# Patient Record
Sex: Male | Born: 1940 | Race: Black or African American | Hispanic: No | Marital: Married | Smoking: Never smoker
Health system: Southern US, Community
[De-identification: ages and names within clinical notes are randomized; demographics above are authoritative.]

## PROBLEM LIST (undated history)

## (undated) DIAGNOSIS — M1990 Unspecified osteoarthritis, unspecified site: Secondary | ICD-10-CM

## (undated) DIAGNOSIS — N289 Disorder of kidney and ureter, unspecified: Secondary | ICD-10-CM

## (undated) DIAGNOSIS — F419 Anxiety disorder, unspecified: Secondary | ICD-10-CM

## (undated) DIAGNOSIS — E119 Type 2 diabetes mellitus without complications: Secondary | ICD-10-CM

## (undated) DIAGNOSIS — I10 Essential (primary) hypertension: Secondary | ICD-10-CM

## (undated) DIAGNOSIS — J189 Pneumonia, unspecified organism: Secondary | ICD-10-CM

## (undated) DIAGNOSIS — F32A Depression, unspecified: Secondary | ICD-10-CM

## (undated) DIAGNOSIS — N186 End stage renal disease: Secondary | ICD-10-CM

---

## 2007-01-10 ENCOUNTER — Ambulatory Visit: Payer: Self-pay

## 2007-03-31 ENCOUNTER — Emergency Department: Payer: Self-pay | Admitting: Emergency Medicine

## 2007-04-01 ENCOUNTER — Emergency Department: Payer: Self-pay | Admitting: Emergency Medicine

## 2010-03-26 ENCOUNTER — Ambulatory Visit: Payer: Self-pay | Admitting: Gastroenterology

## 2012-01-26 ENCOUNTER — Ambulatory Visit: Payer: Self-pay | Admitting: Nephrology

## 2012-03-16 ENCOUNTER — Ambulatory Visit: Payer: Self-pay | Admitting: Gastroenterology

## 2012-03-23 ENCOUNTER — Ambulatory Visit: Payer: Self-pay | Admitting: Cardiology

## 2012-05-06 ENCOUNTER — Emergency Department: Payer: Self-pay | Admitting: Emergency Medicine

## 2012-05-06 LAB — BASIC METABOLIC PANEL
Anion Gap: 7 (ref 7–16)
Calcium, Total: 9.5 mg/dL (ref 8.5–10.1)
Chloride: 106 mmol/L (ref 98–107)
Creatinine: 2.09 mg/dL — ABNORMAL HIGH (ref 0.60–1.30)
EGFR (African American): 36 — ABNORMAL LOW
EGFR (Non-African Amer.): 31 — ABNORMAL LOW
Glucose: 112 mg/dL — ABNORMAL HIGH (ref 65–99)
Osmolality: 286 (ref 275–301)
Sodium: 139 mmol/L (ref 136–145)

## 2012-05-06 LAB — CBC WITH DIFFERENTIAL/PLATELET
Basophil #: 0.1 10*3/uL (ref 0.0–0.1)
Basophil %: 1.3 %
Eosinophil #: 0.2 10*3/uL (ref 0.0–0.7)
Lymphocyte #: 0.8 10*3/uL — ABNORMAL LOW (ref 1.0–3.6)
MCHC: 32.5 g/dL (ref 32.0–36.0)
MCV: 83 fL (ref 80–100)
Monocyte %: 8.3 %
Neutrophil %: 74.1 %
RDW: 15.3 % — ABNORMAL HIGH (ref 11.5–14.5)
WBC: 5.8 10*3/uL (ref 3.8–10.6)

## 2013-05-17 ENCOUNTER — Ambulatory Visit: Payer: Self-pay | Admitting: Gastroenterology

## 2015-03-18 ENCOUNTER — Emergency Department: Payer: Medicare PPO

## 2015-03-18 ENCOUNTER — Emergency Department
Admission: EM | Admit: 2015-03-18 | Discharge: 2015-03-18 | Disposition: A | Discharge status: Home / Self Care | Payer: Medicare PPO | Attending: Emergency Medicine | Admitting: Emergency Medicine

## 2015-03-18 ENCOUNTER — Encounter: Payer: Self-pay | Admitting: Emergency Medicine

## 2015-03-18 DIAGNOSIS — R140 Abdominal distension (gaseous): Secondary | ICD-10-CM

## 2015-03-18 DIAGNOSIS — E119 Type 2 diabetes mellitus without complications: Secondary | ICD-10-CM | POA: Diagnosis not present

## 2015-03-18 DIAGNOSIS — I10 Essential (primary) hypertension: Secondary | ICD-10-CM | POA: Diagnosis not present

## 2015-03-18 DIAGNOSIS — R109 Unspecified abdominal pain: Secondary | ICD-10-CM | POA: Diagnosis present

## 2015-03-18 DIAGNOSIS — K5900 Constipation, unspecified: Secondary | ICD-10-CM | POA: Diagnosis not present

## 2015-03-18 DIAGNOSIS — R1084 Generalized abdominal pain: Secondary | ICD-10-CM

## 2015-03-18 DIAGNOSIS — R110 Nausea: Secondary | ICD-10-CM | POA: Insufficient documentation

## 2015-03-18 HISTORY — DX: Essential (primary) hypertension: I10

## 2015-03-18 HISTORY — DX: Type 2 diabetes mellitus without complications: E11.9

## 2015-03-18 HISTORY — DX: Disorder of kidney and ureter, unspecified: N28.9

## 2015-03-18 LAB — COMPREHENSIVE METABOLIC PANEL
ALK PHOS: 54 U/L (ref 38–126)
ALT: 14 U/L — AB (ref 17–63)
AST: 19 U/L (ref 15–41)
Albumin: 4.2 g/dL (ref 3.5–5.0)
Anion gap: 8 (ref 5–15)
BUN: 36 mg/dL — AB (ref 6–20)
CHLORIDE: 112 mmol/L — AB (ref 101–111)
CO2: 22 mmol/L (ref 22–32)
CREATININE: 2.19 mg/dL — AB (ref 0.61–1.24)
Calcium: 9.6 mg/dL (ref 8.9–10.3)
GFR calc Af Amer: 32 mL/min — ABNORMAL LOW (ref >60)
GFR, EST NON AFRICAN AMERICAN: 28 mL/min — AB (ref >60)
Glucose, Bld: 130 mg/dL — ABNORMAL HIGH (ref 65–99)
Potassium: 3.8 mmol/L (ref 3.5–5.1)
Sodium: 142 mmol/L (ref 135–145)
Total Bilirubin: 0.7 mg/dL (ref 0.3–1.2)
Total Protein: 7.5 g/dL (ref 6.5–8.1)

## 2015-03-18 LAB — CBC
HCT: 35.2 % — ABNORMAL LOW (ref 40.0–52.0)
Hemoglobin: 11.3 g/dL — ABNORMAL LOW (ref 13.0–18.0)
MCH: 26.4 pg (ref 26.0–34.0)
MCHC: 32.2 g/dL (ref 32.0–36.0)
MCV: 82 fL (ref 80.0–100.0)
PLATELETS: 148 10*3/uL — AB (ref 150–440)
RBC: 4.29 MIL/uL — ABNORMAL LOW (ref 4.40–5.90)
RDW: 15.3 % — AB (ref 11.5–14.5)
WBC: 4.6 10*3/uL (ref 3.8–10.6)

## 2015-03-18 LAB — URINALYSIS COMPLETE WITH MICROSCOPIC (ARMC ONLY)
BILIRUBIN URINE: NEGATIVE
Bacteria, UA: NONE SEEN
GLUCOSE, UA: NEGATIVE mg/dL
Hgb urine dipstick: NEGATIVE
Ketones, ur: NEGATIVE mg/dL
Leukocytes, UA: NEGATIVE
Nitrite: NEGATIVE
Protein, ur: NEGATIVE mg/dL
Specific Gravity, Urine: 1.012 (ref 1.005–1.030)
pH: 5 (ref 5.0–8.0)

## 2015-03-18 LAB — LIPASE, BLOOD: LIPASE: 24 U/L (ref 11–51)

## 2015-03-18 LAB — LACTIC ACID, PLASMA: Lactic Acid, Venous: 0.7 mmol/L (ref 0.5–2.0)

## 2015-03-18 MED ORDER — METOCLOPRAMIDE HCL 10 MG PO TABS: 10.0000 mg | ORAL_TABLET | Freq: Four times a day (QID) | ORAL | Status: DC | PRN

## 2015-03-18 MED ORDER — IOHEXOL 240 MG/ML SOLN
25.0000 mL | INTRAMUSCULAR | Status: AC
  Administered 2015-03-18: 25 mL via ORAL

## 2015-03-18 MED ORDER — GI COCKTAIL ~~LOC~~
30.0000 mL | Freq: Once | ORAL | Status: AC
  Administered 2015-03-18: 30 mL via ORAL
  Filled 2015-03-18: qty 30

## 2015-03-18 MED ORDER — SODIUM CHLORIDE 0.9 % IV BOLUS (SEPSIS)
1000.0000 mL | Freq: Once | INTRAVENOUS | Status: AC
  Administered 2015-03-18: 1000 mL via INTRAVENOUS

## 2015-03-18 MED ORDER — DOCUSATE SODIUM 100 MG PO CAPS: 100.0000 mg | ORAL_CAPSULE | Freq: Every day | ORAL | Status: AC | PRN

## 2015-03-18 NOTE — ED Notes (Signed)
Warm blankets provided. Call bell at side, pt denies further needs at this time.

## 2015-03-18 NOTE — ED Notes (Signed)
Pt resting in bed, wife at the bedside, call bell in reach. Pt requests his daughter be called to get medication list.

## 2015-03-18 NOTE — ED Notes (Signed)
Pt states pain improving since triage. Pt states "i just feel bloated, like i need to pass something, maybe gas but can't."

## 2015-03-18 NOTE — ED Notes (Signed)
Pt up to restroom.

## 2015-03-18 NOTE — ED Notes (Signed)
Pt has finished ct contrast. Ct notified, pt continues to deny pain, states "i just feel bloated."

## 2015-03-18 NOTE — ED Notes (Signed)
Patient ambulatory to triage with steady gait, without difficulty or distress noted; pt reports last few days having lower abd pain with no accomp symptoms

## 2015-03-18 NOTE — Discharge Instructions (Signed)
Abdominal Pain, Adult °Many things can cause abdominal pain. Usually, abdominal pain is not caused by a disease and will improve without treatment. It can often be observed and treated at home. Your health care provider will do a physical exam and possibly order blood tests and X-rays to help determine the seriousness of your pain. However, in many cases, more time must pass before a clear cause of the pain can be found. Before that point, your health care provider may not know if you need more testing or further treatment. °HOME CARE INSTRUCTIONS °Monitor your abdominal pain for any changes. The following actions may help to alleviate any discomfort you are experiencing: °· Only take over-the-counter or prescription medicines as directed by your health care provider. °· Do not take laxatives unless directed to do so by your health care provider. °· Try a clear liquid diet (broth, tea, or water) as directed by your health care provider. Slowly move to a bland diet as tolerated. °SEEK MEDICAL CARE IF: °· You have unexplained abdominal pain. °· You have abdominal pain associated with nausea or diarrhea. °· You have pain when you urinate or have a bowel movement. °· You experience abdominal pain that wakes you in the night. °· You have abdominal pain that is worsened or improved by eating food. °· You have abdominal pain that is worsened with eating fatty foods. °· You have a fever. °SEEK IMMEDIATE MEDICAL CARE IF: °· Your pain does not go away within 2 hours. °· You keep throwing up (vomiting). °· Your pain is felt only in portions of the abdomen, such as the right side or the left lower portion of the abdomen. °· You pass bloody or black tarry stools. °MAKE SURE YOU: °· Understand these instructions. °· Will watch your condition. °· Will get help right away if you are not doing well or get worse. °  °This information is not intended to replace advice given to you by your health care provider. Make sure you discuss  any questions you have with your health care provider. °  °Document Released: 10/08/2004 Document Revised: 09/19/2014 Document Reviewed: 09/07/2012 °Elsevier Interactive Patient Education ©2016 [employer] ° °Constipation, Adult °Constipation is when a person has fewer than three bowel movements a week, has difficulty having a bowel movement, or has stools that are dry, hard, or larger than normal. As people grow older, constipation is more common. A low-fiber diet, not taking in enough fluids, and taking certain medicines may make constipation worse.  °CAUSES  °· Certain medicines, such as antidepressants, pain medicine, iron supplements, antacids, and water pills.   °· Certain diseases, such as diabetes, irritable bowel syndrome (IBS), thyroid disease, or depression.   °· Not drinking enough water.   °· Not eating enough fiber-rich foods.   °· Stress or travel.   °· Lack of physical activity or exercise.   °· Ignoring the urge to have a bowel movement.   °· Using laxatives too much.   °SIGNS AND SYMPTOMS  °· Having fewer than three bowel movements a week.   °· Straining to have a bowel movement.   °· Having stools that are hard, dry, or larger than normal.   °· Feeling full or bloated.   °· Pain in the lower abdomen.   °· Not feeling relief after having a bowel movement.   °DIAGNOSIS  °Your health care provider will take a medical history and perform a physical exam. Further testing may be done for severe constipation. Some tests may include: °· A barium enema X-ray to examine your rectum, colon, and, sometimes,   your small intestine.   °· A sigmoidoscopy to examine your lower colon.   °· A colonoscopy to examine your entire colon. °TREATMENT  °Treatment will depend on the severity of your constipation and what is causing it. Some dietary treatments include drinking more fluids and eating more fiber-rich foods. Lifestyle treatments may include regular exercise. If these diet and lifestyle recommendations do not  help, your health care provider may recommend taking over-the-counter laxative medicines to help you have bowel movements. Prescription medicines may be prescribed if over-the-counter medicines do not work.  °HOME CARE INSTRUCTIONS  °· Eat foods that have a lot of fiber, such as fruits, vegetables, whole grains, and beans. °· Limit foods high in fat and processed sugars, such as french fries, hamburgers, cookies, candies, and soda.   °· A fiber supplement may be added to your diet if you cannot get enough fiber from foods.   °· Drink enough fluids to keep your urine clear or pale yellow.   °· Exercise regularly or as directed by your health care provider.   °· Go to the restroom when you have the urge to go. Do not hold it.   °· Only take over-the-counter or prescription medicines as directed by your health care provider. Do not take other medicines for constipation without talking to your health care provider first.   °SEEK IMMEDIATE MEDICAL CARE IF:  °· You have bright red blood in your stool.   °· Your constipation lasts for more than 4 days or gets worse.   °· You have abdominal or rectal pain.   °· You have thin, pencil-like stools.   °· You have unexplained weight loss. °MAKE SURE YOU:  °· Understand these instructions. °· Will watch your condition. °· Will get help right away if you are not doing well or get worse. °  °This information is not intended to replace advice given to you by your health care provider. Make sure you discuss any questions you have with your health care provider. °  °Document Released: 09/27/2003 Document Revised: 01/19/2014 Document Reviewed: 10/10/2012 °Elsevier Interactive Patient Education ©2016 Elsevier Inc. ° °

## 2015-03-18 NOTE — ED Provider Notes (Signed)
[facility] Emergency Department Provider Note  ____________________________________________  Time seen: Approximately [id number] AM  I have reviewed the triage vital signs and the nursing notes.   HISTORY  Chief Complaint Abdominal Pain    HPI Jeffery Joseph is a 75 y.o. male who comes in today with some abdominal discomfort. The patient were to the stomach has been bothering him for the last day or 2. He has had some hard bowels as well for the same amount of time. The patient is taking some medication he reports to help decrease his potassium and he is unsure if that is causing his symptoms. He reports he does have some difficulty passing gas and reports this pain is a 10 out of 10 in intensity. When asked to describe that pain he said a simple discomfort and is not severe pain. He just feels uncomfortable and nauseous. The patient denies any vomiting or fevers. He reports he is always had a bad stomach. The patient reports that he was so uncomfortable he decided to come into the hospital for evaluation.The patient reports that the pain is in his mid abdomen.    Past Medical History  Diagnosis Date  . Hypertension   . Diabetes mellitus without complication ([place])   . Renal disorder     There are no active problems to display for this patient.   History reviewed. No pertinent past surgical history.  Current Outpatient Rx  Name  Route  Sig  Dispense  Refill  . docusate sodium (COLACE) 100 MG capsule   Oral   Take 1 capsule (100 mg total) by mouth daily as needed.   15 capsule   0   . metoCLOPramide (REGLAN) 10 MG tablet   Oral   Take 1 tablet (10 mg total) by mouth every 6 (six) hours as needed for nausea.   15 tablet   0     Allergies Review of patient's allergies indicates no known allergies.  No family history on file.  Social History Social History  Substance Use Topics  . Smoking status: Never Smoker   . Smokeless tobacco: None  . Alcohol  Use: No    Review of Systems Constitutional: No fever/chills Eyes: No visual changes. ENT: No sore throat. Cardiovascular: Denies chest pain. Respiratory: Denies shortness of breath. Gastrointestinal:  abdominal pain with nausea and constipation Genitourinary: Negative for dysuria. Musculoskeletal: Negative for back pain. Skin: Negative for rash. Neurological: Negative for headaches, focal weakness or numbness.  10-point ROS otherwise negative.  ____________________________________________   PHYSICAL EXAM:  VITAL SIGNS: ED Triage Vitals  Enc Vitals Group     BP 03/18/15 0338 163/61 mmHg     Pulse Rate 03/18/15 0338 67     Resp 03/18/15 0338 20     Temp 03/18/15 0338 98.1 F (36.7 C)     Temp Source 03/18/15 0338 Oral     SpO2 03/18/15 0338 100 %     Weight 03/18/15 0338 230 lb (104.327 kg)     Height 03/18/15 0338 6\' 5"  (1.956 m)     Head Cir --      Peak Flow --      Pain Score 03/18/15 0337 10     Pain Loc --      Pain Edu? --      Excl. in [place]? --     Constitutional: Alert and oriented. Well appearing and in mild distress. Eyes: Conjunctivae are normal. PERRL. EOMI. Head: Atraumatic. Nose: No congestion/rhinnorhea. Mouth/Throat: Mucous membranes  are moist.  Oropharynx non-erythematous. Cardiovascular: Normal rate, regular rhythm. Grossly normal heart sounds.  Good peripheral circulation. Respiratory: Normal respiratory effort.  No retractions. Lungs CTAB. Gastrointestinal: Soft and nontender. No distention. Positive bowel sounds. Musculoskeletal: No lower extremity tenderness nor edema.   Neurologic:  Normal speech and language.  Skin:  Skin is warm, dry and intact.  Psychiatric: Mood and affect are normal.   ____________________________________________   LABS (all labs ordered are listed, but only abnormal results are displayed)  Labs Reviewed  COMPREHENSIVE METABOLIC PANEL - Abnormal; Notable for the following:    Chloride 112 (*)    Glucose, Bld 130  (*)    BUN 36 (*)    Creatinine, Ser 2.19 (*)    ALT 14 (*)    GFR calc non Af Amer 28 (*)    GFR calc Af Amer 32 (*)    All other components within normal limits  CBC - Abnormal; Notable for the following:    RBC 4.29 (*)    Hemoglobin 11.3 (*)    HCT 35.2 (*)    RDW 15.3 (*)    Platelets 148 (*)    All other components within normal limits  URINALYSIS COMPLETEWITH MICROSCOPIC ([facility] ONLY) - Abnormal; Notable for the following:    Color, Urine STRAW (*)    APPearance CLEAR (*)    Squamous Epithelial / LPF 0-5 (*)    All other components within normal limits  LIPASE, BLOOD  LACTIC ACID, PLASMA   ____________________________________________  EKG  None ____________________________________________  RADIOLOGY  CT Abdomen and Pelvis: 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 Solid renal lesions cannot be excluded. Consider follow up with MRI in the elective setting. There is a small follow up with MRI in the elective setting. There is a small umbilical hernia containing bowel but without proximal obstruction. ____________________________________________   PROCEDURES  Procedure(s) performed: None  Critical Care performed: No  ____________________________________________   INITIAL IMPRESSION / ASSESSMENT AND PLAN / ED COURSE  Pertinent labs & imaging results that were available during my care of the patient were reviewed by me and considered in my medical decision making (see chart for details).  This is a 75 year old male who comes into the hospital today with some abdominal discomfort. He reports he feels as though he needs to pass gas but he is not having some severe pain. I did perform a CT scan that only shows an umbilical hernia without any acute process. I will give the patient a GI cocktail and reassess his discomfort.  The patient reports that he feels bloated but he is not having any pain at this time. I feel the patient's  symptoms is due to likely his medication but also some constipation. I will give the patient a prescription for Colace as well as for Reglan to help improve his GI transit time. The patient will be discharged home to follow-up with his primary care physician. ____________________________________________   FINAL CLINICAL IMPRESSION(S) / ED DIAGNOSES  Final diagnoses:  Generalized abdominal pain  Abdominal bloating  Constipation, unspecified constipation type      Loney Hering, MD 03/18/15 [phone]

## 2015-03-18 NOTE — ED Notes (Signed)
Pt sipping po contrast.

## 2015-03-18 NOTE — ED Notes (Signed)
Report to tiffany, rn. 

## 2015-03-18 NOTE — ED Notes (Addendum)
Pt states bilateral lower quadrant pain for "days" that is worse since yesterday. Pt states has had nausea. Pt states last bowel movement was yesterday and "hard". Pt denies known fever, abd is non tender to palpation. Pt states is passing flatus. Pt denies dysuria, hematuria or foul odor to urine. Cms intact to lower extremities. No pulsitile mass noted to abd.

## 2017-04-06 ENCOUNTER — Telehealth: Payer: Self-pay | Admitting: Infectious Diseases

## 2017-04-06 NOTE — Telephone Encounter (Signed)
error 

## 2017-04-25 IMAGING — CT CT ABD-PELV W/O CM
1 of 2 series · 15 of 32 positions shown, 19 images · non-contrast
Comparison: 01/26/2012

CLINICAL DATA: Bilateral lower quadrant pain for days, worse since
yesterday. Nausea.

EXAM:
CT ABDOMEN AND PELVIS WITHOUT CONTRAST
TECHNIQUE: Multidetector CT imaging of the abdomen and pelvis was performed
following the standard protocol without IV contrast.

[Series 2: routine abd pel without · axial · non-contrast · 0.82mm/px · z∈[-568,-103]mm · 15 of 103 slices shown, 19 images]
[im 5/103  soft-tissue]
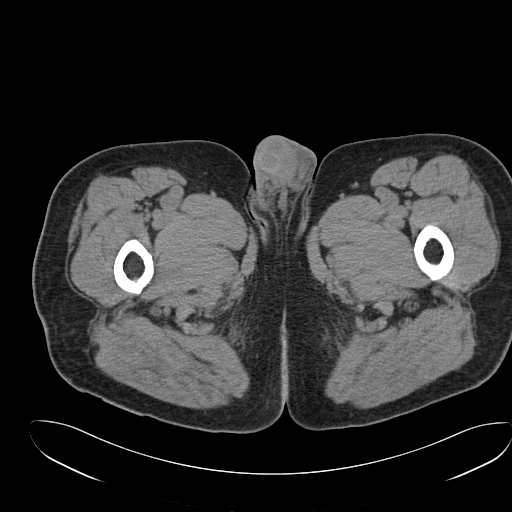
[im 5/103  bone]
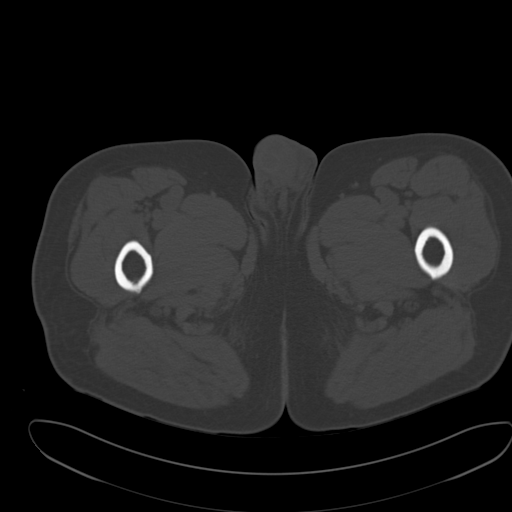
[im 13/103  soft-tissue]
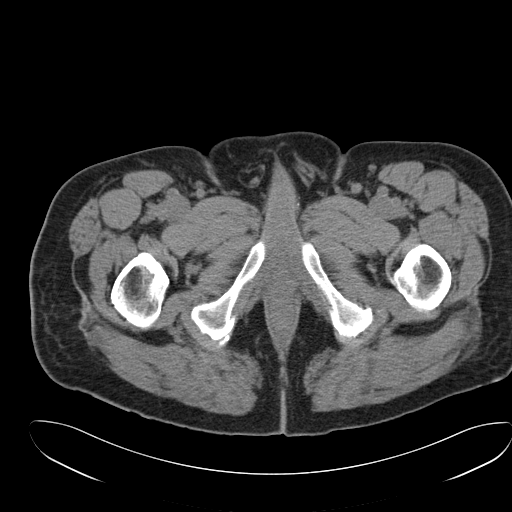
[im 21/103  soft-tissue]
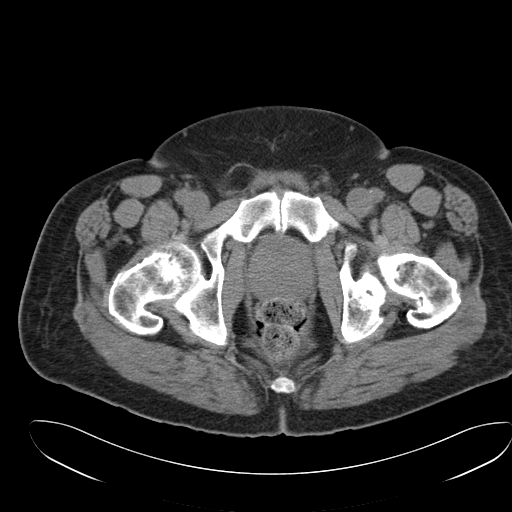
[im 29/103  soft-tissue]
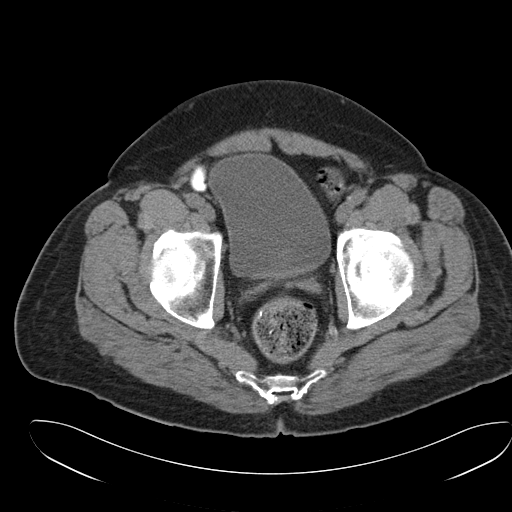
[im 37/103  soft-tissue]
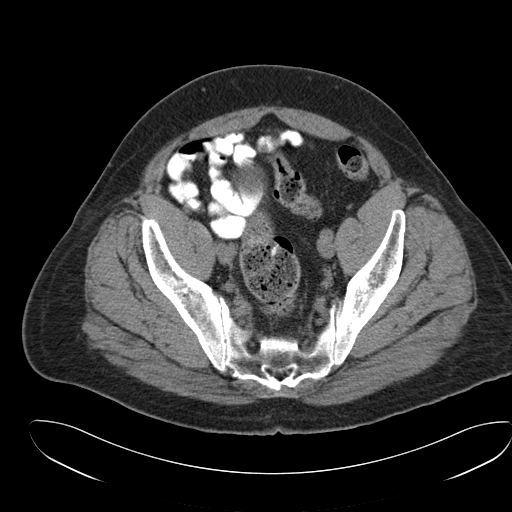
[im 45/103  soft-tissue]
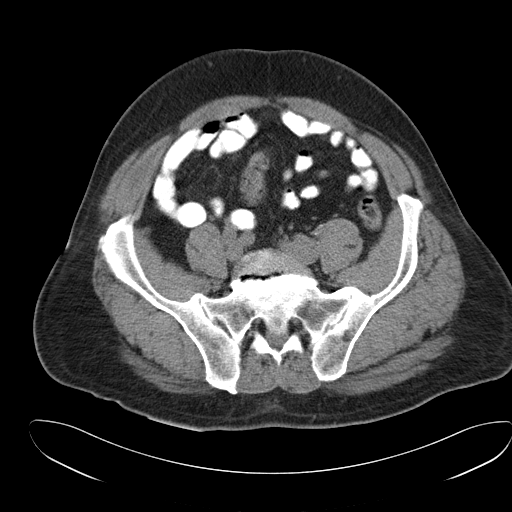
[im 54/103  soft-tissue]
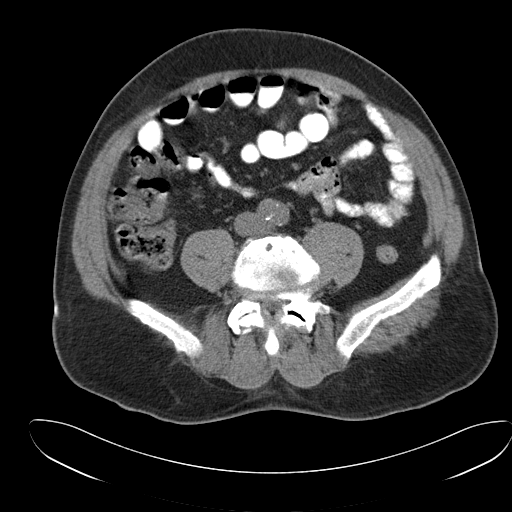
[im 58/103  soft-tissue]
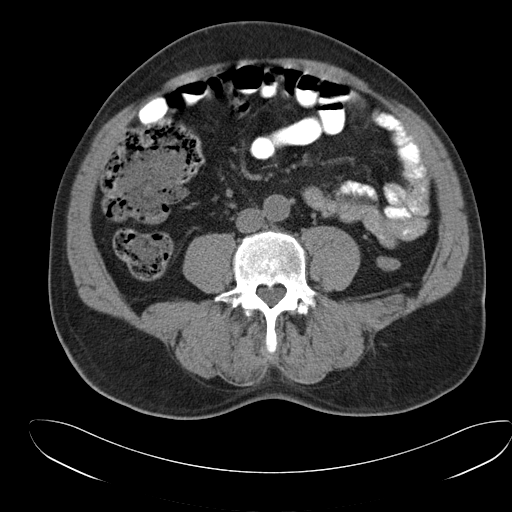
[im 66/103  soft-tissue]
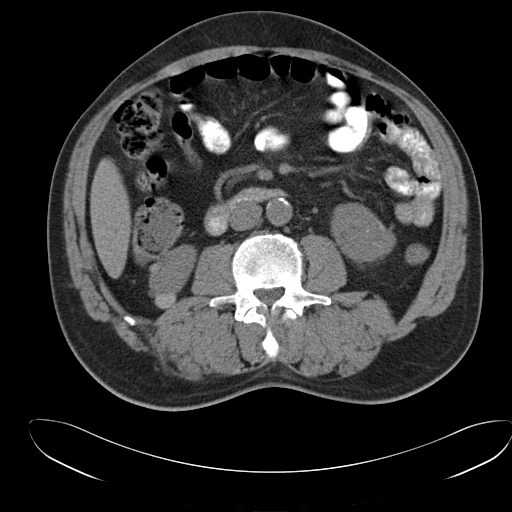
[im 66/103  bone]
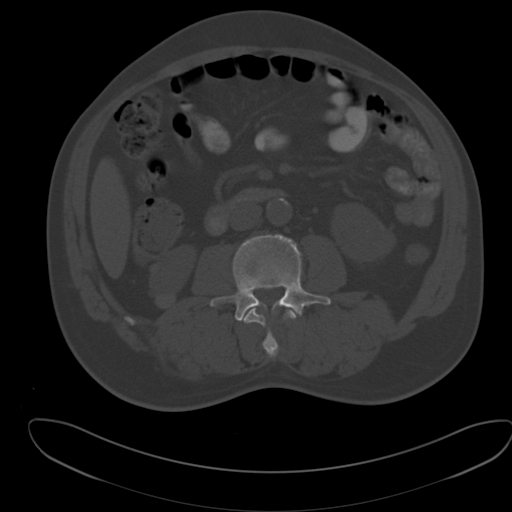
[im 74/103  soft-tissue]
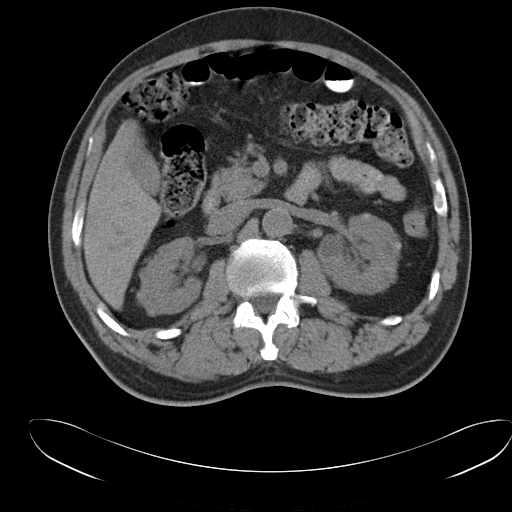
[im 82/103  soft-tissue]
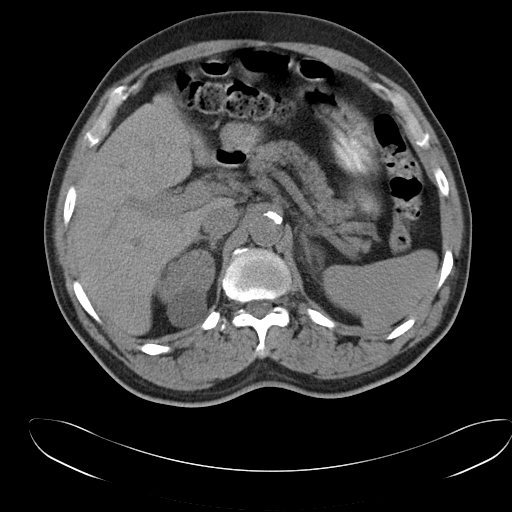
[im 86/103  lung]
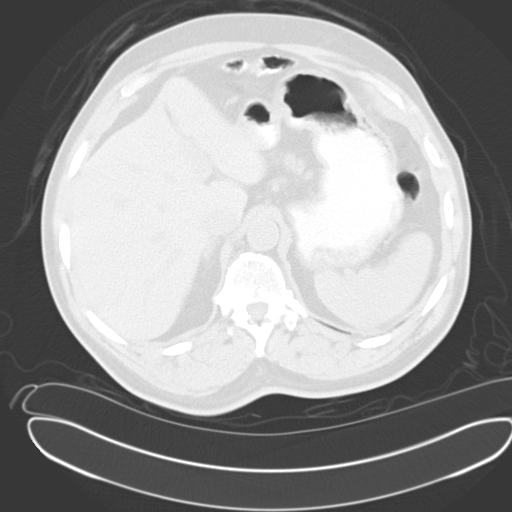
[im 90/103  soft-tissue]
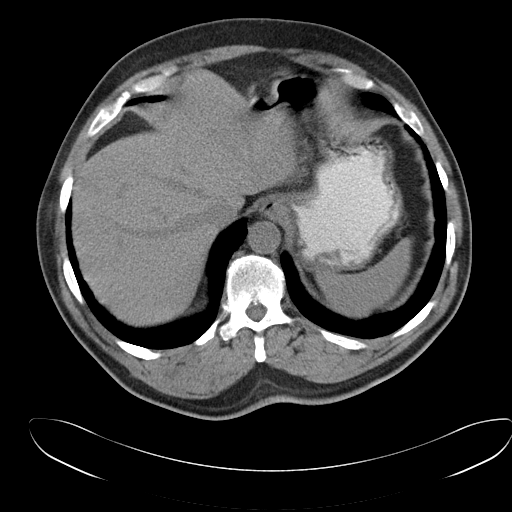
[im 90/103  lung]
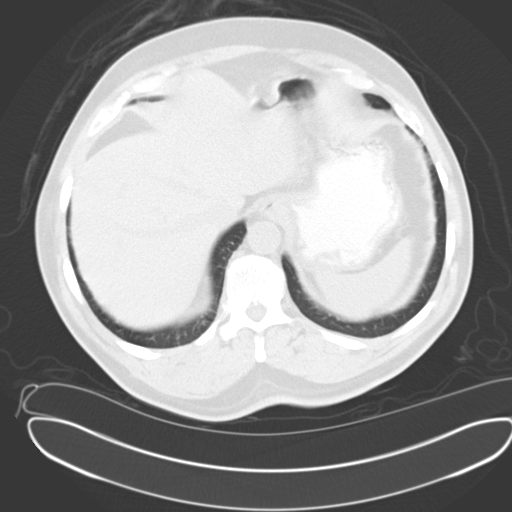
[im 94/103  lung]
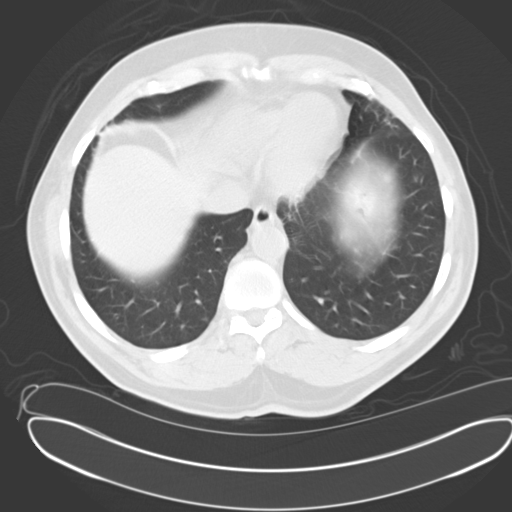
[im 98/103  soft-tissue]
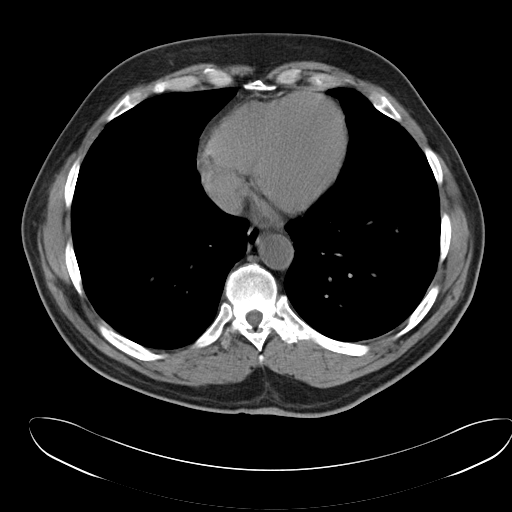
[im 98/103  lung]
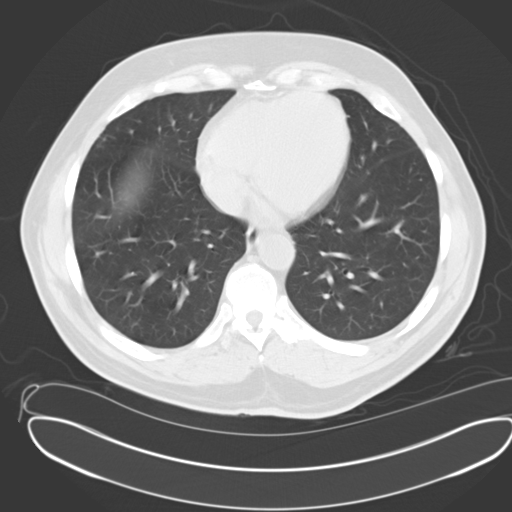

[15 of 32 positions shown; findings below may reference images not displayed]

FINDINGS: The lung bases are clear.  Coronary artery calcifications.

Evaluation of solid organs and vascular structures is limited
without IV contrast material.

The unenhanced appearance of the liver, spleen, gallbladder,
pancreas, adrenal glands, abdominal aorta, inferior vena cava, and
retroperitoneal lymph nodes is unremarkable. There are multiple
bilateral renal mass lesions, with varying densities, likely
representing cysts and hemorrhagic cysts. A larger cyst previously
demonstrated in the left kidney has deflated since the previous
study. Solid mass lesions in the kidneys cannot be excluded.
Consider MRI for further evaluation in the elective setting. No
hydronephrosis.

Stomach and small bowel are unremarkable. Scattered stool in the
colon. No colonic distention or wall thickening. No free air or free
fluid in the abdomen.

Pelvis: Small umbilical hernia containing bowel but without proximal
obstruction. Bladder wall is not thickened. Diffuse enlargement of
prostate gland, measuring 6 cm diameter. Small right inguinal hernia
containing fat. Appendix is normal. Degenerative changes in the
lumbar spine and hips. No destructive bone lesions.
IMPRESSION: Multiple bilateral renal mass lesions likely representing cysts and
hemorrhagic cysts. Solid renal lesions cannot be excluded. Consider
follow-up with MRI in the elective setting. There is a small
umbilical hernia containing bowel but without proximal obstruction.

## 2018-10-27 DIAGNOSIS — I10 Essential (primary) hypertension: Secondary | ICD-10-CM | POA: Insufficient documentation

## 2019-01-03 ENCOUNTER — Ambulatory Visit: Payer: Medicare PPO | Attending: Internal Medicine

## 2019-01-03 DIAGNOSIS — Z20822 Contact with and (suspected) exposure to covid-19: Secondary | ICD-10-CM

## 2019-01-05 LAB — NOVEL CORONAVIRUS, NAA: SARS-CoV-2, NAA: NOT DETECTED

## 2019-02-20 ENCOUNTER — Ambulatory Visit: Payer: Medicare PPO

## 2019-08-11 ENCOUNTER — Ambulatory Visit (INDEPENDENT_AMBULATORY_CARE_PROVIDER_SITE_OTHER): Payer: Medicare PPO | Admitting: Urology

## 2019-08-11 ENCOUNTER — Encounter: Payer: Self-pay | Admitting: Urology

## 2019-08-11 ENCOUNTER — Other Ambulatory Visit: Payer: Self-pay

## 2019-08-11 VITALS — BP 153/56 | HR 66 | Ht 77.0 in | Wt 243.2 lb

## 2019-08-11 DIAGNOSIS — N400 Enlarged prostate without lower urinary tract symptoms: Secondary | ICD-10-CM

## 2019-08-11 DIAGNOSIS — N401 Enlarged prostate with lower urinary tract symptoms: Secondary | ICD-10-CM

## 2019-08-11 LAB — URINALYSIS, COMPLETE
Bilirubin, UA: NEGATIVE
Glucose, UA: NEGATIVE
Ketones, UA: NEGATIVE
Leukocytes,UA: NEGATIVE
Nitrite, UA: NEGATIVE
RBC, UA: NEGATIVE
Specific Gravity, UA: 1.015 (ref 1.005–1.030)
Urobilinogen, Ur: 0.2 mg/dL (ref 0.2–1.0)
pH, UA: 5.5 (ref 5.0–7.5)

## 2019-08-11 LAB — MICROSCOPIC EXAMINATION: Bacteria, UA: NONE SEEN

## 2019-08-11 LAB — BLADDER SCAN AMB NON-IMAGING: Scan Result: 52

## 2019-08-12 ENCOUNTER — Encounter: Payer: Self-pay | Admitting: Urology

## 2019-08-12 DIAGNOSIS — N401 Enlarged prostate with lower urinary tract symptoms: Secondary | ICD-10-CM | POA: Insufficient documentation

## 2019-08-12 MED ORDER — TAMSULOSIN HCL 0.4 MG PO CAPS: 0.4000 mg | ORAL_CAPSULE | Freq: Every day | ORAL | 3 refills | Status: DC

## 2019-08-12 MED ORDER — FINASTERIDE 5 MG PO TABS: 5.0000 mg | ORAL_TABLET | Freq: Every day | ORAL | 3 refills | Status: DC

## 2019-08-12 NOTE — Progress Notes (Signed)
   08/11/2019 10:52 PM   MAJD TISSUE 04-Aug-1940 [id number]  Primary care provider: Leonel Ramsay, MD [place],  [place] [address]  Chief Complaint  Patient presents with  . Benign Prostatic Hypertrophy    HPI: Jeffery Joseph is a 79 y.o. male who presents to establish local urologic care.   Previously followed by Dr. Jacqlyn Larsen in [place] and [facility] Dr. Wynetta Emery in October 2020  History of transient elevated PSA which Dr. Jacqlyn Larsen was following annually and Dr. Wynetta Emery recommended discontinuing screening since PSA has been stable for 5 years  Stable lower urinary tract symptoms which are not bothersome  On finasteride and tamsulosin  Denies dysuria or gross hematuria  Denies flank, abdominal or pelvic pain    PMH: Past Medical History:  Diagnosis Date  . Diabetes mellitus without complication ([place])   . Hypertension   . Renal disorder     Surgical History: History reviewed. No pertinent surgical history.  Home Medications:  Allergies as of 08/11/2019   No Known Allergies     Medication List       Accurate as of August 11, 2019 11:59 PM. If you have any questions, ask your nurse or doctor.        amLODipine 10 MG tablet Commonly known as: NORVASC Take 10 mg by mouth daily.   cholecalciferol 1000 units tablet Commonly known as: VITAMIN D Take 2,000 Units by mouth daily.   clindamycin-tretinoin gel Commonly known as: ZIANA Apply topically at bedtime.   finasteride 5 MG tablet Commonly known as: PROSCAR Take 5 mg by mouth daily.   hydrALAZINE 50 MG tablet Commonly known as: APRESOLINE Take 50 mg by mouth 3 (three) times daily.   losartan 50 MG tablet Commonly known as: COZAAR Take 50 mg by mouth 2 (two) times daily.   metoCLOPramide 10 MG tablet Commonly known as: REGLAN Take 1 tablet (10 mg total) by mouth every 6 (six) hours as needed for nausea.   oxazepam 10 MG capsule Commonly known as: SERAX Take 10 mg by mouth 2  (two) times daily.   tamsulosin 0.4 MG Caps capsule Commonly known as: FLOMAX Take 0.4 mg by mouth daily.       Allergies: No Known Allergies  Family History: History reviewed. No pertinent family history.  Social History:  reports that he has never smoked. He has never used smokeless tobacco. He reports that he does not drink alcohol. No history on file for drug use.   Physical Exam: BP (!) 153/56   Pulse 66   Ht 6\' 5"  (1.956 m)   Wt (!) 243 lb 3.2 oz (110.3 kg)   BMI 28.84 kg/m   Constitutional:  Alert and oriented, No acute distress. HEENT: [place] AT, moist mucus membranes.  Trachea midline, no masses. Cardiovascular: No clubbing, cyanosis, or edema. Respiratory: Normal respiratory effort, no increased work of breathing. GI: Abdomen is soft, nontender, nondistended, no abdominal masses GU: Prostate 50 g, smooth without nodules Lymph: No cervical or inguinal lymphadenopathy. Skin: No rashes, bruises or suspicious lesions. Neurologic: Grossly intact, no focal deficits, moving all 4 extremities. Psychiatric: Normal mood and affect.   Assessment & Plan:    1.  BPH with LUTS  Stable on tamsulosin/finasteride  Refill sent to pharmacy  PVR by bladder scan 52 mL  Continue annual follow-up   Abbie Sons, MD  [facility] [address] [place], [place] [address] [phone]

## 2020-08-12 ENCOUNTER — Ambulatory Visit: Payer: Self-pay | Admitting: Urology

## 2020-08-14 ENCOUNTER — Other Ambulatory Visit: Payer: Self-pay

## 2020-08-14 ENCOUNTER — Ambulatory Visit: Payer: Medicare PPO | Admitting: Urology

## 2020-08-14 ENCOUNTER — Encounter: Payer: Self-pay | Admitting: Urology

## 2020-08-14 VITALS — BP 176/71 | HR 51 | Ht 77.0 in | Wt 240.0 lb

## 2020-08-14 DIAGNOSIS — N401 Enlarged prostate with lower urinary tract symptoms: Secondary | ICD-10-CM | POA: Diagnosis not present

## 2020-08-14 LAB — BLADDER SCAN AMB NON-IMAGING: SCA Result: 157

## 2020-08-14 NOTE — Progress Notes (Signed)
08/14/2020 10:17 AM   Williemae Area 1940/01/30 [id number]  Referring provider: Leonel Ramsay, MD [place],  [place] [id number]  Chief Complaint  Patient presents with   Benign Prostatic Hypertrophy    Urologic history: 1.  BPH with LUTS Combination therapy tamsulosin/finasteride   HPI: 80 y.o. male presents for annual follow-up.  No problems since last years visit Remains on tamsulosin/finasteride Denies dysuria, gross hematuria Denies flank, abdominal or pelvic pain   PMH: Past Medical History:  Diagnosis Date   Diabetes mellitus without complication ([place])    Hypertension    Renal disorder     Surgical History: No past surgical history on file.  Home Medications:  Allergies as of 08/14/2020   No Known Allergies      Medication List        Accurate as of August 14, 2020 10:17 AM. If you have any questions, ask your nurse or doctor.          STOP taking these medications    clindamycin-tretinoin gel Commonly known as: ZIANA Stopped by: Abbie Sons, MD   losartan 50 MG tablet Commonly known as: COZAAR Stopped by: Abbie Sons, MD       TAKE these medications    amLODipine 10 MG tablet Commonly known as: NORVASC Take 10 mg by mouth daily.   cholecalciferol 1000 units tablet Commonly known as: VITAMIN D Take 2,000 Units by mouth daily.   finasteride 5 MG tablet Commonly known as: PROSCAR Take 1 tablet (5 mg total) by mouth daily.   hydrALAZINE 50 MG tablet Commonly known as: APRESOLINE Take 50 mg by mouth 3 (three) times daily.   metoCLOPramide 10 MG tablet Commonly known as: REGLAN Take 1 tablet (10 mg total) by mouth every 6 (six) hours as needed for nausea.   oxazepam 10 MG capsule Commonly known as: SERAX Take 10 mg by mouth 2 (two) times daily.   tamsulosin 0.4 MG Caps capsule Commonly known as: FLOMAX Take 1 capsule (0.4 mg total) by mouth daily.        Allergies: No Known  Allergies  Family History: No family history on file.  Social History:  reports that he has never smoked. He has never used smokeless tobacco. He reports that he does not drink alcohol. No history on file for drug use.   Physical Exam: BP (!) 176/71   Pulse (!) 51   Ht '6\' 5"'$  (1.956 m)   Wt 240 lb (108.9 kg)   BMI 28.46 kg/m   Constitutional:  Alert and oriented, No acute distress. HEENT: [place] AT, moist mucus membranes.  Trachea midline, no masses. Cardiovascular: No clubbing, cyanosis, or edema. Respiratory: Normal respiratory effort, no increased work of breathing. GI: Abdomen is soft, nontender, nondistended, no abdominal masses GU: Desires to go to every other year DRE   Assessment & Plan:    1. Benign prostatic hyperplasia with LUTS Stable voiding symptoms on tamsulosin/finasteride His medications are refilled by Dr. Ola Spurr Bladder scan PVR today 157 mL which is slightly increased from last year Continue annual follow-up  2.  Prostate cancer screening His daughter asked when his last PSA was checked and it was June 2019 We discussed the current prostate cancer screening guidelines which are an annual PSA/DRE between the ages of 72-69.  We discussed the low incidence of clinically significant prostate cancer after age 98  Abbie Sons, MD  [facility] [address], [place], [place]  [address] [phone]   

## 2020-10-21 ENCOUNTER — Other Ambulatory Visit: Payer: Self-pay | Admitting: Nephrology

## 2020-10-21 DIAGNOSIS — D631 Anemia in chronic kidney disease: Secondary | ICD-10-CM

## 2020-10-21 DIAGNOSIS — N189 Chronic kidney disease, unspecified: Secondary | ICD-10-CM

## 2020-10-21 DIAGNOSIS — N184 Chronic kidney disease, stage 4 (severe): Secondary | ICD-10-CM

## 2020-10-21 DIAGNOSIS — R809 Proteinuria, unspecified: Secondary | ICD-10-CM

## 2020-11-01 ENCOUNTER — Other Ambulatory Visit: Payer: Self-pay

## 2020-11-01 ENCOUNTER — Ambulatory Visit
Admission: RE | Admit: 2020-11-01 | Discharge: 2020-11-01 | Disposition: A | Discharge status: Home / Self Care | Payer: Medicare PPO | Source: Ambulatory Visit | Attending: Nephrology | Admitting: Nephrology

## 2020-11-01 DIAGNOSIS — N184 Chronic kidney disease, stage 4 (severe): Secondary | ICD-10-CM | POA: Diagnosis present

## 2020-11-01 DIAGNOSIS — N189 Chronic kidney disease, unspecified: Secondary | ICD-10-CM | POA: Diagnosis present

## 2020-11-01 DIAGNOSIS — R809 Proteinuria, unspecified: Secondary | ICD-10-CM | POA: Diagnosis present

## 2020-11-01 DIAGNOSIS — D631 Anemia in chronic kidney disease: Secondary | ICD-10-CM | POA: Diagnosis present

## 2021-08-14 ENCOUNTER — Encounter: Payer: Self-pay | Admitting: Urology

## 2021-08-14 ENCOUNTER — Ambulatory Visit: Payer: Medicare PPO | Admitting: Urology

## 2021-08-14 ENCOUNTER — Ambulatory Visit: Payer: Self-pay | Admitting: Urology

## 2021-08-14 VITALS — BP 151/63 | HR 53 | Ht 77.0 in | Wt 246.0 lb

## 2021-08-14 DIAGNOSIS — N401 Enlarged prostate with lower urinary tract symptoms: Secondary | ICD-10-CM

## 2021-08-14 LAB — BLADDER SCAN AMB NON-IMAGING: Scan Result: 45

## 2021-08-14 NOTE — Progress Notes (Signed)
   08/14/2021 1:10 PM   Jeffery Joseph February 11, 1940 [id number]  Referring provider: Leonel Ramsay, MD [place],  [place] [address]  Chief Complaint  Patient presents with   Benign Prostatic Hypertrophy    Urologic history: 1.  BPH with LUTS Combination therapy tamsulosin/finasteride   HPI: 81 y.o. male presents for annual follow-up.  No problems since last years visit Remains on tamsulosin/finasteride Denies dysuria, gross hematuria Denies flank, abdominal or pelvic pain   PMH: Past Medical History:  Diagnosis Date   Diabetes mellitus without complication ([facility])    Hypertension    Renal disorder     Surgical History: No past surgical history on file.  Home Medications:  Allergies as of 08/14/2021   No Known Allergies      Medication List        Accurate as of August 14, 2021  1:10 PM. If you have any questions, ask your nurse or doctor.          ALPRAZolam 0.5 MG tablet Commonly known as: XANAX Take by mouth.   amLODipine 10 MG tablet Commonly known as: NORVASC Take 10 mg by mouth daily.   cholecalciferol 1000 units tablet Commonly known as: VITAMIN D Take 2,000 Units by mouth daily.   enalapril 20 MG tablet Commonly known as: VASOTEC Take 20 mg by mouth 2 (two) times daily.   finasteride 5 MG tablet Commonly known as: PROSCAR Take 1 tablet (5 mg total) by mouth daily.   furosemide 40 MG tablet Commonly known as: LASIX Take by mouth.   glipiZIDE 5 MG 24 hr tablet Commonly known as: GLUCOTROL XL Take by mouth.   hydrALAZINE 50 MG tablet Commonly known as: APRESOLINE Take 50 mg by mouth 3 (three) times daily.   metoCLOPramide 10 MG tablet Commonly known as: REGLAN Take 1 tablet (10 mg total) by mouth every 6 (six) hours as needed for nausea.   metoprolol tartrate 25 MG tablet Commonly known as: LOPRESSOR Take 1 tablet by mouth 2 (two) times daily.   oxazepam 10 MG capsule Commonly known as: SERAX Take 10 mg  by mouth 2 (two) times daily.   tamsulosin 0.4 MG Caps capsule Commonly known as: FLOMAX Take 1 capsule (0.4 mg total) by mouth daily.        Allergies: No Known Allergies  Family History: No family history on file.  Social History:  reports that he has never smoked. He has never used smokeless tobacco. He reports that he does not drink alcohol. No history on file for drug use.   Physical Exam: BP (!) 151/63   Pulse (!) 53   Ht 6\' 5"  (1.956 m)   Wt 246 lb (111.6 kg)   BMI 29.17 kg/m   Constitutional:  Alert and oriented, No acute distress. HEENT: Man AT, moist mucus membranes.  Trachea midline, no masses. Cardiovascular: No clubbing, cyanosis, or edema. Respiratory: Normal respiratory effort, no increased work of breathing. GI: Abdomen is soft, nontender, nondistended, no abdominal masses GU: Desires to go to every other year D[facility]   Assessment & Plan:    1. Benign prostatic hyperplasia with LUTS Stable voiding symptoms on tamsulosin/finasteride His medications are refilled by Dr. Ola Spurr [facility] today 45 mL (157 mL last year) Follow-up 1 year with [facility]   Abbie Sons, MD  [facility] [address] [place], [place] [address] [phone]

## 2021-08-14 NOTE — Progress Notes (Signed)
b

## 2021-08-15 ENCOUNTER — Ambulatory Visit: Payer: Medicare PPO | Admitting: Urology

## 2021-10-16 ENCOUNTER — Other Ambulatory Visit (INDEPENDENT_AMBULATORY_CARE_PROVIDER_SITE_OTHER): Payer: Self-pay

## 2021-10-16 ENCOUNTER — Encounter (INDEPENDENT_AMBULATORY_CARE_PROVIDER_SITE_OTHER): Payer: Self-pay

## 2021-10-16 ENCOUNTER — Encounter (INDEPENDENT_AMBULATORY_CARE_PROVIDER_SITE_OTHER): Payer: Self-pay | Admitting: Nurse Practitioner

## 2021-10-21 ENCOUNTER — Other Ambulatory Visit (INDEPENDENT_AMBULATORY_CARE_PROVIDER_SITE_OTHER): Payer: Self-pay | Admitting: Nurse Practitioner

## 2021-10-21 DIAGNOSIS — N184 Chronic kidney disease, stage 4 (severe): Secondary | ICD-10-CM

## 2021-10-22 ENCOUNTER — Ambulatory Visit (INDEPENDENT_AMBULATORY_CARE_PROVIDER_SITE_OTHER): Payer: Medicare PPO

## 2021-10-22 ENCOUNTER — Ambulatory Visit (INDEPENDENT_AMBULATORY_CARE_PROVIDER_SITE_OTHER): Payer: Medicare PPO | Admitting: Nurse Practitioner

## 2021-10-22 ENCOUNTER — Encounter (INDEPENDENT_AMBULATORY_CARE_PROVIDER_SITE_OTHER): Payer: Self-pay | Admitting: Nurse Practitioner

## 2021-10-22 VITALS — BP 120/84 | HR 54 | Resp 18 | Ht 77.0 in | Wt 255.8 lb

## 2021-10-22 DIAGNOSIS — N184 Chronic kidney disease, stage 4 (severe): Secondary | ICD-10-CM

## 2021-10-23 ENCOUNTER — Encounter (INDEPENDENT_AMBULATORY_CARE_PROVIDER_SITE_OTHER): Payer: Self-pay | Admitting: Nurse Practitioner

## 2021-10-23 NOTE — Progress Notes (Signed)
Subjective:    Patient ID: Jeffery Joseph, male    DOB: 05/08/1940, 81 y.o.   MRN: [id number] Chief Complaint  Patient presents with   Establish Care    Referred by Dr Holley Raring    The patient is seen for evaluation for dialysis access. 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 The patient's most recent creatinine clearance is less than 20. The patient volume status has not yet become an issue. Patient's blood pressures been relatively well controlled. There are mild uremic symptoms which appear to be relatively well tolerated at this time.  The patient notes the kidney problem has been present for a long time and has been progressively getting worse.  The patient is followed by nephrology.    The patient is right-handed.  The patient has been considering the various methods of dialysis and wishes to proceed with hemodialysis and therefore creation of AV access.  The patient denies amaurosis fugax or recent TIA symptoms. There are no recent neurological changes noted. There is no history of DVT, PE or superficial thrombophlebitis. No recent episodes of angina or shortness of breath documented.    Today noninvasive studies show adequate access for creation of a left brachiocephalic AV fistula    Review of Systems  Musculoskeletal:  Positive for gait problem.  All other systems reviewed and are negative.      Objective:   Physical Exam Vitals reviewed.  HENT:     Head: Normocephalic.  Cardiovascular:     Rate and Rhythm: Normal rate.     Pulses:          Radial pulses are 2+ on the right side and 2+ on the left side.  Pulmonary:     Effort: Pulmonary effort is normal.  Skin:    General: Skin is warm and dry.  Neurological:     Mental Status: He is alert and oriented to person, place, and time.     Gait: Gait abnormal.  Psychiatric:        Mood and Affect: Mood normal.        Behavior: Behavior normal.        Thought Content: Thought  content normal.        Judgment: Judgment normal.     BP 120/84 (BP Location: Left Arm)   Pulse (!) 54   Resp 18   Ht 6\' 5"  (1.956 m)   Wt 255 lb 12.8 oz (116 kg)   BMI 30.33 kg/m   Past Medical History:  Diagnosis Date   Diabetes mellitus without complication (HCC)    Hypertension    Renal disorder     Social History   Socioeconomic History   Marital status: Married    Spouse name: Not on file   Number of children: Not on file   Years of education: Not on file   Highest education level: Not on file  Occupational History   Not on file  Tobacco Use   Smoking status: Never   Smokeless tobacco: Never  Substance and Sexual Activity   Alcohol use: No   Drug use: Not on file   Sexual activity: Not on file  Other Topics Concern   Not on file  Social History Narrative   Not on file   Social Determinants of Health   Financial Resource Strain: Not on file  Food Insecurity: Not on file  Transportation Needs: Not on file  Physical Activity: Not on file  Stress: Not on file  Social Connections: Not on file  Intimate Partner Violence: Not on file    History reviewed. No pertinent surgical history.  History reviewed. No pertinent family history.  No Known Allergies     Latest Ref Rng & Units 03/18/2015    3:41 AM 05/06/2012    8:43 AM  CBC  WBC 3.8 - 10.6 K/uL 4.6  5.8   Hemoglobin 13.0 - 18.0 g/dL 11.3  11.5   Hematocrit 40.0 - 52.0 % 35.2  35.5   Platelets 150 - 440 K/uL 148  146       CMP     Component Value Date/Time   NA 142 03/18/2015 0341   NA 139 05/06/2012 0843   K 3.8 03/18/2015 0341   K 4.1 05/06/2012 0843   CL 112 (H) 03/18/2015 0341   CL 106 05/06/2012 0843   CO2 22 03/18/2015 0341   CO2 26 05/06/2012 0843   GLUCOSE 130 (H) 03/18/2015 0341   GLUCOSE 112 (H) 05/06/2012 0843   BUN 36 (H) 03/18/2015 0341   BUN 33 (H) 05/06/2012 0843   CREATININE 2.19 (H) 03/18/2015 0341   CREATININE 2.09 (H) 05/06/2012 0843   CALCIUM 9.6 03/18/2015 0341    CALCIUM 9.5 05/06/2012 0843   PROT 7.5 03/18/2015 0341   ALBUMIN 4.2 03/18/2015 0341   AST 19 03/18/2015 0341   ALT 14 (L) 03/18/2015 0341   ALKPHOS 54 03/18/2015 0341   BILITOT 0.7 03/18/2015 0341   GFRNONAA 28 (L) 03/18/2015 0341   GFRNONAA 31 (L) 05/06/2012 0843   GFRAA 32 (L) 03/18/2015 0341   GFRAA 36 (L) 05/06/2012 0843     No results found.     Assessment & Plan:   1. Chronic kidney disease, stage 4 (severe) (HCC) Recommend:  At this time the patient does not have appropriate extremity access for dialysis  Patient should have a left brachial cephalic AV fistula created.  The risks, benefits and alternative therapies were reviewed in detail with the patient.  All questions were answered.  The patient agrees to proceed with surgery.   The patient will follow up with me in the office after the surgery.    Current Outpatient Medications on File Prior to Visit  Medication Sig Dispense Refill   ALPRAZolam (XANAX) 0.5 MG tablet Take by mouth.     amLODipine (NORVASC) 10 MG tablet Take 10 mg by mouth daily.     aspirin EC 81 MG tablet Take by mouth.     Cholecalciferol 50 MCG (2000 UT) TABS Take by mouth.     Docusate Sodium (DSS) 100 MG CAPS Take by mouth.     donepezil (ARICEPT) 10 MG tablet Take by mouth.     enalapril (VASOTEC) 20 MG tablet Take 20 mg by mouth 2 (two) times daily.     finasteride (PROSCAR) 5 MG tablet Take 1 tablet (5 mg total) by mouth daily. 90 tablet 3   glipiZIDE (GLUCOTROL XL) 5 MG 24 hr tablet Take by mouth.     hydrALAZINE (APRESOLINE) 50 MG tablet Take 50 mg by mouth 3 (three) times daily.     metoprolol tartrate (LOPRESSOR) 25 MG tablet Take 1 tablet by mouth 2 (two) times daily.     Patiromer Sorbitex Calcium 16.8 g PACK Take by mouth.     tamsulosin (FLOMAX) 0.4 MG CAPS capsule Take 1 capsule (0.4 mg total) by mouth daily. 90 capsule 3   metoCLOPramide (REGLAN) 10 MG tablet Take 1 tablet (10 mg total) by mouth every  6 (six) hours as  needed for nausea. 15 tablet 0   No current facility-administered medications on file prior to visit.    There are no Patient Instructions on file for this visit. No follow-ups on file.   Kris Hartmann, NP

## 2021-10-23 NOTE — H&P (View-Only) (Signed)
Subjective:    Patient ID: Jeffery Joseph, male    DOB: [birth year], 81 y.o.   MRN: [id number] Chief Complaint  Patient presents with   Establish Care    Referred by Dr Holley Raring    The patient is seen for evaluation for dialysis access. 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 The patient's most recent creatinine clearance is less than 20. The patient volume status has not yet become an issue. Patient's blood pressures been relatively well controlled. There are mild uremic symptoms which appear to be relatively well tolerated at this time.  The patient notes the kidney problem has been present for a long time and has been progressively getting worse.  The patient is followed by nephrology.    The patient is right-handed.  The patient has been considering the various methods of dialysis and wishes to proceed with hemodialysis and therefore creation of AV access.  The patient denies amaurosis fugax or recent TIA symptoms. There are no recent neurological changes noted. There is no history of DVT, PE or superficial thrombophlebitis. No recent episodes of angina or shortness of breath documented.    Today noninvasive studies show adequate access for creation of a left brachiocephalic AV fistula    Review of Systems  Musculoskeletal:  Positive for gait problem.  All other systems reviewed and are negative.      Objective:   Physical Exam Vitals reviewed.  HENT:     Head: Normocephalic.  Cardiovascular:     Rate and Rhythm: Normal rate.     Pulses:          Radial pulses are 2+ on the right side and 2+ on the left side.  Pulmonary:     Effort: Pulmonary effort is normal.  Skin:    General: Skin is warm and dry.  Neurological:     Mental Status: He is alert and oriented to person, place, and time.     Gait: Gait abnormal.  Psychiatric:        Mood and Affect: Mood normal.        Behavior: Behavior normal.        Thought Content: Thought  content normal.        Judgment: Judgment normal.     BP 120/84 (BP Location: Left Arm)   Pulse (!) 54   Resp 18   Ht 6\' 5"  (1.956 m)   Wt 255 lb 12.8 oz (116 kg)   BMI 30.33 kg/m   Past Medical History:  Diagnosis Date   Diabetes mellitus without complication (HCC)    Hypertension    Renal disorder     Social History   Socioeconomic History   Marital status: Married    Spouse name: Not on file   Number of children: Not on file   Years of education: Not on file   Highest education level: Not on file  Occupational History   Not on file  Tobacco Use   Smoking status: Never   Smokeless tobacco: Never  Substance and Sexual Activity   Alcohol use: No   Drug use: Not on file   Sexual activity: Not on file  Other Topics Concern   Not on file  Social History Narrative   Not on file   Social Determinants of Health   Financial Resource Strain: Not on file  Food Insecurity: Not on file  Transportation Needs: Not on file  Physical Activity: Not on file  Stress: Not on file  Social Connections: Not on file  Intimate Partner Violence: Not on file    History reviewed. No pertinent surgical history.  History reviewed. No pertinent family history.  No Known Allergies     Latest Ref Rng & Units 03/18/2015    3:41 AM 05/06/2012    8:43 AM  CBC  WBC 3.8 - 10.6 K/uL 4.6  5.8   Hemoglobin 13.0 - 18.0 g/dL 11.3  11.5   Hematocrit 40.0 - 52.0 % 35.2  35.5   Platelets 150 - 440 K/uL 148  146       CMP     Component Value Date/Time   NA 142 03/18/2015 0341   NA 139 05/06/2012 0843   K 3.8 03/18/2015 0341   K 4.1 05/06/2012 0843   CL 112 (H) 03/18/2015 0341   CL 106 05/06/2012 0843   CO2 22 03/18/2015 0341   CO2 26 05/06/2012 0843   GLUCOSE 130 (H) 03/18/2015 0341   GLUCOSE 112 (H) 05/06/2012 0843   BUN 36 (H) 03/18/2015 0341   BUN 33 (H) 05/06/2012 0843   CREATININE 2.19 (H) 03/18/2015 0341   CREATININE 2.09 (H) 05/06/2012 0843   CALCIUM 9.6 03/18/2015 0341    CALCIUM 9.5 05/06/2012 0843   PROT 7.5 03/18/2015 0341   ALBUMIN 4.2 03/18/2015 0341   AST 19 03/18/2015 0341   ALT 14 (L) 03/18/2015 0341   ALKPHOS 54 03/18/2015 0341   BILITOT 0.7 03/18/2015 0341   GFRNONAA 28 (L) 03/18/2015 0341   GFRNONAA 31 (L) 05/06/2012 0843   GFRAA 32 (L) 03/18/2015 0341   GFRAA 36 (L) 05/06/2012 0843     No results found.     Assessment & Plan:   1. Chronic kidney disease, stage 4 (severe) (HCC) Recommend:  At this time the patient does not have appropriate extremity access for dialysis  Patient should have a left brachial cephalic AV fistula created.  The risks, benefits and alternative therapies were reviewed in detail with the patient.  All questions were answered.  The patient agrees to proceed with surgery.   The patient will follow up with me in the office after the surgery.    Current Outpatient Medications on File Prior to Visit  Medication Sig Dispense Refill   ALPRAZolam (XANAX) 0.5 MG tablet Take by mouth.     amLODipine (NORVASC) 10 MG tablet Take 10 mg by mouth daily.     aspirin EC 81 MG tablet Take by mouth.     Cholecalciferol 50 MCG (2000 UT) TABS Take by mouth.     Docusate Sodium (DSS) 100 MG CAPS Take by mouth.     donepezil (ARICEPT) 10 MG tablet Take by mouth.     enalapril (VASOTEC) 20 MG tablet Take 20 mg by mouth 2 (two) times daily.     finasteride (PROSCAR) 5 MG tablet Take 1 tablet (5 mg total) by mouth daily. 90 tablet 3   glipiZIDE (GLUCOTROL XL) 5 MG 24 hr tablet Take by mouth.     hydrALAZINE (APRESOLINE) 50 MG tablet Take 50 mg by mouth 3 (three) times daily.     metoprolol tartrate (LOPRESSOR) 25 MG tablet Take 1 tablet by mouth 2 (two) times daily.     Patiromer Sorbitex Calcium 16.8 g PACK Take by mouth.     tamsulosin (FLOMAX) 0.4 MG CAPS capsule Take 1 capsule (0.4 mg total) by mouth daily. 90 capsule 3   metoCLOPramide (REGLAN) 10 MG tablet Take 1 tablet (10 mg total) by mouth every  6 (six) hours as  needed for nausea. 15 tablet 0   No current facility-administered medications on file prior to visit.    There are no Patient Instructions on file for this visit. No follow-ups on file.   Kris Hartmann, NP

## 2021-11-05 ENCOUNTER — Other Ambulatory Visit (INDEPENDENT_AMBULATORY_CARE_PROVIDER_SITE_OTHER): Payer: Self-pay

## 2021-11-05 ENCOUNTER — Encounter (INDEPENDENT_AMBULATORY_CARE_PROVIDER_SITE_OTHER): Payer: Self-pay | Admitting: Nurse Practitioner

## 2021-11-05 ENCOUNTER — Encounter (INDEPENDENT_AMBULATORY_CARE_PROVIDER_SITE_OTHER): Payer: Self-pay

## 2021-11-06 ENCOUNTER — Telehealth (INDEPENDENT_AMBULATORY_CARE_PROVIDER_SITE_OTHER): Payer: Self-pay

## 2021-11-06 NOTE — Telephone Encounter (Signed)
Spoke with the patient's daughter and he is scheduled with Dr. Delana Meyer on 11/21/21 for a left brachial cephalic AV fistula at the [facility]. Pre-op phone call is on 11/13/21 between 8-1 pm. Pre-surgical instructions were discussed and will be mailed.

## 2021-11-10 ENCOUNTER — Encounter (INDEPENDENT_AMBULATORY_CARE_PROVIDER_SITE_OTHER): Payer: Self-pay

## 2021-11-11 NOTE — Telephone Encounter (Signed)
I was asked to move the patient to 11/14/21 from 11/21/21 for Dr. Delana Meyer. Spoke with the patient's daughter and spouse and the patient has been moved to 11/14/21 for his surgery with Dr. Delana Meyer.

## 2021-11-13 ENCOUNTER — Other Ambulatory Visit (INDEPENDENT_AMBULATORY_CARE_PROVIDER_SITE_OTHER): Payer: Self-pay | Admitting: Nurse Practitioner

## 2021-11-13 ENCOUNTER — Encounter
Admission: RE | Admit: 2021-11-13 | Discharge: 2021-11-13 | Disposition: A | Discharge status: Home / Self Care | Payer: Medicare PPO | Source: Ambulatory Visit | Attending: Vascular Surgery | Admitting: Vascular Surgery

## 2021-11-13 ENCOUNTER — Other Ambulatory Visit: Payer: Self-pay

## 2021-11-13 DIAGNOSIS — Z01812 Encounter for preprocedural laboratory examination: Secondary | ICD-10-CM

## 2021-11-13 DIAGNOSIS — N184 Chronic kidney disease, stage 4 (severe): Secondary | ICD-10-CM

## 2021-11-13 HISTORY — DX: Pneumonia, unspecified organism: J18.9

## 2021-11-13 HISTORY — DX: Anxiety disorder, unspecified: F41.9

## 2021-11-13 HISTORY — DX: Depression, unspecified: F32.A

## 2021-11-13 HISTORY — DX: Unspecified osteoarthritis, unspecified site: M19.90

## 2021-11-13 NOTE — Patient Instructions (Addendum)
Your procedure is scheduled on: 11/14/21 - Friday Report to the Registration Desk on the 1st floor of the [facility]. To find out your arrival time, please call [phone] between 1PM - 3PM on: 11/13/21 - Thursday If your arrival time is 6:00 am, do not arrive prior to that time as the [facility] entrance doors do not open until 6:00 am.  REMEMBER: Instructions that are not followed completely may result in serious medical risk, up to and including death; or upon the discretion of your surgeon and anesthesiologist your surgery may need to be rescheduled.  Do not eat food or drink any fluids after midnight the night before surgery.  No gum chewing, lozengers or hard candies.  TAKE THESE MEDICATIONS THE MORNING OF SURGERY WITH A SIP OF WATER:  - amLODipine (NORVASC) 10 MG tablet  - hydrALAZINE (APRESOLINE)  - metoprolol tartrate (LOPRESSOR)  - tamsulosin (FLOMAX)     - donepezil (ARICEPT)    One week prior to surgery: Stop Anti-inflammatories (NSAIDS) such as Advil, Aleve, Ibuprofen, Motrin, Naproxen, Naprosyn and Aspirin based products such as Excedrin, Goodys Powder, BC Powder.  Stop ANY OVER THE COUNTER supplements until after surgery.  You may however, continue to take Tylenol if needed for pain up until the day of surgery.  No Alcohol for 24 hours before or after surgery.  No Smoking including e-cigarettes for 24 hours prior to surgery.  No chewable tobacco products for at least 6 hours prior to surgery.  No nicotine patches on the day of surgery.  Do not use any "recreational" drugs for at least a week prior to your surgery.  Please be advised that the combination of cocaine and anesthesia may have negative outcomes, up to and including death. If you test positive for cocaine, your surgery will be cancelled.  On the morning of surgery brush your teeth with toothpaste and water, you may rinse your mouth with mouthwash if you wish. Do not  swallow any toothpaste or mouthwash.  Do not wear jewelry, make-up, hairpins, clips or nail polish.  Do not wear lotions, powders, or perfumes.   Do not shave body from the neck down 48 hours prior to surgery just in case you cut yourself which could leave a site for infection.  Also, freshly shaved skin may become irritated if using the CHG soap.  Contact lenses, hearing aids and dentures may not be worn into surgery.  Do not bring valuables to the hospital. [facility] is not responsible for any missing/lost belongings or valuables.   Notify your doctor if there is any change in your medical condition (cold, fever, infection).  Wear comfortable clothing (specific to your surgery type) to the hospital.  After surgery, you can help prevent lung complications by doing breathing exercises.  Take deep breaths and cough every 1-2 hours. Your doctor may order a device called an Incentive Spirometer to help you take deep breaths. When coughing or sneezing, hold a pillow firmly against your incision with both hands. This is called "splinting." Doing this helps protect your incision. It also decreases belly discomfort.  If you are being admitted to the hospital overnight, leave your suitcase in the car. After surgery it may be brought to your room.  If you are being discharged the day of surgery, you will not be allowed to drive home. You will need a responsible adult (18 years or older) to drive you home and stay with you that night.   If you are taking public  transportation, you will need to have a responsible adult (18 years or older) with you. Please confirm with your physician that it is acceptable to use public transportation.   Please call the [facility]. at [phone] if you have any questions about these instructions.  Surgery Visitation Policy:  Patients undergoing a surgery or procedure may have two family members or support persons with them as long as the  person is not COVID-19 positive or experiencing its symptoms.   Inpatient Visitation:    Visiting hours are 7 a.m. to 8 p.m. Up to four visitors are allowed at one time in a patient room, including children. The visitors may rotate out with other people during the day. One designated support person (adult) may remain overnight.

## 2021-11-13 NOTE — Anesthesia Preprocedure Evaluation (Addendum)
Anesthesia Evaluation  Patient identified by MRN, date of birth, ID band Patient awake    Reviewed: Allergy & Precautions, NPO status , Patient's Chart, lab work & pertinent test results  History of Anesthesia Complications Negative for: history of anesthetic complications  Airway Mallampati: II   Neck ROM: Full    Dental  (+) Missing   Pulmonary neg pulmonary ROS   Pulmonary exam normal breath sounds clear to auscultation       Cardiovascular hypertension, Normal cardiovascular exam Rhythm:Regular Rate:Normal  ECG 11/14/21:  Sinus bradycardia Right bundle branch block Septal infarct , age undetermined  Echo 06/05/20:  NORMAL LEFT VENTRICULAR SYSTOLIC FUNCTION WITH AN ESTIMATED EF = >55 %  NORMAL RIGHT VENTRICULAR SYSTOLIC FUNCTION  MODERATE TRICUSPID AND MITRAL VALVE INSUFFICIENCY  TRACE AORTIC VALVE INSUFFICIENCY  SCLEROTIC AORTIC VALVE  MODERATE BIATRIAL ENLARGEMENT  MILD RV ENLARGEMENT  MILD LVH    Neuro/Psych  PSYCHIATRIC DISORDERS Anxiety Depression     Neuromuscular disease (neuropathy)    GI/Hepatic negative GI ROS,,,  Endo/Other  diabetes, Type 2    Renal/GU Renal disease (stage IV CKD)   BPH    Musculoskeletal  (+) Arthritis ,    Abdominal   Peds  Hematology negative hematology ROS (+)   Anesthesia Other Findings   Reproductive/Obstetrics                             Anesthesia Physical Anesthesia Plan  ASA: 3  Anesthesia Plan: General   Post-op Pain Management:    Induction: Intravenous  PONV Risk Score and Plan: 2 and Ondansetron, Dexamethasone and Treatment may vary due to age or medical condition  Airway Management Planned: LMA  Additional Equipment:   Intra-op Plan:   Post-operative Plan: Extubation in OR  Informed Consent: I have reviewed the patients History and Physical, chart, labs and discussed the procedure including the risks, benefits and  alternatives for the proposed anesthesia with the patient or authorized representative who has indicated his/her understanding and acceptance.     Dental advisory given  Plan Discussed with: CRNA  Anesthesia Plan Comments: (Patient consented for risks of anesthesia including but not limited to:  - adverse reactions to medications - damage to eyes, teeth, lips or other oral mucosa - nerve damage due to positioning  - sore throat or hoarseness - damage to heart, brain, nerves, lungs, other parts of body or loss of life  Informed patient about role of CRNA in peri- and intra-operative care.  Patient voiced understanding.)        Anesthesia Quick Evaluation

## 2021-11-14 ENCOUNTER — Ambulatory Visit: Payer: Medicare PPO | Admitting: Urgent Care

## 2021-11-14 ENCOUNTER — Other Ambulatory Visit: Payer: Self-pay

## 2021-11-14 ENCOUNTER — Other Ambulatory Visit (INDEPENDENT_AMBULATORY_CARE_PROVIDER_SITE_OTHER): Payer: Medicare PPO | Admitting: Vascular Surgery

## 2021-11-14 ENCOUNTER — Ambulatory Visit
Admission: RE | Admit: 2021-11-14 | Discharge: 2021-11-14 | Disposition: A | Discharge status: Home / Self Care | Payer: Medicare PPO | Source: Ambulatory Visit | Attending: Vascular Surgery | Admitting: Vascular Surgery

## 2021-11-14 ENCOUNTER — Encounter
Admission: RE | Disposition: A | Discharge status: Home / Self Care | Payer: Self-pay | Source: Ambulatory Visit | Attending: Vascular Surgery

## 2021-11-14 ENCOUNTER — Telehealth (INDEPENDENT_AMBULATORY_CARE_PROVIDER_SITE_OTHER): Payer: Self-pay

## 2021-11-14 DIAGNOSIS — F418 Other specified anxiety disorders: Secondary | ICD-10-CM | POA: Diagnosis not present

## 2021-11-14 DIAGNOSIS — N186 End stage renal disease: Secondary | ICD-10-CM | POA: Insufficient documentation

## 2021-11-14 DIAGNOSIS — E1122 Type 2 diabetes mellitus with diabetic chronic kidney disease: Secondary | ICD-10-CM | POA: Diagnosis not present

## 2021-11-14 DIAGNOSIS — I120 Hypertensive chronic kidney disease with stage 5 chronic kidney disease or end stage renal disease: Secondary | ICD-10-CM | POA: Insufficient documentation

## 2021-11-14 DIAGNOSIS — E1140 Type 2 diabetes mellitus with diabetic neuropathy, unspecified: Secondary | ICD-10-CM | POA: Insufficient documentation

## 2021-11-14 DIAGNOSIS — N184 Chronic kidney disease, stage 4 (severe): Secondary | ICD-10-CM

## 2021-11-14 DIAGNOSIS — Z01812 Encounter for preprocedural laboratory examination: Secondary | ICD-10-CM

## 2021-11-14 HISTORY — PX: APPLICATION OF WOUND VAC: SHX5189

## 2021-11-14 HISTORY — PX: AV FISTULA PLACEMENT: SHX1204

## 2021-11-14 LAB — POCT I-STAT, CHEM 8
BUN: 46 mg/dL — ABNORMAL HIGH (ref 8–23)
Calcium, Ion: 1.3 mmol/L (ref 1.15–1.40)
Chloride: 117 mmol/L — ABNORMAL HIGH (ref 98–111)
Creatinine, Ser: 3.6 mg/dL — ABNORMAL HIGH (ref 0.61–1.24)
Glucose, Bld: 109 mg/dL — ABNORMAL HIGH (ref 70–99)
HCT: 34 % — ABNORMAL LOW (ref 39.0–52.0)
Hemoglobin: 11.6 g/dL — ABNORMAL LOW (ref 13.0–17.0)
Potassium: 5 mmol/L (ref 3.5–5.1)
Sodium: 144 mmol/L (ref 135–145)
TCO2: 17 mmol/L — ABNORMAL LOW (ref 22–32)

## 2021-11-14 LAB — CBC WITH DIFFERENTIAL/PLATELET
Abs Immature Granulocytes: 0.02 10*3/uL (ref 0.00–0.07)
Basophils Absolute: 0.1 10*3/uL (ref 0.0–0.1)
Basophils Relative: 1 %
Eosinophils Absolute: 0.1 10*3/uL (ref 0.0–0.5)
Eosinophils Relative: 3 %
HCT: 35 % — ABNORMAL LOW (ref 39.0–52.0)
Hemoglobin: 10.7 g/dL — ABNORMAL LOW (ref 13.0–17.0)
Immature Granulocytes: 0 %
Lymphocytes Relative: 22 %
Lymphs Abs: 1 10*3/uL (ref 0.7–4.0)
MCH: 27.5 pg (ref 26.0–34.0)
MCHC: 30.6 g/dL (ref 30.0–36.0)
MCV: 90 fL (ref 80.0–100.0)
Monocytes Absolute: 0.5 10*3/uL (ref 0.1–1.0)
Monocytes Relative: 10 %
Neutro Abs: 3.1 10*3/uL (ref 1.7–7.7)
Neutrophils Relative %: 64 %
Platelets: 151 10*3/uL (ref 150–400)
RBC: 3.89 MIL/uL — ABNORMAL LOW (ref 4.22–5.81)
RDW: 14.6 % (ref 11.5–15.5)
WBC: 4.8 10*3/uL (ref 4.0–10.5)
nRBC: 0 % (ref 0.0–0.2)

## 2021-11-14 LAB — GLUCOSE, CAPILLARY: Glucose-Capillary: 150 mg/dL — ABNORMAL HIGH (ref 70–99)

## 2021-11-14 LAB — TYPE AND SCREEN
ABO/RH(D): B POS
Antibody Screen: NEGATIVE

## 2021-11-14 LAB — ABO/RH: ABO/RH(D): B POS

## 2021-11-14 SURGERY — ARTERIOVENOUS (AV) FISTULA CREATION
Anesthesia: General | Site: Leg Lower | Laterality: Right

## 2021-11-14 MED ORDER — FAMOTIDINE 20 MG PO TABS: 20.0000 mg | ORAL_TABLET | Freq: Once | ORAL | Status: AC

## 2021-11-14 MED ORDER — CEFAZOLIN SODIUM-DEXTROSE 2-4 GM/100ML-% IV SOLN
INTRAVENOUS | Status: AC
  Filled 2021-11-14: qty 100

## 2021-11-14 MED ORDER — PAPAVERINE HCL 30 MG/ML IJ SOLN
INTRAMUSCULAR | Status: AC
  Filled 2021-11-14: qty 2

## 2021-11-14 MED ORDER — HEPARIN SODIUM (PORCINE) 5000 UNIT/ML IJ SOLN
INTRAMUSCULAR | Status: AC
  Filled 2021-11-14: qty 1

## 2021-11-14 MED ORDER — OXYCODONE HCL 5 MG/5ML PO SOLN: 5.0000 mg | Freq: Once | ORAL | Status: DC | PRN

## 2021-11-14 MED ORDER — EPHEDRINE 5 MG/ML INJ
INTRAVENOUS | Status: AC
  Filled 2021-11-14: qty 10

## 2021-11-14 MED ORDER — BUPIVACAINE HCL (PF) 0.5 % IJ SOLN
INTRAMUSCULAR | Status: AC
  Filled 2021-11-14: qty 30

## 2021-11-14 MED ORDER — GLYCOPYRROLATE 0.2 MG/ML IJ SOLN
INTRAMUSCULAR | Status: DC | PRN
  Administered 2021-11-14: .2 mg via INTRAVENOUS

## 2021-11-14 MED ORDER — FAMOTIDINE 20 MG PO TABS
ORAL_TABLET | ORAL | Status: AC
  Administered 2021-11-14: 20 mg via ORAL
  Filled 2021-11-14: qty 1

## 2021-11-14 MED ORDER — PROPOFOL 10 MG/ML IV BOLUS
INTRAVENOUS | Status: DC | PRN
  Administered 2021-11-14: 20 mg via INTRAVENOUS
  Administered 2021-11-14: 120 mg via INTRAVENOUS

## 2021-11-14 MED ORDER — EPHEDRINE 5 MG/ML INJ
INTRAVENOUS | Status: AC
  Filled 2021-11-14: qty 5

## 2021-11-14 MED ORDER — BUPIVACAINE LIPOSOME 1.3 % IJ SUSP
INTRAMUSCULAR | Status: AC
  Filled 2021-11-14: qty 20

## 2021-11-14 MED ORDER — PROPOFOL 10 MG/ML IV BOLUS
INTRAVENOUS | Status: AC
  Filled 2021-11-14: qty 20

## 2021-11-14 MED ORDER — OXYCODONE HCL 5 MG PO TABS: 5.0000 mg | ORAL_TABLET | Freq: Once | ORAL | Status: DC | PRN

## 2021-11-14 MED ORDER — ORAL CARE MOUTH RINSE: 15.0000 mL | Freq: Once | OROMUCOSAL | Status: AC

## 2021-11-14 MED ORDER — PHENYLEPHRINE 80 MCG/ML (10ML) SYRINGE FOR IV PUSH (FOR BLOOD PRESSURE SUPPORT)
PREFILLED_SYRINGE | INTRAVENOUS | Status: AC
  Filled 2021-11-14: qty 10

## 2021-11-14 MED ORDER — PHENYLEPHRINE HCL (PRESSORS) 10 MG/ML IV SOLN
INTRAVENOUS | Status: DC | PRN
  Administered 2021-11-14: 80 ug via INTRAVENOUS
  Administered 2021-11-14: 40 ug via INTRAVENOUS

## 2021-11-14 MED ORDER — FENTANYL CITRATE (PF) 100 MCG/2ML IJ SOLN
INTRAMUSCULAR | Status: DC | PRN
  Administered 2021-11-14 (×4): 25 ug via INTRAVENOUS

## 2021-11-14 MED ORDER — CHLORHEXIDINE GLUCONATE CLOTH 2 % EX PADS: 6.0000 | MEDICATED_PAD | Freq: Once | CUTANEOUS | Status: DC

## 2021-11-14 MED ORDER — FENTANYL CITRATE (PF) 100 MCG/2ML IJ SOLN: 25.0000 ug | INTRAMUSCULAR | Status: DC | PRN

## 2021-11-14 MED ORDER — CHLORHEXIDINE GLUCONATE 0.12 % MT SOLN
OROMUCOSAL | Status: AC
  Administered 2021-11-14: 15 mL via OROMUCOSAL
  Filled 2021-11-14: qty 15

## 2021-11-14 MED ORDER — ACETAMINOPHEN 10 MG/ML IV SOLN: 1000.0000 mg | Freq: Once | INTRAVENOUS | Status: DC | PRN

## 2021-11-14 MED ORDER — OXYCODONE-ACETAMINOPHEN 5-325 MG PO TABS: 1.0000 | ORAL_TABLET | Freq: Four times a day (QID) | ORAL | 0 refills | Status: DC | PRN

## 2021-11-14 MED ORDER — CEFAZOLIN SODIUM-DEXTROSE 2-4 GM/100ML-% IV SOLN
2.0000 g | INTRAVENOUS | Status: AC
  Administered 2021-11-14: 2 g via INTRAVENOUS

## 2021-11-14 MED ORDER — CHLORHEXIDINE GLUCONATE 0.12 % MT SOLN: 15.0000 mL | Freq: Once | OROMUCOSAL | Status: AC

## 2021-11-14 MED ORDER — VASOPRESSIN 20 UNIT/ML IV SOLN
INTRAVENOUS | Status: DC | PRN
  Administered 2021-11-14 (×2): 2 [IU] via INTRAVENOUS
  Administered 2021-11-14 (×2): 1 [IU] via INTRAVENOUS
  Administered 2021-11-14: 2 [IU] via INTRAVENOUS
  Administered 2021-11-14 (×3): 1 [IU] via INTRAVENOUS

## 2021-11-14 MED ORDER — PHENYLEPHRINE HCL-NACL 20-0.9 MG/250ML-% IV SOLN
INTRAVENOUS | Status: DC | PRN
  Administered 2021-11-14: 30 ug/min via INTRAVENOUS

## 2021-11-14 MED ORDER — VASOPRESSIN 20 UNIT/ML IV SOLN
INTRAVENOUS | Status: AC
  Filled 2021-11-14: qty 1

## 2021-11-14 MED ORDER — ONDANSETRON HCL 4 MG/2ML IJ SOLN: 4.0000 mg | Freq: Once | INTRAMUSCULAR | Status: DC | PRN

## 2021-11-14 MED ORDER — FENTANYL CITRATE (PF) 100 MCG/2ML IJ SOLN
INTRAMUSCULAR | Status: AC
  Filled 2021-11-14: qty 2

## 2021-11-14 MED ORDER — LACTATED RINGERS IV SOLN: INTRAVENOUS | Status: DC

## 2021-11-14 MED ORDER — HYDROCODONE-ACETAMINOPHEN 5-325 MG PO TABS: 1.0000 | ORAL_TABLET | Freq: Four times a day (QID) | ORAL | 0 refills | Status: DC | PRN

## 2021-11-14 MED ORDER — EPHEDRINE SULFATE (PRESSORS) 50 MG/ML IJ SOLN
INTRAMUSCULAR | Status: DC | PRN
  Administered 2021-11-14 (×2): 5 mg via INTRAVENOUS
  Administered 2021-11-14 (×5): 10 mg via INTRAVENOUS

## 2021-11-14 MED ORDER — LIDOCAINE HCL (CARDIAC) PF 100 MG/5ML IV SOSY
PREFILLED_SYRINGE | INTRAVENOUS | Status: DC | PRN
  Administered 2021-11-14: 80 mg via INTRAVENOUS

## 2021-11-14 MED ORDER — SODIUM CHLORIDE 0.9 % IV SOLN: INTRAVENOUS | Status: DC

## 2021-11-14 MED ORDER — SODIUM CHLORIDE 0.9 % IR SOLN
Status: DC | PRN
  Administered 2021-11-14: 150 mL

## 2021-11-14 MED ORDER — PHENYLEPHRINE HCL-NACL 20-0.9 MG/250ML-% IV SOLN
INTRAVENOUS | Status: AC
  Filled 2021-11-14: qty 250

## 2021-11-14 SURGICAL SUPPLY — 55 items
ADH SKN CLS APL DERMABOND .7 (GAUZE/BANDAGES/DRESSINGS) ×2
APL PRP STRL LF DISP 70% ISPRP (MISCELLANEOUS) ×2
APPLIER CLIP 11 MED OPEN (CLIP)
APPLIER CLIP 9.375 SM OPEN (CLIP)
APR CLP MED 11 20 MLT OPN (CLIP)
APR CLP SM 9.3 20 MLT OPN (CLIP)
BAG DECANTER FOR FLEXI CONT (MISCELLANEOUS) ×2
BLADE SURG SZ11 CARB STEEL (BLADE) ×2
BOOT SUTURE AID YELLOW STND (SUTURE) ×2
BRUSH SCRUB EZ  4% CHG (MISCELLANEOUS) ×2
BRUSH SCRUB EZ 4% CHG (MISCELLANEOUS) ×2
CHLORAPREP W/TINT 26 (MISCELLANEOUS) ×2
DERMABOND ADVANCED .7 DNX12 (GAUZE/BANDAGES/DRESSINGS) ×2
DRSG SURGICEL FIBRILLAR 1X2 (HEMOSTASIS)
ELECT CAUTERY BLADE 6.4 (BLADE) ×2
ELECT REM PT RETURN 9FT ADLT (ELECTROSURGICAL) ×2
GEL ULTRASOUND 20GR AQUASONIC (MISCELLANEOUS)
GLOVE BIO SURGEON STRL SZ7 (GLOVE) ×10
GLOVE SURG SYN 7.0 (GLOVE) IMPLANT
GLOVE SURG SYN 8.0 (GLOVE) ×2 IMPLANT
GOWN STRL REUS W/ TWL LRG LVL3 (GOWN DISPOSABLE) ×6
GOWN STRL REUS W/ TWL XL LVL3 (GOWN DISPOSABLE) ×2
GOWN STRL REUS W/TWL LRG LVL3 (GOWN DISPOSABLE) ×6
GOWN STRL REUS W/TWL XL LVL3 (GOWN DISPOSABLE) ×2
IV NS 500ML (IV SOLUTION) ×2
IV NS 500ML BAXH (IV SOLUTION) ×2
KIT TURNOVER KIT A (KITS) ×2
LABEL OR SOLS (LABEL) ×2
LOOP RED MAXI  1X406MM (MISCELLANEOUS) ×2
LOOP VESSEL MAXI 1X406 RED (MISCELLANEOUS) ×2
LOOP VESSEL MINI 0.8X406 BLUE (MISCELLANEOUS) ×2
LOOPS BLUE MINI 0.8X406MM (MISCELLANEOUS) ×2
MANIFOLD NEPTUNE II (INSTRUMENTS) ×2
NDL FILTER BLUNT 18X1 1/2 (NEEDLE) ×2 IMPLANT
NDL HYPO 30X.5 LL (NEEDLE) IMPLANT
NEEDLE FILTER BLUNT 18X1 1/2 (NEEDLE) ×2
NEEDLE HYPO 30X.5 LL (NEEDLE)
PACK EXTREMITY ARMC (MISCELLANEOUS) ×2
PAD PREP 24X41 OB/GYN DISP (PERSONAL CARE ITEMS)
STOCKINETTE STRL 4IN 9604848 (GAUZE/BANDAGES/DRESSINGS) ×2 IMPLANT
SUT MNCRL+ 5-0 UNDYED PC-3 (SUTURE) ×2
SUT MONOCRYL 5-0 (SUTURE) ×2
SUT PROLENE 6 0 BV (SUTURE) ×4
SUT SILK 2 0 (SUTURE) ×2
SUT SILK 2-0 18XBRD TIE 12 (SUTURE) ×2
SUT SILK 3 0 (SUTURE) ×2
SUT SILK 3-0 18XBRD TIE 12 (SUTURE) ×2
SUT SILK 4 0 (SUTURE) ×2
SUT SILK 4-0 18XBRD TIE 12 (SUTURE) ×2
SUT VIC AB 3-0 SH 27 (SUTURE) ×2
SUT VIC AB 3-0 SH 27X BRD (SUTURE) ×2
SYR 20ML LL LF (SYRINGE) ×2
SYR 3ML LL SCALE MARK (SYRINGE) ×2
TRAP FLUID SMOKE EVACUATOR (MISCELLANEOUS) ×2
WATER STERILE IRR 500ML POUR (IV SOLUTION)

## 2021-11-14 NOTE — Telephone Encounter (Signed)
Spoke with Dava to let her know that rx was sent

## 2021-11-14 NOTE — Transfer of Care (Signed)
Immediate Anesthesia Transfer of Care Note  Patient: Jeffery Joseph  Procedure(s) Performed: ARTERIOVENOUS (AV) FISTULA CREATION ( BRACHIAL CEPHALIC) (Left)  Patient Location: PACU  Anesthesia Type:General  Level of Consciousness: awake, alert , and oriented  Airway & Oxygen Therapy: Patient Spontanous Breathing  Post-op Assessment: Report given to RN and Post -op Vital signs reviewed and stable  Post vital signs: Reviewed and stable  Last Vitals:  Vitals Value Taken Time  BP 154/59 11/14/21 0948  Temp 36.9 C 11/14/21 0948  Pulse 53 11/14/21 0955  Resp 8 11/14/21 0955  SpO2 98 % 11/14/21 0955  Vitals shown include unvalidated device data.  Last Pain:  Vitals:   11/14/21 0714  TempSrc: Temporal  PainSc: 0-No pain         Complications: No notable events documented.

## 2021-11-14 NOTE — Anesthesia Procedure Notes (Signed)
Procedure Name: LMA Insertion Date/Time: 11/14/2021 7:46 AM  Performed by: Hedda Slade, CRNAPre-anesthesia Checklist: Patient identified, Patient being monitored, Timeout performed, Emergency Drugs available and Suction available Patient Re-evaluated:Patient Re-evaluated prior to induction Oxygen Delivery Method: Circle system utilized Preoxygenation: Pre-oxygenation with 100% oxygen Induction Type: IV induction Ventilation: Mask ventilation without difficulty LMA: LMA inserted LMA Size: 5.0 Tube type: Oral Number of attempts: 1 Placement Confirmation: positive ETCO2 and breath sounds checked- equal and bilateral Tube secured with: Tape Dental Injury: Teeth and Oropharynx as per pre-operative assessment

## 2021-11-14 NOTE — Progress Notes (Signed)
Pharmacy is out of [facility]

## 2021-11-14 NOTE — Interval H&P Note (Signed)
History and Physical Interval Note:  11/14/2021 7:30 AM  Jeffery Joseph  has presented today for surgery, with the diagnosis of ESRD.  The various methods of treatment have been discussed with the patient and family. After consideration of risks, benefits and other options for treatment, the patient has consented to  Procedure(s): ARTERIOVENOUS (AV) FISTULA CREATION ( BRACHIAL CEPHALIC) (Left) as a surgical intervention.  The patient's history has been reviewed, patient examined, no change in status, stable for surgery.  I have reviewed the patient's chart and labs.  Questions were answered to the patient's satisfaction.     Hortencia Pilar

## 2021-11-14 NOTE — Op Note (Signed)
  OPERATIVE NOTE   PROCEDURE: left brachial cephalic arteriovenous fistula placement  PRE-OPERATIVE DIAGNOSIS: End Stage Renal Disease  POST-OPERATIVE DIAGNOSIS: End Stage Renal Disease  SURGEON: Hortencia Pilar  ASSISTANT(S): Annalee Genta  ANESTHESIA: general  ESTIMATED BLOOD LOSS: <50 cc  FINDING(S): 4.5 mm cephalic vein  SPECIMEN(S):  none  INDICATIONS:   Jeffery Joseph is a 81 y.o. male who presents with end stage renal disease.  The patient is scheduled for left brachiocephalic arteriovenous fistula placement.  The patient is aware the risks include but are not limited to: bleeding, infection, steal syndrome, nerve damage, ischemic monomelic neuropathy, failure to mature, and need for additional procedures.  The patient is aware of the risks of the procedure and elects to proceed forward.  DESCRIPTION: After full informed written consent was obtained from the patient, the patient was brought back to the operating room and placed supine upon the operating table.  Prior to induction, the patient received IV antibiotics.   After obtaining adequate anesthesia, the patient was then prepped and draped in the standard fashion for a left arm access procedure.   A first assistant was required to provide a safe and appropriate environment for executing the surgery.  The assistant was integral in providing retraction, exposure, running suture providing suction and in the closing process.   A curvilinear incision was then created midway between the radial impulse and the cephalic vein. The cephalic vein was then identified and dissected circumferentially. It was marked with a surgical marker.    Attention was then turned to the brachial artery which was exposed through the same incision and looped proximally and distally. Side branches were controlled with 4-0 silk ties.  The distal segment of the vein was ligated with a  2-0 silk, and the vein was transected.  The proximal segment was  interrogated with serial dilators.  The vein accepted up to a 4.5 mm dilator without any difficulty. Heparinized saline was infused into the vein and clamped it with a small bulldog.  At this point, I reset my exposure of the brachial artery and controlled the artery with vessel loops proximally and distally.  An arteriotomy was then made with a #11 blade, and extended with a Potts scissor.  Heparinized saline was injected proximal and distal into the radial artery.  The vein was then approximated to the artery while the artery was in its native bed and subsequently the vein was beveled using Potts scissors. The vein was then sewn to the artery in an end-to-side configuration with a running stitch of 6-0 Prolene.  Prior to completing this anastomosis Flushing maneuvers were performed and the artery was allowed to forward and back bleed.  There was no evidence of clot from any vessels.  I completed the anastomosis in the usual fashion and then released all vessel loops and clamps.    There was good  thrill in the venous outflow, and there was 1+ palpable radial pulse.  At this point, I irrigated out the surgical wound.  There was no further active bleeding.  The subcutaneous tissue was reapproximated with a running stitch of 3-0 Vicryl.  The skin was then reapproximated with a running subcuticular stitch of 4-0 Vicryl.  The skin was then cleaned, dried, and reinforced with Dermabond.    The patient tolerated this procedure well.   COMPLICATIONS: None  CONDITION: Jeffery Joseph [facility]  Office: [phone]   11/14/2021, 9:40 AM

## 2021-11-14 NOTE — Telephone Encounter (Signed)
Dava called stating that Norco is ob back order and her dad needed something else for pain.  Percocet was sent to [facility] [address] and the [employer] was cancelled per Arna Medici.

## 2021-11-17 ENCOUNTER — Encounter: Payer: Self-pay | Admitting: Vascular Surgery

## 2021-11-18 NOTE — Anesthesia Postprocedure Evaluation (Signed)
Anesthesia Post Note  Patient: Jemar L Walraven  Procedure(s) Performed: ARTERIOVENOUS (AV) FISTULA CREATION ( BRACHIAL CEPHALIC) (Left) APPLICATION OF WOUND VAC (Right: Leg Lower)  Patient location during evaluation: PACU Anesthesia Type: General Level of consciousness: awake and alert Pain management: pain level controlled Vital Signs Assessment: post-procedure vital signs reviewed and stable Respiratory status: spontaneous breathing, nonlabored ventilation, respiratory function stable and patient connected to nasal cannula oxygen Cardiovascular status: blood pressure returned to baseline and stable Postop Assessment: no apparent nausea or vomiting Anesthetic complications: no   No notable events documented.   Last Vitals:  Vitals:   11/14/21 1015 11/14/21 1043  BP:  (!) 139/51  Pulse: (!) 55 (!) 56  Resp: 14 18  Temp: (!) 36.3 C   SpO2: 98% 100%    Last Pain:  Vitals:   11/17/21 1029  TempSrc:   PainSc: 2                  Martha Clan

## 2021-12-07 NOTE — Progress Notes (Signed)
    Patient ID: Jeffery Joseph, male   DOB: Mar 14, 1940, 81 y.o.   MRN: [id number]  Follow up s/p fistula creation  HPI Jeffery Joseph is a 81 y.o. male.    The patient is s/p left brachial cephalic arteriovenous fistula placement on 11/14/2021.  No c/o   Past Medical History:  Diagnosis Date   Anxiety    Arthritis    Depression    Diabetes mellitus without complication ([place])    Hypertension    Pneumonia    Renal disorder     Past Surgical History:  Procedure Laterality Date   APPLICATION OF WOUND VAC Right 11/14/2021   Procedure: APPLICATION OF WOUND VAC;  Surgeon: Katha Cabal, MD;  Location: [facility] ORS;  Service: Vascular;  Laterality: Right;   AV FISTULA PLACEMENT Left 11/14/2021   Procedure: ARTERIOVENOUS (AV) FISTULA CREATION ( BRACHIAL CEPHALIC);  Surgeon: Katha Cabal, MD;  Location: [facility] ORS;  Service: Vascular;  Laterality: Left;      No Known Allergies  Current Outpatient Medications  Medication Sig Dispense Refill   acetaminophen (TYLENOL) 325 MG tablet Take 650 mg by mouth every 6 (six) hours as needed.     ALPRAZolam (XANAX) 0.5 MG tablet Take by mouth.     amLODipine (NORVASC) 10 MG tablet Take 10 mg by mouth daily.     Cholecalciferol 50 MCG (2000 UT) TABS Take by mouth.     Docusate Sodium (DSS) 100 MG CAPS Take by mouth as needed.     donepezil (ARICEPT) 10 MG tablet Take by mouth.     enalapril (VASOTEC) 20 MG tablet Take 20 mg by mouth 2 (two) times daily.     finasteride (PROSCAR) 5 MG tablet Take 1 tablet (5 mg total) by mouth daily. 90 tablet 3   glipiZIDE (GLUCOTROL XL) 5 MG 24 hr tablet Take by mouth.     hydrALAZINE (APRESOLINE) 50 MG tablet Take 50 mg by mouth 3 (three) times daily.     HYDROcodone-acetaminophen (NORCO) 5-325 MG tablet Take 1 tablet by mouth every 6 (six) hours as needed for moderate pain or severe pain. 30 tablet 0   metoprolol tartrate (LOPRESSOR) 25 MG tablet Take 1 tablet by mouth 2 (two) times daily.      oxyCODONE-acetaminophen (PERCOCET/ROXICET) 5-325 MG tablet Take 1 tablet by mouth every 6 (six) hours as needed for severe pain or moderate pain. 30 tablet 0   Patiromer Sorbitex Calcium 16.8 g PACK Take by mouth.     tamsulosin (FLOMAX) 0.4 MG CAPS capsule Take 1 capsule (0.4 mg total) by mouth daily. 90 capsule 3   No current facility-administered medications for this visit.        Physical Exam There were no vitals taken for this visit. Gen:  WD/WN, NAD Skin: incision C/D/I; good thrill and good bruit     Assessment/Plan: 1. End stage renal disease (Jeffery Joseph) Recommend:  The patient is doing well and currently has adequate dialysis access. The patient's nephrologist is not reporting any access issues. Flow pattern is stable when compared to the prior ultrasound.  The patient should have a duplex ultrasound of the dialysis access in 6 months. The patient will follow-up with me in the office after each ultrasound   - VAS [place] (AVF, AVG); Future      Hortencia Pilar 12/07/2021, 3:47 PM   This note was created with Dragon medical transcription system.  Any errors from dictation are unintentional.

## 2021-12-08 ENCOUNTER — Ambulatory Visit (INDEPENDENT_AMBULATORY_CARE_PROVIDER_SITE_OTHER): Payer: Medicare PPO | Admitting: Vascular Surgery

## 2021-12-08 ENCOUNTER — Encounter (INDEPENDENT_AMBULATORY_CARE_PROVIDER_SITE_OTHER): Payer: Self-pay | Admitting: Vascular Surgery

## 2021-12-08 VITALS — BP 148/63 | HR 54 | Resp 18 | Ht 77.0 in | Wt 263.8 lb

## 2021-12-08 DIAGNOSIS — N186 End stage renal disease: Secondary | ICD-10-CM

## 2022-08-12 ENCOUNTER — Encounter: Payer: Self-pay | Admitting: Urology

## 2022-08-12 ENCOUNTER — Ambulatory Visit: Payer: Medicare PPO | Admitting: Urology

## 2022-08-12 VITALS — Ht 77.0 in | Wt 249.0 lb

## 2022-08-12 DIAGNOSIS — N401 Enlarged prostate with lower urinary tract symptoms: Secondary | ICD-10-CM

## 2022-08-12 LAB — BLADDER SCAN AMB NON-IMAGING: PVR: 4 WU

## 2022-08-12 NOTE — Progress Notes (Signed)
I,Dina M Abdulla,acting as a scribe for Riki Altes, MD.,have documented all relevant documentation on the behalf of Riki Altes, MD,as directed by  Riki Altes, MD while in the presence of Riki Altes, MD.    08/12/2022 12:48 PM   Jeffery Joseph [birth year] [id number]  Referring provider: Mick Sell, MD [address] [place],  [place] [address]  Chief Complaint  Patient presents with   Follow-up    1 year follow up w/ pvr     Urologic history: 1.  BPH with LUTS Combination therapy tamsulosin/finasteride   HPI: 82 y.o. male presents for annual follow-up.  No problems since last years visit Remains on tamsulosin/finasteride Denies dysuria, gross hematuria Denies flank, abdominal or pelvic pain   PMH: Past Medical History:  Diagnosis Date   Anxiety    Arthritis    Depression    Diabetes mellitus without complication (HCC)    Hypertension    Pneumonia    Renal disorder     Surgical History: Past Surgical History:  Procedure Laterality Date   APPLICATION OF WOUND VAC Right 11/14/2021   Procedure: APPLICATION OF WOUND VAC;  Surgeon: Renford Dills, MD;  Location: [facility] OR[facility];  Service: Vascular;  Laterality: Right;   AV FISTULA PLACEMENT Left 11/14/2021   Procedure: ARTERIOVENOUS (AV) FISTULA CREATION ( BRACHIAL CEPHALIC);  Surgeon: Renford Dills, MD;  Location: [facility] OR[facility];  Service: Vascular;  Laterality: Left;    Home Medications:  Allergies as of 08/12/2022   No Known Allergies      Medication List        Accurate as of August 12, 2022 12:48 PM. If you have any questions, ask your nurse or doctor.          STOP taking these medications    HYDROcodone-acetaminophen 5-325 MG tablet Commonly known as: Norco Stopped by: Riki Altes   oxyCODONE-acetaminophen 5-325 MG tablet Commonly known as: PERCOCET/ROXICET Stopped by: Riki Altes       TAKE these medications    acetaminophen 325 MG tablet Commonly  known as: TYLENOL Take 650 mg by mouth every 6 (six) hours as needed.   ALPRAZolam 0.5 MG tablet Commonly known as: XANAX Take by mouth.   amLODipine 10 MG tablet Commonly known as: NORVASC Take 10 mg by mouth daily.   Cholecalciferol 50 MCG (2000 UT) Tabs Take by mouth.   donepezil 10 MG tablet Commonly known as: ARICEPT Take by mouth.   DSS 100 MG Caps Take by mouth as needed.   enalapril 20 MG tablet Commonly known as: VASOTEC Take 20 mg by mouth 2 (two) times daily.   finasteride 5 MG tablet Commonly known as: PROSCAR Take 1 tablet (5 mg total) by mouth daily.   glipiZIDE 5 MG 24 hr tablet Commonly known as: GLUCOTROL XL Take by mouth.   hydrALAZINE 50 MG tablet Commonly known as: APRESOLINE Take 50 mg by mouth 3 (three) times daily.   metoprolol tartrate 25 MG tablet Commonly known as: LOPRESSOR Take 1 tablet by mouth 2 (two) times daily.   Patiromer Sorbitex Calcium 16.8 g Pack Take by mouth.   tamsulosin 0.4 MG Caps capsule Commonly known as: FLOMAX Take 1 capsule (0.4 mg total) by mouth daily.         Social History:  reports that he has never smoked. He has never used smokeless tobacco. He reports that he does not currently use drugs. He reports that he does not drink alcohol.  Physical Exam: Ht 6\' 5"  (1.956 m)   Wt 249 lb (112.9 kg)   BMI 29.53 kg/m   Constitutional:  Alert and oriented, No acute distress. HEENT: [place] AT, moist mucus membranes.  Trachea midline, no masses. Cardiovascular: No clubbing, cyanosis, or edema. Respiratory: Normal respiratory effort, no increased work of breathing. GI: Abdomen is soft, nontender, nondistended, no abdominal masses GU: Desires to go to every other year DRE   Assessment & Plan:    1. Benign prostatic hyperplasia with LUTS Stable voiding symptoms on tamsulosin/finasteride His medications are refilled by Dr. Sampson Goon PVR today 4 mL (45 mL last year) Follow-up 1 year with [facility]    [facility] [address], [address] [place], [place] [address] [phone]

## 2022-08-14 ENCOUNTER — Ambulatory Visit: Payer: Medicare PPO | Admitting: Urology

## 2022-11-25 NOTE — Progress Notes (Signed)
 Jeffery Joseph is a 82 y.o. male here for follow up Chief Complaint Chief Complaint  Patient presents with  . Annual Exam    Patient declined retinal photo.  He will go see Dr Carolee early next year.   HPI Since last seen he continues to follow with nephrology.  He does have advanced chronic kidney disease and has a functional left upper extremity AV fistula in place.  Renal is following closely and he is on amlodipine  enalapril  hydralazine  metoprolol . No emergent need for HD yet but planning on it. Sleeping better. Takes xanax  tid for his anxiety For DM remains on glipizide . No lows.  BP stable. Not on the lasix  any longer. Edema stable Followed in past at [facility] -  some urinary retention and is on flomax  and  finasteride .  Following locally now. For his anxiety pharmacy was unable to obtain oxazepam which he has been on for years so we switched your alprazolam .   Problem List Patient Active Problem List  Diagnosis  . Type 2 diabetes mellitus (CMS/HHS-HCC)  . Hypertension  . Benign prostatic hyperplasia  . Chronic kidney disease  . Osteoarthritis  . Anxiety  . Onychomycosis  . DNR (do not resuscitate)  . Anemia in chronic kidney disease  . Benign essential hypertension  . Congenital multiple renal cysts  . Elevated prostate specific antigen (PSA)  . Incomplete bladder emptying  . Inguinal hernia, right  . Left renal artery stenosis (CMS-HCC)  . Vitamin D  deficiency  . Benign localized prostatic hyperplasia with lower urinary tract symptoms (LUTS)  . Stage 3 chronic kidney disease (CMS/HHS-HCC)    Past Medical History Past Medical History:  Diagnosis Date  . Anxiety   . BPH (benign prostatic hypertrophy)   . Chronic kidney disease   . DNR (do not resuscitate) 10/29/2015  . Hypertension   . Onychomycosis   . Osteoarthritis   . Type 2 diabetes mellitus (CMS/HHS-HCC)     Past Surgical History  Past Surgical History:  Procedure Laterality Date  . COLONOSCOPY   03/26/10    Social History Social History   Socioeconomic History  . Marital status: Married    Spouse name: Loraine Basden  . Number of children: 7  . Years of education: 78  . Highest education level: High school graduate  Occupational History  . Occupation: Retired  Tobacco Use  . Smoking status: Former    Current packs/day: 3.00    Average packs/day: 3.0 packs/day for 25.0 years (75.0 ttl pk-yrs)    Types: Cigarettes  . Smokeless tobacco: Never  Vaping Use  . Vaping status: Never Used  Substance and Sexual Activity  . Alcohol use: Yes    Comment: Occasional whiskey  . Drug use: Never  . Sexual activity: Yes    Partners: Female   Social Drivers of Corporate investment banker Strain: Low Risk  (11/25/2022)   Overall Financial Resource Strain (CARDIA)   . Difficulty of Paying Living Expenses: Not hard at all  Food Insecurity: No Food Insecurity (11/25/2022)   Hunger Vital Sign   . Worried About Programme researcher, broadcasting/film/video in the Last Year: Never true   . Ran Out of Food in the Last Year: Never true  Transportation Needs: No Transportation Needs (11/25/2022)   PRAPARE - Transportation   . Lack of Transportation (Medical): No   . Lack of Transportation (Non-Medical): No  Housing Stability: Unknown (11/25/2022)   Housing Stability Vital Sign   . Unable to Pay for Housing  in the Last Year: No   . Homeless in the Last Year: No    Family History Family History  Problem Relation Name Age of Onset  . Dementia Mother    . Cancer Father      Medication   Medication List       * Accurate as of November 25, 2022  8:07 AM. If you have any questions, ask your nurse or doctor.          CONTINUE taking these medications    ALPRAZolam  0.5 MG tablet Commonly known as: XANAX  TAKE 1 TABLET (0.5 MG TOTAL) BY MOUTH 3 (THREE) TIMES DAILY AS NEEDED FOR SLEEP   amLODIPine  10 MG tablet Commonly known as: NORVASC  TAKE 1 TABLET EVERY DAY   cholecalciferol  2,000 unit  tablet Commonly known as: VITAMIN D3   docusate 100 MG capsule Commonly known as: COLACE   donepeziL  10 MG tablet Commonly known as: ARICEPT  take 1 tablet by mouth everyday at bedtime   enalapril  20 MG tablet Commonly known as: VASOTEC  TAKE 1 TABLET TWICE DAILY   finasteride  5 mg tablet Commonly known as: PROSCAR  TAKE 1 TABLET ONE TIME DAILY   glipiZIDE  5 MG XL tablet Commonly known as: GLUCOTROL  XL TAKE 1 TABLET EVERY MORNING   hydrALAZINE  50 MG tablet Commonly known as: APRESOLINE  TAKE 1 TABLET THREE TIMES DAILY   metoprolol  tartrate 25 MG tablet Commonly known as: LOPRESSOR  TAKE 1 TABLET TWICE DAILY   sodium bicarbonate  650 MG tablet   tamsulosin  0.4 mg capsule Commonly known as: FLOMAX  TAKE 1 CAPSULE ONCE DAILY 30 MINUTES AFTER SAME MEAL EACH DAY.   VELTASSA 8.4 gram packet Generic drug: patiromer calcium sorbitex        Allergy No Known Allergies  Review of System A comprehensive ROS was negative except as aper HPI  Physical exam BP 134/76   Pulse 75   Ht 195.6 cm (6' 5)   Wt (!) 122.5 kg (270 lb)   SpO2 99%   BMI 32.02 kg/m   GENERAL:  The patient is alert, oriented and in no acute distress.  HEENT:  PERRLA.  Oropharynx moist without lesions.  Head is normocephalic/atraumatic.  Pupils equal, round and reactive to light and accommodation.  Extraocular movements are intact. Nasal mucosa is normal.  External ear canals normal.  Tympanic membranes are clear.   NECK:  Supple without thyromegaly or lymphadenopathy.   LUNGS:  Clear to auscultation and percussion.   CARDIAC:  Regular rhythm but bradycardic, 2/6 blowing SM VASCULAR: Carotid bruit on L Distal pulses are 2+.  LUE AVF in place ABDOMEN:  Soft, with normal bowel sounds.  No organomegaly or tenderness was found.   EXTREMITIES:  Full range of motion with no erythema, heat or effusions.  2+ edema to mid shin NEUROLOGIC:  The patient is alert and oriented.  Cranial nerves II-XII intact.    DERMATOLOGIC:  No skin lesions, scaling or bruising noted.    LYMPH:  No cervical or supraclavicular nodes.      Labs No visits with results within 6 Month(s) from this visit.  Latest known visit with results is:  Office Visit on 10/31/2020  Component Date Value Ref Range Status  . Protein Total - [facility] 10/31/2020 7.2  6.0 - 8.5 g/dL Final  . Albumin Electrophoresis - [facility] 10/31/2020 3.9  2.9 - 4.4 g/dL Final  . Alpha 1 - [facility] 10/31/2020 0.2  0.0 - 0.4 g/dL Final  . Alpha 2 - [facility] 10/31/2020 0.7  0.4 - 1.0 g/dL Final  . Beta Globulin - [facility] 10/31/2020 1.0  0.7 - 1.3 g/dL Final  . Gamma Globulin - [facility] 10/31/2020 1.4  0.4 - 1.8 g/dL Final  . M Component, Ur - [facility] 10/31/2020 Not Observed  Not Observed g/dL Final  . Globulin, Total - [facility] 10/31/2020 3.3  2.2 - 3.9 g/dL Final  . A/G Ratio - [facility] 10/31/2020 1.2  0.7 - 1.7 Final  . Please note: - [facility] 10/31/2020 Comment   Final  . Protein, Ur - [facility] 10/31/2020 25.6  Not Estab. mg/dL Final  . Albumin ELP, Urine - [facility] 10/31/2020 51.7  % Final  . Alpha-1-Globulin, Urine - [facility] 10/31/2020 4.4  % Final  . Alpha-2-Globulin, Urine - [facility] 10/31/2020 7.9  % Final  . Beta-Globulin - [facility] 10/31/2020 15.8  % Final  . Gamma Globulin, U - [facility] 10/31/2020 20.3  % Final  . M-Spike, % - [facility] 10/31/2020 Not Observed  Not Observed % Final  . Please note: - [facility] 10/31/2020 Comment   Final  . ANA Direct - [facility] 10/31/2020 Negative  Negative Final  . Glucose 10/31/2020 86  70 - 110 mg/dL Final  . Sodium 89/79/7977 140  136 - 145 mmol/L Final  . Potassium 10/31/2020 5.4 (H)  3.6 - 5.1 mmol/L Final  . Chloride 10/31/2020 115 (H)  97 - 109 mmol/L Final  . Carbon Dioxide (CO2) 10/31/2020 21.5 (L)  22.0 - 32.0 mmol/L Final  . Urea Nitrogen (BUN) 10/31/2020 66 (H)  7 - 25 mg/dL Final  . Creatinine 89/79/7977 3.2 (H)  0.7 - 1.3 mg/dL Final  . Glomerular Filtration Rate (eGFR) 10/31/2020 23 (L)  >60  mL/min/1.73sq m Final  . Calcium 10/31/2020 9.4  8.7 - 10.3 mg/dL Final  . AST  89/79/7977 14  8 - 39 U/L Final  . ALT  10/31/2020 13  6 - 57 U/L Final  . Alk Phos (alkaline Phosphatase) 10/31/2020 63  34 - 104 U/L Final  . Albumin 10/31/2020 4.2  3.5 - 4.8 g/dL Final  . Bilirubin, Total 10/31/2020 0.4  0.3 - 1.2 mg/dL Final  . Protein, Total 10/31/2020 7.3  6.1 - 7.9 g/dL Final  . A/G Ratio 89/79/7977 1.4  1.0 - 5.0 gm/dL Final  . Hemoglobin J8R 10/31/2020 5.6  4.2 - 5.6 % Final  . Average Blood Glucose (Calc) 10/31/2020 114  mg/dL Final  . Creatinine, Random Urine 10/31/2020 93.9  40.0 - 300.0 mg/dL Final  . Urine Albumin, Random 10/31/2020 102    mg/L Final  . Urine Albumin/Creatinine Ratio 10/31/2020 108.6 (H)  <30.0 ug/mg Final  . Anti-MPO Antibodies - [facility] 10/31/2020 <0.2  0.0 - 0.9 units Final  . Anti-PR3 Antibodies - [facility] 10/31/2020 <0.2  0.0 - 0.9 units Final  . Gytoplasmic (C-ANCA) - [facility] 10/31/2020 <1:20  Neg:<1:20 titer Final  . Perinuclear (P-ANCA) - [facility] 10/31/2020 <1:20  Neg:<1:20 titer Final  . Atypical pANCA - [facility] 10/31/2020 <1:20  Neg:<1:20 titer Final    Assessment  Jeffery Joseph is a 82 y.o. male here for  follow up. His daughter is Malcolm who worked for many years at [employer].  She accompanies him today.   Recommendations LE edema - echo - nml EF, mild valve regurg.  Is on norvasc . Not on diuretic. FU Dr Marcelino.  Likely due to CKD Lab Results  Component Value Date   CREATININE 3.2 (H) 10/31/2020   BUN 66 (H) 10/31/2020   NA 140 10/31/2020   K  5.4 (H) 10/31/2020   CL 115 (H) 10/31/2020   CO2 21.5 (L) 10/31/2020   Last three GFR values Lab Results  Component Value Date   GFR 23 (L) 10/31/2020   GFR 33 (L) 05/04/2019   GFR 34 (L) 11/02/2017   Memory issues- fam hx of AD in his mother. MMSE in 2021 28/30. Cont aricept  10 mg   OA - bil knees. Discussed xray and possible steroid injections but he and family decline.  He would not want surgery or  synvisc either. Discussed avoiding nsaids, cont tylenol .  Diabetes - mild PN - sugars well controlled at home with current medications.  No episodes of  hypoglycemia.   Lab Results  Component Value Date   HGBA1C 5.6 10/31/2020    Hypertension - BP stable  on current regimen.  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 Following with Dr. Marcelino. Dr. Ike had noted there is some calcification at one of the renal arteries and this could be stented if his renal function worsens.  Anemia of chronic kidney disease. This is stable.  Most recent labs from renal showed a hemoglobin of 11.4.  No indication for Procrit Lab Results  Component Value Date   WBC 4.7 05/04/2019   HGB 12.4 (L) 05/04/2019   HCT 40.3 05/04/2019   MCV 86.1 05/04/2019   PLT 192 05/04/2019   GAD- long term oxazepam but not available so changed to xanax  0.5 BID to TID and stable  BPH. Remains on finasteride  and flomax . Follows yearly with urology  Pulmonary hypertension. He had a right heart cath by Dr. Bosie and this is stable.   Alm SQUIBB.Epifanio, MD     *Some images could not be shown.

## 2022-12-10 IMAGING — US US RENAL
1 series · 15 of 25 positions shown · non-contrast
Comparison: None.

CLINICAL DATA: Chronic kidney disease, stage 4 (severe) (HCC)

EXAM:
RENAL / URINARY TRACT ULTRASOUND COMPLETE

[Series 1: us renal · 15 of 59 slices shown]
[im 1/59]
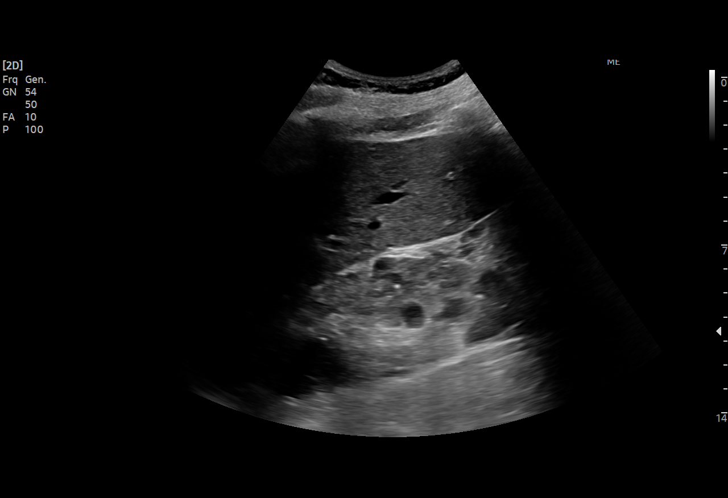
[im 5/59]
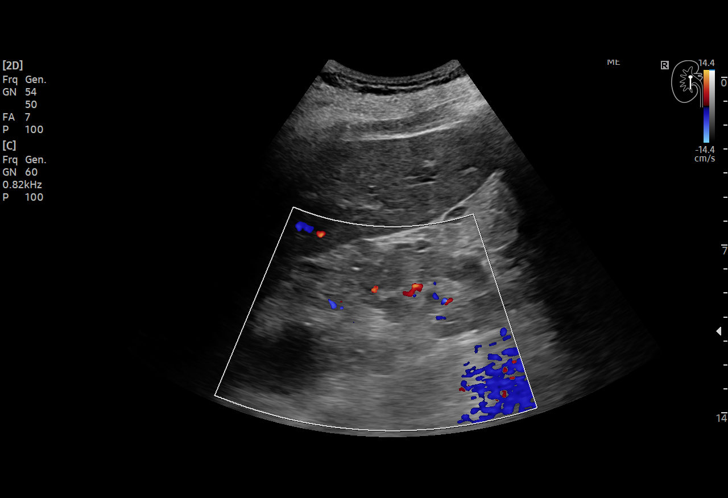
[im 10/59]
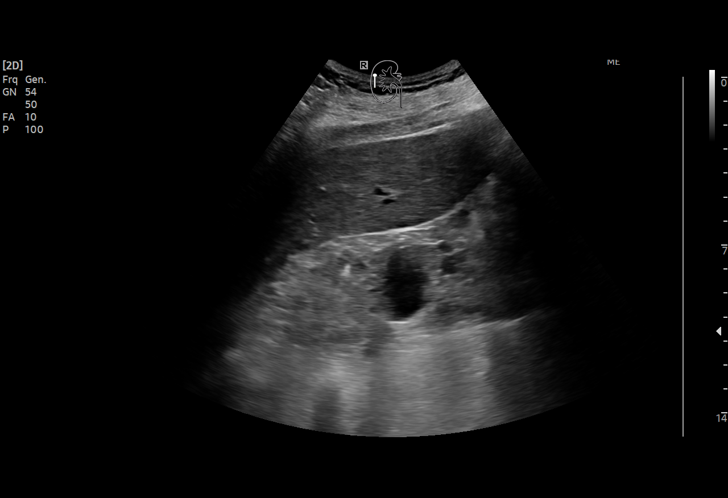
[im 13/59]
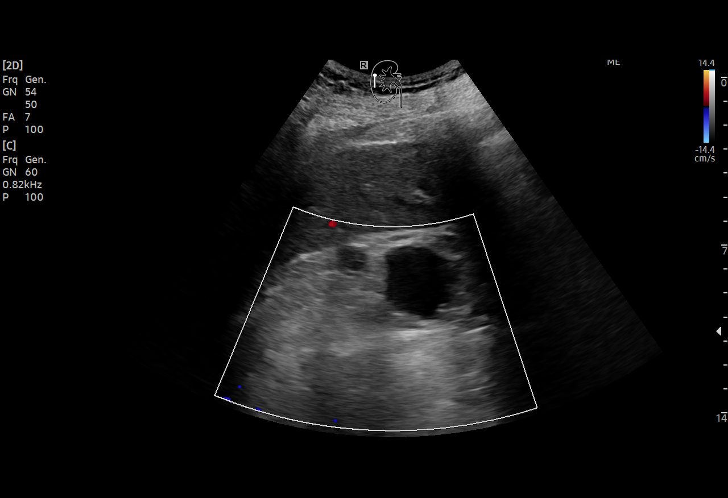
[im 17/59]
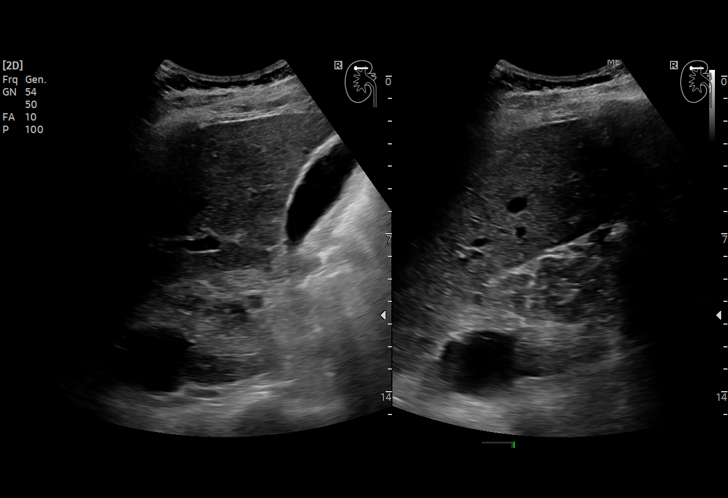
[im 22/59]
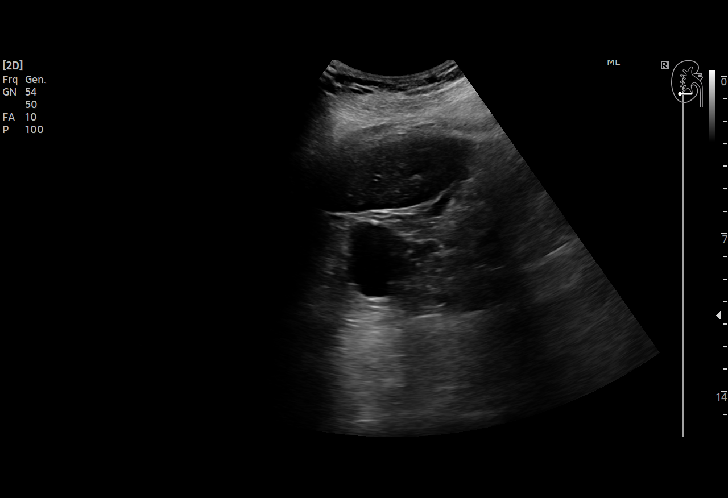
[im 25/59]
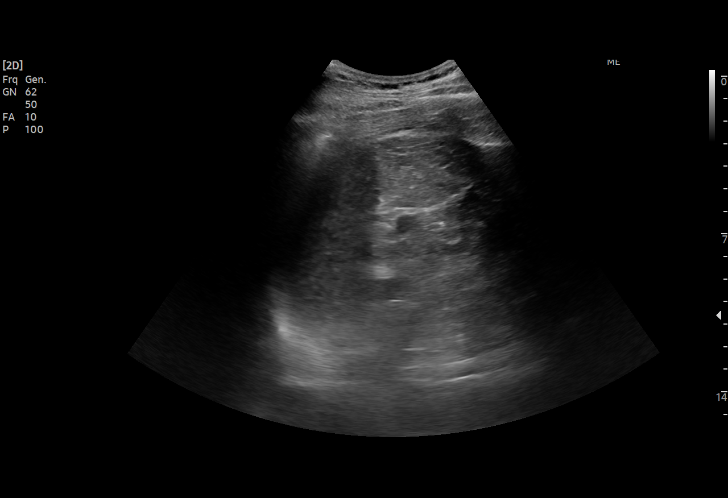
[im 30/59]
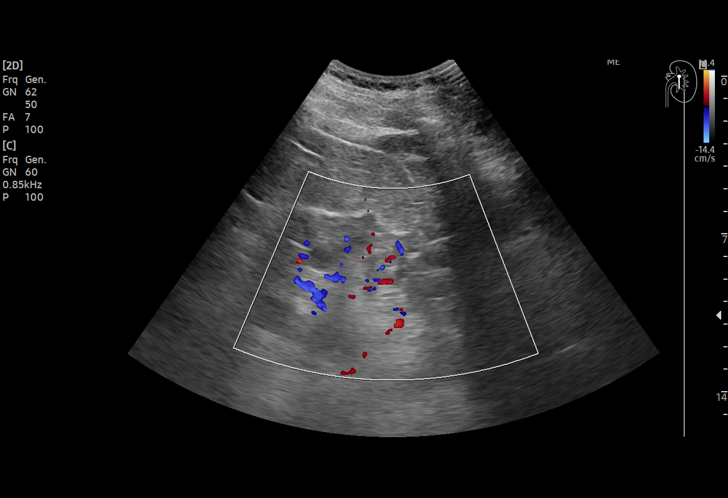
[im 34/59]
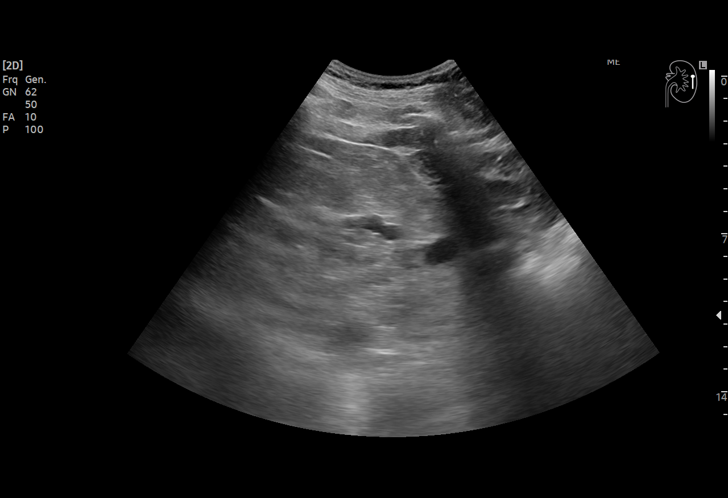
[im 37/59]
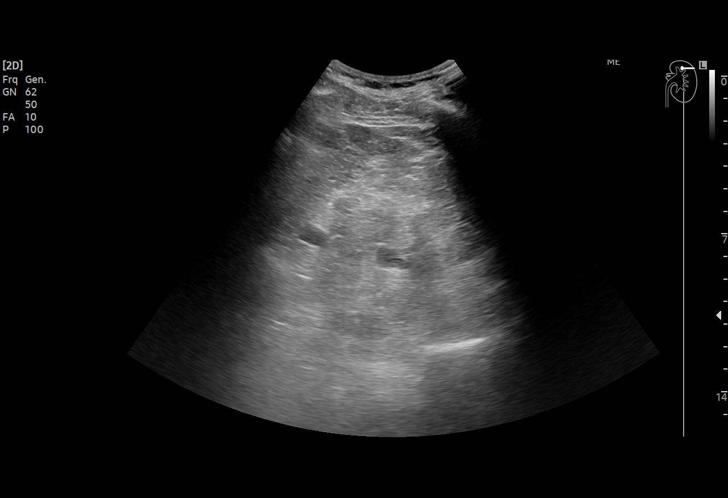
[im 42/59]
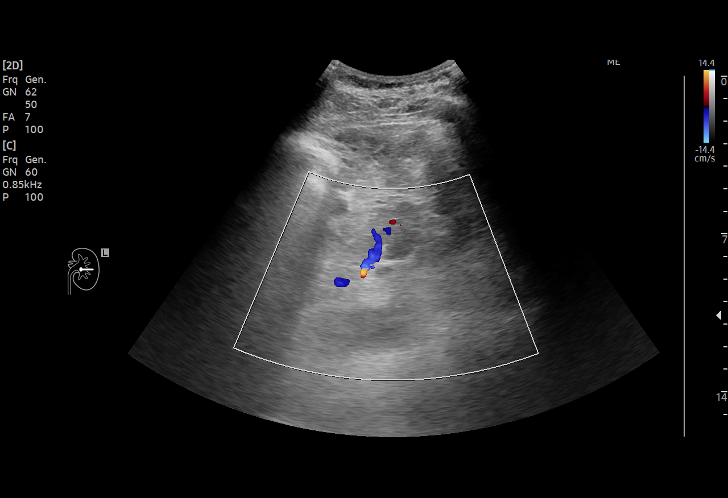
[im 46/59]
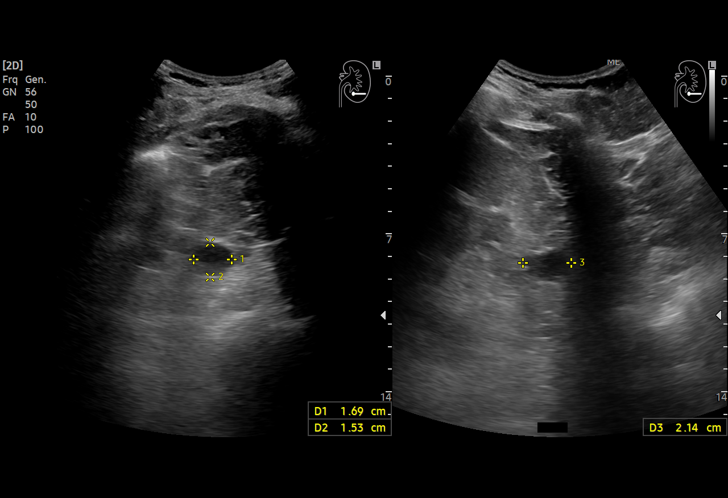
[im 49/59]
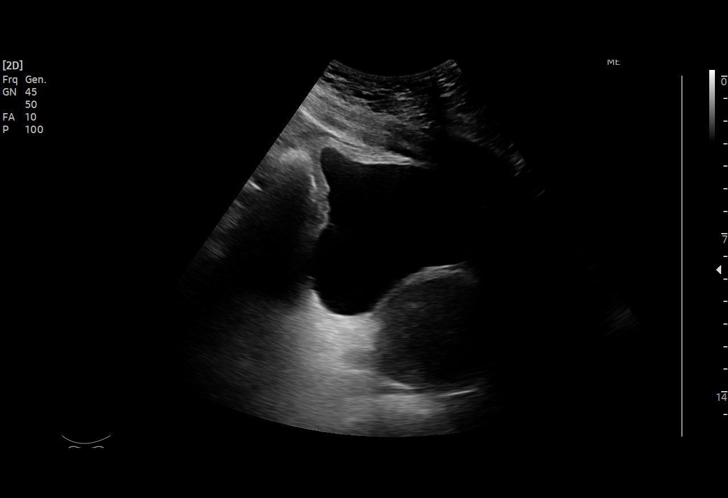
[im 54/59]
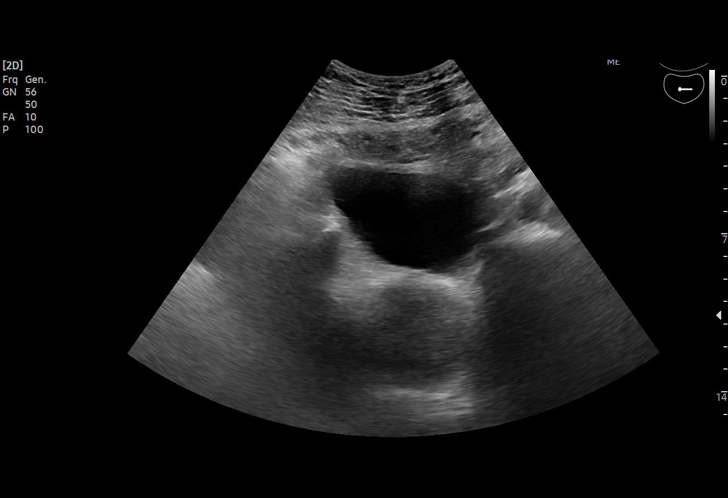
[im 59/59]
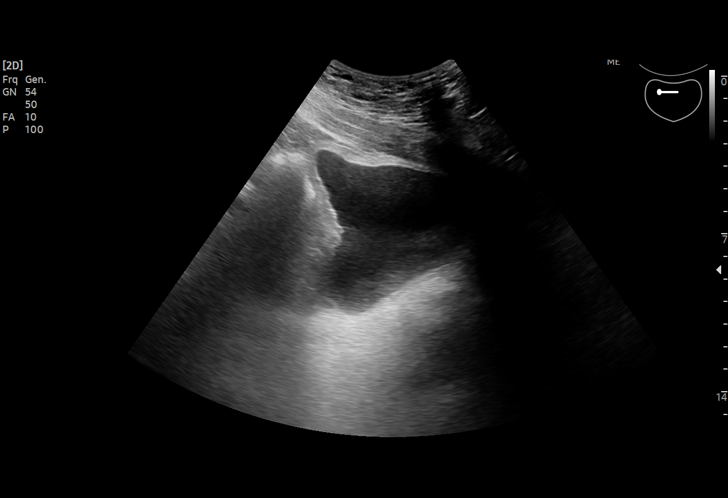

[15 of 25 positions shown; findings below may reference images not displayed]

FINDINGS: Right Kidney:

Renal measurements: 10.3 x 5.4 x 6 cm = volume: 176 mL. Diffusely
increased renal echogenicity. No hydronephrosis. There are
innumerable anechoic masses with posterior acoustic enhancement most
consistent with cysts. Dominant cyst is in the interpolar kidney and
measures 28 x 25 x 32 mm. Evaluation for masses is limited due to
renal echogenicity.

Left Kidney:

Renal measurements: 11.9 x 6.1 x 5.7 cm = volume: 213 mL. Diffusely
increased renal echogenicity. No hydronephrosis. There are
innumerable anechoic masses with posterior acoustic enhancement most
consistent with cysts. Dominant cyst is in the inferior kidney and
measures 17 x 15 x 21 mm. Evaluation for masses is limited due to
renal echogenicity.

Bladder:

Appears normal for degree of bladder distention.

Other:

Prostatomegaly
IMPRESSION: 1. Diffusely increased renal echogenicity as can be seen in medical
renal disease.
2. Prostatomegaly.

## 2023-01-20 NOTE — Progress Notes (Signed)
 Follow Up Visit   Patient Name: Jeffery Joseph, male   Patient DOB: [birth year] Date of Service: 01/20/2023  Patient MRN: [id number] Provider Creating Note: Bonnell Sherry, MD  [phone] Primary Care Physician:   [address] [place] [address] Additional Physicians/ Providers:    History of Present Illness Jeffery Joseph is a 83 y.o. male who is following up today for chronic kidney disease stage IV, proteinuria, hypertension, anemia of chronic kidney disease, and hyperkalemia.  The patient chronic kidney disease appears to be stable most recent EGFR 20 with urine protein creatinine ratio 0.2.  In regards hypertension blood pressure currently 143/70.  Patient also has anemia of chronic kidney disease her most recent hemoglobin 10.1.  Patient also has secondary hyperparathyroidism with most recent PTH of 21, phosphorus 4.4, calcium 10.5.  Patient also has history of hyperkalemia on Veltassa with most recent serum potassium controlled at 4.5.  Medications   Current Outpatient Medications:  .  ALPRAZolam  (XANAX ) 0.5 MG tablet, TAKE 1 TABLET (0.5 MG TOTAL) BY MOUTH 3 (THREE) TIMES DAILY AS NEEDED FOR SLEEP FOR UP TO 30 DAYS, Disp: , Rfl:  .  amLODIPine  (NORVASC ) 10 MG tablet, Comments:   Filled Date: Oct 06 2016  8:24AM  Patient Notes: TAKE 1 TABLET ONE TIME DAILY FOR HIGH BLOOD PRESSURE  Duration: 90, Disp: , Rfl:  .  Cholecalciferol  (Vitamin D -3) 25 MCG (1000 UT) capsule, Take 2 capsules by mouth 1 (one) time each day, Disp: , Rfl:  .  donepezil  (ARICEPT ) 10 MG tablet, Take 10 mg by mouth in the morning., Disp: , Rfl:  .  enalapril  (VASOTEC ) 20 MG tablet, Comments:   Filled Date: Jul 10 2014  1:42PM  Patient Notes: TAKE 1 TABLET TWICE DAILY  Duration: 90, Disp: , Rfl:  .  finasteride  (PROSCAR ) 5 MG tablet, Take 5 mg by mouth, Disp: , Rfl:  .  furosemide  (Lasix ) 40 MG tablet, Take 1 tablet (40 mg total) by mouth every other day, Disp: 45 tablet, Rfl: 3 .  glipiZIDE  (GLUCOTROL  XL) 5 MG 24 hr  tablet, Take 5 mg by mouth, Disp: , Rfl:  .  hydrALAZINE  (APRESOLINE ) 50 MG tablet, Comments:   Filled Date: Jul 10 2014  1:42PM  Patient Notes: TAKE 1 TABLET BY MOUTH THREE TIMES A DAY  Duration: 90, Disp: , Rfl:  .  metoprolol  tartrate (LOPRESSOR ) 25 MG tablet, Take 25 mg by mouth, Disp: , Rfl:  .  Patiromer Sorbitex Calcium (Veltassa) 8.4 g pack, Take 8.4 g by mouth in the morning., Disp: 30 each, Rfl: 11 .  sodium bicarbonate  650 MG tablet, Take 1 tablet (650 mg total) by mouth in the morning and 1 tablet (650 mg total) in the evening., Disp: 60 tablet, Rfl: 11 .  tamsulosin  (FLOMAX ) 0.4 MG 24 hr capsule, Take 0.4 mg by mouth, Disp: , Rfl:    Allergies Patient has no known allergies.  Problem List Patient Active Problem List  Diagnosis  . Stage 3 chronic kidney disease (HCC)  . Benign essential hypertension  . Congenital multiple renal cysts  . Vitamin D  deficiency  . Anemia in chronic kidney disease     Review of Systems  Constitutional:  Negative for chills and fever.  Respiratory:  Negative for cough and shortness of breath.   Cardiovascular:  Positive for leg swelling. Negative for chest pain and palpitations.  Gastrointestinal:  Negative for nausea and vomiting.  Genitourinary:  Negative for dysuria, hematuria and urgency.  History Past Medical History:  Diagnosis Date  . Anemia in chronic kidney disease 10/27/2018  . Anxiety   . Benign essential hypertension 10/27/2018  . Chronic kidney disease, stage 3 unspecified 10/27/2018  . Congenital multiple renal cysts 10/27/2018  . Diabetes mellitus (HCC)   . Osteoarthrosis, unspecified whether generalized or localized, involving unspecified site   . Vitamin D  deficiency 10/27/2018    History reviewed. No pertinent surgical history. Family History  Problem Relation Age of Onset  . Hypertension Mother   . Dementia Mother    Social History   Tobacco Use  . Smoking status: Former    Current packs/day: 0.00     Types: Cigarettes    Quit date: 01/12/1958    Years since quitting: 65.0  . Smokeless tobacco: Not on file  Substance Use Topics  . Alcohol use: Not on file        Physical Exam  Vitals BP 143/70 (BP Location: Right upper arm, Patient Position: Sitting)   Pulse 60   Temp 98.2 F   Wt 267 lb (121 kg)   SpO2 97%   BMI 31.66 kg/m   PHYSICAL EXAM: General appearance: well developed, well nourished, NAD Eyes: anicteric sclerae, moist conjunctivae; no lid-lag  HENT: Atraumatic; hearing intact Neck: Trachea midline; supple Lungs: CTAB, with normal respiratory effort  CV: S1S2, 2/6 SEM Abdomen: Soft, non-tender; bowel sounds positive Extremities: 1+ lower extremity edema Skin: Warm and dry, normal skin turgor, no rashes noted. Psych: Appropriate affect, alert and oriented to person, place and time  Access: Left upper extremity AV fistula with good bruit and thrill.   Laboratory Studies  Chemistry  Lab Units 11/11/22 1003 09/30/22 1030 08/26/22 1011 06/24/22 1001 05/11/22 1034 04/01/22 1426  SODIUM mmol/L 142 142 141 141 143 142  POTASSIUM mmol/L 4.5 4.8 5.6* 4.8 4.4 4.9  CHLORIDE mmol/L 108 112* 114* 111* 111* 119*  CO2 mmol/L 26 23 19* 20 24 12*  CALCIUM mg/dL 89.4* 9.8 9.7 9.5 9.2 9.6  PHOSPHORUS mg/dL 4.4* 3.3 4.3 4.1 3.7 4.1  PTH pg/mL 21 46 37 60 74 30  GLUCOSE mg/dL 92 83 87 82 60* 81  ALBUMIN g/dL 4.2 4.1 4.2 4.1 4.1 3.8  BUN mg/dL 51* 53* 66* 71* 52* 63*  CREATININE mg/dL 6.92* 6.80* 6.59* 6.17* 3.12* 3.28*        No lab exists for component: IRON SATURATION, TRANSSATPER  CBC  Lab Units 11/11/22 1003 09/30/22 1030 08/26/22 1011 06/24/22 1001 05/11/22 1034 04/01/22 1426  WBC AUTO Thousand/uL 6.1 5.0 4.1 5.4 5.2 4.7  HEMOGLOBIN g/dL 89.8* 9.4* 88.6* 88.0* 11.6* 11.0*  HEMATOCRIT % 34.7* 31.3* 36.4* 38.4* 37.6* 35.8*  MCV fL 90.6 92.3 88.1 87.7 88.7 89.5  PLATELETS AUTO Thousand/uL 220 154 140 167 179 164    Urine  Lab Units 11/11/22 1003  09/30/22 1030 08/26/22 1011 08/04/21 1036 05/20/21 0904  PROT/CREAT RATIO UR mg/g creat 0.286*  286* 0.352*  352* 0.388*  388*   < >  --   ALB MG/G CREAT UR mcg/mg creat  --   --   --   --  28   < > = values in this interval not displayed.    Lab Results  Component Value Date   PTH 21 11/11/2022   CALCIUM 10.5 (H) 11/11/2022   PHOS 4.4 (H) 11/11/2022     Imaging and Other Studies     Orders Placed This Encounter  . Renal Function Panel  . CBC and Differential  .  PTH, Intact  . Protein, Total, Random Urine w/Creatinine (Protein/Creat Ratio)        Impression/Recommendations  Jeffery Joseph is a 83 y.o. male with past medical history of diabetes mellitus, extensive NSAID use, hypertension, BPH, anxiety, osteoarthritis, fistula placement 11/14/2021 who returns for followup of chronic kidney disease.  1. Chronic kidney disease stage IV/proteinuria.  The patient continues to demonstrate stable but advanced chronic kidney disease.  Most recent EGFR was 20 with urine protein creatinine ratio of 0.2.  He has a developed AV fistula.  No immediate need for dialysis.  Maintain the patient on enalapril  and follow-up renal parameters today.  2.  Hypertension.  Blood pressure under reasonable control at 143/70.  Maintain the patient on amlodipine , enalapril , hydralazine , and metoprolol .  3.  Anemia of chronic kidney disease.  Most recent hemoglobin was at target at 10.1.  Continue to monitor CBC.  4.  Secondary hyperparathyroidism.  Most recent PTH was 21, phosphorus 4.4, calcium 10.5.  No immediate need for calcitriol as serum calcium slightly high.  5.  Hyperkalemia.  Serum potassium at target at 4.5.  Continue to monitor serum potassium and maintain the patient on current doses of Veltassa.   Return in about 8 weeks (around 03/17/2023).   Munsoor Lateef, MD

## 2023-03-17 NOTE — Progress Notes (Signed)
 Follow Up Visit   Patient Name: Jeffery Joseph, male   Patient DOB: 1940/11/13 Date of Service: 03/17/2023  Patient MRN: [id number] Provider Creating Note: Bonnell Sherry, MD  [phone] Primary Care Physician:   [address] [place] [address] Additional Physicians/ Providers:    History of Present Illness Jeffery Joseph is a 83 y.o. male who is following up today for chronic kidney disease stage IV, proteinuria, hypertension, anemia of chronic kidney disease, and hyperkalemia.  The patient's chronic kidney disease appears to be stable most recent EGFR 19 with urine protein creatinine ratio 0.8.  In regards hypertension blood pressure currently 140/80.  Patient also has anemia of chronic kidney disease most recent hemoglobin of 10.5.  Patient also has secondary hyperparathyroidism with most recent PTH of 81, phosphorus 4.0, calcium 9.1.  Patient also has history of hyperkalemia with most recent serum potassium of 4.3.  Medications   Current Outpatient Medications:  .  ALPRAZolam  (XANAX ) 0.5 MG tablet, TAKE 1 TABLET (0.5 MG TOTAL) BY MOUTH 3 (THREE) TIMES DAILY AS NEEDED FOR SLEEP FOR UP TO 30 DAYS, Disp: , Rfl:  .  amLODIPine  (NORVASC ) 10 MG tablet, Comments:   Filled Date: Oct 06 2016  8:24AM  Patient Notes: TAKE 1 TABLET ONE TIME DAILY FOR HIGH BLOOD PRESSURE  Duration: 90, Disp: , Rfl:  .  Cholecalciferol  (Vitamin D -3) 25 MCG (1000 UT) capsule, Take 2 capsules by mouth 1 (one) time each day, Disp: , Rfl:  .  donepezil  (ARICEPT ) 10 MG tablet, Take 10 mg by mouth in the morning., Disp: , Rfl:  .  enalapril  (VASOTEC ) 20 MG tablet, Comments:   Filled Date: Jul 10 2014  1:42PM  Patient Notes: TAKE 1 TABLET TWICE DAILY  Duration: 90, Disp: , Rfl:  .  finasteride  (PROSCAR ) 5 MG tablet, Take 5 mg by mouth, Disp: , Rfl:  .  furosemide  (Lasix ) 40 MG tablet, Take 1 tablet (40 mg total) by mouth every other day, Disp: 45 tablet, Rfl: 3 .  glipiZIDE  (GLUCOTROL  XL) 5 MG 24 hr tablet, Take 5 mg by mouth,  Disp: , Rfl:  .  hydrALAZINE  (APRESOLINE ) 50 MG tablet, Comments:   Filled Date: Jul 10 2014  1:42PM  Patient Notes: TAKE 1 TABLET BY MOUTH THREE TIMES A DAY  Duration: 90, Disp: , Rfl:  .  metoprolol  tartrate (LOPRESSOR ) 25 MG tablet, Take 25 mg by mouth, Disp: , Rfl:  .  sodium bicarbonate  650 MG tablet, Take 1 tablet (650 mg total) by mouth in the morning and 1 tablet (650 mg total) in the evening., Disp: 60 tablet, Rfl: 11 .  Sodium Zirconium Cyclosilicate 10 g pack, Take 10 g by mouth 1 (one) time each day, Disp: 30 each, Rfl: 11 .  tamsulosin  (FLOMAX ) 0.4 MG 24 hr capsule, Take 0.4 mg by mouth, Disp: , Rfl:    Allergies Patient has no known allergies.  Problem List Patient Active Problem List  Diagnosis  . Stage 3 chronic kidney disease (HCC)  . Benign essential hypertension  . Congenital multiple renal cysts  . Vitamin D  deficiency  . Anemia in chronic kidney disease     Review of Systems  Constitutional:  Negative for chills and fever.  Respiratory:  Negative for cough and shortness of breath.   Cardiovascular:  Positive for leg swelling. Negative for chest pain and palpitations.  Gastrointestinal:  Negative for nausea and vomiting.  Genitourinary:  Negative for dysuria, hematuria and urgency.     History Past  Medical History:  Diagnosis Date  . Anemia in chronic kidney disease 10/27/2018  . Anxiety   . Benign essential hypertension 10/27/2018  . Chronic kidney disease, stage 3 unspecified 10/27/2018  . Congenital multiple renal cysts 10/27/2018  . Diabetes mellitus (HCC)   . Osteoarthrosis, unspecified whether generalized or localized, involving unspecified site   . Vitamin D  deficiency 10/27/2018    History reviewed. No pertinent surgical history. Family History  Problem Relation Age of Onset  . Hypertension Mother   . Dementia Mother    Social History   Tobacco Use  . Smoking status: Former    Current packs/day: 0.00    Types: Cigarettes    Quit  date: 01/12/1958    Years since quitting: 65.2  . Smokeless tobacco: Not on file  Substance Use Topics  . Alcohol use: Not on file        Physical Exam  Vitals BP 140/80 (BP Location: Right upper arm, Patient Position: Sitting)   Pulse 60   Temp 97.1 F   Wt 276 lb (125 kg)   SpO2 96%   BMI 32.73 kg/m   PHYSICAL EXAM: General appearance: well developed, well nourished, NAD Eyes: anicteric sclerae, moist conjunctivae; no lid-lag  HENT: Atraumatic; hearing intact Neck: Trachea midline; supple Lungs: CTAB, with normal respiratory effort  CV: S1S2, 2/6 SEM Abdomen: Soft, non-tender; bowel sounds positive Extremities: 2+ lower extremity edema Skin: Warm and dry, normal skin turgor, no rashes noted. Psych: Appropriate affect, alert and oriented to person, place and time  Access: Left upper extremity AV fistula with good bruit and thrill.   Laboratory Studies  Chemistry  Lab Units 03/10/23 0907 01/20/23 1013 11/11/22 1003 09/30/22 1030 08/26/22 1011 06/24/22 1001  SODIUM mmol/L 142 141 142 142 141 141  POTASSIUM mmol/L 4.3 4.5 4.5 4.8 5.6* 4.8  CHLORIDE mmol/L 107 109 108 112* 114* 111*  CO2 mmol/L 26 21 26 23  19* 20  CALCIUM mg/dL 9.1 9.6 89.4* 9.8 9.7 9.5  PHOSPHORUS mg/dL 4.0 4.0 4.4* 3.3 4.3 4.1  PTH pg/mL 81* 43 21 46 37 60  GLUCOSE mg/dL 92 84 92 83 87 82  ALBUMIN g/dL 3.9 4.0 4.2 4.1 4.2 4.1  BUN mg/dL 47* 49* 51* 53* 66* 71*  CREATININE mg/dL 6.86* 6.72* 6.92* 6.80* 3.40* 3.82*        No lab exists for component: IRON SATURATION, TRANSSATPER  CBC  Lab Units 03/10/23 0907 01/20/23 1013 11/11/22 1003 09/30/22 1030 08/26/22 1011 06/24/22 1001  WBC AUTO Thousand/uL 5.2 5.1 6.1 5.0 4.1 5.4  HEMOGLOBIN g/dL 89.4* 89.1* 89.8* 9.4* 11.3* 11.9*  HEMOGLOBIN URINE  NEGATIVE  --   --   --   --   --   HEMATOCRIT % 34.9* 36.6* 34.7* 31.3* 36.4* 38.4*  MCV fL 84.5 84.3 90.6 92.3 88.1 87.7  PLATELETS AUTO Thousand/uL 188 211 220 154 140 167    Urine   Lab Units 03/10/23 0907 01/20/23 1013 11/11/22 1003 08/04/21 1036 05/20/21 0904  COLOR U  YELLOW  --   --   --   --   KETONES U MG/DL  NEGATIVE  --   --   --   --   PROT/CREAT RATIO UR mg/g creat 0.813*  813* 0.382*  382* 0.286*  286*   < >  --   ALB MG/G CREAT UR mcg/mg creat  --   --   --   --  28   < > = values in this interval not displayed.  Lab Results  Component Value Date   PTH 81 (H) 03/10/2023   CALCIUM 9.1 03/10/2023   PHOS 4.0 03/10/2023     Imaging and Other Studies     Orders Placed This Encounter  . Renal Function Panel  . CBC and Differential  . PTH, Intact  . Protein, Total, Random Urine w/Creatinine (Protein/Creat Ratio)        Impression/Recommendations  Jeffery Joseph is a 83 y.o. male with past medical history of diabetes mellitus, extensive NSAID use, hypertension, BPH, anxiety, osteoarthritis, fistula placement 11/14/2021 who returns for followup of chronic kidney disease.  1. Chronic kidney disease stage IV/proteinuria.  The patient's chronic kidney disease appears to be stable most recent EGFR 19 with urine protein creatinine ratio 0.8.  No immediate need for dialysis.  His AV fistula has developed nicely.  Follow-up renal parameters prior to the next visit.  Maintain the patient on enalapril  for renal protection and proteinuria suppression.  2.  Hypertension.  Blood pressure currently 140/80.  Maintain the patient on amlodipine , enalapril , hydralazine , metoprolol  hypertension control.  3.  Anemia of chronic kidney disease.  Most recent hemoglobin was 10.5.  Continue to monitor CBC.  4.  Secondary hyperparathyroidism.  Most recent PTH was 81, phosphorus 4.0, calcium 9.1.  Maintain the patient on cholecalciferol .  5.  Hyperkalemia.  Patient had to be switched to [employer] as his insurance would not pay for [employer].  Most recent serum potassium was 4.1.  Slightly more prominent edema today.  We will need to monitor this.   Return in about 8  weeks (around 05/12/2023).   Munsoor Lateef, MD

## 2023-03-24 NOTE — Progress Notes (Signed)
 Chief Complaint: Patient returns for diabetic foot care. Some continued paresthesias.   Exam Skin is dry and atrophic with bilateral edema. Diminished hair growth.  All 10 toenails are thick, dystrophic and discolored, brittle with subungual debris. Hyperkeratosis is noted at the head of the proximal phalanx on the right fifth toe and beneath the right first metatarsal head.  No sign of ulceration.       Impression: 1.  Diabetes with onychomycosis.  2.  Hyperkeratosis multiple, right foot.     Plan: Debridement of all 10 toenails in length and thickness sharply using toenail nippers.  Paring of the lesions. Return to clinic as needed.   TODD ELSIE ROSELLA, DPM

## 2023-05-19 NOTE — Progress Notes (Signed)
 Follow Up Visit   Patient Name: Jeffery Joseph, male   Patient DOB: 22-Nov-1940 Date of Service: 05/19/2023  Patient MRN: [id number] Provider Creating Note: Bonnell Sherry, MD  [phone] Primary Care Physician:   [address] [place] [address] Additional Physicians/ Providers:    History of Present Illness WILFRED DAYRIT is a 83 y.o. male who is following up today for chronic kidney disease stage IV, proteinuria, hypertension, anemia of chronic kidney disease, and hyperkalemia.  The patient chronic kidney disease appears to be stable most recent EGFR 17 mL urine protein creatinine ratio 0.8.  In regards hypertension blood pressure currently 160/68.  He also has anemia of chronic kidney disease most recent hemoglobin 11.1.  Patient also has secondary hyperparathyroidism with most recent PTH of 119, phosphorus 3.8, calcium 9.2.  Finally also has history of hyperkalemia with most recent serum potassium of 4.7.  Medications   Current Outpatient Medications:  .  ALPRAZolam  (XANAX ) 0.5 MG tablet, TAKE 1 TABLET (0.5 MG TOTAL) BY MOUTH 3 (THREE) TIMES DAILY AS NEEDED FOR SLEEP FOR UP TO 30 DAYS, Disp: , Rfl:  .  amLODIPine  (NORVASC ) 10 MG tablet, Comments:   Filled Date: Oct 06 2016  8:24AM  Patient Notes: TAKE 1 TABLET ONE TIME DAILY FOR HIGH BLOOD PRESSURE  Duration: 90, Disp: , Rfl:  .  Cholecalciferol  (Vitamin D -3) 25 MCG (1000 UT) capsule, Take 2 capsules by mouth 1 (one) time each day, Disp: , Rfl:  .  donepezil  (ARICEPT ) 10 MG tablet, Take 10 mg by mouth in the morning., Disp: , Rfl:  .  enalapril  (VASOTEC ) 20 MG tablet, Comments:   Filled Date: Jul 10 2014  1:42PM  Patient Notes: TAKE 1 TABLET TWICE DAILY  Duration: 90, Disp: , Rfl:  .  finasteride  (PROSCAR ) 5 MG tablet, Take 5 mg by mouth, Disp: , Rfl:  .  furosemide  (Lasix ) 40 MG tablet, Take 1 tablet (40 mg total) by mouth every other day, Disp: 45 tablet, Rfl: 3 .  glipiZIDE  (GLUCOTROL  XL) 5 MG 24 hr tablet, Take 5 mg by mouth, Disp: ,  Rfl:  .  hydrALAZINE  (APRESOLINE ) 50 MG tablet, Comments:   Filled Date: Jul 10 2014  1:42PM  Patient Notes: TAKE 1 TABLET BY MOUTH THREE TIMES A DAY  Duration: 90, Disp: , Rfl:  .  metoprolol  tartrate (LOPRESSOR ) 25 MG tablet, Take 25 mg by mouth, Disp: , Rfl:  .  sodium bicarbonate  650 MG tablet, Take 1 tablet (650 mg total) by mouth in the morning and 1 tablet (650 mg total) in the evening., Disp: 60 tablet, Rfl: 11 .  Sodium Zirconium Cyclosilicate 10 g pack, Take 10 g by mouth 1 (one) time each day, Disp: 30 each, Rfl: 11 .  tamsulosin  (FLOMAX ) 0.4 MG 24 hr capsule, Take 0.4 mg by mouth, Disp: , Rfl:    Allergies Patient has no known allergies.  Problem List Patient Active Problem List  Diagnosis  . Stage 3 chronic kidney disease (HCC)  . Benign essential hypertension  . Congenital multiple renal cysts  . Vitamin D  deficiency  . Anemia in chronic kidney disease     Review of Systems  Constitutional:  Negative for chills and fever.  Respiratory:  Negative for cough and shortness of breath.   Cardiovascular:  Positive for leg swelling. Negative for chest pain and palpitations.  Gastrointestinal:  Negative for nausea and vomiting.  Genitourinary:  Negative for dysuria, hematuria and urgency.     History Past Medical  History:  Diagnosis Date  . Anemia in chronic kidney disease 10/27/2018  . Anxiety   . Benign essential hypertension 10/27/2018  . Chronic kidney disease, stage 3 unspecified 10/27/2018  . Congenital multiple renal cysts 10/27/2018  . Diabetes mellitus (HCC)   . Osteoarthrosis, unspecified whether generalized or localized, involving unspecified site   . Vitamin D  deficiency 10/27/2018    History reviewed. No pertinent surgical history. Family History  Problem Relation Age of Onset  . Hypertension Mother   . Dementia Mother    Social History   Tobacco Use  . Smoking status: Former    Current packs/day: 0.00    Types: Cigarettes    Quit date:  01/12/1958    Years since quitting: 65.3  . Smokeless tobacco: Not on file  Substance Use Topics  . Alcohol use: Not on file        Physical Exam  Vitals BP 160/68 (BP Location: Right upper arm, Patient Position: Sitting)   Pulse 56   Temp 98.2 F   Wt 270 lb (122 kg)   SpO2 97%   BMI 32.02 kg/m   PHYSICAL EXAM: General appearance: well developed, well nourished, NAD Eyes: anicteric sclerae, moist conjunctivae; no lid-lag  HENT: Atraumatic; hearing intact Neck: Trachea midline; supple Lungs: CTAB, with normal respiratory effort  CV: S1S2, 2/6 SEM Abdomen: Soft, non-tender; bowel sounds positive Extremities: 2+ lower extremity edema Skin: Warm and dry, normal skin turgor, no rashes noted. Psych: Appropriate affect, alert and oriented to person, place and time  Access: Left upper extremity AV fistula with good bruit and thrill.   Laboratory Studies  Chemistry  Lab Units 05/12/23 0907 03/10/23 0907 01/20/23 1013 11/11/22 1003 09/30/22 1030 08/26/22 1011  SODIUM mmol/L 140 142 141 142 142 141  POTASSIUM mmol/L 4.7 4.3 4.5 4.5 4.8 5.6*  CHLORIDE mmol/L 106 107 109 108 112* 114*  CO2 mmol/L 24 26 21 26 23  19*  CALCIUM mg/dL 9.2 9.1 9.6 89.4* 9.8 9.7  PHOSPHORUS mg/dL 3.8 4.0 4.0 4.4* 3.3 4.3  PTH pg/mL 119* 81* 43 21 46 37  GLUCOSE mg/dL 898 92 84 92 83 87  ALBUMIN g/dL 4.0 3.9 4.0 4.2 4.1 4.2  BUN mg/dL 48* 47* 49* 51* 53* 66*  CREATININE mg/dL 6.55* 6.86* 6.72* 6.92* 3.19* 3.40*        No lab exists for component: IRON SATURATION, TRANSSATPER  CBC  Lab Units 05/12/23 0907 03/10/23 0907 01/20/23 1013 11/11/22 1003 09/30/22 1030 08/26/22 1011  WBC AUTO Thousand/uL 4.9 5.2 5.1 6.1 5.0 4.1  HEMOGLOBIN g/dL 88.8* 89.4* 89.1* 89.8* 9.4* 11.3*  HEMOGLOBIN URINE   --  NEGATIVE  --   --   --   --   HEMATOCRIT % 36.4* 34.9* 36.6* 34.7* 31.3* 36.4*  MCV fL 85.2 84.5 84.3 90.6 92.3 88.1  PLATELETS AUTO Thousand/uL 184 188 211 220 154 140    Urine  Lab  Units 05/12/23 0907 03/10/23 0907 01/20/23 1013 08/04/21 1036 05/20/21 0904  COLOR U   --  YELLOW  --   --   --   KETONES U MG/DL   --  NEGATIVE  --   --   --   PROT/CREAT RATIO UR mg/g creat 0.803*  803* 0.813*  813* 0.382*  382*   < >  --   ALB MG/G CREAT UR mcg/mg creat  --   --   --   --  28   < > = values in this interval not displayed.  Lab Results  Component Value Date   PTH 119 (H) 05/12/2023   CALCIUM 9.2 05/12/2023   PHOS 3.8 05/12/2023     Imaging and Other Studies     Orders Placed This Encounter  . Renal Function Panel  . CBC and Differential  . PTH, Intact  . Protein, Total, Random Urine w/Creatinine (Protein/Creat Ratio)        Impression/Recommendations  JOSHU FURUKAWA is a 83 y.o. male with past medical history of diabetes mellitus, extensive NSAID use, hypertension, BPH, anxiety, osteoarthritis, fistula placement 11/14/2021 who returns for followup of chronic kidney disease.  1. Chronic kidney disease stage IV/proteinuria.  The patient has advanced but stable chronic kidney disease with most recent EGFR 17 with urine protein creatinine ratio of 0.8.  We plan to maintain the patient on enalapril .  He does have left upper extremity fistula in place and I advised him to follow-up with vascular surgery to have this reevaluated to make sure that it is ready for use.  2.  Hypertension.  Blood pressure currently 160/68.  Maintain the patient on amlodipine , enalapril , hydralazine , metoprolol  for hypertension control.  3.  Anemia of chronic kidney disease.  Most recent hemoglobin was 11.1.  No indication for Epogen at the moment.  Repeat CBC prior to the next visit.  4.  Secondary hyperparathyroidism.  Most recent PTH was 119, phosphorus 3.8, calcium 9.2.  Hold off on calcitriol for now.  Maintain the patient on cholecalciferol  and follow-up bone mineral metabolism parameters prior to next visit.  5.  Hyperkalemia.  Serum potassium currently 4.7.  Maintain the  patient on Lokelma 10 g daily to maintain serum potassium within the normal range.  Follow-up serum potassium prior to next visit.   Return in about 8 weeks (around 07/14/2023).   Munsoor Lateef, MD

## 2023-05-20 ENCOUNTER — Telehealth (INDEPENDENT_AMBULATORY_CARE_PROVIDER_SITE_OTHER): Payer: Self-pay

## 2023-05-20 NOTE — Telephone Encounter (Signed)
 [facility] called stating that Dr. Rhesa Celeste didn't like how Jeffery Joseph's fistula felt yesterday and wanted him to follow up with vascular.   Please advise

## 2023-05-20 NOTE — Telephone Encounter (Signed)
 Should come in with an HDA can see me or GS

## 2023-05-21 NOTE — Telephone Encounter (Signed)
 Patients daughter has called again this morning concerned about an appointment. Can someone call and get [employer] scheduled for a HDA  and to see Wauneta Haddock or [employer]

## 2023-05-25 ENCOUNTER — Other Ambulatory Visit (INDEPENDENT_AMBULATORY_CARE_PROVIDER_SITE_OTHER): Payer: Self-pay | Admitting: Vascular Surgery

## 2023-05-25 DIAGNOSIS — Z9889 Other specified postprocedural states: Secondary | ICD-10-CM

## 2023-05-25 DIAGNOSIS — N186 End stage renal disease: Secondary | ICD-10-CM

## 2023-05-26 ENCOUNTER — Ambulatory Visit (INDEPENDENT_AMBULATORY_CARE_PROVIDER_SITE_OTHER)

## 2023-05-26 DIAGNOSIS — N186 End stage renal disease: Secondary | ICD-10-CM

## 2023-05-26 DIAGNOSIS — Z9889 Other specified postprocedural states: Secondary | ICD-10-CM | POA: Diagnosis not present

## 2023-05-28 ENCOUNTER — Encounter (INDEPENDENT_AMBULATORY_CARE_PROVIDER_SITE_OTHER): Payer: Self-pay | Admitting: Nurse Practitioner

## 2023-05-28 ENCOUNTER — Ambulatory Visit (INDEPENDENT_AMBULATORY_CARE_PROVIDER_SITE_OTHER): Admitting: Nurse Practitioner

## 2023-05-28 VITALS — BP 161/71 | HR 53 | Resp 18 | Ht 77.0 in | Wt 262.0 lb

## 2023-05-28 DIAGNOSIS — Z992 Dependence on renal dialysis: Secondary | ICD-10-CM

## 2023-05-28 DIAGNOSIS — I10 Essential (primary) hypertension: Secondary | ICD-10-CM | POA: Diagnosis not present

## 2023-05-28 DIAGNOSIS — E1122 Type 2 diabetes mellitus with diabetic chronic kidney disease: Secondary | ICD-10-CM | POA: Diagnosis not present

## 2023-05-28 DIAGNOSIS — N186 End stage renal disease: Secondary | ICD-10-CM

## 2023-05-30 DIAGNOSIS — E119 Type 2 diabetes mellitus without complications: Secondary | ICD-10-CM | POA: Insufficient documentation

## 2023-05-30 NOTE — H&P (View-Only) (Signed)
 Subjective:    Patient ID: Jeffery Joseph, male    DOB: [birth year], 83 y.o.   MRN: [id number] Chief Complaint  Patient presents with   Follow-up    Should come in with an HDA    The patient returns to the office for followup of their dialysis access.  Currently the patient has a fistula that has not yet been utilized.  He returns as he is nearing closer to needing dialysis and there has been a noted low thrill and bruit of his fistula.   The patient denies redness or swelling at the access site.   No recent shortening of the patient's walking distance or new symptoms consistent with claudication.  No history of rest pain symptoms. No new ulcers or wounds of the lower extremities have occurred.  The patient denies amaurosis fugax or recent TIA symptoms. There are no recent neurological changes noted. There is no history of DVT, PE or superficial thrombophlebitis. No recent episodes of angina or shortness of breath documented.   Duplex ultrasound of the AV access shows a patent access.  The previously noted stenosis is not significantly changed compared to last study.  Today there is a flow volume of 1357 but there are 2 areas of significant stenosis thought to be a likely retaining valve.    Review of Systems  All other systems reviewed and are negative.      Objective:    Physical Exam Vitals reviewed.  HENT:     Head: Normocephalic.  Cardiovascular:     Rate and Rhythm: Normal rate.     Pulses: Normal pulses.  Pulmonary:     Effort: Pulmonary effort is normal.  Skin:    General: Skin is warm and dry.  Neurological:     Mental Status: He is alert and oriented to person, place, and time.  Psychiatric:        Mood and Affect: Mood normal.        Behavior: Behavior normal.        Thought Content: Thought content normal.        Judgment: Judgment normal.     BP (!) 161/71   Pulse (!) 53   Resp 18   Ht 6\' 5"  (1.956 m)   Wt 262 lb (118.8 kg)   BMI 31.07 kg/m    Past Medical History:  Diagnosis Date   Anxiety    Arthritis    Depression    Diabetes mellitus without complication (HCC)    Hypertension    Pneumonia    Renal disorder     Social History   Socioeconomic History   Marital status: Married    Spouse name: ,Loraine   Number of children: Not on file   Years of education: Not on file   Highest education level: Not on file  Occupational History   Not on file  Tobacco Use   Smoking status: Never   Smokeless tobacco: Never  Substance and Sexual Activity   Alcohol use: No   Drug use: Not Currently   Sexual activity: Not Currently  Other Topics Concern   Not on file  Social History Narrative   Not on file   Social Drivers of Health   Financial Resource Strain: Low Risk  (11/25/2022)   Received from [facility]   Overall Financial Resource Strain (CARDIA)    Difficulty of Paying Living Expenses: Not hard at all  Food Insecurity: No Food Insecurity (11/25/2022)   [facility] from [facility]   Hunger Vital Sign    Worried About Running Out of Food in the Last Year: Never true    Ran Out of Food in the Last Year: Never true  Transportation Needs: No Transportation Needs (11/25/2022)   Received from [facility] - Transportation    In the past 12 months, has lack of transportation kept you from medical appointments or from getting medications?: No    Lack of Transportation (Non-Medical): No  Physical Activity: Not on file  Stress: Not on file  Social Connections: Not on file  Intimate Partner Violence: Not on file    Past Surgical History:  Procedure Laterality Date   APPLICATION OF WOUND VAC Right 11/14/2021   Procedure: APPLICATION OF WOUND VAC;  Surgeon: Jackquelyn Mass, MD;  Location: [facility] ORS;  Service: Vascular;  Laterality: Right;   AV FISTULA PLACEMENT Left 11/14/2021   Procedure: ARTERIOVENOUS (AV) FISTULA CREATION ( BRACHIAL CEPHALIC);  Surgeon:  Jackquelyn Mass, MD;  Location: [facility] ORS;  Service: Vascular;  Laterality: Left;    History reviewed. No pertinent family history.  No Known Allergies     Latest Ref Rng & Units 11/14/2021    7:18 AM 11/14/2021    7:02 AM 03/18/2015    3:41 AM  CBC  WBC 4.0 - 10.5 K/uL  4.8  4.6   Hemoglobin 13.0 - 17.0 g/dL 04.5  40.9  81.1   Hematocrit 39.0 - 52.0 % 34.0  35.0  35.2   Platelets 150 - 400 K/uL  151  148        CMP     Component Value Date/Time   NA 144 11/14/2021 0718   NA 139 05/06/2012 0843   K 5.0 11/14/2021 0718   K 4.1 05/06/2012 0843   CL 117 (H) 11/14/2021 0718   CL 106 05/06/2012 0843   CO2 22 03/18/2015 0341   CO2 26 05/06/2012 0843   GLUCOSE 109 (H) 11/14/2021 0718   GLUCOSE 112 (H) 05/06/2012 0843   BUN 46 (H) 11/14/2021 0718   BUN 33 (H) 05/06/2012 0843   CREATININE 3.60 (H) 11/14/2021 0718   CREATININE 2.09 (H) 05/06/2012 0843   CALCIUM 9.6 03/18/2015 0341   CALCIUM 9.5 05/06/2012 0843   PROT 7.5 03/18/2015 0341   ALBUMIN 4.2 03/18/2015 0341   AST 19 03/18/2015 0341   ALT 14 (L) 03/18/2015 0341   ALKPHOS 54 03/18/2015 0341   BILITOT 0.7 03/18/2015 0341   GFRNONAA 28 (L) 03/18/2015 0341   GFRNONAA 31 (L) 05/06/2012 0843     No results found.     Assessment & Plan:   1. ESRD (end stage renal disease) (HCC) (Primary) Recommend:  The patient is experiencing increasing problems with their dialysis access.  Patient should have a fistulagram with the intention for intervention.  The intention for intervention is to restore appropriate flow and prevent thrombosis and possible loss of the access.  As well as improve the quality of dialysis therapy.  The risks, benefits and alternative therapies were reviewed in detail with the patient.  All questions were answered.  The patient agrees to proceed with angio/intervention.    The patient will follow up with me in the office after the procedure.   2. Benign essential hypertension Continue  antihypertensive medications as already ordered, these medications have been reviewed and there are no changes at this time.  3. Type 2 diabetes mellitus with chronic kidney disease on chronic dialysis, without long-term  current use of insulin (HCC) Continue hypoglycemic medications as already ordered, these medications have been reviewed and there are no changes at this time.  Hgb A1C to be monitored as already arranged by primary service    Current Outpatient Medications on File Prior to Visit  Medication Sig Dispense Refill   acetaminophen  (TYLENOL ) 325 MG tablet Take 650 mg by mouth every 6 (six) hours as needed.     ALPRAZolam (XANAX) 0.5 MG tablet Take by mouth.     amLODipine (NORVASC) 10 MG tablet Take 10 mg by mouth daily.     Cholecalciferol 50 MCG (2000 UT) TABS Take by mouth.     Docusate Sodium  (DSS) 100 MG CAPS Take by mouth as needed.     donepezil (ARICEPT) 10 MG tablet Take by mouth.     enalapril (VASOTEC) 20 MG tablet Take 20 mg by mouth 2 (two) times daily.     finasteride  (PROSCAR ) 5 MG tablet Take 1 tablet (5 mg total) by mouth daily. 90 tablet 3   glipiZIDE (GLUCOTROL XL) 5 MG 24 hr tablet Take by mouth.     hydrALAZINE (APRESOLINE) 50 MG tablet Take 50 mg by mouth 3 (three) times daily.     metoprolol tartrate (LOPRESSOR) 25 MG tablet Take 1 tablet by mouth 2 (two) times daily.     Patiromer Sorbitex Calcium 16.8 g PACK Take by mouth.     tamsulosin  (FLOMAX ) 0.4 MG CAPS capsule Take 1 capsule (0.4 mg total) by mouth daily. 90 capsule 3   No current facility-administered medications on file prior to visit.    There are no Patient Instructions on file for this visit. No follow-ups on file.   Booker Bhatnagar E Lexi Conaty, NP

## 2023-05-30 NOTE — Progress Notes (Signed)
 Subjective:    Patient ID: Jeffery Joseph, male    DOB: [birth year], 83 y.o.   MRN: [id number] Chief Complaint  Patient presents with   Follow-up    Should come in with an HDA    The patient returns to the office for followup of their dialysis access.  Currently the patient has a fistula that has not yet been utilized.  He returns as he is nearing closer to needing dialysis and there has been a noted low thrill and bruit of his fistula.   The patient denies redness or swelling at the access site.   No recent shortening of the patient's walking distance or new symptoms consistent with claudication.  No history of rest pain symptoms. No new ulcers or wounds of the lower extremities have occurred.  The patient denies amaurosis fugax or recent TIA symptoms. There are no recent neurological changes noted. There is no history of DVT, PE or superficial thrombophlebitis. No recent episodes of angina or shortness of breath documented.   Duplex ultrasound of the AV access shows a patent access.  The previously noted stenosis is not significantly changed compared to last study.  Today there is a flow volume of 1357 but there are 2 areas of significant stenosis thought to be a likely retaining valve.    Review of Systems  All other systems reviewed and are negative.      Objective:    Physical Exam Vitals reviewed.  HENT:     Head: Normocephalic.  Cardiovascular:     Rate and Rhythm: Normal rate.     Pulses: Normal pulses.  Pulmonary:     Effort: Pulmonary effort is normal.  Skin:    General: Skin is warm and dry.  Neurological:     Mental Status: He is alert and oriented to person, place, and time.  Psychiatric:        Mood and Affect: Mood normal.        Behavior: Behavior normal.        Thought Content: Thought content normal.        Judgment: Judgment normal.     BP (!) 161/71   Pulse (!) 53   Resp 18   Ht 6\' 5"  (1.956 m)   Wt 262 lb (118.8 kg)   BMI 31.07 kg/m    Past Medical History:  Diagnosis Date   Anxiety    Arthritis    Depression    Diabetes mellitus without complication (HCC)    Hypertension    Pneumonia    Renal disorder     Social History   Socioeconomic History   Marital status: Married    Spouse name: ,Loraine   Number of children: Not on file   Years of education: Not on file   Highest education level: Not on file  Occupational History   Not on file  Tobacco Use   Smoking status: Never   Smokeless tobacco: Never  Substance and Sexual Activity   Alcohol use: No   Drug use: Not Currently   Sexual activity: Not Currently  Other Topics Concern   Not on file  Social History Narrative   Not on file   Social Drivers of Health   Financial Resource Strain: Low Risk  (11/25/2022)   Received from [facility]   Overall Financial Resource Strain (CARDIA)    Difficulty of Paying Living Expenses: Not hard at all  Food Insecurity: No Food Insecurity (11/25/2022)   Received from [facility]   Hunger Vital Sign    Worried About Running Out of Food in the Last Year: Never true    Ran Out of Food in the Last Year: Never true  Transportation Needs: No Transportation Needs (11/25/2022)   Received from [facility] - [facility]    In the past 12 months, has lack of transportation kept you from medical appointments or from getting medications?: No    Lack of Transportation (Non-Medical): No  Physical Activity: Not on file  Stress: Not on file  Social Connections: Not on file  Intimate Partner Violence: Not on file    Past Surgical History:  Procedure Laterality Date   APPLICATION OF WOUND VAC Right 11/14/2021   Procedure: APPLICATION OF WOUND VAC;  Surgeon: Jackquelyn Mass, MD;  Location: [facility] ORS;  Service: Vascular;  Laterality: Right;   AV FISTULA PLACEMENT Left 11/14/2021   Procedure: ARTERIOVENOUS (AV) FISTULA CREATION ( BRACHIAL CEPHALIC);  Surgeon:  Jackquelyn Mass, MD;  Location: [facility] ORS;  Service: Vascular;  Laterality: Left;    History reviewed. No pertinent family history.  No Known Allergies     Latest Ref Rng & Units 11/14/2021    7:18 AM 11/14/2021    7:02 AM 03/18/2015    3:41 AM  CBC  WBC 4.0 - 10.5 K/uL  4.8  4.6   Hemoglobin 13.0 - 17.0 g/dL 04.5  40.9  81.1   Hematocrit 39.0 - 52.0 % 34.0  35.0  35.2   Platelets 150 - 400 K/uL  151  148        CMP     Component Value Date/Time   NA 144 11/14/2021 0718   NA 139 05/06/2012 0843   K 5.0 11/14/2021 0718   K 4.1 05/06/2012 0843   CL 117 (H) 11/14/2021 0718   CL 106 05/06/2012 0843   CO2 22 03/18/2015 0341   CO2 26 05/06/2012 0843   GLUCOSE 109 (H) 11/14/2021 0718   GLUCOSE 112 (H) 05/06/2012 0843   BUN 46 (H) 11/14/2021 0718   BUN 33 (H) 05/06/2012 0843   CREATININE 3.60 (H) 11/14/2021 0718   CREATININE 2.09 (H) 05/06/2012 0843   CALCIUM 9.6 03/18/2015 0341   CALCIUM 9.5 05/06/2012 0843   PROT 7.5 03/18/2015 0341   ALBUMIN 4.2 03/18/2015 0341   AST 19 03/18/2015 0341   ALT 14 (L) 03/18/2015 0341   ALKPHOS 54 03/18/2015 0341   BILITOT 0.7 03/18/2015 0341   GFRNONAA 28 (L) 03/18/2015 0341   GFRNONAA 31 (L) 05/06/2012 0843     No results found.     Assessment & Plan:   1. ESRD (end stage renal disease) (HCC) (Primary) Recommend:  The patient is experiencing increasing problems with their dialysis access.  Patient should have a fistulagram with the intention for intervention.  The intention for intervention is to restore appropriate flow and prevent thrombosis and possible loss of the access.  As well as improve the quality of dialysis therapy.  The risks, benefits and alternative therapies were reviewed in detail with the patient.  All questions were answered.  The patient agrees to proceed with angio/intervention.    The patient will follow up with me in the office after the procedure.   2. Benign essential hypertension Continue  antihypertensive medications as already ordered, these medications have been reviewed and there are no changes at this time.  3. Type 2 diabetes mellitus with chronic kidney disease on chronic dialysis, without long-term  current use of insulin (HCC) Continue hypoglycemic medications as already ordered, these medications have been reviewed and there are no changes at this time.  Hgb A1C to be monitored as already arranged by primary service    Current Outpatient Medications on File Prior to Visit  Medication Sig Dispense Refill   acetaminophen  (TYLENOL ) 325 MG tablet Take 650 mg by mouth every 6 (six) hours as needed.     ALPRAZolam (XANAX) 0.5 MG tablet Take by mouth.     amLODipine (NORVASC) 10 MG tablet Take 10 mg by mouth daily.     Cholecalciferol 50 MCG (2000 UT) TABS Take by mouth.     Docusate Sodium  (DSS) 100 MG CAPS Take by mouth as needed.     donepezil (ARICEPT) 10 MG tablet Take by mouth.     enalapril (VASOTEC) 20 MG tablet Take 20 mg by mouth 2 (two) times daily.     finasteride  (PROSCAR ) 5 MG tablet Take 1 tablet (5 mg total) by mouth daily. 90 tablet 3   glipiZIDE (GLUCOTROL XL) 5 MG 24 hr tablet Take by mouth.     hydrALAZINE (APRESOLINE) 50 MG tablet Take 50 mg by mouth 3 (three) times daily.     metoprolol tartrate (LOPRESSOR) 25 MG tablet Take 1 tablet by mouth 2 (two) times daily.     Patiromer Sorbitex Calcium 16.8 g PACK Take by mouth.     tamsulosin  (FLOMAX ) 0.4 MG CAPS capsule Take 1 capsule (0.4 mg total) by mouth daily. 90 capsule 3   No current facility-administered medications on file prior to visit.    There are no Patient Instructions on file for this visit. No follow-ups on file.   Booker Bhatnagar E Lexi Conaty, NP

## 2023-05-31 ENCOUNTER — Telehealth (INDEPENDENT_AMBULATORY_CARE_PROVIDER_SITE_OTHER): Payer: Self-pay

## 2023-05-31 NOTE — Telephone Encounter (Signed)
I attempted to contact the patient's daughter to schedule a left arm fistulagram with Dr. Gilda Crease. A message was left for a return call.

## 2023-06-01 ENCOUNTER — Encounter (INDEPENDENT_AMBULATORY_CARE_PROVIDER_SITE_OTHER): Payer: Self-pay

## 2023-06-01 NOTE — Telephone Encounter (Signed)
 Spoke with the patient's daughter and he is scheduled with Dr. Prescilla Brod for a left arm fistulagram with a 2:00 pm arrival time to the [facility]. Pre-procedure instructions were discussed and will be sent to Mychart.

## 2023-06-08 ENCOUNTER — Ambulatory Visit
Admission: RE | Admit: 2023-06-08 | Discharge: 2023-06-08 | Disposition: A | Discharge status: Home / Self Care | Attending: Vascular Surgery | Admitting: Vascular Surgery

## 2023-06-08 ENCOUNTER — Encounter
Admission: RE | Disposition: A | Discharge status: Home / Self Care | Payer: Self-pay | Source: Home / Self Care | Attending: Vascular Surgery

## 2023-06-08 ENCOUNTER — Encounter: Payer: Self-pay | Admitting: Vascular Surgery

## 2023-06-08 ENCOUNTER — Other Ambulatory Visit: Payer: Self-pay

## 2023-06-08 DIAGNOSIS — I120 Hypertensive chronic kidney disease with stage 5 chronic kidney disease or end stage renal disease: Secondary | ICD-10-CM | POA: Diagnosis not present

## 2023-06-08 DIAGNOSIS — Y832 Surgical operation with anastomosis, bypass or graft as the cause of abnormal reaction of the patient, or of later complication, without mention of misadventure at the time of the procedure: Secondary | ICD-10-CM | POA: Diagnosis not present

## 2023-06-08 DIAGNOSIS — Z79899 Other long term (current) drug therapy: Secondary | ICD-10-CM | POA: Diagnosis not present

## 2023-06-08 DIAGNOSIS — N186 End stage renal disease: Secondary | ICD-10-CM | POA: Diagnosis present

## 2023-06-08 DIAGNOSIS — E1122 Type 2 diabetes mellitus with diabetic chronic kidney disease: Secondary | ICD-10-CM | POA: Insufficient documentation

## 2023-06-08 DIAGNOSIS — Z7984 Long term (current) use of oral hypoglycemic drugs: Secondary | ICD-10-CM | POA: Insufficient documentation

## 2023-06-08 DIAGNOSIS — Z992 Dependence on renal dialysis: Secondary | ICD-10-CM | POA: Diagnosis not present

## 2023-06-08 DIAGNOSIS — T82858A Stenosis of vascular prosthetic devices, implants and grafts, initial encounter: Secondary | ICD-10-CM | POA: Insufficient documentation

## 2023-06-08 HISTORY — PX: A/V FISTULAGRAM: CATH118298

## 2023-06-08 LAB — POTASSIUM (ARMC VASCULAR LAB ONLY): Potassium (ARMC vascular lab): 4.3 mmol/L (ref 3.5–5.1)

## 2023-06-08 LAB — GLUCOSE, CAPILLARY: Glucose-Capillary: 96 mg/dL (ref 70–99)

## 2023-06-08 MED ORDER — DIPHENHYDRAMINE HCL 50 MG/ML IJ SOLN: 50.0000 mg | Freq: Once | INTRAMUSCULAR | Status: DC | PRN

## 2023-06-08 MED ORDER — CEFAZOLIN SODIUM-DEXTROSE 1-4 GM/50ML-% IV SOLN
1.0000 g | INTRAVENOUS | Status: AC
  Administered 2023-06-08: 1 g via INTRAVENOUS

## 2023-06-08 MED ORDER — HEPARIN SODIUM (PORCINE) 1000 UNIT/ML IJ SOLN
INTRAMUSCULAR | Status: DC | PRN
  Administered 2023-06-08: 5000 [IU] via INTRAVENOUS

## 2023-06-08 MED ORDER — FAMOTIDINE 20 MG PO TABS: 40.0000 mg | ORAL_TABLET | Freq: Once | ORAL | Status: DC | PRN

## 2023-06-08 MED ORDER — ONDANSETRON HCL 4 MG/2ML IJ SOLN: 4.0000 mg | Freq: Four times a day (QID) | INTRAMUSCULAR | Status: DC | PRN

## 2023-06-08 MED ORDER — FENTANYL CITRATE (PF) 100 MCG/2ML IJ SOLN
INTRAMUSCULAR | Status: AC
  Filled 2023-06-08: qty 2

## 2023-06-08 MED ORDER — IODIXANOL 320 MG/ML IV SOLN
INTRAVENOUS | Status: DC | PRN
  Administered 2023-06-08: 30 mL

## 2023-06-08 MED ORDER — LIDOCAINE HCL (PF) 1 % IJ SOLN
INTRAMUSCULAR | Status: DC | PRN
  Administered 2023-06-08: 10 mL

## 2023-06-08 MED ORDER — HEPARIN SODIUM (PORCINE) 1000 UNIT/ML IJ SOLN
INTRAMUSCULAR | Status: AC
  Filled 2023-06-08: qty 10

## 2023-06-08 MED ORDER — METHYLPREDNISOLONE SODIUM SUCC 125 MG IJ SOLR: 125.0000 mg | Freq: Once | INTRAMUSCULAR | Status: DC | PRN

## 2023-06-08 MED ORDER — CEFAZOLIN SODIUM-DEXTROSE 1-4 GM/50ML-% IV SOLN
INTRAVENOUS | Status: AC
  Filled 2023-06-08: qty 50

## 2023-06-08 MED ORDER — MIDAZOLAM HCL 2 MG/ML PO SYRP: 8.0000 mg | ORAL_SOLUTION | Freq: Once | ORAL | Status: DC | PRN

## 2023-06-08 MED ORDER — CLOPIDOGREL BISULFATE 75 MG PO TABS: 75.0000 mg | ORAL_TABLET | Freq: Every day | ORAL | 11 refills | Status: DC

## 2023-06-08 MED ORDER — SODIUM CHLORIDE 0.9 % IV SOLN: INTRAVENOUS | Status: DC

## 2023-06-08 MED ORDER — HEPARIN (PORCINE) IN NACL 1000-0.9 UT/500ML-% IV SOLN
INTRAVENOUS | Status: DC | PRN
  Administered 2023-06-08: 500 mL

## 2023-06-08 MED ORDER — MIDAZOLAM HCL 2 MG/2ML IJ SOLN
INTRAMUSCULAR | Status: DC | PRN
  Administered 2023-06-08: .5 mg via INTRAVENOUS
  Administered 2023-06-08: 1 mg via INTRAVENOUS

## 2023-06-08 MED ORDER — HYDROMORPHONE HCL 1 MG/ML IJ SOLN: 1.0000 mg | Freq: Once | INTRAMUSCULAR | Status: DC | PRN

## 2023-06-08 MED ORDER — FENTANYL CITRATE (PF) 100 MCG/2ML IJ SOLN
INTRAMUSCULAR | Status: DC | PRN
  Administered 2023-06-08: 12.5 ug via INTRAVENOUS
  Administered 2023-06-08: 50 ug via INTRAVENOUS

## 2023-06-08 MED ORDER — CEFAZOLIN SODIUM-DEXTROSE 2-4 GM/100ML-% IV SOLN: 2.0000 g | INTRAVENOUS | Status: DC

## 2023-06-08 MED ORDER — MIDAZOLAM HCL 2 MG/2ML IJ SOLN
INTRAMUSCULAR | Status: AC
  Filled 2023-06-08: qty 4

## 2023-06-08 SURGICAL SUPPLY — 1 items: NDL ENTRY 21GA 7CM ECHOTIP (NEEDLE) IMPLANT

## 2023-06-08 NOTE — Interval H&P Note (Signed)
 History and Physical Interval Note:  06/08/2023 2:13 PM  Jeffery Joseph  has presented today for surgery, with the diagnosis of L Arm Fistulagram   End Stage Renal.  The various methods of treatment have been discussed with the patient and family. After consideration of risks, benefits and other options for treatment, the patient has consented to  Procedure(s): A/V Fistulagram (Left) as a surgical intervention.  The patient's history has been reviewed, patient examined, no change in status, stable for surgery.  I have reviewed the patient's chart and labs.  Questions were answered to the patient's satisfaction.     Devon Fogo

## 2023-06-08 NOTE — Op Note (Signed)
 OPERATIVE NOTE   PROCEDURE: Contrast injection left arm brachiocephalic AV access Percutaneous transluminal angioplasty and stent placement left brachiocephalic fistula  PRE-OPERATIVE DIAGNOSIS: Complication of dialysis access                                                       End Stage Renal Disease  POST-OPERATIVE DIAGNOSIS: same as above   SURGEON: Jeffery Joseph, M.D.  ANESTHESIA: Conscious sedation was administered under my direct supervision by the interventional radiology RN. IV Versed plus fentanyl  were utilized. Continuous ECG, pulse oximetry and blood pressure was monitored throughout the entire procedure.  Conscious sedation was for a total of 38 minutes.  ESTIMATED BLOOD LOSS: minimal  FINDING(S): Stricture of the AV graft  SPECIMEN(S):  None  CONTRAST: 30 cc  FLUOROSCOPY TIME: 6.1 minutes  INDICATIONS: Jeffery Joseph is a 83 y.o. male who  presents with malfunctioning left arm AV access.  The patient is scheduled for angiography with possible intervention of the AV access.  The patient is aware the risks include but are not limited to: bleeding, infection, thrombosis of the cannulated access, and possible anaphylactic reaction to the contrast.  The patient acknowledges if the access can not be salvaged a tunneled catheter will be needed and will be placed during this procedure.  The patient is aware of the risks of the procedure and elects to proceed with the angiogram and intervention.  DESCRIPTION: After full informed written consent was obtained, the patient was brought back to the Special Procedure suite and placed supine position.  Appropriate cardiopulmonary monitors were placed.  The left arm was prepped and draped in the standard fashion.  Appropriate timeout is called. The left brachiocephalic fistula was cannulated with a micropuncture needle.  Cannulation was performed with ultrasound guidance. Ultrasound was placed in a sterile sleeve, the AV access was  interrogated and noted to be echolucent and compressible indicating patency. Image was recorded for the permanent record. The puncture is performed under continuous ultrasound visualization.   The microwire was advanced and the needle was exchanged for  a microsheath.  The J-wire was then advanced and a 6 Fr sheath inserted.  Hand injections were completed to image the access from the arterial anastomosis through the entire access.  The central venous structures were also imaged by hand injections.  Based on the images,  5000 units of heparin  was given and a wire was negotiated through the strictures within the venous portion of the graft.  A 7 mm x 220 mm Lutonix drug-eluting balloon balloon was used to angioplasty the 4 contiguous lesions.  Inflation was to 12 atm for 1 minute.  Follow-up imaging demonstrated greater than 50% residual stenosis at all 4 locations.  The sheath was upsized to a 7 [place] sheath over a J-wire and a 8 mm x 250 Lu meter Viabahn stent was deployed from the cephalic subclavian confluence distally in the cephalic vein.  To cover the final lesion a 8 mm x 25 mm Viabahn stent was added.  Next an 8 mm x 100 mm Dorado balloon was advanced and used to angioplasty the entire length of the stents.  4 separate angioplasties were required with inflations from 16 atm to 20 atm for approximately 1 minute.  Follow-up imaging demonstrates complete resolution of the stricture with rapid flow of contrast  through the fistula, the central venous anatomy is preserved.  There is less than 10% residual stenosis  A 4-0 Monocryl purse-string suture was sewn around the sheath.  The sheath was removed and light pressure was applied.  A sterile bandage was applied to the puncture site.    COMPLICATIONS: None  CONDITION: Jeffery Joseph, M.D [facility] Office: [phone]  06/08/2023 3:35 PM

## 2023-06-08 NOTE — Discharge Instructions (Signed)
 Fistulagram/Shuntogram, Care After  Refer to this sheet in the next few weeks. These instructions provide you with information on caring for yourself after your procedure. Your health care provider may also give you more specific instructions. Your treatment has been planned according to current medical practices, but problems sometimes occur. Call your health care provider if you have any problems or questions after your procedure.  What can I expect after the procedure? After your procedure, it is typical to have the following: A small amount of discomfort in the area where the catheters were placed. A small amount of bruising around the fistula. Sleepiness and fatigue.  Follow these instructions at home: Rest at home for the day following your procedure. Do not drive or operate heavy machinery while taking pain medicine. Take medicines only as directed by your health care provider. Do not take baths, swim, or use a hot tub until your health care provider approves. You may shower 24 hours after the procedure or as directed by your health care provider. There are many different ways to close and cover an incision, including stitches, skin glue, and adhesive strips. Follow your health care provider's instructions on: Incision care. Bandage (dressing) changes and removal. Incision closure removal. Monitor your dialysis fistula carefully.  Contact a health care provider if: You have drainage, redness, swelling, or pain at your catheter site. You have a fever. You have chills.  Get help right away if: You feel weak. You have trouble balancing. You have trouble moving your arms or legs. You have problems with your speech or vision. You can no longer feel a vibration or buzz when you put your fingers over your dialysis fistula. The limb that was used for the procedure: Swells. Is painful. Is cold. Is discolored, such as blue or pale white.  This information is not intended to replace  advice given to you by your health care provider. Make sure you discuss any questions you have with your health care provider. Document Released: 05/15/2013 Document Revised: 06/06/2015 Document Reviewed: 02/17/2013 Elsevier Interactive Patient Education  2018 [employer].

## 2023-06-09 ENCOUNTER — Encounter: Payer: Self-pay | Admitting: Vascular Surgery

## 2023-07-09 ENCOUNTER — Other Ambulatory Visit (INDEPENDENT_AMBULATORY_CARE_PROVIDER_SITE_OTHER): Payer: Self-pay | Admitting: Vascular Surgery

## 2023-07-09 DIAGNOSIS — N186 End stage renal disease: Secondary | ICD-10-CM

## 2023-07-12 ENCOUNTER — Ambulatory Visit (INDEPENDENT_AMBULATORY_CARE_PROVIDER_SITE_OTHER): Admitting: Nurse Practitioner

## 2023-07-12 ENCOUNTER — Other Ambulatory Visit (INDEPENDENT_AMBULATORY_CARE_PROVIDER_SITE_OTHER)

## 2023-07-12 ENCOUNTER — Encounter (INDEPENDENT_AMBULATORY_CARE_PROVIDER_SITE_OTHER): Payer: Self-pay | Admitting: Nurse Practitioner

## 2023-07-12 VITALS — BP 154/71 | HR 56 | Ht 77.0 in | Wt 294.1 lb

## 2023-07-12 DIAGNOSIS — Z992 Dependence on renal dialysis: Secondary | ICD-10-CM

## 2023-07-12 DIAGNOSIS — N186 End stage renal disease: Secondary | ICD-10-CM

## 2023-07-12 DIAGNOSIS — I10 Essential (primary) hypertension: Secondary | ICD-10-CM | POA: Diagnosis not present

## 2023-07-12 DIAGNOSIS — E1122 Type 2 diabetes mellitus with diabetic chronic kidney disease: Secondary | ICD-10-CM

## 2023-07-12 NOTE — Progress Notes (Signed)
 Subjective:    Patient ID: Jeffery Joseph, male    DOB: [birth year], 83 y.o.   MRN: [id number] Chief Complaint  Patient presents with   Follow up with Schnier, Cordella MATSU, MD (Vascular Surgery) in    The patient returns to the office for followup status post intervention of their dialysis access left brachiocephalic AV fistula.   Following the intervention the access function has significantly improved, with better flow rates.  He is still not yet utilizing his fistula.  Previous study showed retained valves from his cephalic vein.  Today there is no significant stenosis noted.  No recent shortening of the patient's walking distance or new symptoms consistent with claudication.  No history of rest pain symptoms. No new ulcers or wounds of the lower extremities have occurred.  The patient denies amaurosis fugax or recent TIA symptoms. There are no recent neurological changes noted. There is no history of DVT, PE or superficial thrombophlebitis. No recent episodes of angina or shortness of breath documented.   Duplex ultrasound of the AV access shows a patent access.  The previously noted stenosis is improved compared to last study.  Flow volume today is 2520 cc/min (previous flow volume was 1357 cc/min)       Review of Systems  Musculoskeletal:  Positive for arthralgias.  All other systems reviewed and are negative.      Objective:   Physical Exam Vitals reviewed.  HENT:     Head: Normocephalic.   Cardiovascular:     Rate and Rhythm: Normal rate.     Pulses: Normal pulses.     Arteriovenous access: Left arteriovenous access is present.     Comments: Good thrill and bruit Pulmonary:     Effort: Pulmonary effort is normal.   Skin:    General: Skin is warm and dry.   Neurological:     Mental Status: He is alert and oriented to person, place, and time.   Psychiatric:        Mood and Affect: Mood normal.        Behavior: Behavior normal.        Thought Content: Thought  content normal.        Judgment: Judgment normal.     BP (!) 154/71   Pulse (!) 56   Ht 6' 5 (1.956 m)   Wt 294 lb 2 oz (133.4 kg)   BMI 34.88 kg/m   Past Medical History:  Diagnosis Date   Anxiety    Arthritis    Depression    Diabetes mellitus without complication (HCC)    Hypertension    Pneumonia    Renal disorder     Social History   Socioeconomic History   Marital status: Married    Spouse name: ,Loraine   Number of children: Not on file   Years of education: Not on file   Highest education level: Not on file  Occupational History   Not on file  Tobacco Use   Smoking status: Never   Smokeless tobacco: Never  Substance and Sexual Activity   Alcohol use: No   Drug use: Not Currently   Sexual activity: Not Currently  Other Topics Concern   Not on file  Social History Narrative   Not on file   Social Drivers of Health   Financial Resource Strain: Low Risk  (11/25/2022)   Received from [facility]   Overall Financial Resource Strain (CARDIA)    Difficulty of Paying Living Expenses: Not hard  at all  Food Insecurity: No Food Insecurity (11/25/2022)   Received from [facility]   Hunger Vital Sign    Within the past 12 months, you worried that your food would run out before you got the money to buy more.: Never true    Within the past 12 months, the food you bought just didn't last and you didn't have money to get more.: Never true  Transportation Needs: No Transportation Needs (11/25/2022)   Received from [facility] - Transportation    In the past 12 months, has lack of transportation kept you from medical appointments or from getting medications?: No    Lack of Transportation (Non-Medical): No  Physical Activity: Not on file  Stress: Not on file  Social Connections: Not on file  Intimate Partner Violence: Not on file    Past Surgical History:  Procedure Laterality Date   A/V  FISTULAGRAM Left 06/08/2023   Procedure: A/V Fistulagram;  Surgeon: Jama Cordella MATSU, MD;  Location: [facility] INVASIVE CV LAB;  Service: Cardiovascular;  Laterality: Left;   APPLICATION OF WOUND VAC Right 11/14/2021   Procedure: APPLICATION OF WOUND VAC;  Surgeon: Jama Cordella MATSU, MD;  Location: [facility] ORS;  Service: Vascular;  Laterality: Right;   AV FISTULA PLACEMENT Left 11/14/2021   Procedure: ARTERIOVENOUS (AV) FISTULA CREATION ( BRACHIAL CEPHALIC);  Surgeon: Jama Cordella MATSU, MD;  Location: [facility] ORS;  Service: Vascular;  Laterality: Left;    History reviewed. No pertinent family history.  No Known Allergies     Latest Ref Rng & Units 11/14/2021    7:18 AM 11/14/2021    7:02 AM 03/18/2015    3:41 AM  CBC  WBC 4.0 - 10.5 K/uL  4.8  4.6   Hemoglobin 13.0 - 17.0 g/dL 88.3  89.2  88.6   Hematocrit 39.0 - 52.0 % 34.0  35.0  35.2   Platelets 150 - 400 K/uL  151  148       CMP     Component Value Date/Time   NA 144 11/14/2021 0718   NA 139 05/06/2012 0843   K 5.0 11/14/2021 0718   K 4.1 05/06/2012 0843   CL 117 (H) 11/14/2021 0718   CL 106 05/06/2012 0843   CO2 22 03/18/2015 0341   CO2 26 05/06/2012 0843   GLUCOSE 109 (H) 11/14/2021 0718   GLUCOSE 112 (H) 05/06/2012 0843   BUN 46 (H) 11/14/2021 0718   BUN 33 (H) 05/06/2012 0843   CREATININE 3.60 (H) 11/14/2021 0718   CREATININE 2.09 (H) 05/06/2012 0843   CALCIUM 9.6 03/18/2015 0341   CALCIUM 9.5 05/06/2012 0843   PROT 7.5 03/18/2015 0341   ALBUMIN 4.2 03/18/2015 0341   AST 19 03/18/2015 0341   ALT 14 (L) 03/18/2015 0341   ALKPHOS 54 03/18/2015 0341   BILITOT 0.7 03/18/2015 0341   GFRNONAA 28 (L) 03/18/2015 0341   GFRNONAA 31 (L) 05/06/2012 0843     No results found.     Assessment & Plan:   1. ESRD (end stage renal disease) (HCC) (Primary) Recommend:  The patient is doing well and currently has adequate dialysis access. The patient's flow is improved from previous studies.  He has not yet on dialysis.  At this  point in time he does have adequate access for dialysis however patient can contact us  to follow-up sooner prior to beginning dialysis for evaluation of fistula.  The patient should have a duplex ultrasound of the dialysis access  in 6 months. The patient will follow-up with me in the office after each ultrasound    2. Type 2 diabetes mellitus with chronic kidney disease on chronic dialysis, without long-term current use of insulin (HCC) Continue hypoglycemic medications as already ordered, these medications have been reviewed and there are no changes at this time.  Hgb A1C to be monitored as already arranged by primary service  3. Benign essential hypertension Continue antihypertensive medications as already ordered, these medications have been reviewed and there are no changes at this time.   Current Outpatient Medications on File Prior to Visit  Medication Sig Dispense Refill   acetaminophen  (TYLENOL ) 325 MG tablet Take 650 mg by mouth every 6 (six) hours as needed.     ALPRAZolam (XANAX) 0.5 MG tablet Take by mouth.     amLODipine (NORVASC) 10 MG tablet Take 10 mg by mouth daily.     Cholecalciferol 50 MCG (2000 UT) TABS Take by mouth.     clopidogrel  (PLAVIX ) 75 MG tablet Take 1 tablet (75 mg total) by mouth daily. 30 tablet 11   Docusate Sodium  (DSS) 100 MG CAPS Take by mouth as needed.     donepezil (ARICEPT) 10 MG tablet Take by mouth.     enalapril (VASOTEC) 20 MG tablet Take 20 mg by mouth 2 (two) times daily.     finasteride  (PROSCAR ) 5 MG tablet Take 1 tablet (5 mg total) by mouth daily. 90 tablet 3   glipiZIDE (GLUCOTROL XL) 5 MG 24 hr tablet Take by mouth.     hydrALAZINE (APRESOLINE) 50 MG tablet Take 50 mg by mouth 3 (three) times daily.     metoprolol tartrate (LOPRESSOR) 25 MG tablet Take 1 tablet by mouth 2 (two) times daily.     Patiromer Sorbitex Calcium 16.8 g PACK Take by mouth.     tamsulosin  (FLOMAX ) 0.4 MG CAPS capsule Take 1 capsule (0.4 mg total) by mouth  daily. 90 capsule 3   No current facility-administered medications on file prior to visit.    There are no Patient Instructions on file for this visit. No follow-ups on file.   Mersadez Linden E Jamea Robicheaux, NP

## 2023-07-17 ENCOUNTER — Emergency Department

## 2023-07-17 ENCOUNTER — Inpatient Hospital Stay
Admission: EM | Admit: 2023-07-17 | Discharge: 2023-07-24 | DRG: 291 | Disposition: A | Discharge status: Home Health | Attending: Internal Medicine | Admitting: Internal Medicine

## 2023-07-17 DIAGNOSIS — M109 Gout, unspecified: Secondary | ICD-10-CM | POA: Diagnosis present

## 2023-07-17 DIAGNOSIS — E669 Obesity, unspecified: Secondary | ICD-10-CM | POA: Diagnosis present

## 2023-07-17 DIAGNOSIS — I161 Hypertensive emergency: Secondary | ICD-10-CM | POA: Diagnosis present

## 2023-07-17 DIAGNOSIS — Z6830 Body mass index (BMI) 30.0-30.9, adult: Secondary | ICD-10-CM

## 2023-07-17 DIAGNOSIS — Z7902 Long term (current) use of antithrombotics/antiplatelets: Secondary | ICD-10-CM

## 2023-07-17 DIAGNOSIS — I132 Hypertensive heart and chronic kidney disease with heart failure and with stage 5 chronic kidney disease, or end stage renal disease: Secondary | ICD-10-CM | POA: Diagnosis present

## 2023-07-17 DIAGNOSIS — J810 Acute pulmonary edema: Secondary | ICD-10-CM | POA: Diagnosis present

## 2023-07-17 DIAGNOSIS — N400 Enlarged prostate without lower urinary tract symptoms: Secondary | ICD-10-CM | POA: Diagnosis present

## 2023-07-17 DIAGNOSIS — Z7984 Long term (current) use of oral hypoglycemic drugs: Secondary | ICD-10-CM

## 2023-07-17 DIAGNOSIS — I493 Ventricular premature depolarization: Secondary | ICD-10-CM | POA: Diagnosis present

## 2023-07-17 DIAGNOSIS — I4891 Unspecified atrial fibrillation: Secondary | ICD-10-CM | POA: Diagnosis present

## 2023-07-17 DIAGNOSIS — E1151 Type 2 diabetes mellitus with diabetic peripheral angiopathy without gangrene: Secondary | ICD-10-CM | POA: Diagnosis present

## 2023-07-17 DIAGNOSIS — F0393 Unspecified dementia, unspecified severity, with mood disturbance: Secondary | ICD-10-CM | POA: Diagnosis present

## 2023-07-17 DIAGNOSIS — D631 Anemia in chronic kidney disease: Secondary | ICD-10-CM | POA: Diagnosis present

## 2023-07-17 DIAGNOSIS — E876 Hypokalemia: Secondary | ICD-10-CM | POA: Diagnosis present

## 2023-07-17 DIAGNOSIS — Z515 Encounter for palliative care: Secondary | ICD-10-CM | POA: Diagnosis not present

## 2023-07-17 DIAGNOSIS — Z79899 Other long term (current) drug therapy: Secondary | ICD-10-CM | POA: Diagnosis not present

## 2023-07-17 DIAGNOSIS — Z7982 Long term (current) use of aspirin: Secondary | ICD-10-CM | POA: Diagnosis not present

## 2023-07-17 DIAGNOSIS — Z1152 Encounter for screening for COVID-19: Secondary | ICD-10-CM

## 2023-07-17 DIAGNOSIS — F0394 Unspecified dementia, unspecified severity, with anxiety: Secondary | ICD-10-CM | POA: Diagnosis present

## 2023-07-17 DIAGNOSIS — F32A Depression, unspecified: Secondary | ICD-10-CM | POA: Diagnosis present

## 2023-07-17 DIAGNOSIS — Z66 Do not resuscitate: Secondary | ICD-10-CM | POA: Diagnosis present

## 2023-07-17 DIAGNOSIS — I5033 Acute on chronic diastolic (congestive) heart failure: Secondary | ICD-10-CM | POA: Diagnosis present

## 2023-07-17 DIAGNOSIS — I4892 Unspecified atrial flutter: Secondary | ICD-10-CM | POA: Diagnosis present

## 2023-07-17 DIAGNOSIS — R0602 Shortness of breath: Secondary | ICD-10-CM | POA: Diagnosis not present

## 2023-07-17 DIAGNOSIS — R7989 Other specified abnormal findings of blood chemistry: Secondary | ICD-10-CM

## 2023-07-17 DIAGNOSIS — Z7189 Other specified counseling: Secondary | ICD-10-CM | POA: Diagnosis not present

## 2023-07-17 DIAGNOSIS — E1122 Type 2 diabetes mellitus with diabetic chronic kidney disease: Secondary | ICD-10-CM | POA: Diagnosis present

## 2023-07-17 DIAGNOSIS — I2489 Other forms of acute ischemic heart disease: Secondary | ICD-10-CM | POA: Diagnosis present

## 2023-07-17 DIAGNOSIS — N186 End stage renal disease: Secondary | ICD-10-CM | POA: Diagnosis present

## 2023-07-17 DIAGNOSIS — I4510 Unspecified right bundle-branch block: Secondary | ICD-10-CM | POA: Diagnosis present

## 2023-07-17 DIAGNOSIS — Z7901 Long term (current) use of anticoagulants: Secondary | ICD-10-CM | POA: Diagnosis not present

## 2023-07-17 DIAGNOSIS — K5900 Constipation, unspecified: Secondary | ICD-10-CM | POA: Diagnosis present

## 2023-07-17 LAB — RESP PANEL BY RT-PCR (RSV, FLU A&B, COVID)  RVPGX2
Influenza A by PCR: NEGATIVE
Influenza B by PCR: NEGATIVE
Resp Syncytial Virus by PCR: NEGATIVE
SARS Coronavirus 2 by RT PCR: NEGATIVE

## 2023-07-17 LAB — COMPREHENSIVE METABOLIC PANEL WITH GFR
ALT: 35 U/L (ref 0–44)
AST: 23 U/L (ref 15–41)
Albumin: 3.2 g/dL — ABNORMAL LOW (ref 3.5–5.0)
Alkaline Phosphatase: 54 U/L (ref 38–126)
Anion gap: 10 (ref 5–15)
BUN: 40 mg/dL — ABNORMAL HIGH (ref 8–23)
CO2: 23 mmol/L (ref 22–32)
Calcium: 8.8 mg/dL — ABNORMAL LOW (ref 8.9–10.3)
Chloride: 110 mmol/L (ref 98–111)
Creatinine, Ser: 3.2 mg/dL — ABNORMAL HIGH (ref 0.61–1.24)
GFR, Estimated: 18 mL/min — ABNORMAL LOW (ref >60)
Glucose, Bld: 122 mg/dL — ABNORMAL HIGH (ref 70–99)
Potassium: 3.3 mmol/L — ABNORMAL LOW (ref 3.5–5.1)
Sodium: 143 mmol/L (ref 135–145)
Total Bilirubin: 1 mg/dL (ref 0.0–1.2)
Total Protein: 6.6 g/dL (ref 6.5–8.1)

## 2023-07-17 LAB — URINALYSIS, COMPLETE (UACMP) WITH MICROSCOPIC
Bacteria, UA: NONE SEEN
Bilirubin Urine: NEGATIVE
Glucose, UA: NEGATIVE mg/dL
Ketones, ur: NEGATIVE mg/dL
Leukocytes,Ua: NEGATIVE
Nitrite: NEGATIVE
Protein, ur: 100 mg/dL — AB
Specific Gravity, Urine: 1.012 (ref 1.005–1.030)
Squamous Epithelial / HPF: 0 /HPF (ref 0–5)
pH: 6 (ref 5.0–8.0)

## 2023-07-17 LAB — CBC
HCT: 33.6 % — ABNORMAL LOW (ref 39.0–52.0)
Hemoglobin: 10.1 g/dL — ABNORMAL LOW (ref 13.0–17.0)
MCH: 25.6 pg — ABNORMAL LOW (ref 26.0–34.0)
MCHC: 30.1 g/dL (ref 30.0–36.0)
MCV: 85.1 fL (ref 80.0–100.0)
Platelets: 187 K/uL (ref 150–400)
RBC: 3.95 MIL/uL — ABNORMAL LOW (ref 4.22–5.81)
RDW: 16.7 % — ABNORMAL HIGH (ref 11.5–15.5)
WBC: 7.5 K/uL (ref 4.0–10.5)
nRBC: 0 % (ref 0.0–0.2)

## 2023-07-17 LAB — PROTIME-INR
INR: 1.3 — ABNORMAL HIGH (ref 0.8–1.2)
Prothrombin Time: 17 s — ABNORMAL HIGH (ref 11.4–15.2)

## 2023-07-17 LAB — TROPONIN I (HIGH SENSITIVITY): Troponin I (High Sensitivity): 154 ng/L (ref <18)

## 2023-07-17 LAB — LACTIC ACID, PLASMA: Lactic Acid, Venous: 1.2 mmol/L (ref 0.5–1.9)

## 2023-07-17 LAB — BRAIN NATRIURETIC PEPTIDE: B Natriuretic Peptide: 2410.3 pg/mL — ABNORMAL HIGH (ref 0.0–100.0)

## 2023-07-17 MED ORDER — SODIUM CHLORIDE 0.9 % IV SOLN
1.0000 g | Freq: Once | INTRAVENOUS | Status: AC
  Administered 2023-07-17: 1 g via INTRAVENOUS
  Filled 2023-07-17: qty 10

## 2023-07-17 MED ORDER — ENALAPRIL MALEATE 10 MG PO TABS
20.0000 mg | ORAL_TABLET | Freq: Two times a day (BID) | ORAL | Status: DC
  Administered 2023-07-18 – 2023-07-24 (×13): 20 mg via ORAL
  Filled 2023-07-17 (×10): qty 2
  Filled 2023-07-17: qty 1
  Filled 2023-07-17 (×4): qty 2

## 2023-07-17 MED ORDER — FINASTERIDE 5 MG PO TABS
5.0000 mg | ORAL_TABLET | Freq: Every day | ORAL | Status: DC
  Administered 2023-07-18 – 2023-07-24 (×7): 5 mg via ORAL
  Filled 2023-07-17 (×7): qty 1

## 2023-07-17 MED ORDER — GLIPIZIDE ER 5 MG PO TB24
5.0000 mg | ORAL_TABLET | Freq: Every day | ORAL | Status: DC
  Administered 2023-07-18 – 2023-07-24 (×7): 5 mg via ORAL
  Filled 2023-07-17 (×7): qty 1

## 2023-07-17 MED ORDER — FUROSEMIDE 10 MG/ML IJ SOLN
40.0000 mg | Freq: Once | INTRAMUSCULAR | Status: AC
  Administered 2023-07-17: 40 mg via INTRAVENOUS
  Filled 2023-07-17: qty 4

## 2023-07-17 MED ORDER — HEPARIN BOLUS VIA INFUSION
4000.0000 [IU] | Freq: Once | INTRAVENOUS | Status: AC
  Administered 2023-07-17: 4000 [IU] via INTRAVENOUS
  Filled 2023-07-17: qty 4000

## 2023-07-17 MED ORDER — METOPROLOL TARTRATE 25 MG PO TABS
25.0000 mg | ORAL_TABLET | ORAL | Status: DC
  Administered 2023-07-18 (×2): 25 mg via ORAL
  Filled 2023-07-17 (×2): qty 1

## 2023-07-17 MED ORDER — DOCUSATE SODIUM 100 MG PO CAPS
100.0000 mg | ORAL_CAPSULE | Freq: Every day | ORAL | Status: DC
  Administered 2023-07-18 (×2): 100 mg via ORAL
  Filled 2023-07-17 (×2): qty 1

## 2023-07-17 MED ORDER — HEPARIN (PORCINE) 25000 UT/250ML-% IV SOLN
2350.0000 [IU]/h | INTRAVENOUS | Status: DC
  Administered 2023-07-17: 1450 [IU]/h via INTRAVENOUS
  Administered 2023-07-18 – 2023-07-20 (×3): 2050 [IU]/h via INTRAVENOUS
  Filled 2023-07-17 (×6): qty 250

## 2023-07-17 MED ORDER — ACETAMINOPHEN 650 MG RE SUPP: 650.0000 mg | Freq: Four times a day (QID) | RECTAL | Status: DC | PRN

## 2023-07-17 MED ORDER — SODIUM CHLORIDE 0.9 % IV SOLN
500.0000 mg | Freq: Once | INTRAVENOUS | Status: AC
  Administered 2023-07-17: 500 mg via INTRAVENOUS
  Filled 2023-07-17: qty 5

## 2023-07-17 MED ORDER — ALPRAZOLAM 0.5 MG PO TABS
0.5000 mg | ORAL_TABLET | Freq: Three times a day (TID) | ORAL | Status: DC | PRN
  Administered 2023-07-18 (×2): 0.5 mg via ORAL
  Filled 2023-07-17 (×2): qty 1

## 2023-07-17 MED ORDER — ASPIRIN 81 MG PO TBEC
81.0000 mg | DELAYED_RELEASE_TABLET | Freq: Every day | ORAL | Status: DC
  Administered 2023-07-18 – 2023-07-20 (×3): 81 mg via ORAL
  Filled 2023-07-17 (×3): qty 1

## 2023-07-17 MED ORDER — ONDANSETRON HCL 4 MG PO TABS: 4.0000 mg | ORAL_TABLET | Freq: Four times a day (QID) | ORAL | Status: DC | PRN

## 2023-07-17 MED ORDER — SODIUM BICARBONATE 650 MG PO TABS
650.0000 mg | ORAL_TABLET | Freq: Two times a day (BID) | ORAL | Status: DC
  Administered 2023-07-18 – 2023-07-24 (×13): 650 mg via ORAL
  Filled 2023-07-17 (×13): qty 1

## 2023-07-17 MED ORDER — HYDRALAZINE HCL 50 MG PO TABS
100.0000 mg | ORAL_TABLET | Freq: Three times a day (TID) | ORAL | Status: DC
  Administered 2023-07-18 – 2023-07-20 (×7): 100 mg via ORAL
  Filled 2023-07-17 (×8): qty 2

## 2023-07-17 MED ORDER — ACETAMINOPHEN 325 MG PO TABS
650.0000 mg | ORAL_TABLET | Freq: Four times a day (QID) | ORAL | Status: DC | PRN
  Administered 2023-07-19: 650 mg via ORAL
  Filled 2023-07-17: qty 2

## 2023-07-17 MED ORDER — SENNOSIDES-DOCUSATE SODIUM 8.6-50 MG PO TABS: 1.0000 | ORAL_TABLET | Freq: Every evening | ORAL | Status: DC | PRN

## 2023-07-17 MED ORDER — ONDANSETRON HCL 4 MG/2ML IJ SOLN: 4.0000 mg | Freq: Four times a day (QID) | INTRAMUSCULAR | Status: DC | PRN

## 2023-07-17 MED ORDER — VITAMIN D 25 MCG (1000 UNIT) PO TABS
5000.0000 [IU] | ORAL_TABLET | Freq: Every day | ORAL | Status: DC
  Administered 2023-07-18 – 2023-07-24 (×7): 5000 [IU] via ORAL
  Filled 2023-07-17 (×7): qty 5

## 2023-07-17 MED ORDER — DONEPEZIL HCL 5 MG PO TABS
10.0000 mg | ORAL_TABLET | Freq: Every day | ORAL | Status: DC
  Administered 2023-07-18 – 2023-07-23 (×7): 10 mg via ORAL
  Filled 2023-07-17 (×7): qty 2

## 2023-07-17 MED ORDER — TAMSULOSIN HCL 0.4 MG PO CAPS
0.4000 mg | ORAL_CAPSULE | Freq: Every day | ORAL | Status: DC
  Administered 2023-07-18 – 2023-07-24 (×7): 0.4 mg via ORAL
  Filled 2023-07-17 (×7): qty 1

## 2023-07-17 MED ORDER — AMLODIPINE BESYLATE 10 MG PO TABS
10.0000 mg | ORAL_TABLET | Freq: Every day | ORAL | Status: DC
  Administered 2023-07-18 – 2023-07-20 (×2): 10 mg via ORAL
  Filled 2023-07-17: qty 1
  Filled 2023-07-17: qty 2

## 2023-07-17 MED ORDER — CLOPIDOGREL BISULFATE 75 MG PO TABS
75.0000 mg | ORAL_TABLET | Freq: Every day | ORAL | Status: DC
  Administered 2023-07-18 – 2023-07-24 (×7): 75 mg via ORAL
  Filled 2023-07-17 (×7): qty 1

## 2023-07-17 NOTE — Progress Notes (Signed)
 PHARMACY - ANTICOAGULATION CONSULT NOTE  Pharmacy Consult for heparin  Indication: chest pain/ACS  No Known Allergies  Patient Measurements: Height: 6' 5 (195.6 cm) Weight: 118.4 kg (261 lb) IBW/kg (Calculated) : 89.1 HEPARIN  DW (KG): 113.5  Vital Signs: Temp: 100.3 F (37.9 C) (07/05 2027) Temp Source: Oral (07/05 2027) BP: 176/71 (07/05 2100) Pulse Rate: 77 (07/05 2100)  Labs: Recent Labs    07/17/23 2042  HGB 10.1*  HCT 33.6*  PLT 187  CREATININE 3.20*  TROPONINIHS 154*    Estimated Creatinine Clearance: 24.9 mL/min (A) (by C-G formula based on SCr of 3.2 mg/dL (H)).   Medical History: Past Medical History:  Diagnosis Date   Anxiety    Arthritis    Depression    Diabetes mellitus without complication (HCC)    Hypertension    Pneumonia    Renal disorder    Assessment: 83 y/o male presenting with shortness of breath - found to have elevated troponin levels. PMH significant for anxiety, depression, diabetes, hypertension. Pharmacy has been consulted to initiate heparin  infusion. Per chart review, patient was not on anticoagulation prior to admission.  Baseline labs: hgb 10.1, plt 187, INR pending  Goal of Therapy:  Heparin  level 0.3-0.7 units/ml Monitor platelets by anticoagulation protocol: Yes   Plan:  Give 4000 units bolus x 1 Start heparin  infusion at 1450 units/hr Check anti-Xa level in 8 hours and daily while on heparin  Continue to monitor H&H and platelets  Thank you for involving pharmacy in this patient's care.   Damien Napoleon, PharmD Clinical Pharmacist 07/17/2023 9:55 PM

## 2023-07-17 NOTE — ED Notes (Addendum)
 While caring for the patient, Dava Kue, pts daughter called out stating the patient needs to be placed on oxygen because his oxygen saturations were dropping to 89%. Patient assessed and O2 93-94% on room air. Family instructed that the patient does not require oxygen at this time. She stated well he is sniffling and coughing and I stated that he does not require oxygen. Dava became very passive aggressive with her comments, smiling, waving and constantly critiquing this nurse with all care being provided how she doesn't want to leave him alone with this nurse when her mother who was in the patients room needed to use the restroom while providing care and informing this nurse that she has been a nurse for 40 years, works in colonoscopy, and that she has never seen a patient who is coughing sniffling that didn't needs oxygen. I again reiterated to Dava that I was not placing the patient on oxygen at this time because it is not warranted that the values were most likely from repeat BP cycling. She then brings out paper, asking my name and every time this nurse made a move in the patients room, she passively brought out her paper and wrote something down hindering patient care, distracting this nurse from my role, and creating a hostile environment to perform care. She also went as far as to tell me that the patient has dementia and none of the charts cross over referring to epic. I stated I would review his chart from previous encounters. She stated [facility] does not cross over and that I am not listening to her and started laughing out loud. She began making comments how swollen the patient is and that's why his CO2 is so high and I informed her that his BMP has not resulted and she wouldn't have that value at that time. His BMP later resulted and I informed her his CO2 was normal. This behavior continued and this nurse informed Bernarda, charge RN of Davas behavior and creating a difficult environment  to effectively and safely care for the patient free from distractions and asked if she can be asked to leave. Bernarda, RN agreed and before speaking to Dava she stated that the patient can go to c-pod r/t pending admission.

## 2023-07-17 NOTE — ED Provider Notes (Signed)
 [facility] Provider Note    Event Date/Time   First MD Initiated Contact with Patient 07/17/23 2016     (approximate)  History   Chief Complaint: Shortness of Breath  HPI  Jeffery Joseph is a 83 y.o. male with a past medical history of anxiety, depression, diabetes, hypertension, presents to the emergency department for shortness of breath.  According to the patient he has chronic kidney disease, has a left AV fistula but has not yet started dialysis.  Patient states over the last 2 to 3 days he has progressively been more short of breath.  Patient found to have a borderline low-grade fever 100.3 in the emergency department.  Mild tachypnea.  Patient denies any cough more than baseline however does state increased lower extremity edema.  Physical Exam   Triage Vital Signs: ED Triage Vitals  Encounter Vitals Group     BP 07/17/23 2027 (!) 172/79     Girls Systolic BP Percentile --      Girls Diastolic BP Percentile --      Boys Systolic BP Percentile --      Boys Diastolic BP Percentile --      Pulse Rate 07/17/23 2027 79     Resp 07/17/23 2027 (!) 28     Temp 07/17/23 2027 100.3 F (37.9 C)     Temp Source 07/17/23 2027 Oral     SpO2 07/17/23 2027 93 %     Weight 07/17/23 2028 261 lb (118.4 kg)     Height 07/17/23 2028 6' 5 (1.956 m)     Head Circumference --      Peak Flow --      Pain Score 07/17/23 2028 0     Pain Loc --      Pain Education --      Exclude from Growth Chart --     Most recent vital signs: Vitals:   07/17/23 2027  BP: (!) 172/79  Pulse: 79  Resp: (!) 28  Temp: 100.3 F (37.9 C)  SpO2: 93%    General: Awake, no distress.  CV:  Good peripheral perfusion.  Regular rate and rhythm  Resp:  Mild increased work of breathing with mild to moderate tachypnea around 30 breaths/min.  No obvious wheeze rales or rhonchi. Abd:  No distention.  Soft, nontender.  No rebound or guarding. Other:  2-3+ lower extremity edema  bilaterally.   ED Results / Procedures / Treatments   EKG  EKG viewed and interpreted by myself shows a normal sinus rhythm at 93 bpm with a widened QRS, left axis deviation, largely normal intervals with nonspecific ST changes.  No ST elevation.  RADIOLOGY  I have reviewed interpret the chest x-ray images appears most consistent with pulmonary edema my evaluation. Radiology confirms pulmonary edema.   MEDICATIONS ORDERED IN ED: Medications - No data to display   IMPRESSION / MDM / ASSESSMENT AND PLAN / ED COURSE  I reviewed the triage vital signs and the nursing notes.  Patient's presentation is most consistent with acute presentation with potential threat to life or bodily function.  Patient presents to the emergency department for worsening shortness of breath over the last couple days.  Patient has tachypnea 3+ lower extremity edema bilaterally currently satting around 93% on room air.  Patient also found have a borderline low-grade temperature 100.3 possibly indicating an infectious etiology.  Differential would include pneumonia, viral infection, pulmonary edema/congestion.  Will check labs including a CBC chemistry troponin and  a BNP.  Will obtain a chest x-ray.  Given the low-grade borderline temperature we will obtain a respiratory swab as well as a urinalysis.  Patient's workup today shows a reassuring CBC, overall reassuring chemistry chronic kidney disease not significantly changed from baseline.  Potassium of 3.3.  Patient's troponin is elevated to 154 and BNP elevated to 2400.  Will start the patient on a heparin  infusion while awaiting further results.  Chest x-ray appears consistent with pulmonary edema.  I have dosed 40 mg of IV Lasix .  Patient continues to sat in the low 90s on room air.  I spoke to nephrology they recommend admission to the hospital service for continued Lasix , if he does not improve in 24 hours patient may need to be started on dialysis per nephrology.   Given the patient's low-grade temperature and tachypnea we are covering with antibiotics as a precaution.  CRITICAL CARE Performed by: Franky Moores   Total critical care time: 30 minutes  Critical care time was exclusive of separately billable procedures and treating other patients.  Critical care was necessary to treat or prevent imminent or life-threatening deterioration.  Critical care was time spent personally by me on the following activities: development of treatment plan with patient and/or surrogate as well as nursing, discussions with consultants, evaluation of patient's response to treatment, examination of patient, obtaining history from patient or surrogate, ordering and performing treatments and interventions, ordering and review of laboratory studies, ordering and review of radiographic studies, pulse oximetry and re-evaluation of patient's condition.   FINAL CLINICAL IMPRESSION(S) / ED DIAGNOSES   SOB Pulmonary edema Elevated troponin   Note:  This document was prepared using Dragon voice recognition software and may include unintentional dictation errors.   Moores Franky, MD 07/17/23 2149

## 2023-07-17 NOTE — ED Triage Notes (Signed)
 Patient BIB EMS for c/c of SOB and HTN. EMS reports htn in the 200's systolic. Patient states he hasn't been feeling well lately and told family to call 911.

## 2023-07-17 NOTE — H&P (Incomplete)
 History and Physical  Jeffery Joseph FMW:[id number] DOB: Jul 25, 1940 DOA: 07/17/2023  PCP: Epifanio Alm SQUIBB, MD   Chief Complaint: Shortness of breath  HPI: Jeffery Joseph is a 83 y.o. 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  ED Course: Initial vitals show temp 100.3, RR 22, HR 77, BP 176/71, SpO2 94% on room air. Initial labs significant for sodium 143, K+ 3.3, glucose 122, BUN/creatinine 40/3.20, BNP 2410, troponin 154, lactic acid 1.2, WBC 7.5, Hgb 10.1, platelet 187, negative COVID, flu and RSV test UA with no signs of infection. EKG shows sinus rhythm with RBBB, LVH and multiple PVCs. CXR shows cardiomegaly and pulmonary edema. Pt received IV Lasix  40 mg x 1, IV Rocephin , IV azithromycin  and started on IV heparin  drip.  Nephrology was consulted for evaluation. TRH was consulted for admission.   Review of Systems: Please see HPI for pertinent positives and negatives. A complete 10 system review of systems are otherwise negative.  Past Medical History:  Diagnosis Date   Anxiety    Arthritis    Depression    Diabetes mellitus without complication (HCC)    Hypertension    Pneumonia    Renal disorder    Past Surgical History:  Procedure Laterality Date   A/V FISTULAGRAM Left 06/08/2023   Procedure: A/V Fistulagram;  Surgeon: Jama Cordella MATSU, MD;  Location: [facility];  Service: Cardiovascular;  Laterality: Left;   APPLICATION OF WOUND VAC Right 11/14/2021   Procedure: APPLICATION OF WOUND VAC;  Surgeon: Jama Cordella MATSU, MD;  Location: [facility];  Service: Vascular;  Laterality: Right;   AV FISTULA PLACEMENT Left 11/14/2021   Procedure: ARTERIOVENOUS (AV) FISTULA CREATION ( BRACHIAL CEPHALIC);  Surgeon: Jama Cordella MATSU, MD;  Location: [facility];  Service: Vascular;  Laterality: Left;    Social History:  reports that he has never smoked. He has never used smokeless tobacco. He reports that he does not currently use drugs. He reports that he does not drink alcohol.  No Known Allergies  No family history on file.   Prior to Admission medications   Medication Sig Start Date End Date Taking? Authorizing Provider  acetaminophen  (TYLENOL ) 325 MG tablet Take 650 mg by mouth every 6 (six) hours as needed.   Yes [provider]  ALPRAZolam  (XANAX ) 0.5 MG tablet Take 0.5 mg by mouth 3 (three) times daily as needed for sleep. 02/05/21  Yes [provider]  amLODipine  (NORVASC ) 10 MG tablet Take 10 mg by mouth daily.   Yes [provider]  aspirin  EC 81 MG tablet Take 81 mg by mouth daily. Swallow whole.   Yes [provider]  Cholecalciferol  125 MCG (5000 UT) TABS Take 5,000 Units by mouth daily.   Yes [provider]  clopidogrel  (PLAVIX ) 75 MG tablet Take 1 tablet (75 mg total) by mouth daily. 06/09/23  Yes Schnier, Cordella MATSU, MD  Docusate Sodium  (DSS) 100 MG CAPS Take 100 mg by mouth at bedtime.   Yes [provider]  donepezil  (ARICEPT ) 10 MG tablet Take 10 mg by mouth at bedtime. 05/14/21  Yes [provider]  enalapril  (VASOTEC ) 20 MG tablet Take 20 mg by mouth 2 (two) times daily. 08/12/21  Yes [provider]  finasteride  (PROSCAR ) 5 MG tablet Take 1 tablet (5 mg total) by mouth  daily. 08/12/19  Yes Stoioff, Glendia BROCKS, MD  glipiZIDE  (GLUCOTROL  XL) 5 MG 24 hr tablet Take by mouth. 12/01/11  Yes [provider]  hydrALAZINE  (APRESOLINE ) 50 MG tablet Take 100 mg by mouth 3 (three) times daily. 06/03/23  Yes [provider]  LOKELMA 10 g PACK packet Take 1 packet by mouth daily. 07/12/23  Yes [provider]  metoprolol  tartrate (LOPRESSOR ) 25 MG tablet Take 25 mg by mouth 2 (two) times daily in the am and at bedtime.. 08/15/18  Yes [provider]  sodium bicarbonate  650 MG tablet Take  650 mg by mouth 2 (two) times daily. 06/16/23  Yes [provider]  tamsulosin  (FLOMAX ) 0.4 MG CAPS capsule Take 1 capsule (0.4 mg total) by mouth daily. 08/12/19  Yes Stoioff, Glendia BROCKS, MD  Patiromer Sorbitex Calcium 16.8 g PACK Take by mouth. Patient not taking: Reported on 07/17/2023    [provider]    Physical Exam: BP (!) 189/65   Pulse 76   Temp 100.3 F (37.9 C) (Oral)   Resp 20   Ht 6' 5 (1.956 m)   Wt 118.4 kg   SpO2 91%   BMI 30.95 kg/m  General: Pleasant, well-appearing *** laying in bed. No acute distress. HEENT: [place]/AT. Anicteric sclera CV: RRR. No murmurs, rubs, or gallops. No LE edema Pulmonary: Lungs CTAB. Normal effort. No wheezing or rales. Abdominal: Soft, nontender, nondistended. Normal bowel sounds. Extremities: Palpable radial and DP pulses. Normal ROM. Skin: Warm and dry. No obvious rash or lesions. Neuro: A&Ox3. Moves all extremities. Normal sensation to light touch. No focal deficit. Psych: Normal mood and affect          Labs on Admission:  Basic Metabolic Panel: Recent Labs  Lab 07/17/23 2042  NA 143  K 3.3*  CL 110  CO2 23  GLUCOSE 122*  BUN 40*  CREATININE 3.20*  CALCIUM 8.8*   Liver Function Tests: Recent Labs  Lab 07/17/23 2042  AST 23  ALT 35  ALKPHOS 54  BILITOT 1.0  PROT 6.6  ALBUMIN 3.2*   No results for input(s): LIPASE, AMYLASE in the last 168 hours. No results for input(s): AMMONIA in the last 168 hours. CBC: Recent Labs  Lab 07/17/23 2042  WBC 7.5  HGB 10.1*  HCT 33.6*  MCV 85.1  PLT 187   Cardiac Enzymes: No results for input(s): CKTOTAL, CKMB, CKMBINDEX, TROPONINI in the last 168 hours. BNP (last 3 results) Recent Labs    07/17/23 2042  BNP 2,410.3*    ProBNP (last 3 results) No results for input(s): PROBNP in the last 8760 hours.  CBG: No results for input(s): GLUCAP in the last 168 hours.  Radiological Exams on Admission: DG Chest Portable 1 View Result Date:  07/17/2023 CLINICAL DATA:  sob EXAM: PORTABLE CHEST 1 VIEW COMPARISON:  None Available. FINDINGS: Cardiac enlargement with some portion likely due to AP portable technique. Otherwise the heart and mediastinal contours are within normal limits. No focal consolidation. Pulmonary edema. No pleural effusion. No pneumothorax. No acute osseous abnormality. IMPRESSION: 1. Pulmonary edema. 2. Cardiac enlargement with some portion likely due to AP portable technique. 3. Consider repeat chest x-ray PA and lateral view for further evaluation. Electronically Signed   By: Morgane  Naveau M.D.   On: 07/17/2023 21:07   Assessment/Plan Jeffery Joseph is a 83 y.o. 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, T2DM, HTN, PAD, arthritis, anxiety and depression, anemia of chronic disease, chronic hyperkalemia  and BPH who presented to the ED for evaluation of shortness of breath and admitted for acute pulmonary edema  # Acute pulmonary edema  # Hypertensive urgency  -Continue amlodipine , Lopressor , hydralazine  and enalapril   # ESRD  - # T2DM  - Continue glipizide  # Anemia of chronic disease  # PAD - Continue aspirin  and Plavix   # BPH - Continue finasteride  and tamsulosin   # Anxiety and depression - Continue as needed Xanax   # Dementia - Continue Aricept   DVT prophylaxis: Heparin     Code Status: Not on file  Consults called: Nephrology  Family Communication: ***  Severity of Illness: {Observation/Inpatient:21159}  Level of care: Telemetry Medical   This record has been created using Conservation officer, historic buildings. Errors have been sought and corrected, but may not always be located. Such creation errors do not reflect on the standard of care.   Lou Claretta HERO, MD 07/17/2023, 11:43 PM [facility] Pager: [phone] Isaiah 41:10   If 7PM-7AM, please contact night-coverage [email or web address] Password [email or web address]

## 2023-07-18 ENCOUNTER — Encounter: Payer: Self-pay | Admitting: Student

## 2023-07-18 ENCOUNTER — Inpatient Hospital Stay

## 2023-07-18 ENCOUNTER — Other Ambulatory Visit: Payer: Self-pay

## 2023-07-18 ENCOUNTER — Inpatient Hospital Stay
Admit: 2023-07-18 | Discharge: 2023-07-18 | Disposition: A | Discharge status: Home / Self Care | Attending: Student | Admitting: Student

## 2023-07-18 DIAGNOSIS — I161 Hypertensive emergency: Secondary | ICD-10-CM | POA: Diagnosis not present

## 2023-07-18 DIAGNOSIS — R0602 Shortness of breath: Principal | ICD-10-CM

## 2023-07-18 DIAGNOSIS — R7989 Other specified abnormal findings of blood chemistry: Secondary | ICD-10-CM

## 2023-07-18 DIAGNOSIS — J810 Acute pulmonary edema: Secondary | ICD-10-CM | POA: Diagnosis not present

## 2023-07-18 DIAGNOSIS — E876 Hypokalemia: Secondary | ICD-10-CM

## 2023-07-18 LAB — TROPONIN I (HIGH SENSITIVITY)
Troponin I (High Sensitivity): 259 ng/L (ref <18)
Troponin I (High Sensitivity): 441 ng/L (ref <18)
Troponin I (High Sensitivity): 545 ng/L (ref <18)
Troponin I (High Sensitivity): 547 ng/L (ref <18)
Troponin I (High Sensitivity): 555 ng/L (ref <18)
Troponin I (High Sensitivity): 592 ng/L (ref <18)
Troponin I (High Sensitivity): 605 ng/L (ref <18)

## 2023-07-18 LAB — RENAL FUNCTION PANEL
Albumin: 2.9 g/dL — ABNORMAL LOW (ref 3.5–5.0)
Anion gap: 10 (ref 5–15)
BUN: 39 mg/dL — ABNORMAL HIGH (ref 8–23)
CO2: 23 mmol/L (ref 22–32)
Calcium: 8.4 mg/dL — ABNORMAL LOW (ref 8.9–10.3)
Chloride: 109 mmol/L (ref 98–111)
Creatinine, Ser: 3 mg/dL — ABNORMAL HIGH (ref 0.61–1.24)
GFR, Estimated: 20 mL/min — ABNORMAL LOW (ref >60)
Glucose, Bld: 110 mg/dL — ABNORMAL HIGH (ref 70–99)
Phosphorus: 2.9 mg/dL (ref 2.5–4.6)
Potassium: 3.2 mmol/L — ABNORMAL LOW (ref 3.5–5.1)
Sodium: 142 mmol/L (ref 135–145)

## 2023-07-18 LAB — LACTIC ACID, PLASMA
Lactic Acid, Venous: 0.8 mmol/L (ref 0.5–1.9)
Lactic Acid, Venous: 1 mmol/L (ref 0.5–1.9)
Lactic Acid, Venous: 1.2 mmol/L (ref 0.5–1.9)

## 2023-07-18 LAB — CBC
HCT: 30.8 % — ABNORMAL LOW (ref 39.0–52.0)
Hemoglobin: 9.5 g/dL — ABNORMAL LOW (ref 13.0–17.0)
MCH: 25.9 pg — ABNORMAL LOW (ref 26.0–34.0)
MCHC: 30.8 g/dL (ref 30.0–36.0)
MCV: 83.9 fL (ref 80.0–100.0)
Platelets: 176 K/uL (ref 150–400)
RBC: 3.67 MIL/uL — ABNORMAL LOW (ref 4.22–5.81)
RDW: 16.7 % — ABNORMAL HIGH (ref 11.5–15.5)
WBC: 6.7 K/uL (ref 4.0–10.5)
nRBC: 0 % (ref 0.0–0.2)

## 2023-07-18 LAB — CBG MONITORING, ED
Glucose-Capillary: 103 mg/dL — ABNORMAL HIGH (ref 70–99)
Glucose-Capillary: 86 mg/dL (ref 70–99)

## 2023-07-18 LAB — ECHOCARDIOGRAM COMPLETE
AR max vel: 1.58 cm2
AV Area VTI: 1.61 cm2
AV Area mean vel: 1.57 cm2
AV Mean grad: 20.8 mmHg
AV Peak grad: 41.7 mmHg
Ao pk vel: 3.23 m/s
Area-P 1/2: 3.42 cm2
Height: 77 in
MV M vel: 6.49 m/s
MV Peak grad: 168.5 mmHg
P 1/2 time: 505 ms
S' Lateral: 3.9 cm
Weight: 4176 [oz_av]

## 2023-07-18 LAB — HEPARIN LEVEL (UNFRACTIONATED)
Heparin Unfractionated: 0.17 [IU]/mL — ABNORMAL LOW (ref 0.30–0.70)
Heparin Unfractionated: 0.41 [IU]/mL (ref 0.30–0.70)
Heparin Unfractionated: 0.25 [IU]/mL — ABNORMAL LOW (ref 0.30–0.70)

## 2023-07-18 LAB — HEMOGLOBIN A1C
Hgb A1c MFr Bld: 5.2 % (ref 4.8–5.6)
Mean Plasma Glucose: 102.54 mg/dL

## 2023-07-18 LAB — GLUCOSE, CAPILLARY: Glucose-Capillary: 85 mg/dL (ref 70–99)

## 2023-07-18 MED ORDER — INSULIN ASPART 100 UNIT/ML IJ SOLN
0.0000 [IU] | Freq: Three times a day (TID) | INTRAMUSCULAR | Status: DC
  Filled 2023-07-18: qty 1

## 2023-07-18 MED ORDER — FUROSEMIDE 10 MG/ML IJ SOLN
40.0000 mg | Freq: Three times a day (TID) | INTRAMUSCULAR | Status: DC
  Administered 2023-07-18 – 2023-07-21 (×10): 40 mg via INTRAVENOUS
  Filled 2023-07-18 (×10): qty 4

## 2023-07-18 MED ORDER — SODIUM CHLORIDE 0.9 % IV SOLN
500.0000 mg | INTRAVENOUS | Status: DC
  Administered 2023-07-18 – 2023-07-19 (×2): 500 mg via INTRAVENOUS
  Filled 2023-07-18 (×4): qty 5

## 2023-07-18 MED ORDER — HEPARIN BOLUS VIA INFUSION
1700.0000 [IU] | Freq: Once | INTRAVENOUS | Status: AC
  Administered 2023-07-18: 1700 [IU] via INTRAVENOUS
  Filled 2023-07-18: qty 1700

## 2023-07-18 MED ORDER — SPIRONOLACTONE 12.5 MG HALF TABLET
12.5000 mg | ORAL_TABLET | Freq: Every day | ORAL | Status: DC
  Administered 2023-07-18 – 2023-07-20 (×3): 12.5 mg via ORAL
  Filled 2023-07-18 (×4): qty 1

## 2023-07-18 MED ORDER — POTASSIUM CHLORIDE CRYS ER 20 MEQ PO TBCR
40.0000 meq | EXTENDED_RELEASE_TABLET | Freq: Two times a day (BID) | ORAL | Status: DC
  Administered 2023-07-18: 40 meq via ORAL
  Filled 2023-07-18: qty 2

## 2023-07-18 MED ORDER — HEPARIN BOLUS VIA INFUSION
3400.0000 [IU] | Freq: Once | INTRAVENOUS | Status: AC
  Administered 2023-07-18: 3400 [IU] via INTRAVENOUS
  Filled 2023-07-18: qty 3400

## 2023-07-18 MED ORDER — SODIUM CHLORIDE 0.9 % IV SOLN
1.0000 g | INTRAVENOUS | Status: DC
  Administered 2023-07-18 – 2023-07-19 (×2): 1 g via INTRAVENOUS
  Filled 2023-07-18 (×4): qty 10

## 2023-07-18 MED ORDER — METOPROLOL TARTRATE 50 MG PO TABS
50.0000 mg | ORAL_TABLET | Freq: Three times a day (TID) | ORAL | Status: DC
  Administered 2023-07-18 – 2023-07-19 (×4): 50 mg via ORAL
  Filled 2023-07-18 (×3): qty 1

## 2023-07-18 NOTE — Plan of Care (Signed)

## 2023-07-18 NOTE — ED Notes (Signed)
 External catheter placed by verbal order from attending Dr. Oley

## 2023-07-18 NOTE — Progress Notes (Signed)
 PHARMACY - ANTICOAGULATION CONSULT NOTE  Pharmacy Consult for heparin  Indication: chest pain/ACS  No Known Allergies  Patient Measurements: Height: 6' 5 (195.6 cm) Weight: 118.4 kg (261 lb) IBW/kg (Calculated) : 89.1 HEPARIN  DW (KG): 113.5  Vital Signs: Temp: 98.5 F (36.9 C) (07/06 1956) Temp Source: Oral (07/06 1956) BP: 152/69 (07/06 1956) Pulse Rate: 71 (07/06 1956)  Labs: Recent Labs    07/17/23 2042 07/17/23 2344 07/18/23 0342 07/18/23 0454 07/18/23 0852 07/18/23 1345 07/18/23 1630 07/18/23 1735 07/18/23 2050 07/18/23 2200  HGB 10.1*  --   --  9.5*  --   --   --   --   --   --   HCT 33.6*  --   --  30.8*  --   --   --   --   --   --   PLT 187  --   --  176  --   --   --   --   --   --   LABPROT 17.0*  --   --   --   --   --   --   --   --   --   INR 1.3*  --   --   --   --   --   --   --   --   --   HEPARINUNFRC  --   --   --  0.17*  --  0.41  --   --   --  0.25*  CREATININE 3.20*  --  3.00*  --   --   --   --   --   --   --   TROPONINIHS 154*   < > 545*  --    < >  --  555* 605* 547*  --    < > = values in this interval not displayed.    Estimated Creatinine Clearance: 26.6 mL/min (A) (by C-G formula based on SCr of 3 mg/dL (H)).   Medical History: Past Medical History:  Diagnosis Date   Anxiety    Arthritis    Depression    Diabetes mellitus without complication (HCC)    Hypertension    Pneumonia    Renal disorder    Assessment: 83 y/o male presenting with shortness of breath - found to have elevated troponin levels. PMH significant for anxiety, depression, diabetes, hypertension. Pharmacy has been consulted to initiate heparin  infusion. Per chart review, patient was not on anticoagulation prior to admission.  Baseline labs: hgb 10.1, plt 187, INR pending  7/6: HL @ 0454 = 0.17, SUBtherapeutic  7/6 1345 HL 0.41,  therapeutic x 1 7/6 2200 HL 0.25,  SUBtherapeutic    Goal of Therapy:  Heparin  level 0.3-0.7 units/ml Monitor platelets by  anticoagulation protocol: Yes   Plan:  7/6:  HL @ 2200 = 0.25, SUBtherapeutic - Will order heparin  1700 units IV X 1 bolus and increase drip rate to 2050 units/hr - Recheck HL 8 hrs after rate change  Thank you for involving pharmacy in this patient's care.   Buffey Zabinski D Clinical Pharmacist 07/18/2023 10:37 PM

## 2023-07-18 NOTE — Progress Notes (Signed)
 [facility]  ROUNDING NOTE   Subjective:   Mr. AVYUKT CIMO was admitted to [facility] on 07/17/2023 for Acute pulmonary edema (HCC) [J81.0]  Patient was having 3 to 4 days of progressive shortness of breath and peripheral edema.   Daughters at bedside who assist with history taking.   Tmax 100.3  Treated with IV furosemide : UOP 3L  Last seen by Nephrology on 5/7 by Dr. Marcelino.   Objective:  Vital signs in last 24 hours:  Temp:  [97.9 F (36.6 C)-100.3 F (37.9 C)] 98.8 F (37.1 C) (07/06 0924) Pulse Rate:  [57-87] 63 (07/06 0924) Resp:  [20-33] 22 (07/06 0924) BP: (125-194)/(11-111) 145/51 (07/06 0924) SpO2:  [91 %-94 %] 94 % (07/06 0924) Weight:  [118.4 kg] 118.4 kg (07/05 2028)  Weight change:  Filed Weights   07/17/23 2028  Weight: 118.4 kg    Intake/Output: I/O last 3 completed shifts: In: 200 [P.O.:200] Out: 3025 [Urine:3025]   Intake/Output this shift:  Total I/O In: -  Out: 1000 [Urine:1000]  Physical Exam: General: NAD, sitting up in bed  Head: Normocephalic, atraumatic. Moist oral mucosal membranes  Eyes: Anicteric, PERRL  Neck: Supple, trachea midline  Lungs:  Bilateral crackles at bases  Heart: Regular rate and rhythm  Abdomen:  Soft, nontender, +abdominal wall edema  Extremities:  + peripheral edema.  Neurologic: Nonfocal, moving all four extremities  Skin: No lesions  Access: Left AVF +thrill +bruit    Basic Metabolic Panel: Recent Labs  Lab 07/17/23 2042 07/18/23 0342  NA 143 142  K 3.3* 3.2*  CL 110 109  CO2 23 23  GLUCOSE 122* 110*  BUN 40* 39*  CREATININE 3.20* 3.00*  CALCIUM 8.8* 8.4*  PHOS  --  2.9    Liver Function Tests: Recent Labs  Lab 07/17/23 2042 07/18/23 0342  AST 23  --   ALT 35  --   ALKPHOS 54  --   BILITOT 1.0  --   PROT 6.6  --   ALBUMIN 3.2* 2.9*   No results for input(s): LIPASE, AMYLASE in the last 168 hours. No results for input(s): AMMONIA in the last 168 hours.  CBC: Recent  Labs  Lab 07/17/23 2042 07/18/23 0454  WBC 7.5 6.7  HGB 10.1* 9.5*  HCT 33.6* 30.8*  MCV 85.1 83.9  PLT 187 176    Cardiac Enzymes: No results for input(s): CKTOTAL, CKMB, CKMBINDEX, TROPONINI in the last 168 hours.  BNP: Invalid input(s): POCBNP  CBG: Recent Labs  Lab 07/18/23 0749 07/18/23 1131  GLUCAP 103* 86    Microbiology: Results for orders placed or performed during the hospital encounter of 07/17/23  Resp panel by RT-PCR (RSV, Flu A&B, Covid) Anterior Nasal Swab     Status: None   Collection Time: 07/17/23  8:42 PM   Specimen: Anterior Nasal Swab  Result Value Ref Range Status   SARS Coronavirus 2 by RT PCR NEGATIVE NEGATIVE Final    Comment: (NOTE) SARS-CoV-2 target nucleic acids are NOT DETECTED.  The SARS-CoV-2 RNA is generally detectable in upper respiratory specimens during the acute phase of infection. The lowest concentration of SARS-CoV-2 viral copies this assay can detect is 138 copies/mL. A negative result does not preclude SARS-Cov-2 infection and should not be used as the sole basis for treatment or other patient management decisions. A negative result may occur with  improper specimen collection/handling, submission of specimen other than nasopharyngeal swab, presence of viral mutation(s) within the areas targeted by this assay, and  inadequate number of viral copies(<138 copies/mL). A negative result must be combined with clinical observations, patient history, and epidemiological information. The expected result is Negative.  Fact Sheet for Patients:  BloggerCourse.com  Fact Sheet for Healthcare Providers:  SeriousBroker.it  This test is no t yet approved or cleared by the [place]  FDA and  has been authorized for detection and/or diagnosis of SARS-CoV-2 by FDA under an Emergency Use Authorization (EUA). This EUA will remain  in effect (meaning this test can be used) for the  duration of the COVID-19 declaration under Section 564(b)(1) of the Act, 21 U.S.C.section 360bbb-3(b)(1), unless the authorization is terminated  or revoked sooner.       Influenza A by PCR NEGATIVE NEGATIVE Final   Influenza B by PCR NEGATIVE NEGATIVE Final    Comment: (NOTE) The Xpert Xpress SARS-CoV-2/FLU/RSV plus assay is intended as an aid in the diagnosis of influenza from Nasopharyngeal swab specimens and should not be used as a sole basis for treatment. Nasal washings and aspirates are unacceptable for Xpert Xpress SARS-CoV-2/FLU/RSV testing.  Fact Sheet for Patients: BloggerCourse.com  Fact Sheet for Healthcare Providers: SeriousBroker.it  This test is not yet approved or cleared by the [place]  FDA and has been authorized for detection and/or diagnosis of SARS-CoV-2 by FDA under an Emergency Use Authorization (EUA). This EUA will remain in effect (meaning this test can be used) for the duration of the COVID-19 declaration under Section 564(b)(1) of the Act, 21 U.S.C. section 360bbb-3(b)(1), unless the authorization is terminated or revoked.     Resp Syncytial Virus by PCR NEGATIVE NEGATIVE Final    Comment: (NOTE) Fact Sheet for Patients: BloggerCourse.com  Fact Sheet for Healthcare Providers: SeriousBroker.it  This test is not yet approved or cleared by the [place]  FDA and has been authorized for detection and/or diagnosis of SARS-CoV-2 by FDA under an Emergency Use Authorization (EUA). This EUA will remain in effect (meaning this test can be used) for the duration of the COVID-19 declaration under Section 564(b)(1) of the Act, 21 U.S.C. section 360bbb-3(b)(1), unless the authorization is terminated or revoked.  Performed at [facility], [address] [place], [place] [address]   Blood culture (routine x 2)     Status: None (Preliminary  result)   Collection Time: 07/17/23  8:42 PM   Specimen: BLOOD  Result Value Ref Range Status   Specimen Description BLOOD RIGHT FOREARM  Final   Special Requests   Final    BOTTLES DRAWN AEROBIC AND ANAEROBIC Blood Culture adequate volume   Culture   Final    NO GROWTH < 12 HOURS Performed at [facility], [address] [place], [place] [address]    Report Status PENDING  Incomplete  Blood culture (routine x 2)     Status: None (Preliminary result)   Collection Time: 07/17/23 10:33 PM   Specimen: BLOOD  Result Value Ref Range Status   Specimen Description BLOOD RIGHT ASSIST CONTROL  Final   Special Requests   Final    BOTTLES DRAWN AEROBIC AND ANAEROBIC Blood Culture results may not be optimal due to an inadequate volume of blood received in culture bottles   Culture   Final    NO GROWTH < 12 HOURS Performed at [facility], [address] [place], [place] [address]    Report Status PENDING  Incomplete    Coagulation Studies: Recent Labs    07/17/23 08-08-2040  LABPROT 17.0*  INR 1.3*    Urinalysis: Recent  Labs    07/17/23 2012  COLORURINE YELLOW*  LABSPEC 1.012  PHURINE 6.0  GLUCOSEU NEGATIVE  HGBUR SMALL*  BILIRUBINUR NEGATIVE  KETONESUR NEGATIVE  PROTEINUR 100*  NITRITE NEGATIVE  LEUKOCYTESUR NEGATIVE      Imaging: ECHOCARDIOGRAM COMPLETE Result Date: 07/18/2023    ECHOCARDIOGRAM REPORT   Patient Name:   YOSMAR RYKER Date of Exam: 07/18/2023 Medical Rec #:  [id number]     Height:       77.0 in Accession #:    [id number]    Weight:       261.0 lb Date of Birth:  November 15, 1940     BSA:          2.505 m Patient Age:    82 years      BP:           157/58 mmHg Patient Gender: M             HR:           63 bpm. Exam Location:  [facility] Procedure: 2D Echo, Cardiac Doppler and Color Doppler (Both Spectral and Color            Flow Doppler were utilized during procedure). Indications:     Dyspnea R06.00  History:         Patient has no prior history of  Echocardiogram examinations.  Sonographer:     Thedora Louder RDCS, FASE Referring Phys:  [id number] CLARETTA HERO AMPONSAH Diagnosing Phys: Cara JONETTA Lovelace MD  Sonographer Comments: This study was performed with the patient positioned supine. IMPRESSIONS  1. Left ventricular ejection fraction, by estimation, is 55 to 60%. The left ventricle has normal function. The left ventricle has no regional wall motion abnormalities. The left ventricular internal cavity size was mildly dilated. There is moderate concentric left ventricular hypertrophy. Left ventricular diastolic parameters are consistent with Grade III diastolic dysfunction (restrictive).  2. Right ventricular systolic function is normal. The right ventricular size is normal.  3. Left atrial size was moderately dilated.  4. Right atrial size was mild to moderately dilated.  5. The mitral valve is grossly normal. Trivial mitral valve regurgitation.  6. Tricuspid valve regurgitation is mild to moderate.  7. The aortic valve is grossly normal. Aortic valve regurgitation is mild. Mild aortic valve stenosis. FINDINGS  Left Ventricle: Left ventricular ejection fraction, by estimation, is 55 to 60%. The left ventricle has normal function. The left ventricle has no regional wall motion abnormalities. Strain was performed and the global longitudinal strain is indeterminate. Global longitudinal strain performed but not reported based on interpreter judgement due to suboptimal tracking. The left ventricular internal cavity size was mildly dilated. There is moderate concentric left ventricular hypertrophy. Left ventricular diastolic parameters are consistent with Grade III diastolic dysfunction (restrictive). Right Ventricle: The right ventricular size is normal. No increase in right ventricular wall thickness. Right ventricular systolic function is normal. Left Atrium: Left atrial size was moderately dilated. Right Atrium: Right atrial size was mild to moderately dilated.  Pericardium: There is no evidence of pericardial effusion. Mitral Valve: The mitral valve is grossly normal. Trivial mitral valve regurgitation. Tricuspid Valve: The tricuspid valve is normal in structure. Tricuspid valve regurgitation is mild to moderate. Aortic Valve: The aortic valve is grossly normal. Aortic valve regurgitation is mild. Aortic regurgitation PHT measures 505 msec. Mild aortic stenosis is present. Aortic valve mean gradient measures 20.8 mmHg. Aortic valve peak gradient measures 41.7 mmHg. Aortic valve area, by VTI measures 1.61 cm. Pulmonic  Valve: The pulmonic valve was normal in structure. Pulmonic valve regurgitation is not visualized. Aorta: The ascending aorta was not well visualized. IAS/Shunts: No atrial level shunt detected by color flow Doppler. Additional Comments: 3D was performed not requiring image post processing on an independent workstation and was indeterminate.  LEFT VENTRICLE PLAX 2D LVIDd:         5.50 cm   Diastology LVIDs:         3.90 cm   LV e' medial:    8.16 cm/s LV PW:         1.50 cm   LV E/e' medial:  19.9 LV IVS:        1.60 cm   LV e' lateral:   12.50 cm/s LVOT diam:     2.10 cm   LV E/e' lateral: 13.0 LV SV:         116 LV SV Index:   46 LVOT Area:     3.46 cm  RIGHT VENTRICLE RV Basal diam:  3.60 cm RV S prime:     14.30 cm/s TAPSE (M-mode): 2.6 cm LEFT ATRIUM             Index        RIGHT ATRIUM           Index LA diam:        5.00 cm 2.00 cm/m   RA Area:     25.20 cm LA Vol (A2C):   93.0 ml 37.13 ml/m  RA Volume:   79.90 ml  31.90 ml/m LA Vol (A4C):   89.0 ml 35.53 ml/m LA Biplane Vol: 94.0 ml 37.53 ml/m  AORTIC VALVE                     PULMONIC VALVE AV Area (Vmax):    1.58 cm      PV Vmax:          1.33 m/s AV Area (Vmean):   1.57 cm      PV Peak grad:     7.1 mmHg AV Area (VTI):     1.61 cm      PR End Diast Vel: 11.42 msec AV Vmax:           323.00 cm/s   RVOT Peak grad:   7 mmHg AV Vmean:          211.000 cm/s AV VTI:            0.725 m AV Peak  Grad:      41.7 mmHg AV Mean Grad:      20.8 mmHg LVOT Vmax:         147.00 cm/s LVOT Vmean:        95.400 cm/s LVOT VTI:          0.336 m LVOT/AV VTI ratio: 0.46 AI PHT:            505 msec  AORTA Ao Root diam: 3.30 cm Ao Asc diam:  2.60 cm MITRAL VALVE                TRICUSPID VALVE MV Area (PHT): 3.42 cm     TR Peak grad:   59.0 mmHg MV Decel Time: 222 msec     TR Vmax:        384.00 cm/s MR Peak grad: 168.5 mmHg MR Mean grad: 117.0 mmHg    SHUNTS MR Vmax:      649.00 cm/s   Systemic VTI:  0.34 m MR  Vmean:     513.0 cm/s    Systemic Diam: 2.10 cm MV E velocity: 162.00 cm/s MV A velocity: 73.90 cm/s MV E/A ratio:  2.19 Cara JONETTA Lovelace MD Electronically signed by Cara JONETTA Lovelace MD Signature Date/Time: 07/18/2023/8:39:21 AM    Final    DG Chest Portable 1 View Result Date: 07/17/2023 CLINICAL DATA:  sob EXAM: PORTABLE CHEST 1 VIEW COMPARISON:  None Available. FINDINGS: Cardiac enlargement with some portion likely due to AP portable technique. Otherwise the heart and mediastinal contours are within normal limits. No focal consolidation. Pulmonary edema. No pleural effusion. No pneumothorax. No acute osseous abnormality. IMPRESSION: 1. Pulmonary edema. 2. Cardiac enlargement with some portion likely due to AP portable technique. 3. Consider repeat chest x-ray PA and lateral view for further evaluation. Electronically Signed   By: Morgane  Naveau M.D.   On: 07/17/2023 21:07     Medications:    heparin  1,850 Units/hr (07/18/23 0555)    amLODipine   10 mg Oral Daily   aspirin  EC  81 mg Oral Daily   cholecalciferol   5,000 Units Oral Daily   clopidogrel   75 mg Oral Daily   docusate sodium   100 mg Oral QHS   donepezil   10 mg Oral QHS   enalapril   20 mg Oral BID   finasteride   5 mg Oral Daily   furosemide   40 mg Intravenous Q8H   glipiZIDE   5 mg Oral Q breakfast   hydrALAZINE   100 mg Oral TID   insulin  aspart  0-6 Units Subcutaneous TID WC   metoprolol  tartrate  25 mg Oral BH-qamhs   potassium  chloride  40 mEq Oral BID   sodium bicarbonate   650 mg Oral BID   tamsulosin   0.4 mg Oral Daily   acetaminophen  **OR** acetaminophen , ALPRAZolam , ondansetron  **OR** ondansetron  (ZOFRAN ) IV, senna-docusate  Assessment/ Plan:  Mr. DUSTEN ELLINWOOD is a 83 y.o.  male with chronic kidney disease stage IV, hypertension, diabetes mellitus type II, BPH, peripheral arterial disease, and diastolic congestive heart failure who presents on 07/17/2023 for Acute pulmonary edema (HCC) [J81.0]  Chronic Kidney Disease stage IV with proteinuria: baseline creatinine 3.44, GFR of 17.  - no acute indication for dialysis - nonoliguric. Responding to IV furosemide .  - continue enalapril .  - continue sodium bicarbonate   Acute exacerbation of chronic diastolic congestive heart failure - IV furosemide  - start spironolactone  25mg  daily  Hypertension with chronic kidney disease - continue IV furosemide  - start spironolactone  - continue enalapril , hydralazine , metoprolol , tamsulosin , and amlodipine   Diabetes mellitus type II with chronic kidney disease: hemoglobin A1c of 5.2% - continue glucose control.   Hypokalemia: with history of hyperkalemia - discontinue potassium chloride  - start spironolactone .    LOS: 1 Akira Adelsberger 7/6/202512:21 PM

## 2023-07-18 NOTE — ED Notes (Signed)
 Advised nurse that patient has ready bed

## 2023-07-18 NOTE — Progress Notes (Signed)
  Echocardiogram 2D Echocardiogram has been performed.  [name] 07/18/2023, 8:18 AM

## 2023-07-18 NOTE — Progress Notes (Signed)
 Patient ID: OWENN ROTHERMEL, male   DOB: Sep 18, 1940, 83 y.o.   MRN: [id number]  Asked to see patient with slightly elevated heart rate.  EKG suggest sinus tachycardia with PVCs.  Patient temperatures 100.4.  Patient states he feels fine better than he did yesterday no worsening shortness of breath no chest pain denies any palpitations or tachycardia.  Daughter at bedside and concerned about his heart rate his temperature and his troponins.  Troponins 2 may have flat in the 5 or 600 range probably demand ischemia related to his renal failure and congestive heart failure. I recommend increasing metoprolol  to 50 mg 3 times a day Tylenol  for temperature chest x-ray to rule out pneumonia evaluate for source of possible infection  Patient exam is unchanged.  He is resting comfortably feels reasonably well asymptomatic would recommend conservative therapy at this stage Echocardiogram with preserved left ventricular function chest x-ray suggest pulmonary edema BNP over 24,000.  Patient responded to IV Lasix  and is less dyspneic  Recommend continue IV diuresis increase beta-blockade therapy for blood pressure management and heart rate control  Follow-up nephrology's lead in terms of diuresis and renal function  Will continue to follow patient conservatively Would not recommend any invasive cardiac procedures at this point   [facility] Cardiology

## 2023-07-18 NOTE — Progress Notes (Signed)
 1      PROGRESS NOTE    Jeffery Joseph  FMW:[id number] DOB: 1940-12-24 DOA: 07/17/2023 PCP: Epifanio Alm SQUIBB, MD    Brief Narrative:   83 y.o. 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 presented with progressive shortness of breath and lower extremity edema - Remains on room air but slightly tachypneic and SPO2 in the low 90s on admission - CXR shows cardiomegaly and pulmonary edema - Continue IV Lasix  40 mg every 8 hours - Pending echocardiogram -Starting spironolactone  25 mg once daily - Cardiology and nephrology consult   # Hypertensive emergency # Acute symptomatic hypertension - Found to have SBP in the 170s-190s on admission with evidence of acute pulmonary edema - SBP improved to the 120s to 130s after IV Lasix  and resuming home BP meds - Continue amlodipine , Lopressor , hydralazine  and enalapril .  Start spironolactone  25 mg daily - Follow-up echocardiogram   # Elevated troponin - Patient found to have troponin of 154 on admission.  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  Continue IV Lasix  40 mg every 8 hours - KCl 40 mEq twice daily x 2 doses - Continue sodium bicarb - Avoid nephrotoxic agents   # T2DM - Hemoglobin A1c of 5.2 - Blood glucose of 122 on CMP - Continue glipizide  - SSI with meals, CBG monitoring   # Anemia of chronic disease - Hgb stable at 10.1 - Trend CBC   # PAD - Continue aspirin  and Plavix    # BPH - Continue finasteride  and tamsulosin    # Anxiety and depression - Continue as needed Xanax    # Dementia - Continue Aricept    DVT prophylaxis: Heparin  drip      Code Status: (Full code Family Communication: Discussed with patient's daughter/Dava Disposition Plan: Possible discharge in next 2 to 3 days depending on cardiology, nephrology workup and his clinical condition   Consultants:  Cardiology Nephrology     Subjective:  He is somewhat less short of breath   Objective: Vitals:   07/18/23 0511 07/18/23 0611 07/18/23 0924 07/18/23 1200  BP:  (!) 157/58 (!) 145/51 (!) 147/62  Pulse:  67 63 (!) 56  Resp:  (!) 22 (!) 22 (!) 22  Temp: 98.7 F (37.1 C)  98.8 F (37.1 C)   TempSrc: Oral  Oral   SpO2:  94% 94% (!) 89%  Weight:      Height:  Intake/Output Summary (Last 24 hours) at 07/18/2023 1524 Last data filed at 07/18/2023 1454 Gross per 24 hour  Intake 200 ml  Output 5725 ml  Net -5525 ml   Filed Weights   07/17/23 2028  Weight: 118.4 kg    Examination:  General exam: Appears calm and comfortable  Respiratory system: Clear to auscultation. Respiratory effort normal. Cardiovascular system: S1 & S2 heard, RRR.  Crackles at the bases.  2+ pedal edema Gastrointestinal system: Abdomen is soft, benign, abdominal wall edema present Central nervous system: Alert and oriented. No focal neurological deficits. Extremities: Symmetric 5 x 5 power. Skin: No rashes, lesions or  ulcers Psychiatry: Judgement and insight appear normal. Mood & affect appropriate.  Left AV fistula for dialysis access   Data Reviewed: I have personally reviewed following labs and imaging studies  CBC: Recent Labs  Lab 07/17/23 2042 07/18/23 0454  WBC 7.5 6.7  HGB 10.1* 9.5*  HCT 33.6* 30.8*  MCV 85.1 83.9  PLT 187 176   Basic Metabolic Panel: Recent Labs  Lab 07/17/23 2042 07/18/23 0342  NA 143 142  K 3.3* 3.2*  CL 110 109  CO2 23 23  GLUCOSE 122* 110*  BUN 40* 39*  CREATININE 3.20* 3.00*  CALCIUM 8.8* 8.4*  PHOS  --  2.9   GFR: Estimated Creatinine Clearance: 26.6 mL/min (A) (by C-G formula based on SCr of 3 mg/dL (H)). Liver Function Tests: Recent Labs  Lab 07/17/23 2042 07/18/23 0342  AST 23  --   ALT 35  --   ALKPHOS 54  --   BILITOT 1.0  --   PROT 6.6  --   ALBUMIN 3.2* 2.9*   No results for input(s): LIPASE, AMYLASE in the last 168 hours. No results for input(s): AMMONIA in the last 168 hours. Coagulation Profile: Recent Labs  Lab 07/17/23 2042  INR 1.3*   Cardiac Enzymes: No results for input(s): CKTOTAL, CKMB, CKMBINDEX, TROPONINI in the last 168 hours. BNP (last 3 results) No results for input(s): PROBNP in the last 8760 hours. HbA1C: Recent Labs    07/18/23 0454  HGBA1C 5.2   CBG: Recent Labs  Lab 07/18/23 0749 07/18/23 1131  GLUCAP 103* 86   Lipid Profile: No results for input(s): CHOL, HDL, LDLCALC, TRIG, CHOLHDL, LDLDIRECT in the last 72 hours. Thyroid Function Tests: No results for input(s): TSH, T4TOTAL, FREET4, T3FREE, THYROIDAB in the last 72 hours. Anemia Panel: No results for input(s): VITAMINB12, FOLATE, FERRITIN, TIBC, IRON, RETICCTPCT in the last 72 hours. Sepsis Labs: Recent Labs  Lab 07/17/23 2042 07/17/23 2344  LATICACIDVEN 1.2 1.2    Recent Results (from the past 240 hours)  Resp panel by RT-PCR (RSV, Flu A&B, Covid) Anterior Nasal Swab     Status:  None   Collection Time: 07/17/23  8:42 PM   Specimen: Anterior Nasal Swab  Result Value Ref Range Status   SARS Coronavirus 2 by RT PCR NEGATIVE NEGATIVE Final    Comment: (NOTE) SARS-CoV-2 target nucleic acids are NOT DETECTED.  The SARS-CoV-2 RNA is generally detectable in upper respiratory specimens during the acute phase of infection. The lowest concentration of SARS-CoV-2 viral copies this assay can detect is 138 copies/mL. A negative result does not preclude SARS-Cov-2 infection and should not be used as the sole basis for treatment or other patient management decisions. A negative result may occur with  improper specimen collection/handling, submission of specimen other than nasopharyngeal swab, presence of viral mutation(s) within the areas targeted by this assay, and  inadequate number of viral copies(<138 copies/mL). A negative result must be combined with clinical observations, patient history, and epidemiological information. The expected result is Negative.  Fact Sheet for Patients:  BloggerCourse.com  Fact Sheet for Healthcare Providers:  SeriousBroker.it  This test is no t yet approved or cleared by the [place]  FDA and  has been authorized for detection and/or diagnosis of SARS-CoV-2 by FDA under an Emergency Use Authorization (EUA). This EUA will remain  in effect (meaning this test can be used) for the duration of the COVID-19 declaration under Section 564(b)(1) of the Act, 21 U.S.C.section 360bbb-3(b)(1), unless the authorization is terminated  or revoked sooner.       Influenza A by PCR NEGATIVE NEGATIVE Final   Influenza B by PCR NEGATIVE NEGATIVE Final    Comment: (NOTE) The Xpert Xpress SARS-CoV-2/FLU/RSV plus assay is intended as an aid in the diagnosis of influenza from Nasopharyngeal swab specimens and should not be used as a sole basis for treatment. Nasal washings and aspirates are unacceptable  for Xpert Xpress SARS-CoV-2/FLU/RSV testing.  Fact Sheet for Patients: BloggerCourse.com  Fact Sheet for Healthcare Providers: SeriousBroker.it  This test is not yet approved or cleared by the [place]  FDA and has been authorized for detection and/or diagnosis of SARS-CoV-2 by FDA under an Emergency Use Authorization (EUA). This EUA will remain in effect (meaning this test can be used) for the duration of the COVID-19 declaration under Section 564(b)(1) of the Act, 21 U.S.C. section 360bbb-3(b)(1), unless the authorization is terminated or revoked.     Resp Syncytial Virus by PCR NEGATIVE NEGATIVE Final    Comment: (NOTE) Fact Sheet for Patients: BloggerCourse.com  Fact Sheet for Healthcare Providers: SeriousBroker.it  This test is not yet approved or cleared by the [place]  FDA and has been authorized for detection and/or diagnosis of SARS-CoV-2 by FDA under an Emergency Use Authorization (EUA). This EUA will remain in effect (meaning this test can be used) for the duration of the COVID-19 declaration under Section 564(b)(1) of the Act, 21 U.S.C. section 360bbb-3(b)(1), unless the authorization is terminated or revoked.  Performed at [facility], [address] [place], [place] [address]   Blood culture (routine x 2)     Status: None (Preliminary result)   Collection Time: 07/17/23  8:42 PM   Specimen: BLOOD  Result Value Ref Range Status   Specimen Description BLOOD RIGHT FOREARM  Final   Special Requests   Final    BOTTLES DRAWN AEROBIC AND ANAEROBIC Blood Culture adequate volume   Culture   Final    NO GROWTH < 12 HOURS Performed at [facility], [address], [place], [place] [address]    Report Status PENDING  Incomplete  Blood culture (routine x 2)     Status: None (Preliminary result)   Collection Time: 07/17/23 10:33 PM    Specimen: BLOOD  Result Value Ref Range Status   Specimen Description BLOOD RIGHT ASSIST CONTROL  Final   Special Requests   Final    BOTTLES DRAWN AEROBIC AND ANAEROBIC Blood Culture results may not be optimal due to an inadequate volume of blood received in culture bottles   Culture   Final    NO GROWTH < 12 HOURS Performed at [facility], [address], [place], [place] [address]    Report Status PENDING  Incomplete         Radiology Studies: ECHOCARDIOGRAM COMPLETE Result Date: 07/18/2023    ECHOCARDIOGRAM REPORT   Patient  Name:   Jeffery Joseph Date of Exam: 07/18/2023 Medical Rec #:  [id number]     Height:       77.0 in Accession #:    [id number]    Weight:       261.0 lb Date of Birth:  1940/04/27     BSA:          2.505 m Patient Age:    82 years      BP:           157/58 mmHg Patient Gender: M             HR:           63 bpm. Exam Location:  [facility] Procedure: 2D Echo, Cardiac Doppler and Color Doppler (Both Spectral and Color            Flow Doppler were utilized during procedure). Indications:     Dyspnea R06.00  History:         Patient has no prior history of Echocardiogram examinations.  Sonographer:     Thedora Louder RDCS, FASE Referring Phys:  [id number] CLARETTA HERO AMPONSAH Diagnosing Phys: Cara JONETTA Lovelace MD  Sonographer Comments: This study was performed with the patient positioned supine. IMPRESSIONS  1. Left ventricular ejection fraction, by estimation, is 55 to 60%. The left ventricle has normal function. The left ventricle has no regional wall motion abnormalities. The left ventricular internal cavity size was mildly dilated. There is moderate concentric left ventricular hypertrophy. Left ventricular diastolic parameters are consistent with Grade III diastolic dysfunction (restrictive).  2. Right ventricular systolic function is normal. The right ventricular size is normal.  3. Left atrial size was moderately dilated.  4. Right atrial size was mild to moderately dilated.   5. The mitral valve is grossly normal. Trivial mitral valve regurgitation.  6. Tricuspid valve regurgitation is mild to moderate.  7. The aortic valve is grossly normal. Aortic valve regurgitation is mild. Mild aortic valve stenosis. FINDINGS  Left Ventricle: Left ventricular ejection fraction, by estimation, is 55 to 60%. The left ventricle has normal function. The left ventricle has no regional wall motion abnormalities. Strain was performed and the global longitudinal strain is indeterminate. Global longitudinal strain performed but not reported based on interpreter judgement due to suboptimal tracking. The left ventricular internal cavity size was mildly dilated. There is moderate concentric left ventricular hypertrophy. Left ventricular diastolic parameters are consistent with Grade III diastolic dysfunction (restrictive). Right Ventricle: The right ventricular size is normal. No increase in right ventricular wall thickness. Right ventricular systolic function is normal. Left Atrium: Left atrial size was moderately dilated. Right Atrium: Right atrial size was mild to moderately dilated. Pericardium: There is no evidence of pericardial effusion. Mitral Valve: The mitral valve is grossly normal. Trivial mitral valve regurgitation. Tricuspid Valve: The tricuspid valve is normal in structure. Tricuspid valve regurgitation is mild to moderate. Aortic Valve: The aortic valve is grossly normal. Aortic valve regurgitation is mild. Aortic regurgitation PHT measures 505 msec. Mild aortic stenosis is present. Aortic valve mean gradient measures 20.8 mmHg. Aortic valve peak gradient measures 41.7 mmHg. Aortic valve area, by VTI measures 1.61 cm. Pulmonic Valve: The pulmonic valve was normal in structure. Pulmonic valve regurgitation is not visualized. Aorta: The ascending aorta was not well visualized. IAS/Shunts: No atrial level shunt detected by color flow Doppler. Additional Comments: 3D was performed not requiring  image post processing on an independent workstation and was indeterminate.  LEFT VENTRICLE PLAX 2D LVIDd:  5.50 cm   Diastology LVIDs:         3.90 cm   LV e' medial:    8.16 cm/s LV PW:         1.50 cm   LV E/e' medial:  19.9 LV IVS:        1.60 cm   LV e' lateral:   12.50 cm/s LVOT diam:     2.10 cm   LV E/e' lateral: 13.0 LV SV:         116 LV SV Index:   46 LVOT Area:     3.46 cm  RIGHT VENTRICLE RV Basal diam:  3.60 cm RV S prime:     14.30 cm/s TAPSE (M-mode): 2.6 cm LEFT ATRIUM             Index        RIGHT ATRIUM           Index LA diam:        5.00 cm 2.00 cm/m   RA Area:     25.20 cm LA Vol (A2C):   93.0 ml 37.13 ml/m  RA Volume:   79.90 ml  31.90 ml/m LA Vol (A4C):   89.0 ml 35.53 ml/m LA Biplane Vol: 94.0 ml 37.53 ml/m  AORTIC VALVE                     PULMONIC VALVE AV Area (Vmax):    1.58 cm      PV Vmax:          1.33 m/s AV Area (Vmean):   1.57 cm      PV Peak grad:     7.1 mmHg AV Area (VTI):     1.61 cm      PR End Diast Vel: 11.42 msec AV Vmax:           323.00 cm/s   RVOT Peak grad:   7 mmHg AV Vmean:          211.000 cm/s AV VTI:            0.725 m AV Peak Grad:      41.7 mmHg AV Mean Grad:      20.8 mmHg LVOT Vmax:         147.00 cm/s LVOT Vmean:        95.400 cm/s LVOT VTI:          0.336 m LVOT/AV VTI ratio: 0.46 AI PHT:            505 msec  AORTA Ao Root diam: 3.30 cm Ao Asc diam:  2.60 cm MITRAL VALVE                TRICUSPID VALVE MV Area (PHT): 3.42 cm     TR Peak grad:   59.0 mmHg MV Decel Time: 222 msec     TR Vmax:        384.00 cm/s MR Peak grad: 168.5 mmHg MR Mean grad: 117.0 mmHg    SHUNTS MR Vmax:      649.00 cm/s   Systemic VTI:  0.34 m MR Vmean:     513.0 cm/s    Systemic Diam: 2.10 cm MV E velocity: 162.00 cm/s MV A velocity: 73.90 cm/s MV E/A ratio:  2.19 Dwayne D Callwood MD Electronically signed by Cara JONETTA Lovelace MD Signature Date/Time: 07/18/2023/8:39:21 AM    Final    DG Chest Portable 1 View Result Date: 07/17/2023 CLINICAL DATA:  sob EXAM:  PORTABLE  CHEST 1 VIEW COMPARISON:  None Available. FINDINGS: Cardiac enlargement with some portion likely due to AP portable technique. Otherwise the heart and mediastinal contours are within normal limits. No focal consolidation. Pulmonary edema. No pleural effusion. No pneumothorax. No acute osseous abnormality. IMPRESSION: 1. Pulmonary edema. 2. Cardiac enlargement with some portion likely due to AP portable technique. 3. Consider repeat chest x-ray PA and lateral view for further evaluation. Electronically Signed   By: Morgane  Naveau M.D.   On: 07/17/2023 21:07        Scheduled Meds:  amLODipine   10 mg Oral Daily   aspirin  EC  81 mg Oral Daily   cholecalciferol   5,000 Units Oral Daily   clopidogrel   75 mg Oral Daily   docusate sodium   100 mg Oral QHS   donepezil   10 mg Oral QHS   enalapril   20 mg Oral BID   finasteride   5 mg Oral Daily   furosemide   40 mg Intravenous Q8H   glipiZIDE   5 mg Oral Q breakfast   hydrALAZINE   100 mg Oral TID   insulin  aspart  0-6 Units Subcutaneous TID WC   metoprolol  tartrate  25 mg Oral BH-qamhs   sodium bicarbonate   650 mg Oral BID   spironolactone   12.5 mg Oral Daily   tamsulosin   0.4 mg Oral Daily   Continuous Infusions:  heparin  1,850 Units/hr (07/18/23 1454)     LOS: 1 day    Time spent: 35 minutes    Tremel Setters Maree, MD [facility] Pager [phone]  If 7PM-7AM, please contact night-coverage [email or web address] Password [email or web address] 07/18/2023, 3:24 PM

## 2023-07-18 NOTE — Progress Notes (Signed)
 PHARMACY - ANTICOAGULATION CONSULT NOTE  Pharmacy Consult for heparin  Indication: chest pain/ACS  No Known Allergies  Patient Measurements: Height: 6' 5 (195.6 cm) Weight: 118.4 kg (261 lb) IBW/kg (Calculated) : 89.1 HEPARIN  DW (KG): 113.5  Vital Signs: Temp: 98.7 F (37.1 C) (07/06 0511) Temp Source: Oral (07/06 0511) BP: 129/56 (07/06 0410) Pulse Rate: 57 (07/06 0410)  Labs: Recent Labs    07/17/23 2042 07/17/23 2344 07/18/23 0205 07/18/23 0342 07/18/23 0454  HGB 10.1*  --   --   --  9.5*  HCT 33.6*  --   --   --  30.8*  PLT 187  --   --   --  176  LABPROT 17.0*  --   --   --   --   INR 1.3*  --   --   --   --   HEPARINUNFRC  --   --   --   --  0.17*  CREATININE 3.20*  --   --  3.00*  --   TROPONINIHS 154* 259* 441* 545*  --     Estimated Creatinine Clearance: 26.6 mL/min (A) (by C-G formula based on SCr of 3 mg/dL (H)).   Medical History: Past Medical History:  Diagnosis Date   Anxiety    Arthritis    Depression    Diabetes mellitus without complication (HCC)    Hypertension    Pneumonia    Renal disorder    Assessment: 83 y/o male presenting with shortness of breath - found to have elevated troponin levels. PMH significant for anxiety, depression, diabetes, hypertension. Pharmacy has been consulted to initiate heparin  infusion. Per chart review, patient was not on anticoagulation prior to admission.  Baseline labs: hgb 10.1, plt 187, INR pending  Goal of Therapy:  Heparin  level 0.3-0.7 units/ml Monitor platelets by anticoagulation protocol: Yes   Plan:  7/6: HL @ 0454 = 0.17, SUBtherapeutic  - will order heparin  3400 units IV X 1 bolus and increase drip rate to 1850 units/hr - will recheck HL 8 hrs after rate change Thank you for involving pharmacy in this patient's care.   Oneika Simonian D Clinical Pharmacist 07/18/2023 5:49 AM

## 2023-07-18 NOTE — Progress Notes (Addendum)
 PHARMACY CONSULT NOTE - FOLLOW UP   Pharmacy Consult for Electrolyte Monitoring and Replacement    Recent Labs: Potassium 3.2 Scr 3.0     Assessment: a 83 y.o. 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 Pharmacy is asked to follow and replace electrolytes   Goal of Therapy:  Electrolytes WNL   Plan:  ---K 3.2  Potassium 40meq bid ordered by MD, will continue with original order and f/up in am  Dempsey Blush PharmD., BCPS Clinical Pharmacist 07/18/2023 8:47 AM

## 2023-07-18 NOTE — Consult Note (Signed)
 CARDIOLOGY CONSULT NOTE               Patient ID: Jeffery Joseph MRN: [id number] DOB/AGE: 1940-11-08 83 y.o.  Admit date: 07/17/2023 Referring Physician Dr Claretta Alderman Primary Physician Dr. Alm Needle Primary Cardiologist  Reason for Consultation shortness of breath heart failure elevated troponin  HPI: Patient 83 year old male end-stage renal disease predialysis diabetes hypertension peripheral vascular disease arthritis anxiety depression hyperkalemia BPH presented with acute shortness of breath has had difficulty with the last 3 days.  With shortness of breath lower extremity swelling edema and anasarca denies any abdominal symptoms no nausea vomiting fever chills or sweats.  Patient denies any previous cardiac history.  Patient denies any chest pain anginal symptoms  Review of systems complete and found to be negative unless listed above     Past Medical History:  Diagnosis Date   Anxiety    Arthritis    Depression    Diabetes mellitus without complication (HCC)    Hypertension    Pneumonia    Renal disorder     Past Surgical History:  Procedure Laterality Date   A/V FISTULAGRAM Left 06/08/2023   Procedure: A/V Fistulagram;  Surgeon: Jama Cordella MATSU, MD;  Location: [facility];  Service: Cardiovascular;  Laterality: Left;   APPLICATION OF WOUND VAC Right 11/14/2021   Procedure: APPLICATION OF WOUND VAC;  Surgeon: Jama Cordella MATSU, MD;  Location: [facility];  Service: Vascular;  Laterality: Right;   AV FISTULA PLACEMENT Left 11/14/2021   Procedure: ARTERIOVENOUS (AV) FISTULA CREATION ( BRACHIAL CEPHALIC);  Surgeon: Jama Cordella MATSU, MD;  Location: [facility] OR[facility];  Service: Vascular;  Laterality: Left;    (Not in a hospital admission)  Social History   Socioeconomic History   Marital status: Married    Spouse name: ,Loraine   Number of children: Not on file   Years of education: Not on file   Highest education level: Not on file  Occupational  History   Not on file  Tobacco Use   Smoking status: Never   Smokeless tobacco: Never  Substance and Sexual Activity   Alcohol use: No   Drug use: Not Currently   Sexual activity: Not Currently  Other Topics Concern   Not on file  Social History Narrative   Not on file   Social Drivers of Health   Financial Resource Strain: Low Risk  (11/25/2022)   Received from [facility]   Overall Financial Resource Strain (CARDIA)    Difficulty of Paying Living Expenses: Not hard at all  Food Insecurity: No Food Insecurity (11/25/2022)   Received from [facility]   Hunger Vital Sign    Within the past 12 months, you worried that your food would run out before you got the money to buy more.: Never true    Within the past 12 months, the food you bought just didn't last and you didn't have money to get more.: Never true  Transportation Needs: No Transportation Needs (11/25/2022)   Received from [facility] - Transportation    In the past 12 months, has lack of transportation kept you from medical appointments or from getting medications?: No    Lack of Transportation (Non-Medical): No  Physical Activity: Not on file  Stress: Not on file  Social Connections: Not on file  Intimate Partner Violence: Not on file    No family history on file.    Review of systems complete and  found to be negative unless listed above      PHYSICAL EXAM  General: Well developed, well nourished, in no acute distress HEENT:  Normocephalic and atramatic Neck:  No JVD.  Lungs: Clear bilaterally to auscultation and percussion. Heart: HRRR . Normal S1 and S2 without gallops or murmurs.  Abdomen: Bowel sounds are positive, abdomen soft and non-tender  Msk:  Back normal, normal gait. Normal strength and tone for age. Extremities: No clubbing, cyanosis or edema.   Neuro: Alert and oriented X 3. Psych:  Good affect, responds appropriately  Labs:    Lab Results  Component Value Date   WBC 6.7 07/18/2023   HGB 9.5 (L) 07/18/2023   HCT 30.8 (L) 07/18/2023   MCV 83.9 07/18/2023   PLT 176 07/18/2023    Recent Labs  Lab 07/17/23 2042 07/18/23 0342  NA 143 142  K 3.3* 3.2*  CL 110 109  CO2 23 23  BUN 40* 39*  CREATININE 3.20* 3.00*  CALCIUM 8.8* 8.4*  PROT 6.6  --   BILITOT 1.0  --   ALKPHOS 54  --   ALT 35  --   AST 23  --   GLUCOSE 122* 110*   No results found for: CKTOTAL, CKMB, CKMBINDEX, TROPONINI No results found for: CHOL No results found for: HDL No results found for: LDLCALC No results found for: TRIG No results found for: CHOLHDL No results found for: LDLDIRECT    Radiology: DG Chest Portable 1 View Result Date: 07/17/2023 CLINICAL DATA:  sob EXAM: PORTABLE CHEST 1 VIEW COMPARISON:  None Available. FINDINGS: Cardiac enlargement with some portion likely due to AP portable technique. Otherwise the heart and mediastinal contours are within normal limits. No focal consolidation. Pulmonary edema. No pleural effusion. No pneumothorax. No acute osseous abnormality. IMPRESSION: 1. Pulmonary edema. 2. Cardiac enlargement with some portion likely due to AP portable technique. 3. Consider repeat chest x-ray PA and lateral view for further evaluation. Electronically Signed   By: Morgane  Naveau M.D.   On: 07/17/2023 21:07   VAS US  DUPLEX DIALYSIS ACCESS (AVF, AVG) Result Date: 07/14/2023 DIALYSIS ACCESS Patient Name:  Jeffery Joseph  Date of Exam:   07/12/2023 Medical Rec #: [id number]      Accession #:    [id number] Date of Birth: 1940-12-08      Patient Gender: M Patient Age:   15 years Exam Location:  [facility] Procedure:      VAS US  DUPLEX DIALYSIS ACCESS (AVF, AVG) Referring Phys: CORDELLA SHAWL --------------------------------------------------------------------------------  Reason for Exam: Routine follow up. Access Site: Left Upper Extremity. Access Type: Brachial-cephalic AVF. History:  Created 11/2021          Stent placed at cephalic / subclavian confluence 06/08/2023. Performing Technologist: Donnice Charnley RVT  Examination Guidelines: A complete evaluation includes B-mode imaging, spectral Doppler, color Doppler, and power Doppler as needed of all accessible portions of each vessel. Unilateral testing is considered an integral part of a complete examination. Limited examinations for reoccurring indications may be performed as noted.  Findings: +--------------------+----------+-----------------+--------+ AVF                 PSV (cm/s)Flow Vol (mL/min)Comments +--------------------+----------+-----------------+--------+ Native artery inflow   234          2520                +--------------------+----------+-----------------+--------+ AVF Anastomosis        609                              +--------------------+----------+-----------------+--------+  +---------------+----------+-------------+----------+--------+  OUTFLOW VEIN   PSV (cm/s)Diameter (cm)Depth (cm)Describe +---------------+----------+-------------+----------+--------+ Subclavian vein    85                                    +---------------+----------+-------------+----------+--------+ Confluence        282        0.73                        +---------------+----------+-------------+----------+--------+ Clavicle          214        0.78                        +---------------+----------+-------------+----------+--------+ Shoulder          148        0.74                        +---------------+----------+-------------+----------+--------+ Prox UA           172        0.60                        +---------------+----------+-------------+----------+--------+ Mid UA            172        0.67                        +---------------+----------+-------------+----------+--------+ Dist UA           167        0.84                         +---------------+----------+-------------+----------+--------+ AC Fossa          706        0.90                        +---------------+----------+-------------+----------+--------+   Summary: Patent arteriovenous fistula. *See table(s) above for measurements and observations.  Diagnosing physician: Cordella Shawl MD Electronically signed by Cordella Shawl MD on 07/14/2023 at 3:14:43 PM.   --------------------------------------------------------------------------------   Final     EKG: Normal sinus rhythm multiple PVCs and PACs rate of 90  ASSESSMENT AND PLAN:  Elevated troponin Diabetes type 2 Hypertension End-stage renal disease Hyperkalemia Shortness of breath . Plan Continue current management proceed with nephrology management to control Agree with aggressive diuresis IV Continue aggressive blood pressure control Agree with diabetes management and control Mild obesity recommend modest weight loss exercise portion control Supplemental oxygen as necessary Consider pulmonary evaluation and input for possible COPD  Signed: Cara JONETTA Lovelace MD, 07/18/2023, 8:17 AM

## 2023-07-18 NOTE — Progress Notes (Addendum)
 At about 1800 CCMD notified me that the patients heart rate was staying at 127. Dr Maree and Dr Florencio. Patient was a red mews. New orders received from both doctors. Lactic acid and antibiotics ordered. EKG done. Dr Florencio came to the bedside. Troponins trending upward.  Metoprolol  ordered and given per MD request.

## 2023-07-18 NOTE — Progress Notes (Signed)
 PHARMACY - ANTICOAGULATION CONSULT NOTE  Pharmacy Consult for heparin  Indication: chest pain/ACS  No Known Allergies  Patient Measurements: Height: 6' 5 (195.6 cm) Weight: 118.4 kg (261 lb) IBW/kg (Calculated) : 89.1 HEPARIN  DW (KG): 113.5  Vital Signs: Temp: 98.8 F (37.1 C) (07/06 0924) Temp Source: Oral (07/06 0924) BP: 147/62 (07/06 1200) Pulse Rate: 56 (07/06 1200)  Labs: Recent Labs    07/17/23 2042 07/17/23 2344 07/18/23 0205 07/18/23 0342 07/18/23 0454 07/18/23 0852 07/18/23 1345  HGB 10.1*  --   --   --  9.5*  --   --   HCT 33.6*  --   --   --  30.8*  --   --   PLT 187  --   --   --  176  --   --   LABPROT 17.0*  --   --   --   --   --   --   INR 1.3*  --   --   --   --   --   --   HEPARINUNFRC  --   --   --   --  0.17*  --  0.41  CREATININE 3.20*  --   --  3.00*  --   --   --   TROPONINIHS 154*   < > 441* 545*  --  592*  --    < > = values in this interval not displayed.    Estimated Creatinine Clearance: 26.6 mL/min (A) (by C-G formula based on SCr of 3 mg/dL (H)).   Medical History: Past Medical History:  Diagnosis Date   Anxiety    Arthritis    Depression    Diabetes mellitus without complication (HCC)    Hypertension    Pneumonia    Renal disorder    Assessment: 83 y/o male presenting with shortness of breath - found to have elevated troponin levels. PMH significant for anxiety, depression, diabetes, hypertension. Pharmacy has been consulted to initiate heparin  infusion. Per chart review, patient was not on anticoagulation prior to admission.  Baseline labs: hgb 10.1, plt 187, INR pending  7/6: HL @ 0454 = 0.17, SUBtherapeutic  7/6 1345 HL 0.41,  therapeutic x 1   Goal of Therapy:  Heparin  level 0.3-0.7 units/ml Monitor platelets by anticoagulation protocol: Yes   Plan:  7/6 1345 HL 0.41,  therapeutic x 1 - will continue heparin  drip rate at 1850 units/hr - will check confirmatory HL in 8 hrs   Thank you for involving pharmacy in  this patient's care.   Emilene Roma A Clinical Pharmacist 07/18/2023 3:21 PM

## 2023-07-19 ENCOUNTER — Other Ambulatory Visit (HOSPITAL_COMMUNITY): Payer: Self-pay

## 2023-07-19 ENCOUNTER — Inpatient Hospital Stay

## 2023-07-19 DIAGNOSIS — Z7189 Other specified counseling: Secondary | ICD-10-CM

## 2023-07-19 DIAGNOSIS — J810 Acute pulmonary edema: Secondary | ICD-10-CM | POA: Diagnosis not present

## 2023-07-19 DIAGNOSIS — R7989 Other specified abnormal findings of blood chemistry: Secondary | ICD-10-CM | POA: Diagnosis not present

## 2023-07-19 DIAGNOSIS — Z515 Encounter for palliative care: Secondary | ICD-10-CM | POA: Diagnosis not present

## 2023-07-19 DIAGNOSIS — R0602 Shortness of breath: Secondary | ICD-10-CM | POA: Diagnosis not present

## 2023-07-19 DIAGNOSIS — E876 Hypokalemia: Secondary | ICD-10-CM | POA: Diagnosis not present

## 2023-07-19 DIAGNOSIS — I161 Hypertensive emergency: Secondary | ICD-10-CM | POA: Diagnosis not present

## 2023-07-19 LAB — CBC
HCT: 36 % — ABNORMAL LOW (ref 39.0–52.0)
Hemoglobin: 11.1 g/dL — ABNORMAL LOW (ref 13.0–17.0)
MCH: 25.7 pg — ABNORMAL LOW (ref 26.0–34.0)
MCHC: 30.8 g/dL (ref 30.0–36.0)
MCV: 83.3 fL (ref 80.0–100.0)
Platelets: 221 K/uL (ref 150–400)
RBC: 4.32 MIL/uL (ref 4.22–5.81)
RDW: 16.6 % — ABNORMAL HIGH (ref 11.5–15.5)
WBC: 8.1 K/uL (ref 4.0–10.5)
nRBC: 0 % (ref 0.0–0.2)

## 2023-07-19 LAB — BASIC METABOLIC PANEL WITH GFR
Anion gap: 13 (ref 5–15)
Anion gap: 14 (ref 5–15)
BUN: 42 mg/dL — ABNORMAL HIGH (ref 8–23)
BUN: 43 mg/dL — ABNORMAL HIGH (ref 8–23)
CO2: 26 mmol/L (ref 22–32)
CO2: 27 mmol/L (ref 22–32)
Calcium: 9.1 mg/dL (ref 8.9–10.3)
Calcium: 9.1 mg/dL (ref 8.9–10.3)
Chloride: 103 mmol/L (ref 98–111)
Chloride: 104 mmol/L (ref 98–111)
Creatinine, Ser: 3.26 mg/dL — ABNORMAL HIGH (ref 0.61–1.24)
Creatinine, Ser: 3.31 mg/dL — ABNORMAL HIGH (ref 0.61–1.24)
GFR, Estimated: 18 mL/min — ABNORMAL LOW (ref >60)
GFR, Estimated: 18 mL/min — ABNORMAL LOW (ref >60)
Glucose, Bld: 93 mg/dL (ref 70–99)
Glucose, Bld: 94 mg/dL (ref 70–99)
Potassium: 3.6 mmol/L (ref 3.5–5.1)
Potassium: 3.6 mmol/L (ref 3.5–5.1)
Sodium: 143 mmol/L (ref 135–145)
Sodium: 144 mmol/L (ref 135–145)

## 2023-07-19 LAB — HEPARIN LEVEL (UNFRACTIONATED)
Heparin Unfractionated: 0.5 [IU]/mL (ref 0.30–0.70)
Heparin Unfractionated: 0.32 [IU]/mL (ref 0.30–0.70)

## 2023-07-19 LAB — MAGNESIUM: Magnesium: 2 mg/dL (ref 1.7–2.4)

## 2023-07-19 LAB — BRAIN NATRIURETIC PEPTIDE: B Natriuretic Peptide: 2062.1 pg/mL — ABNORMAL HIGH (ref 0.0–100.0)

## 2023-07-19 LAB — GLUCOSE, CAPILLARY
Glucose-Capillary: 107 mg/dL — ABNORMAL HIGH (ref 70–99)
Glucose-Capillary: 137 mg/dL — ABNORMAL HIGH (ref 70–99)
Glucose-Capillary: 93 mg/dL (ref 70–99)
Glucose-Capillary: 95 mg/dL (ref 70–99)
Glucose-Capillary: 99 mg/dL (ref 70–99)

## 2023-07-19 LAB — HEPATITIS B CORE ANTIBODY, IGM: Hep B C IgM: NONREACTIVE

## 2023-07-19 LAB — HEPATITIS B SURFACE ANTIGEN: Hepatitis B Surface Ag: NONREACTIVE

## 2023-07-19 MED ORDER — AMIODARONE LOAD VIA INFUSION
150.0000 mg | Freq: Once | INTRAVENOUS | Status: AC
  Administered 2023-07-19: 150 mg via INTRAVENOUS
  Filled 2023-07-19: qty 83.34

## 2023-07-19 MED ORDER — POTASSIUM CHLORIDE CRYS ER 20 MEQ PO TBCR
40.0000 meq | EXTENDED_RELEASE_TABLET | Freq: Once | ORAL | Status: AC
  Administered 2023-07-19: 40 meq via ORAL
  Filled 2023-07-19: qty 2

## 2023-07-19 MED ORDER — AMIODARONE HCL IN DEXTROSE 360-4.14 MG/200ML-% IV SOLN
60.0000 mg/h | INTRAVENOUS | Status: DC
  Administered 2023-07-19 (×2): 60 mg/h via INTRAVENOUS
  Filled 2023-07-19 (×2): qty 200

## 2023-07-19 MED ORDER — ALPRAZOLAM 0.5 MG PO TABS
0.5000 mg | ORAL_TABLET | Freq: Two times a day (BID) | ORAL | Status: DC
  Administered 2023-07-19 – 2023-07-23 (×9): 0.5 mg via ORAL
  Filled 2023-07-19 (×10): qty 1

## 2023-07-19 MED ORDER — AMIODARONE HCL 200 MG PO TABS
200.0000 mg | ORAL_TABLET | Freq: Two times a day (BID) | ORAL | Status: DC
  Administered 2023-07-19: 200 mg via ORAL
  Filled 2023-07-19: qty 1

## 2023-07-19 MED ORDER — SENNOSIDES-DOCUSATE SODIUM 8.6-50 MG PO TABS
2.0000 | ORAL_TABLET | Freq: Two times a day (BID) | ORAL | Status: DC
  Administered 2023-07-19 (×2): 2 via ORAL
  Filled 2023-07-19 (×2): qty 2

## 2023-07-19 MED ORDER — POTASSIUM CHLORIDE CRYS ER 20 MEQ PO TBCR: 40.0000 meq | EXTENDED_RELEASE_TABLET | ORAL | Status: DC

## 2023-07-19 MED ORDER — AMIODARONE HCL IN DEXTROSE 360-4.14 MG/200ML-% IV SOLN
30.0000 mg/h | INTRAVENOUS | Status: DC
  Filled 2023-07-19: qty 200

## 2023-07-19 MED ORDER — POLYETHYLENE GLYCOL 3350 17 G PO PACK
17.0000 g | PACK | Freq: Two times a day (BID) | ORAL | Status: DC
  Administered 2023-07-19 (×2): 17 g via ORAL
  Filled 2023-07-19 (×2): qty 1

## 2023-07-19 MED ORDER — METOPROLOL TARTRATE 25 MG PO TABS
12.5000 mg | ORAL_TABLET | Freq: Three times a day (TID) | ORAL | Status: DC
  Administered 2023-07-19 – 2023-07-21 (×5): 12.5 mg via ORAL
  Filled 2023-07-19 (×6): qty 1

## 2023-07-19 NOTE — Progress Notes (Signed)
 [facility]  ROUNDING NOTE   Subjective:   [name] was admitted to [facility] on 07/17/2023 for Acute pulmonary edema (HCC) [J81.0] SOB (shortness of breath) [R06.02] Elevated troponin [R79.89]  Patient was having 3 to 4 days of progressive shortness of breath and peripheral edema.   Last seen by Nephrology on 5/7 by Dr. Marcelino.   Update Sitting up in bed Completed breakfast tray at bedside Denies shortness of breath, room air   Objective:  Vital signs in last 24 hours:  Temp:  [98.4 F (36.9 C)-100.5 F (38.1 C)] 98.4 F (36.9 C) (07/07 0805) Pulse Rate:  [59-132] 59 (07/07 0805) Resp:  [15-28] 18 (07/07 0805) BP: (137-156)/(56-72) 141/56 (07/07 0805) SpO2:  [94 %-100 %] 96 % (07/07 0805) Weight:  [125.6 kg] 125.6 kg (07/07 0556)  Weight change: 7.211 kg Filed Weights   07/17/23 2028 07/19/23 0556  Weight: 118.4 kg 125.6 kg    Intake/Output: I/O last 3 completed shifts: In: 1180.1 [P.O.:200; I.V.:630.1; IV Piggyback:350] Out: 6525 [Urine:6525]   Intake/Output this shift:  Total I/O In: 119.2 [I.V.:119.2] Out: 950 [Urine:950]  Physical Exam: General: NAD, sitting up in bed  Head: Normocephalic, atraumatic. Moist oral mucosal membranes  Eyes: Anicteric  Lungs:  Bilateral crackles at bases  Heart: Regular rate and rhythm  Abdomen:  Soft, nontender, +abdominal wall edema  Extremities:  2+ peripheral edema  Neurologic: Alert, awake  Skin: No lesions  Access: Left AVF +thrill +bruit    Basic Metabolic Panel: Recent Labs  Lab 07/17/23 2042 07/18/23 0342 07/19/23 0748  NA 143 142 144  143  K 3.3* 3.2* 3.6  3.6  CL 110 109 104  103  CO2 23 23 27  26   GLUCOSE 122* 110* 94  93  BUN 40* 39* 43*  42*  CREATININE 3.20* 3.00* 3.26*  3.31*  CALCIUM 8.8* 8.4* 9.1  9.1  MG  --   --  2.0  PHOS  --  2.9  --     Liver Function Tests: Recent Labs  Lab 07/17/23 2042 07/18/23 0342  AST 23  --   ALT 35  --   ALKPHOS 54  --    BILITOT 1.0  --   PROT 6.6  --   ALBUMIN 3.2* 2.9*   No results for input(s): LIPASE, AMYLASE in the last 168 hours. No results for input(s): AMMONIA in the last 168 hours.  CBC: Recent Labs  Lab 07/17/23 2042 07/18/23 0454 07/19/23 0748  WBC 7.5 6.7 8.1  HGB 10.1* 9.5* 11.1*  HCT 33.6* 30.8* 36.0*  MCV 85.1 83.9 83.3  PLT 187 176 221    Cardiac Enzymes: No results for input(s): CKTOTAL, CKMB, CKMBINDEX, TROPONINI in the last 168 hours.  BNP: Invalid input(s): POCBNP  CBG: Recent Labs  Lab 07/18/23 1131 07/18/23 1717 07/19/23 0055 07/19/23 0807 07/19/23 1148  GLUCAP 86 85 95 93 99    Microbiology: Results for orders placed or performed during the hospital encounter of 07/17/23  Resp panel by RT-PCR (RSV, Flu A&B, Covid) Anterior Nasal Swab     Status: None   Collection Time: 07/17/23  8:42 PM   Specimen: Anterior Nasal Swab  Result Value Ref Range Status   SARS Coronavirus 2 by RT PCR NEGATIVE NEGATIVE Final    Comment: (NOTE) SARS-CoV-2 target nucleic acids are NOT DETECTED.  The SARS-CoV-2 RNA is generally detectable in upper respiratory specimens during the acute phase of infection. The lowest concentration of SARS-CoV-2 viral copies this  assay can detect is 138 copies/mL. A negative result does not preclude SARS-Cov-2 infection and should not be used as the sole basis for treatment or other patient management decisions. A negative result may occur with  improper specimen collection/handling, submission of specimen other than nasopharyngeal swab, presence of viral mutation(s) within the areas targeted by this assay, and inadequate number of viral copies(<138 copies/mL). A negative result must be combined with clinical observations, patient history, and epidemiological information. The expected result is Negative.  Fact Sheet for Patients:  BloggerCourse.com  Fact Sheet for Healthcare Providers:   SeriousBroker.it  This test is no t yet approved or cleared by the [place]  FDA and  has been authorized for detection and/or diagnosis of SARS-CoV-2 by FDA under an Emergency Use Authorization (EUA). This EUA will remain  in effect (meaning this test can be used) for the duration of the COVID-19 declaration under Section 564(b)(1) of the Act, 21 U.S.C.section 360bbb-3(b)(1), unless the authorization is terminated  or revoked sooner.       Influenza A by PCR NEGATIVE NEGATIVE Final   Influenza B by PCR NEGATIVE NEGATIVE Final    Comment: (NOTE) The Xpert Xpress SARS-CoV-2/FLU/RSV plus assay is intended as an aid in the diagnosis of influenza from Nasopharyngeal swab specimens and should not be used as a sole basis for treatment. Nasal washings and aspirates are unacceptable for Xpert Xpress SARS-CoV-2/FLU/RSV testing.  Fact Sheet for Patients: BloggerCourse.com  Fact Sheet for Healthcare Providers: SeriousBroker.it  This test is not yet approved or cleared by the [place]  FDA and has been authorized for detection and/or diagnosis of SARS-CoV-2 by FDA under an Emergency Use Authorization (EUA). This EUA will remain in effect (meaning this test can be used) for the duration of the COVID-19 declaration under Section 564(b)(1) of the Act, 21 U.S.C. section 360bbb-3(b)(1), unless the authorization is terminated or revoked.     Resp Syncytial Virus by PCR NEGATIVE NEGATIVE Final    Comment: (NOTE) Fact Sheet for Patients: BloggerCourse.com  Fact Sheet for Healthcare Providers: SeriousBroker.it  This test is not yet approved or cleared by the [place]  FDA and has been authorized for detection and/or diagnosis of SARS-CoV-2 by FDA under an Emergency Use Authorization (EUA). This EUA will remain in effect (meaning this test can be used) for  the duration of the COVID-19 declaration under Section 564(b)(1) of the Act, 21 U.S.C. section 360bbb-3(b)(1), unless the authorization is terminated or revoked.  Performed at [facility], [address] [place], [place] [address]   Blood culture (routine x 2)     Status: None (Preliminary result)   Collection Time: 07/17/23  8:42 PM   Specimen: BLOOD  Result Value Ref Range Status   Specimen Description BLOOD RIGHT FOREARM  Final   Special Requests   Final    BOTTLES DRAWN AEROBIC AND ANAEROBIC Blood Culture adequate volume   Culture   Final    NO GROWTH 2 DAYS Performed at [facility], [address] [place], [place] [address]    Report Status PENDING  Incomplete  Blood culture (routine x 2)     Status: None (Preliminary result)   Collection Time: 07/17/23 10:33 PM   Specimen: BLOOD  Result Value Ref Range Status   Specimen Description BLOOD RIGHT ASSIST CONTROL  Final   Special Requests   Final    BOTTLES DRAWN AEROBIC AND ANAEROBIC Blood Culture results may not be optimal due to an inadequate volume of blood received in culture  bottles   Culture   Final    NO GROWTH 2 DAYS Performed at [facility], [address] [place], [place] [address]    Report Status PENDING  Incomplete    Coagulation Studies: Recent Labs    07/17/23 08-23-40  LABPROT 17.0*  INR 1.3*    Urinalysis: Recent Labs    07/17/23 2012  COLORURINE YELLOW*  LABSPEC 1.012  PHURINE 6.0  GLUCOSEU NEGATIVE  HGBUR SMALL*  BILIRUBINUR NEGATIVE  KETONESUR NEGATIVE  PROTEINUR 100*  NITRITE NEGATIVE  LEUKOCYTESUR NEGATIVE      Imaging: DG Chest 2 View Result Date: 07/19/2023 CLINICAL DATA:  [id number] Dyspnea [id number] EXAM: CHEST - 2 VIEW COMPARISON:  July 18, 2023 FINDINGS: Lower lung volumes. Bilateral perihilar interstitial opacities throughout both lungs. Hazy airspace opacities within the lung bases with blunting of the costophrenic sulci. No pneumothorax. Mild  cardiomegaly. Tortuous aorta with aortic atherosclerosis. No acute fracture or destructive lesions. Multilevel thoracic osteophytosis. IMPRESSION: Lower lung volumes. Redemonstrated cardiomegaly with findings of pulmonary edema. Increasing hazy airspace opacities within the lung bases, possibly due to pleural effusions and atelectasis or worsening edema. Electronically Signed   By: Rogelia Myers M.D.   On: 07/19/2023 08:22   DG Chest Port 1 View Result Date: 07/18/2023 CLINICAL DATA:  Dyspnea EXAM: PORTABLE CHEST 1 VIEW COMPARISON:  07/17/2023 FINDINGS: Cardiomegaly with vascular congestion and mild diffuse interstitial and ground-glass opacity, improved compared with 07/17/2023. Small bilateral effusions. No pneumothorax. Stent in the left axillary region IMPRESSION: Cardiomegaly with vascular congestion and edema, improved compared with 07/17/2023. Small bilateral effusions. Electronically Signed   By: Luke Bun M.D.   On: 07/18/2023 20:14   ECHOCARDIOGRAM COMPLETE Result Date: 07/18/2023    ECHOCARDIOGRAM REPORT   Patient Name:   Jeffery Joseph Date of Exam: 07/18/2023 Medical Rec #:  [id number]     Height:       77.0 in Accession #:    [id number]    Weight:       261.0 lb Date of Birth:  08/06/1940     BSA:          2.505 m Patient Age:    82 years      BP:           157/58 mmHg Patient Gender: M             HR:           63 bpm. Exam Location:  [facility] Procedure: 2D Echo, Cardiac Doppler and Color Doppler (Both Spectral and Color            Flow Doppler were utilized during procedure). Indications:     Dyspnea R06.00  History:         Patient has no prior history of Echocardiogram examinations.  Sonographer:     Thedora Louder RDCS, FASE Referring Phys:  [id number] CLARETTA HERO AMPONSAH Diagnosing Phys: Cara JONETTA Lovelace MD  Sonographer Comments: This study was performed with the patient positioned supine. IMPRESSIONS  1. Left ventricular ejection fraction, by estimation, is 55 to 60%. The left ventricle has  normal function. The left ventricle has no regional wall motion abnormalities. The left ventricular internal cavity size was mildly dilated. There is moderate concentric left ventricular hypertrophy. Left ventricular diastolic parameters are consistent with Grade III diastolic dysfunction (restrictive).  2. Right ventricular systolic function is normal. The right ventricular size is normal.  3. Left atrial size was moderately dilated.  4. Right atrial size was mild to  moderately dilated.  5. The mitral valve is grossly normal. Trivial mitral valve regurgitation.  6. Tricuspid valve regurgitation is mild to moderate.  7. The aortic valve is grossly normal. Aortic valve regurgitation is mild. Mild aortic valve stenosis. FINDINGS  Left Ventricle: Left ventricular ejection fraction, by estimation, is 55 to 60%. The left ventricle has normal function. The left ventricle has no regional wall motion abnormalities. Strain was performed and the global longitudinal strain is indeterminate. Global longitudinal strain performed but not reported based on interpreter judgement due to suboptimal tracking. The left ventricular internal cavity size was mildly dilated. There is moderate concentric left ventricular hypertrophy. Left ventricular diastolic parameters are consistent with Grade III diastolic dysfunction (restrictive). Right Ventricle: The right ventricular size is normal. No increase in right ventricular wall thickness. Right ventricular systolic function is normal. Left Atrium: Left atrial size was moderately dilated. Right Atrium: Right atrial size was mild to moderately dilated. Pericardium: There is no evidence of pericardial effusion. Mitral Valve: The mitral valve is grossly normal. Trivial mitral valve regurgitation. Tricuspid Valve: The tricuspid valve is normal in structure. Tricuspid valve regurgitation is mild to moderate. Aortic Valve: The aortic valve is grossly normal. Aortic valve regurgitation is mild.  Aortic regurgitation PHT measures 505 msec. Mild aortic stenosis is present. Aortic valve mean gradient measures 20.8 mmHg. Aortic valve peak gradient measures 41.7 mmHg. Aortic valve area, by VTI measures 1.61 cm. Pulmonic Valve: The pulmonic valve was normal in structure. Pulmonic valve regurgitation is not visualized. Aorta: The ascending aorta was not well visualized. IAS/Shunts: No atrial level shunt detected by color flow Doppler. Additional Comments: 3D was performed not requiring image post processing on an independent workstation and was indeterminate.  LEFT VENTRICLE PLAX 2D LVIDd:         5.50 cm   Diastology LVIDs:         3.90 cm   LV e' medial:    8.16 cm/s LV PW:         1.50 cm   LV E/e' medial:  19.9 LV IVS:        1.60 cm   LV e' lateral:   12.50 cm/s LVOT diam:     2.10 cm   LV E/e' lateral: 13.0 LV SV:         116 LV SV Index:   46 LVOT Area:     3.46 cm  RIGHT VENTRICLE RV Basal diam:  3.60 cm RV S prime:     14.30 cm/s TAPSE (M-mode): 2.6 cm LEFT ATRIUM             Index        RIGHT ATRIUM           Index LA diam:        5.00 cm 2.00 cm/m   RA Area:     25.20 cm LA Vol (A2C):   93.0 ml 37.13 ml/m  RA Volume:   79.90 ml  31.90 ml/m LA Vol (A4C):   89.0 ml 35.53 ml/m LA Biplane Vol: 94.0 ml 37.53 ml/m  AORTIC VALVE                     PULMONIC VALVE AV Area (Vmax):    1.58 cm      PV Vmax:          1.33 m/s AV Area (Vmean):   1.57 cm      PV Peak grad:     7.1 mmHg AV  Area (VTI):     1.61 cm      PR End Diast Vel: 11.42 msec AV Vmax:           323.00 cm/s   RVOT Peak grad:   7 mmHg AV Vmean:          211.000 cm/s AV VTI:            0.725 m AV Peak Grad:      41.7 mmHg AV Mean Grad:      20.8 mmHg LVOT Vmax:         147.00 cm/s LVOT Vmean:        95.400 cm/s LVOT VTI:          0.336 m LVOT/AV VTI ratio: 0.46 AI PHT:            505 msec  AORTA Ao Root diam: 3.30 cm Ao Asc diam:  2.60 cm MITRAL VALVE                TRICUSPID VALVE MV Area (PHT): 3.42 cm     TR Peak grad:   59.0 mmHg MV  Decel Time: 222 msec     TR Vmax:        384.00 cm/s MR Peak grad: 168.5 mmHg MR Mean grad: 117.0 mmHg    SHUNTS MR Vmax:      649.00 cm/s   Systemic VTI:  0.34 m MR Vmean:     513.0 cm/s    Systemic Diam: 2.10 cm MV E velocity: 162.00 cm/s MV A velocity: 73.90 cm/s MV E/A ratio:  2.19 Jeffery D Callwood MD Electronically signed by Cara JONETTA Lovelace MD Signature Date/Time: 07/18/2023/8:39:21 AM    Final    DG Chest Portable 1 View Result Date: 07/17/2023 CLINICAL DATA:  sob EXAM: PORTABLE CHEST 1 VIEW COMPARISON:  None Available. FINDINGS: Cardiac enlargement with some portion likely due to AP portable technique. Otherwise the heart and mediastinal contours are within normal limits. No focal consolidation. Pulmonary edema. No pleural effusion. No pneumothorax. No acute osseous abnormality. IMPRESSION: 1. Pulmonary edema. 2. Cardiac enlargement with some portion likely due to AP portable technique. 3. Consider repeat chest x-ray PA and lateral view for further evaluation. Electronically Signed   By: Morgane  Naveau M.D.   On: 07/17/2023 21:07     Medications:    amiodarone      Followed by   amiodarone      azithromycin  (ZITHROMAX ) 500 mg in sodium chloride  0.9 % 250 mL IVPB 500 mg (07/18/23 2156)   cefTRIAXone  (ROCEPHIN )  IV 1 g (07/18/23 2056)   heparin  2,050 Units/hr (07/19/23 1057)    amiodarone   150 mg Intravenous Once   amLODipine   10 mg Oral Daily   aspirin  EC  81 mg Oral Daily   cholecalciferol   5,000 Units Oral Daily   clopidogrel   75 mg Oral Daily   donepezil   10 mg Oral QHS   enalapril   20 mg Oral BID   finasteride   5 mg Oral Daily   furosemide   40 mg Intravenous Q8H   glipiZIDE   5 mg Oral Q breakfast   hydrALAZINE   100 mg Oral TID   insulin  aspart  0-6 Units Subcutaneous TID WC   metoprolol  tartrate  50 mg Oral TID   polyethylene glycol  17 g Oral BID   senna-docusate  2 tablet Oral BID   sodium bicarbonate   650 mg Oral BID   spironolactone   12.5 mg Oral Daily   tamsulosin   0.4  mg Oral Daily   acetaminophen  **OR** acetaminophen , ALPRAZolam , ondansetron  **OR** ondansetron  (ZOFRAN ) IV  Assessment/ Plan:  Mr. ALIJA RIANO is a 83 y.o.  male with chronic kidney disease stage IV, hypertension, diabetes mellitus type II, BPH, peripheral arterial disease, and diastolic congestive heart failure who presents on 07/17/2023 for Acute pulmonary edema (HCC) [J81.0] SOB (shortness of breath) [R06.02] Elevated troponin [R79.89]  Chronic Kidney Disease stage IV with proteinuria: baseline creatinine 3.44, GFR of 17.  - UOP 3.5L Responding to IV furosemide .  - continue enalapril .  - continue sodium bicarbonate   Acute exacerbation of chronic diastolic congestive heart failure -Continue IV furosemide  - Spironolactone  25mg  daily  Hypertension with chronic kidney disease - continue IV furosemide  with Spironolactone  - continue enalapril , hydralazine , metoprolol , tamsulosin , and amlodipine   Diabetes mellitus type II with chronic kidney disease: hemoglobin A1c of 5.2% -glucose well controlled.   Hypokalemia: with history of hyperkalemia - Oral potassium chloride  supplementation  - Spironolactone    LOS: 2 Izekiel Flegel 7/7/20252:14 PM

## 2023-07-19 NOTE — Progress Notes (Signed)
 Patient transferred to room 247. Report given

## 2023-07-19 NOTE — Evaluation (Signed)
 Physical Therapy Evaluation Patient Details Name: Jeffery Joseph MRN: [id number] DOB: 11-22-1940 Today's Date: 07/19/2023  History of Present Illness  83 y/o male presented to ED on 07/17/23 for SOB. Admitted for acute pulmonary edema. PMH: ESRD, HTN, T2DM, PAD, anxiety, depression, dementia  Clinical Impression  Patient admitted with the above. PTA, patient lives with wife and daughter and was modI with use of SPC at baseline. Patient presents with weakness, impaired balance, and decreased activity tolerance. Required supervision for bed mobility and minA to stand from low bed surface after 3rd attempt. Ambulated ~50' with RW and minA-CGA with frequent cueing for close proximity to RW. VSS on RA throughout session. Patient will benefit from skilled PT services during acute stay to address listed deficits. Patient will benefit from ongoing therapy at discharge to maximize functional independence and safety.         If plan is discharge home, recommend the following: A little help with walking and/or transfers;A little help with bathing/dressing/bathroom;Assistance with cooking/housework;Direct supervision/assist for medications management;Direct supervision/assist for financial management;Assist for transportation;Help with stairs or ramp for entrance   Can travel by private vehicle        Equipment Recommendations Rolling Jean Skow (2 wheels)  Recommendations for Other Services       Functional Status Assessment Patient has had a recent decline in their functional status and demonstrates the ability to make significant improvements in function in a reasonable and predictable amount of time.     Precautions / Restrictions Precautions Precautions: Fall Recall of Precautions/Restrictions: Intact Restrictions Weight Bearing Restrictions Per Provider Order: No      Mobility  Bed Mobility Overal bed mobility: Needs Assistance Bed Mobility: Supine to Sit, Sit to Supine     Supine to sit:  Supervision Sit to supine: Supervision        Transfers Overall transfer level: Needs assistance Equipment used: Rolling Erle Guster (2 wheels) Transfers: Sit to/from Stand Sit to Stand: Min assist           General transfer comment: attempted x 2 with posterior LOB back onto bed. On 3rd attempt, able to stand with minA for steadying    Ambulation/Gait Ambulation/Gait assistance: Min assist, Contact guard assist Gait Distance (Feet): 50 Feet Assistive device: Rolling Herson Prichard (2 wheels) Gait Pattern/deviations: Step-through pattern, Decreased stride length, Trunk flexed Gait velocity: decreased     General Gait Details: assist for RW management and balance. Progressing to CGA for safety. Frequent cueing for maintaining close proximity to RW during ambulation  Stairs            Wheelchair Mobility     Tilt Bed    Modified Rankin (Stroke Patients Only)       Balance Overall balance assessment: Needs assistance Sitting-balance support: No upper extremity supported, Feet supported Sitting balance-Leahy Scale: Normal     Standing balance support: Bilateral upper extremity supported, During functional activity Standing balance-Leahy Scale: Fair                               Pertinent Vitals/Pain Pain Assessment Pain Assessment: Faces Faces Pain Scale: Hurts little more Pain Location: B feet Pain Descriptors / Indicators: Sore Pain Intervention(s): Monitored during session    Home Living Family/patient expects to be discharged to:: Private residence Living Arrangements: Spouse/significant other;Children Available Help at Discharge: Family;Available 24 hours/day Type of Home: House Home Access: Stairs to enter   [employer] of Steps: 1   Home  Layout: One level Home Equipment: Agricultural consultant (2 wheels);Cane - single point      Prior Function Prior Level of Function : Independent/Modified Independent             Mobility  Comments: uses SPC for mobility       Extremity/Trunk Assessment   Upper Extremity Assessment Upper Extremity Assessment: Defer to OT evaluation    Lower Extremity Assessment Lower Extremity Assessment: Generalized weakness    Cervical / Trunk Assessment Cervical / Trunk Assessment: Kyphotic  Communication   Communication Communication: No apparent difficulties    Cognition Arousal: Alert Behavior During Therapy: WFL for tasks assessed/performed   PT - Cognitive impairments: No family/caregiver present to determine baseline                         Following commands: Intact       Cueing       General Comments General comments (skin integrity, edema, etc.): VSS on RA    Exercises     Assessment/Plan    PT Assessment Patient needs continued PT services  PT Problem List Decreased strength;Decreased activity tolerance;Decreased balance;Decreased mobility;Decreased safety awareness;Decreased knowledge of precautions;Decreased knowledge of use of DME;Cardiopulmonary status limiting activity       PT Treatment Interventions DME instruction;Gait training;Therapeutic exercise;Functional mobility training;Therapeutic activities;Balance training;Stair training;Patient/family education    PT Goals (Current goals can be found in the Care Plan section)  Acute Rehab PT Goals Patient Stated Goal: to reduce pain in feet PT Goal Formulation: With patient Time For Goal Achievement: 08/02/23 Potential to Achieve Goals: Fair    Frequency Min 2X/week     Co-evaluation               AM-PAC PT 6 Clicks Mobility  Outcome Measure Help needed turning from your back to your side while in a flat bed without using bedrails?: A Little Help needed moving from lying on your back to sitting on the side of a flat bed without using bedrails?: A Little Help needed moving to and from a bed to a chair (including a wheelchair)?: A Little Help needed standing up from a  chair using your arms (e.g., wheelchair or bedside chair)?: A Little Help needed to walk in hospital room?: A Little Help needed climbing 3-5 steps with a railing? : A Little 6 Click Score: 18    End of Session   Activity Tolerance: Patient tolerated treatment well Patient left: in bed;with call bell/phone within reach;with bed alarm set Nurse Communication: Mobility status PT Visit Diagnosis: Unsteadiness on feet (R26.81);Muscle weakness (generalized) (M62.81)    Time: 8695-8663 PT Time Calculation (min) (ACUTE ONLY): 32 min   Charges:   PT Evaluation $PT Eval Moderate Complexity: 1 Mod PT Treatments $Therapeutic Activity: 8-22 mins PT General Charges $$ ACUTE PT VISIT: 1 Visit         Maryanne Finder, PT, DPT Physical Therapist - [facility]  [facility]   Sanye Ledesma A Gustavo Dispenza 07/19/2023, 2:27 PM

## 2023-07-19 NOTE — Care Management Important Message (Signed)
 Important Message  Patient Details  Name: Jeffery Joseph MRN: [id number] Date of Birth: 03-12-1940   Important Message Given:  Yes - Medicare IM     [name] 07/19/2023, 2:40 PM

## 2023-07-19 NOTE — Progress Notes (Signed)
 PHARMACY - ANTICOAGULATION CONSULT NOTE  Pharmacy Consult for heparin  Indication: chest pain/ACS  No Known Allergies  Patient Measurements: Height: 6' 5 (195.6 cm) Weight: 125.6 kg (276 lb 14.4 oz) IBW/kg (Calculated) : 89.1 HEPARIN  DW (KG): 113.5  Vital Signs: Temp: 98.4 F (36.9 C) (07/07 0805) Temp Source: Oral (07/07 0437) BP: 141/56 (07/07 0805) Pulse Rate: 59 (07/07 0805)  Labs: Recent Labs    07/17/23 2042 07/17/23 2042 07/17/23 2344 07/18/23 0342 07/18/23 0454 07/18/23 0852 07/18/23 1345 07/18/23 1630 07/18/23 1735 07/18/23 2050 07/18/23 2200 07/19/23 0748  HGB 10.1*  --   --   --  9.5*  --   --   --   --   --   --  11.1*  HCT 33.6*  --   --   --  30.8*  --   --   --   --   --   --  36.0*  PLT 187  --   --   --  176  --   --   --   --   --   --  221  LABPROT 17.0*  --   --   --   --   --   --   --   --   --   --   --   INR 1.3*  --   --   --   --   --   --   --   --   --   --   --   HEPARINUNFRC  --    < >  --   --  0.17*  --  0.41  --   --   --  0.25* 0.50  CREATININE 3.20*  --   --  3.00*  --   --   --   --   --   --   --  3.26*  3.31*  TROPONINIHS 154*  --    < > 545*  --    < >  --  555* 605* 547*  --   --    < > = values in this interval not displayed.    Estimated Creatinine Clearance: 24.8 mL/min (A) (by C-G formula based on SCr of 3.31 mg/dL (H)).   Medical History: Past Medical History:  Diagnosis Date   Anxiety    Arthritis    Depression    Diabetes mellitus without complication (HCC)    Hypertension    Pneumonia    Renal disorder    Assessment: 83 y/o male presenting with shortness of breath - found to have elevated troponin levels. PMH significant for anxiety, depression, diabetes, hypertension. Pharmacy has been consulted to initiate heparin  infusion. Per chart review, patient was not on anticoagulation prior to admission.  Baseline labs: hgb 10.1, plt 187, INR pending  7/6: HL @ 0454 = 0.17, SUBtherapeutic  7/6 1345 HL 0.41,   therapeutic x 1 7/6 2200 HL 0.25,  SUBtherapeutic  7/7 0748 HL 0.50, therapeutic x 1   Goal of Therapy:  Heparin  level 0.3-0.7 units/ml Monitor platelets by anticoagulation protocol: Yes   Plan:  - Continue heparin  drip rate at 2050 units/hr - Check confirmatory HL in 8 hrs - CBC/H&H daily  Thank you for involving pharmacy in this patient's care.   Alexsandria Kivett A Jenkins Risdon Clinical Pharmacist 07/19/2023 10:45 AM

## 2023-07-19 NOTE — Consult Note (Addendum)
 Consultation Note Date: 07/19/2023 at 1000  Patient Name: Jeffery Joseph  DOB: 1940/04/10  MRN: [id number]  Age / Sex: 83 y.o., male  PCP: Jeffery Alm SQUIBB, MD Referring Physician: Maree Hue, MD  HPI/Patient Profile: 83 y.o. 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  He presented to ED 07/17/2023 c/o SOB x 2 to 3 days with increased lower extremity edema.  Upon arrival to the ED patient was found to have low-grade fever of 100.3 and mildly tachypneic.  ED labs significant for sodium 143, K+ 3.3, BUN/creatinine 40/3.20, BNP 2410, troponin 154 => 259, lactic 1.2, WBC 7.5, Hgb 10.1, platelet 187.  COVID/flu/RSV negative. UA negative.  EKG sinus rhythm with RBBB, LVH and multiple PVC s. CXR showed cardiomegaly and pulmonary edema. BP 176/71, HR 77, RR 22, SpO2 94% RA and 100.3.  Patient was admitted to [facility] for management of acute pulmonary edema, hypertensive emergency and elevated troponin.  Palliative team was consulted to assist with goals of care conversations.  Clinical Assessment and Goals of Care:  Elderly male sitting upright in bed.  He is alert and oriented to self, DOB, time, location and situation.  He is able to engage in meaningful conversation and requests phone call to his daughter, Jeffery Joseph.  Even, unlabored respirations.  He is in no distress.  Denies pain.  Reports good appetite and eating all meals.  States slept well overnight.  Denies chest pain or shortness of breath.  Patient reports he may be able to go home tomorrow.  Extensive chart review completed prior to meeting patient including labs, vital signs, imaging, progress notes, orders, and available advanced directive documents from current and previous encounters. I then met with patient at bedside to discuss diagnosis prognosis, GOC, EOL  wishes, disposition and options.  Patient request to call daughter Jeffery Joseph to discuss further goals of care.  I introduced Palliative Medicine as specialized medical care for people living with serious illness. It focuses on providing relief from the symptoms and stress of a serious illness. The goal is to improve quality of life for both the patient and the family.  We discussed a brief life review of the patient.  Jeffery Joseph shares that he has been married to Jeffery Joseph more than 60 years.  They have 4 daughters and 1 son that is deceased.  They have multiple grandchildren.  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  As far as functional and nutritional status patient reports he is able to perform all of his ADLs himself.  He uses a cane to assist with ambulation.  Patient's wife cooks meals.  Patient reports very good appetite.  Jeffery Joseph shares he no longer drives and does not really do much around the house anymore but sit on the front porch.  He states he is no longer allowed to go into his workshop as family is fearful of  falls.  Patient shares in the event he were to become confused and unable to make medical decisions for himself, Jeffery Joseph, daughter-H POA, makes all of his decisions.  I attempted to elicit values and goals of care important to the patient.  Patient wants to go home.  Jeffery Joseph was contacted by phone to discuss goals of care for her father.  Daughter shares that she has provided living will that states her father's wishes multiple times since his admit.  She was advised they are not in the chart at this time.  She will provide another copy to be placed in patient's physical chart and scanned into system under ACP documentation.  We discussed patient's current illness and what it means in the larger context of patient's on-going co-morbidities.  Natural disease trajectory and expectations at EOL were discussed. Jeffery Joseph understands that her father  has nonreversible and progressive illnesses that may continue to affect his quality of life.  The difference between aggressive medical intervention and comfort care was considered in light of the patient's goals of care.   Advance directives, concepts specific to code status, artificial feeding and hydration, and rehospitalization were considered and discussed. Discussed CODE STATUS to which daughter states she cannot remember what living will says but does remember her father not wanting a feeding tube or a trach.  She was advised at this time her father is a full code and in the event of a cardiac arrest he would receive CPR and be placed on a ventilator.  Although she endorses she does not think this is what her father would want, she does not want to change status and request that staff reads living will for wishes and and into chart for later reference.  Education offered regarding concept specific to human mortality and the limitations of medical interventions to prolong life when the body begins to fail to thrive.  Family is facing treatment option decisions, advanced directive, and anticipatory care needs.  Jeffery Joseph, Daughter is the assigned H POA for Jeffery Joseph.   Discussed with patient/family the importance of continued conversation with family and the medical providers regarding overall plan of care and treatment options, ensuring decisions are within the context of the patient's values and GOCs.    Hospice and Palliative Care services outpatient were explained and offered.  Daughter is interested in outpatient palliative following her father at discharge.  Referral made to [facility] liaison for outpatient palliative services to follow at discharge.  Questions and concerns were addressed. The family was encouraged to call with questions or concerns.   Primary Decision Maker HCPOA- Jeffery Joseph-daughter  Physical Exam Vitals reviewed.  Constitutional:      General: He is not in acute distress.     Appearance: He is not ill-appearing.  HENT:     Head: Normocephalic and atraumatic.  Pulmonary:     Effort: Pulmonary effort is normal. No respiratory distress.  Musculoskeletal:     Right lower leg: No edema.     Left lower leg: No edema.  Skin:    General: Skin is warm and dry.  Neurological:     Mental Status: He is alert and oriented to person, place, and time.  Psychiatric:        Mood and Affect: Mood normal.        Behavior: Behavior normal.        Thought Content: Thought content normal.        Judgment: Judgment normal.   Recommendations/Plan: FULL CODE status as previously  documented   Daughter to provide living will to be scanned under [facility] tab in epic Continue current supportive interventions Plan to d/c home when medically stable Outpatient palliative to follow patient at discharge  Palliative Assessment/Data: 60%     Thank you for this consult. Palliative medicine will continue to follow and assist holistically.   Time Total: 75 minutes  Time spent includes: Detailed review of medical records (labs, imaging, vital signs), medically appropriate exam (mental status, respiratory, cardiac, skin), discussed with treatment team, counseling and educating patient, family and staff, documenting clinical information, medication management and coordination of care.  1700: RN advises that patient daughter has provided Living Will documentation. Upon inspection, desire for natural death with no intubation, artificial feeding, hydration or other means of life sustaining treatment wishes were confirmed. Living will scanned in to [facility] and placed in chart. DNR was completed, signed, placed in chart and scanned into [facility].   Discussed with primary RN.     Devere Sacks, ELNITA- [facility] Palliative Medicine Team  07/19/2023 9:12 AM  Office [phone]  Pager [phone]     Please contact Palliative Medicine Team providers via AMION for questions and concerns.

## 2023-07-19 NOTE — Progress Notes (Signed)
 Entered room and introduced myself as RN who helps with admissions and discharges. Patient is in bed.   Explained to patient medical readiness day is tomorrow (07/20/23) and this means we hope he  is stable and ready for discharge at that time. Patient agrees he  hopes he  is ready. Patient states someone from his family; likely his daughter or wife, can be here to transport patient home when medically ready. Explained this date is tentative and based on clinical status and final discharge determination will be made by his provider. Patient voiced understanding.  At this time, there are no social barriers to discharge. Patient states he does not use oxygen, and has plenty of DME at home.

## 2023-07-19 NOTE — TOC Initial Note (Signed)
 Transition of Care [facility]) - Initial/Assessment Note    Patient Details  Name: Jeffery Joseph MRN: [id number] Date of Birth: 1940-02-04  Transition of Care [facility]) CM/SW Contact:    Lauraine JAYSON Carpen, LCSW Phone Number: 07/19/2023, 11:07 AM  Clinical Narrative:  Readmission prevention screen complete. CSW met with patient. Daughter at bedside. CSW introduced role and explained that discharge planning would be discussed. PCP is Alm Needle, MD. Daughters drive him to appointments. [facility] [place] and the [facility]. No issues obtaining medications. Patient lives home with his wife and daughter. No home health prior to admission. He has canes (4-prong and SPC) at home. No further concerns. CSW will continue to follow patient for support and facilitate return home once stable. Family will transport him home at discharge.                Expected Discharge Plan: Home/Self Care Barriers to Discharge: Continued Medical Work up   Patient Goals and CMS Choice            Expected Discharge Plan and Services     Post Acute Care Choice: NA Living arrangements for the past 2 months: Single Family Home                                      Prior Living Arrangements/Services Living arrangements for the past 2 months: Single Family Home Lives with:: Spouse, Adult Children Patient language and need for interpreter reviewed:: Yes Do you feel safe going back to the place where you live?: Yes      Need for Family Participation in Patient Care: Yes (Comment) Care giver support system in place?: Yes (comment) Current home services: DME Criminal Activity/Legal Involvement Pertinent to Current Situation/Hospitalization: No - Comment as needed  Activities of Daily Living   ADL Screening (condition at time of admission) Independently performs ADLs?: Yes (appropriate for developmental age) Is the patient deaf or have difficulty hearing?: No Does the patient have  difficulty seeing, even when wearing glasses/contacts?: No Does the patient have difficulty concentrating, remembering, or making decisions?: No  Permission Sought/Granted Permission sought to share information with : Family Supports          Permission granted to share info w Relationship: Daughter at bedside     Emotional Assessment Appearance:: Appears stated age Attitude/Demeanor/Rapport: Engaged, Gracious Affect (typically observed): Accepting, Appropriate, Calm, Pleasant Orientation: : Oriented to Self, Oriented to Place, Oriented to  Time, Oriented to Situation Alcohol / Substance Use: Not Applicable Psych Involvement: No (comment)  Admission diagnosis:  Acute pulmonary edema (HCC) [J81.0] SOB (shortness of breath) [R06.02] Elevated troponin [R79.89] Patient Active Problem List   Diagnosis Date Noted   Elevated troponin 07/18/2023   Hypertensive emergency 07/18/2023   SOB (shortness of breath) 07/18/2023   Hypokalemia 07/18/2023   Acute pulmonary edema (HCC) 07/17/2023   Type 2 diabetes mellitus (HCC) 05/30/2023   End stage renal disease (HCC) 11/14/2021   Benign prostatic hyperplasia with lower urinary tract symptoms 08/12/2019   Benign essential hypertension 10/27/2018   PCP:  Needle Alm SQUIBB, MD Pharmacy:   [facility] [place], [place] - [address] [place] [address] Phone: [phone] Fax: [phone]  [facility] [address] [place] [facility] [address] [place] [address] Phone: [phone] Fax: [phone]     Social  Drivers of Health (SDOH) Social History: SDOH Screenings   Food Insecurity: No Food Insecurity (07/18/2023)  Housing: Low Risk  (07/18/2023)  Transportation Needs: No Transportation Needs (07/18/2023)  Utilities: Not At Risk (07/18/2023)  Financial Resource Strain: Low Risk  (11/25/2022)   Received from [facility]  Social Connections: Socially  Integrated (07/18/2023)  Tobacco Use: Low Risk  (07/18/2023)   SDOH Interventions:     Readmission Risk Interventions    07/19/2023   11:05 AM  Readmission Risk Prevention Plan  Transportation Screening Complete  PCP or Specialist Appt within 3-5 Days Complete  Social Work Consult for Recovery Care Planning/Counseling Complete  Palliative Care Screening Not Applicable  Medication Review Oceanographer) Complete

## 2023-07-19 NOTE — Progress Notes (Signed)
 Report given to [facility]. Added her to chat regarding Amiodarone  PO. She will give oral amiodarone  once able to pull from pyxis and stop drip 1 hour after per Dr. Florencio.

## 2023-07-19 NOTE — Consult Note (Addendum)
 PHARMACY CONSULT NOTE - FOLLOW UP  Pharmacy Consult for Electrolyte Monitoring and Replacement   Recent Labs: Potassium (mmol/L)  Date Value  07/19/2023 3.6  07/19/2023 3.6  05/06/2012 4.1   Magnesium (mg/dL)  Date Value  92/92/7974 2.0   Calcium (mg/dL)  Date Value  92/92/7974 9.1  07/19/2023 9.1   Calcium, Total (mg/dL)  Date Value  95/74/7985 9.5   Albumin (g/dL)  Date Value  92/93/7974 2.9 (L)   Phosphorus (mg/dL)  Date Value  92/93/7974 2.9   Sodium (mmol/L)  Date Value  07/19/2023 143  07/19/2023 144  05/06/2012 139     Assessment: 83 y.o. 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 Pharmacy is asked to follow and replace electrolytes   Goal of Therapy:  K>4, Mag>2  Plan:  K 3.6: Kcl 40mEq PO x 1(patient also on spironolactone  12.5mg  daily) Will recheck BMP, Mag, and Phos with morning labs  Jennene Downie A Yezenia Fredrick ,PharmD Clinical Pharmacist 07/19/2023 10:43 AM

## 2023-07-19 NOTE — Progress Notes (Signed)
 1      PROGRESS NOTE    Jeffery Joseph  FMW:[id number] DOB: 1940/02/02 DOA: 07/17/2023 PCP: Epifanio Alm SQUIBB, MD    Brief Narrative:   83 y.o. 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  -Continue metoprolol   -Heparin  drip for now -> transition of Eliquis  tomorrow   # Hypertensive emergency # Acute symptomatic hypertension - Found to have SBP in the 170s-190s on admission with evidence of acute pulmonary edema - SBP improved to the 120s to 130s after IV Lasix  and resuming home BP meds - Continue amlodipine , Lopressor , hydralazine  and enalapril , spironolactone   - echocardiogram shows normal LV function   # Elevated troponin - Patient found to have troponin of 154 on admission.  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  Continue IV Lasix  40 mg every 8 hours   # T2DM - Hemoglobin A1c of 5.2 - Blood glucose of 122 on CMP - Continue glipizide  - SSI with meals, CBG monitoring   # Anemia of chronic disease - Hgb stable at 10.1 - Trend CBC   # PAD - Continue aspirin  and Plavix    # BPH - Continue finasteride  and tamsulosin    # Anxiety and depression - Continue as needed Xanax    # Dementia - Continue Aricept   # Constipation Add senokot-s and miralax    DVT prophylaxis: Heparin  drip      Code Status: DNR Family Communication: Discussed with patient's daughter/Dava and other daughter at bedside Disposition Plan: Possible discharge in next 2 to 3 days depending on cardiology, nephrology workup and his clinical condition   Consultants:  Cardiology Nephrology   Subjective:  Reports No BM since last Thursday and agreeable for bowel regimen, sob improving   Objective: Vitals:   07/19/23 0556 07/19/23 0805 07/19/23 1456 07/19/23 1935  BP:  (!) 141/56 136/81 (!) 120/102  Pulse:  ROLLEN)  59 (!) 130 (!) 56  Resp:  18  18  Temp:  98.4 F (36.9 C) 99.7 F (37.6 C) (!) 97.1 F (36.2 C)  TempSrc:      SpO2:  96% 98% 96%  Weight: 125.6 kg     Height:        Intake/Output Summary (Last 24 hours) at 07/19/2023 2201 Last data filed at 07/19/2023 1900 Gross per 24 hour  Intake 1435.12 ml  Output 2250 ml  Net -814.88 ml   Filed Weights   07/17/23 2028 07/19/23 0556  Weight: 118.4 kg 125.6 kg    Examination:  General exam: Appears calm and comfortable   Respiratory system: Clear to auscultation. Respiratory effort normal. Cardiovascular system: S1 & S2 heard, RRR.  Crackles at the bases.  2+ pedal edema Gastrointestinal system: Abdomen is soft, benign, abdominal wall edema present Central nervous system: Alert and oriented. No focal neurological deficits. Extremities: Symmetric 5 x 5 power. Skin: No rashes, lesions or ulcers Psychiatry: Judgement and insight appear normal. Mood & affect appropriate.  Left AV fistula for dialysis access   Data Reviewed: I have personally reviewed following labs and imaging studies  CBC: Recent Labs  Lab 07/17/23 2042 07/18/23 0454 07/19/23 0748  WBC 7.5 6.7 8.1  HGB 10.1* 9.5* 11.1*  HCT 33.6* 30.8* 36.0*  MCV 85.1 83.9 83.3  PLT 187 176 221   Basic Metabolic Panel: Recent Labs  Lab 07/17/23 2042 07/18/23 0342 07/19/23 0748  NA 143 142 144  143  K 3.3* 3.2* 3.6  3.6  CL 110 109 104  103  CO2 23 23 27  26   GLUCOSE 122* 110* 94  93  BUN 40* 39* 43*  42*  CREATININE 3.20* 3.00* 3.26*  3.31*  CALCIUM 8.8* 8.4* 9.1  9.1  MG  --   --  2.0  PHOS  --  2.9  --    GFR: Estimated Creatinine Clearance: 24.8 mL/min (A) (by C-G formula based on SCr of 3.31 mg/dL (H)). Liver Function Tests: Recent Labs  Lab 07/17/23 2042 07/18/23 0342  AST 23  --   ALT 35  --   ALKPHOS 54  --   BILITOT 1.0  --   PROT 6.6  --   ALBUMIN 3.2* 2.9*   No results for input(s): LIPASE, AMYLASE in the last 168 hours. No results for input(s): AMMONIA in the last 168 hours. Coagulation Profile: Recent Labs  Lab 07/17/23 2042  INR 1.3*   Cardiac Enzymes: No results for input(s): CKTOTAL, CKMB, CKMBINDEX, TROPONINI in the last 168 hours. BNP (last 3 results) No results for input(s): PROBNP in the last 8760 hours. HbA1C: Recent Labs    07/18/23 0454  HGBA1C 5.2   CBG: Recent Labs  Lab 07/19/23 0055 07/19/23 0807 07/19/23 1148 07/19/23 1745 07/19/23 2149  GLUCAP 95 93 99  107* 137*   Lipid Profile: No results for input(s): CHOL, HDL, LDLCALC, TRIG, CHOLHDL, LDLDIRECT in the last 72 hours. Thyroid Function Tests: No results for input(s): TSH, T4TOTAL, FREET4, T3FREE, THYROIDAB in the last 72 hours. Anemia Panel: No results for input(s): VITAMINB12, FOLATE, FERRITIN, TIBC, IRON, RETICCTPCT in the last 72 hours. Sepsis Labs: Recent Labs  Lab 07/17/23 2042 07/17/23 2344 07/18/23 1902 07/18/23 2200  LATICACIDVEN 1.2 1.2 1.0 0.8    Recent Results (from the past 240 hours)  Resp panel by RT-PCR (RSV, Flu A&B, Covid) Anterior Nasal Swab     Status: None   Collection Time: 07/17/23  8:42 PM  Specimen: Anterior Nasal Swab  Result Value Ref Range Status   SARS Coronavirus 2 by RT PCR NEGATIVE NEGATIVE Final    Comment: (NOTE) SARS-CoV-2 target nucleic acids are NOT DETECTED.  The SARS-CoV-2 RNA is generally detectable in upper respiratory specimens during the acute phase of infection. The lowest concentration of SARS-CoV-2 viral copies this assay can detect is 138 copies/mL. A negative result does not preclude SARS-Cov-2 infection and should not be used as the sole basis for treatment or other patient management decisions. A negative result may occur with  improper specimen collection/handling, submission of specimen other than nasopharyngeal swab, presence of viral mutation(s) within the areas targeted by this assay, and inadequate number of viral copies(<138 copies/mL). A negative result must be combined with clinical observations, patient history, and epidemiological information. The expected result is Negative.  Fact Sheet for Patients:  BloggerCourse.com  Fact Sheet for Healthcare Providers:  SeriousBroker.it  This test is no t yet approved or cleared by the [place]  FDA and  has been authorized for detection and/or diagnosis of SARS-CoV-2 by FDA under an  Emergency Use Authorization (EUA). This EUA will remain  in effect (meaning this test can be used) for the duration of the COVID-19 declaration under Section 564(b)(1) of the Act, 21 U.S.C.section 360bbb-3(b)(1), unless the authorization is terminated  or revoked sooner.       Influenza A by PCR NEGATIVE NEGATIVE Final   Influenza B by PCR NEGATIVE NEGATIVE Final    Comment: (NOTE) The Xpert Xpress SARS-CoV-2/FLU/RSV plus assay is intended as an aid in the diagnosis of influenza from Nasopharyngeal swab specimens and should not be used as a sole basis for treatment. Nasal washings and aspirates are unacceptable for Xpert Xpress SARS-CoV-2/FLU/RSV testing.  Fact Sheet for Patients: BloggerCourse.com  Fact Sheet for Healthcare Providers: SeriousBroker.it  This test is not yet approved or cleared by the [place]  FDA and has been authorized for detection and/or diagnosis of SARS-CoV-2 by FDA under an Emergency Use Authorization (EUA). This EUA will remain in effect (meaning this test can be used) for the duration of the COVID-19 declaration under Section 564(b)(1) of the Act, 21 U.S.C. section 360bbb-3(b)(1), unless the authorization is terminated or revoked.     Resp Syncytial Virus by PCR NEGATIVE NEGATIVE Final    Comment: (NOTE) Fact Sheet for Patients: BloggerCourse.com  Fact Sheet for Healthcare Providers: SeriousBroker.it  This test is not yet approved or cleared by the [place]  FDA and has been authorized for detection and/or diagnosis of SARS-CoV-2 by FDA under an Emergency Use Authorization (EUA). This EUA will remain in effect (meaning this test can be used) for the duration of the COVID-19 declaration under Section 564(b)(1) of the Act, 21 U.S.C. section 360bbb-3(b)(1), unless the authorization is terminated or revoked.  Performed at [facility], [address] [place], [place] [address]   Blood culture (routine x 2)     Status: None (Preliminary result)   Collection Time: 07/17/23  8:42 PM   Specimen: BLOOD  Result Value Ref Range Status   Specimen Description BLOOD RIGHT FOREARM  Final   Special Requests   Final    BOTTLES DRAWN AEROBIC AND ANAEROBIC Blood Culture adequate volume   Culture   Final    NO GROWTH 2 DAYS Performed at [facility], [address] [place], [place] [address]    Report Status PENDING  Incomplete  Blood culture (routine x 2)     Status: None (Preliminary result)  Collection Time: 07/17/23 10:33 PM   Specimen: BLOOD  Result Value Ref Range Status   Specimen Description BLOOD RIGHT ASSIST CONTROL  Final   Special Requests   Final    BOTTLES DRAWN AEROBIC AND ANAEROBIC Blood Culture results may not be optimal due to an inadequate volume of blood received in culture bottles   Culture   Final    NO GROWTH 2 DAYS Performed at [facility], [address], [place], [place] [address]    Report Status PENDING  Incomplete         Radiology Studies: DG Chest 2 View Result Date: 07/19/2023 CLINICAL DATA:  [id number] Dyspnea [id number] EXAM: CHEST - 2 VIEW COMPARISON:  July 18, 2023 FINDINGS: Lower lung volumes. Bilateral perihilar interstitial opacities throughout both lungs. Hazy airspace opacities within the lung bases with blunting of the costophrenic sulci. No pneumothorax. Mild cardiomegaly. Tortuous aorta with aortic atherosclerosis. No acute fracture or destructive lesions. Multilevel thoracic osteophytosis. IMPRESSION: Lower lung volumes. Redemonstrated cardiomegaly with findings of pulmonary edema. Increasing hazy airspace opacities within the lung bases, possibly due to pleural effusions and atelectasis or worsening edema. Electronically Signed   By: Rogelia Myers M.D.   On: 07/19/2023 08:22   DG Chest Port 1 View Result Date: 07/18/2023 CLINICAL DATA:  Dyspnea EXAM: PORTABLE  CHEST 1 VIEW COMPARISON:  07/17/2023 FINDINGS: Cardiomegaly with vascular congestion and mild diffuse interstitial and ground-glass opacity, improved compared with 07/17/2023. Small bilateral effusions. No pneumothorax. Stent in the left axillary region IMPRESSION: Cardiomegaly with vascular congestion and edema, improved compared with 07/17/2023. Small bilateral effusions. Electronically Signed   By: Luke Bun M.D.   On: 07/18/2023 20:14   ECHOCARDIOGRAM COMPLETE Result Date: 07/18/2023    ECHOCARDIOGRAM REPORT   Patient Name:   RENEL ENDE Date of Exam: 07/18/2023 Medical Rec #:  [id number]     Height:       77.0 in Accession #:    [id number]    Weight:       261.0 lb Date of Birth:  [birth year]     BSA:          2.505 m Patient Age:    82 years      BP:           157/58 mmHg Patient Gender: M             HR:           63 bpm. Exam Location:  [facility] Procedure: 2D Echo, Cardiac Doppler and Color Doppler (Both Spectral and Color            Flow Doppler were utilized during procedure). Indications:     Dyspnea R06.00  History:         Patient has no prior history of Echocardiogram examinations.  Sonographer:     Thedora Louder RDCS, FASE Referring Phys:  [id number] CLARETTA HERO AMPONSAH Diagnosing Phys: Cara JONETTA Lovelace MD  Sonographer Comments: This study was performed with the patient positioned supine. IMPRESSIONS  1. Left ventricular ejection fraction, by estimation, is 55 to 60%. The left ventricle has normal function. The left ventricle has no regional wall motion abnormalities. The left ventricular internal cavity size was mildly dilated. There is moderate concentric left ventricular hypertrophy. Left ventricular diastolic parameters are consistent with Grade III diastolic dysfunction (restrictive).  2. Right ventricular systolic function is normal. The right ventricular size is normal.  3. Left atrial size was moderately dilated.  4. Right atrial size was mild  to moderately dilated.  5. The mitral valve is  grossly normal. Trivial mitral valve regurgitation.  6. Tricuspid valve regurgitation is mild to moderate.  7. The aortic valve is grossly normal. Aortic valve regurgitation is mild. Mild aortic valve stenosis. FINDINGS  Left Ventricle: Left ventricular ejection fraction, by estimation, is 55 to 60%. The left ventricle has normal function. The left ventricle has no regional wall motion abnormalities. Strain was performed and the global longitudinal strain is indeterminate. Global longitudinal strain performed but not reported based on interpreter judgement due to suboptimal tracking. The left ventricular internal cavity size was mildly dilated. There is moderate concentric left ventricular hypertrophy. Left ventricular diastolic parameters are consistent with Grade III diastolic dysfunction (restrictive). Right Ventricle: The right ventricular size is normal. No increase in right ventricular wall thickness. Right ventricular systolic function is normal. Left Atrium: Left atrial size was moderately dilated. Right Atrium: Right atrial size was mild to moderately dilated. Pericardium: There is no evidence of pericardial effusion. Mitral Valve: The mitral valve is grossly normal. Trivial mitral valve regurgitation. Tricuspid Valve: The tricuspid valve is normal in structure. Tricuspid valve regurgitation is mild to moderate. Aortic Valve: The aortic valve is grossly normal. Aortic valve regurgitation is mild. Aortic regurgitation PHT measures 505 msec. Mild aortic stenosis is present. Aortic valve mean gradient measures 20.8 mmHg. Aortic valve peak gradient measures 41.7 mmHg. Aortic valve area, by VTI measures 1.61 cm. Pulmonic Valve: The pulmonic valve was normal in structure. Pulmonic valve regurgitation is not visualized. Aorta: The ascending aorta was not well visualized. IAS/Shunts: No atrial level shunt detected by color flow Doppler. Additional Comments: 3D was performed not requiring image post processing on  an independent workstation and was indeterminate.  LEFT VENTRICLE PLAX 2D LVIDd:         5.50 cm   Diastology LVIDs:         3.90 cm   LV e' medial:    8.16 cm/s LV PW:         1.50 cm   LV E/e' medial:  19.9 LV IVS:        1.60 cm   LV e' lateral:   12.50 cm/s LVOT diam:     2.10 cm   LV E/e' lateral: 13.0 LV SV:         116 LV SV Index:   46 LVOT Area:     3.46 cm  RIGHT VENTRICLE RV Basal diam:  3.60 cm RV S prime:     14.30 cm/s TAPSE (M-mode): 2.6 cm LEFT ATRIUM             Index        RIGHT ATRIUM           Index LA diam:        5.00 cm 2.00 cm/m   RA Area:     25.20 cm LA Vol (A2C):   93.0 ml 37.13 ml/m  RA Volume:   79.90 ml  31.90 ml/m LA Vol (A4C):   89.0 ml 35.53 ml/m LA Biplane Vol: 94.0 ml 37.53 ml/m  AORTIC VALVE                     PULMONIC VALVE AV Area (Vmax):    1.58 cm      PV Vmax:          1.33 m/s AV Area (Vmean):   1.57 cm      PV Peak grad:     7.1  mmHg AV Area (VTI):     1.61 cm      PR End Diast Vel: 11.42 msec AV Vmax:           323.00 cm/s   RVOT Peak grad:   7 mmHg AV Vmean:          211.000 cm/s AV VTI:            0.725 m AV Peak Grad:      41.7 mmHg AV Mean Grad:      20.8 mmHg LVOT Vmax:         147.00 cm/s LVOT Vmean:        95.400 cm/s LVOT VTI:          0.336 m LVOT/AV VTI ratio: 0.46 AI PHT:            505 msec  AORTA Ao Root diam: 3.30 cm Ao Asc diam:  2.60 cm MITRAL VALVE                TRICUSPID VALVE MV Area (PHT): 3.42 cm     TR Peak grad:   59.0 mmHg MV Decel Time: 222 msec     TR Vmax:        384.00 cm/s MR Peak grad: 168.5 mmHg MR Mean grad: 117.0 mmHg    SHUNTS MR Vmax:      649.00 cm/s   Systemic VTI:  0.34 m MR Vmean:     513.0 cm/s    Systemic Diam: 2.10 cm MV E velocity: 162.00 cm/s MV A velocity: 73.90 cm/s MV E/A ratio:  2.19 Dwayne D Callwood MD Electronically signed by Cara JONETTA Lovelace MD Signature Date/Time: 07/18/2023/8:39:21 AM    Final         Scheduled Meds:  ALPRAZolam   0.5 mg Oral BID   amiodarone   200 mg Oral BID   amLODipine   10 mg  Oral Daily   aspirin  EC  81 mg Oral Daily   cholecalciferol   5,000 Units Oral Daily   clopidogrel   75 mg Oral Daily   donepezil   10 mg Oral QHS   enalapril   20 mg Oral BID   finasteride   5 mg Oral Daily   furosemide   40 mg Intravenous Q8H   glipiZIDE   5 mg Oral Q breakfast   hydrALAZINE   100 mg Oral TID   insulin  aspart  0-6 Units Subcutaneous TID WC   metoprolol  tartrate  12.5 mg Oral TID   polyethylene glycol  17 g Oral BID   senna-docusate  2 tablet Oral BID   sodium bicarbonate   650 mg Oral BID   spironolactone   12.5 mg Oral Daily   tamsulosin   0.4 mg Oral Daily   Continuous Infusions:  azithromycin  (ZITHROMAX ) 500 mg in sodium chloride  0.9 % 250 mL IVPB 500 mg (07/18/23 2156)   cefTRIAXone  (ROCEPHIN )  IV 1 g (07/19/23 2014)   heparin  2,050 Units/hr (07/19/23 1527)     LOS: 2 days    Time spent: 35 minutes    Aidan Moten Maree, MD [facility] Pager [phone]  If 7PM-7AM, please contact night-coverage [email or web address] Password [email or web address] 07/19/2023, 10:01 PM

## 2023-07-19 NOTE — Progress Notes (Signed)
 Per Dr. Florencio give amiodarone  PO and stop amiodarone  drip 1 hour after PO dose. Patients heart rate 60-80's. Continue to assess.

## 2023-07-19 NOTE — Progress Notes (Signed)
 OT Cancellation Note  Patient Details Name: Jeffery Joseph MRN: [id number] DOB: 23-Jul-1940   Cancelled Treatment:    Reason Eval/Treat Not Completed: Other (comment);Patient at procedure or test/ unavailable. Orders received, chart reviewed. Upon OT arrival, pt currently with transport and unavailable. OT will check back as able.   Ambera Fedele L. Jethro Radke, OTR/L  07/19/23, 2:49 PM

## 2023-07-19 NOTE — Progress Notes (Signed)
 Endorsed to day nurse Rankin RN that patient asked his daughter for a stool softener. Patient told Daughter Dava it has been 5 days since his last BM, told this nurse a couple days ago.

## 2023-07-19 NOTE — Progress Notes (Signed)
 Report received from AM Nurse Nena Bark and chat with Dr. Cara Lovelace to continue Amiodarone  drip, give 2200 dose of Amiodarone  and turn IV Amiodarone  one hour after po dose is administered. Patient up in bed, no C/O pain. No S/S of bleeding. Call light within reach.

## 2023-07-19 NOTE — Progress Notes (Addendum)
 [facility] CLINIC CARDIOLOGY PROGRESS NOTE       Patient ID: Jeffery Joseph MRN: [id number] DOB/AGE: 84-Mar-1942 84 y.o.  Admit date: 07/17/2023 Referring Physician Dr. Lou Primary Physician Epifanio Alm SQUIBB, MD Primary Cardiologist None Reason for Consultation SOB, elevated trops  HPI: Jeffery Joseph is a 83 y.o. 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  Patient is without chest pain or palpitations.  Cardiology was consulted for further evaluation.    Interval History: -Patient seen and examined this AM and laying comfortably in hospital bed. Patient states he feels good this morning and reports much improvement to SOB and continues to deny any chest pain or palpitations. -Patients BP stable and HR  intermittently tachycardic this AM. Per tele this AM SR 60s, later in the AM atrial flutter with sustained rate 120s -Yesterday UOP 3.5L, light yellow color urine. Increased Cr 3.0 > 3.26 (Nephro following) -Patient remains on room air with stable SpO2.  -No fever this AM.   Review of systems complete and found to be negative unless listed above    Past Medical History:  Diagnosis Date   Anxiety    Arthritis    Depression    Diabetes mellitus without complication (HCC)    Hypertension    Pneumonia    Renal disorder     Past Surgical History:  Procedure Laterality Date   A/V FISTULAGRAM Left 06/08/2023   Procedure: A/V Fistulagram;  Joseph: Jama Cordella MATSU, MD;  Location: [facility] INVASIVE CV LAB;  Service: Cardiovascular;  Laterality: Left;   APPLICATION OF WOUND VAC Right 11/14/2021   Procedure: APPLICATION OF WOUND VAC;  Joseph: Jama Cordella MATSU, MD;  Location: [facility] ORS;  Service: Vascular;  Laterality: Right;   AV FISTULA PLACEMENT Left 11/14/2021    Procedure: ARTERIOVENOUS (AV) FISTULA CREATION ( BRACHIAL CEPHALIC);  Joseph: Jama Cordella MATSU, MD;  Location: [facility] ORS;  Service: Vascular;  Laterality: Left;    Medications Prior to Admission  Medication Sig Dispense Refill Last Dose/Taking   acetaminophen  (TYLENOL ) 325 MG tablet Take 650 mg by mouth every 6 (six) hours as needed.   Unknown   ALPRAZolam  (XANAX ) 0.5 MG tablet Take 0.5 mg by mouth 3 (three) times daily as needed for sleep.   07/17/2023 Morning   amLODipine  (NORVASC ) 10 MG tablet Take 10 mg by mouth daily.   07/17/2023 Morning   aspirin  EC 81 MG tablet Take 81 mg by mouth daily. Swallow whole.   07/17/2023 Morning   Cholecalciferol  125 MCG (5000 UT) TABS Take 5,000 Units by mouth daily.   07/17/2023 Morning   clopidogrel  (PLAVIX ) 75 MG tablet Take 1 tablet (75 mg total) by mouth daily. 30 tablet 11 07/17/2023 Morning   Docusate Sodium  (DSS) 100 MG CAPS Take 100 mg by mouth at bedtime.   07/16/2023 Bedtime   donepezil  (ARICEPT ) 10 MG tablet Take 10 mg by mouth at bedtime.   07/16/2023 Bedtime   enalapril  (VASOTEC ) 20 MG tablet Take 20 mg by mouth 2 (two) times daily.   07/17/2023 Evening   finasteride  (PROSCAR ) 5 MG tablet Take 1 tablet (5 mg total) by mouth daily. 90 tablet 3 07/17/2023 Morning   glipiZIDE  (GLUCOTROL  XL) 5 MG 24 hr tablet Take by mouth.   07/17/2023 Morning   hydrALAZINE  (APRESOLINE )  50 MG tablet Take 100 mg by mouth 3 (three) times daily.   07/17/2023 Evening   LOKELMA 10 g PACK packet Take 1 packet by mouth daily.   07/17/2023 Morning   metoprolol  tartrate (LOPRESSOR ) 25 MG tablet Take 25 mg by mouth 2 (two) times daily in the am and at bedtime..   07/17/2023 Morning   sodium bicarbonate  650 MG tablet Take 650 mg by mouth 2 (two) times daily.   07/17/2023 Evening   tamsulosin  (FLOMAX ) 0.4 MG CAPS capsule Take 1 capsule (0.4 mg total) by mouth daily. 90 capsule 3 07/17/2023 Morning   Patiromer Sorbitex Calcium 16.8 g PACK Take by mouth. (Patient not taking: Reported on 07/17/2023)   Not  Taking   Social History   Socioeconomic History   Marital status: Married    Spouse name: ,Loraine   Number of children: Not on file   Years of education: Not on file   Highest education level: Not on file  Occupational History   Not on file  Tobacco Use   Smoking status: Never   Smokeless tobacco: Never  Substance and Sexual Activity   Alcohol use: No   Drug use: Not Currently   Sexual activity: Not Currently  Other Topics Concern   Not on file  Social History Narrative   Not on file   Social Drivers of Health   Financial Resource Strain: Low Risk  (11/25/2022)   Received from [facility]   Overall Financial Resource Strain (CARDIA)    Difficulty of Paying Living Expenses: Not hard at all  Food Insecurity: No Food Insecurity (07/18/2023)   Hunger Vital Sign    Worried About Running Out of Food in the Last Year: Never true    Ran Out of Food in the Last Year: Never true  Transportation Needs: No Transportation Needs (07/18/2023)   PRAPARE - Administrator, Civil Service (Medical): No    Lack of Transportation (Non-Medical): No  Physical Activity: Not on file  Stress: Not on file  Social Connections: Socially Integrated (07/18/2023)   Social Connection and Isolation Panel    Frequency of Communication with Friends and Family: More than three times a week    Frequency of Social Gatherings with Friends and Family: More than three times a week    Attends Religious Services: More than 4 times per year    Active Member of [employer] or Organizations: Yes    Attends Banker Meetings: More than 4 times per year    Marital Status: Married  Catering manager Violence: Not At Risk (07/18/2023)   Humiliation, Afraid, Rape, and Kick questionnaire    Fear of Current or Ex-Partner: No    Emotionally Abused: No    Physically Abused: No    Sexually Abused: No    History reviewed. No pertinent family history.   Vitals:   07/19/23 0052 07/19/23 0437  07/19/23 0556 07/19/23 0805  BP: 137/62 (!) 154/66  (!) 141/56  Pulse: (!) 59 62  (!) 59  Resp: 16 15  18   Temp: (!) 100.5 F (38.1 C) 100 F (37.8 C)  98.4 F (36.9 C)  TempSrc: Oral Oral    SpO2: 100% 100%  96%  Weight:   125.6 kg   Height:        PHYSICAL EXAM General: Well-appearing elderly male, well nourished, in no acute distress. HEENT: Normocephalic and atraumatic. Neck: No JVD.   Lungs: Normal respiratory effort on room air. Clear bilaterally to  auscultation. No wheezes, crackles, rhonchi.  Heart: Irregular, elevated rates. Normal S1 and S2 without gallops or murmurs.  Abdomen: Non-distended appearing.  Msk: Normal strength and tone for age. Extremities: Warm and well perfused. No clubbing, cyanosis. 2+ pedal edema.  Neuro: Alert and oriented X 3. Psych: Answers questions appropriately.  Labs: Basic Metabolic Panel: Recent Labs    07/18/23 0342 07/19/23 0748  NA 142 144  143  K 3.2* 3.6  3.6  CL 109 104  103  CO2 23 27  26   GLUCOSE 110* 94  93  BUN 39* 43*  42*  CREATININE 3.00* 3.26*  3.31*  CALCIUM 8.4* 9.1  9.1  MG  --  2.0  PHOS 2.9  --    Liver Function Tests: Recent Labs    07/17/23 2042 07/18/23 0342  AST 23  --   ALT 35  --   ALKPHOS 54  --   BILITOT 1.0  --   PROT 6.6  --   ALBUMIN 3.2* 2.9*   No results for input(s): LIPASE, AMYLASE in the last 72 hours. CBC: Recent Labs    07/18/23 0454 07/19/23 0748  WBC 6.7 8.1  HGB 9.5* 11.1*  HCT 30.8* 36.0*  MCV 83.9 83.3  PLT 176 221   Cardiac Enzymes: Recent Labs    07/18/23 1630 07/18/23 1735 07/18/23 2050  TROPONINIHS 555* 605* 547*   BNP: Recent Labs    07/17/23 2042 07/19/23 0748  BNP 2,410.3* 2,062.1*   D-Dimer: No results for input(s): DDIMER in the last 72 hours. Hemoglobin A1C: Recent Labs    07/18/23 0454  HGBA1C 5.2   Fasting Lipid Panel: No results for input(s): CHOL, HDL, LDLCALC, TRIG, CHOLHDL, LDLDIRECT in the last 72  hours. Thyroid Function Tests: No results for input(s): TSH, T4TOTAL, T3FREE, THYROIDAB in the last 72 hours.  Invalid input(s): FREET3 Anemia Panel: No results for input(s): VITAMINB12, FOLATE, FERRITIN, TIBC, IRON, RETICCTPCT in the last 72 hours.   Radiology: DG Chest 2 View Result Date: 07/19/2023 CLINICAL DATA:  [id number] Dyspnea [id number] EXAM: CHEST - 2 VIEW COMPARISON:  July 18, 2023 FINDINGS: Lower lung volumes. Bilateral perihilar interstitial opacities throughout both lungs. Hazy airspace opacities within the lung bases with blunting of the costophrenic sulci. No pneumothorax. Mild cardiomegaly. Tortuous aorta with aortic atherosclerosis. No acute fracture or destructive lesions. Multilevel thoracic osteophytosis. IMPRESSION: Lower lung volumes. Redemonstrated cardiomegaly with findings of pulmonary edema. Increasing hazy airspace opacities within the lung bases, possibly due to pleural effusions and atelectasis or worsening edema. Electronically Signed   By: Rogelia Myers M.D.   On: 07/19/2023 08:22   DG Chest Port 1 View Result Date: 07/18/2023 CLINICAL DATA:  Dyspnea EXAM: PORTABLE CHEST 1 VIEW COMPARISON:  07/17/2023 FINDINGS: Cardiomegaly with vascular congestion and mild diffuse interstitial and ground-glass opacity, improved compared with 07/17/2023. Small bilateral effusions. No pneumothorax. Stent in the left axillary region IMPRESSION: Cardiomegaly with vascular congestion and edema, improved compared with 07/17/2023. Small bilateral effusions. Electronically Signed   By: Luke Bun M.D.   On: 07/18/2023 20:14   ECHOCARDIOGRAM COMPLETE Result Date: 07/18/2023    ECHOCARDIOGRAM REPORT   Patient Name:   RENATO SPELLMAN Date of Exam: 07/18/2023 Medical Rec #:  [id number]     Height:       77.0 in Accession #:    [id number]    Weight:       261.0 lb Date of Birth:  [birth year]     BSA:  2.505 m Patient Age:    82 years      BP:           157/58 mmHg Patient Gender:  M             HR:           63 bpm. Exam Location:  [facility] Procedure: 2D Echo, Cardiac Doppler and Color Doppler (Both Spectral and Color            Flow Doppler were utilized during procedure). Indications:     Dyspnea R06.00  History:         Patient has no prior history of Echocardiogram examinations.  Sonographer:     Thedora Louder RDCS, FASE Referring Phys:  [id number] CLARETTA HERO AMPONSAH Diagnosing Phys: Cara JONETTA Lovelace MD  Sonographer Comments: This study was performed with the patient positioned supine. IMPRESSIONS  1. Left ventricular ejection fraction, by estimation, is 55 to 60%. The left ventricle has normal function. The left ventricle has no regional wall motion abnormalities. The left ventricular internal cavity size was mildly dilated. There is moderate concentric left ventricular hypertrophy. Left ventricular diastolic parameters are consistent with Grade III diastolic dysfunction (restrictive).  2. Right ventricular systolic function is normal. The right ventricular size is normal.  3. Left atrial size was moderately dilated.  4. Right atrial size was mild to moderately dilated.  5. The mitral valve is grossly normal. Trivial mitral valve regurgitation.  6. Tricuspid valve regurgitation is mild to moderate.  7. The aortic valve is grossly normal. Aortic valve regurgitation is mild. Mild aortic valve stenosis. FINDINGS  Left Ventricle: Left ventricular ejection fraction, by estimation, is 55 to 60%. The left ventricle has normal function. The left ventricle has no regional wall motion abnormalities. Strain was performed and the global longitudinal strain is indeterminate. Global longitudinal strain performed but not reported based on interpreter judgement due to suboptimal tracking. The left ventricular internal cavity size was mildly dilated. There is moderate concentric left ventricular hypertrophy. Left ventricular diastolic parameters are consistent with Grade III diastolic dysfunction  (restrictive). Right Ventricle: The right ventricular size is normal. No increase in right ventricular wall thickness. Right ventricular systolic function is normal. Left Atrium: Left atrial size was moderately dilated. Right Atrium: Right atrial size was mild to moderately dilated. Pericardium: There is no evidence of pericardial effusion. Mitral Valve: The mitral valve is grossly normal. Trivial mitral valve regurgitation. Tricuspid Valve: The tricuspid valve is normal in structure. Tricuspid valve regurgitation is mild to moderate. Aortic Valve: The aortic valve is grossly normal. Aortic valve regurgitation is mild. Aortic regurgitation PHT measures 505 msec. Mild aortic stenosis is present. Aortic valve mean gradient measures 20.8 mmHg. Aortic valve peak gradient measures 41.7 mmHg. Aortic valve area, by VTI measures 1.61 cm. Pulmonic Valve: The pulmonic valve was normal in structure. Pulmonic valve regurgitation is not visualized. Aorta: The ascending aorta was not well visualized. IAS/Shunts: No atrial level shunt detected by color flow Doppler. Additional Comments: 3D was performed not requiring image post processing on an independent workstation and was indeterminate.  LEFT VENTRICLE PLAX 2D LVIDd:         5.50 cm   Diastology LVIDs:         3.90 cm   LV e' medial:    8.16 cm/s LV PW:         1.50 cm   LV E/e' medial:  19.9 LV IVS:        1.60 cm  LV e' lateral:   12.50 cm/s LVOT diam:     2.10 cm   LV E/e' lateral: 13.0 LV SV:         116 LV SV Index:   46 LVOT Area:     3.46 cm  RIGHT VENTRICLE RV Basal diam:  3.60 cm RV S prime:     14.30 cm/s TAPSE (M-mode): 2.6 cm LEFT ATRIUM             Index        RIGHT ATRIUM           Index LA diam:        5.00 cm 2.00 cm/m   RA Area:     25.20 cm LA Vol (A2C):   93.0 ml 37.13 ml/m  RA Volume:   79.90 ml  31.90 ml/m LA Vol (A4C):   89.0 ml 35.53 ml/m LA Biplane Vol: 94.0 ml 37.53 ml/m  AORTIC VALVE                     PULMONIC VALVE AV Area (Vmax):    1.58  cm      PV Vmax:          1.33 m/s AV Area (Vmean):   1.57 cm      PV Peak grad:     7.1 mmHg AV Area (VTI):     1.61 cm      PR End Diast Vel: 11.42 msec AV Vmax:           323.00 cm/s   RVOT Peak grad:   7 mmHg AV Vmean:          211.000 cm/s AV VTI:            0.725 m AV Peak Grad:      41.7 mmHg AV Mean Grad:      20.8 mmHg LVOT Vmax:         147.00 cm/s LVOT Vmean:        95.400 cm/s LVOT VTI:          0.336 m LVOT/AV VTI ratio: 0.46 AI PHT:            505 msec  AORTA Ao Root diam: 3.30 cm Ao Asc diam:  2.60 cm MITRAL VALVE                TRICUSPID VALVE MV Area (PHT): 3.42 cm     TR Peak grad:   59.0 mmHg MV Decel Time: 222 msec     TR Vmax:        384.00 cm/s MR Peak grad: 168.5 mmHg MR Mean grad: 117.0 mmHg    SHUNTS MR Vmax:      649.00 cm/s   Systemic VTI:  0.34 m MR Vmean:     513.0 cm/s    Systemic Diam: 2.10 cm MV E velocity: 162.00 cm/s MV A velocity: 73.90 cm/s MV E/A ratio:  2.19 Dwayne D Callwood MD Electronically signed by Cara JONETTA Lovelace MD Signature Date/Time: 07/18/2023/8:39:21 AM    Final    DG Chest Portable 1 View Result Date: 07/17/2023 CLINICAL DATA:  sob EXAM: PORTABLE CHEST 1 VIEW COMPARISON:  None Available. FINDINGS: Cardiac enlargement with some portion likely due to AP portable technique. Otherwise the heart and mediastinal contours are within normal limits. No focal consolidation. Pulmonary edema. No pleural effusion. No pneumothorax. No acute osseous abnormality. IMPRESSION: 1. Pulmonary edema. 2. Cardiac enlargement with some portion likely due to  AP portable technique. 3. Consider repeat chest x-ray PA and lateral view for further evaluation. Electronically Signed   By: Morgane  Naveau M.D.   On: 07/17/2023 21:07   VAS US  DUPLEX DIALYSIS ACCESS (AVF, AVG) Result Date: 07/14/2023 DIALYSIS ACCESS Patient Name:  NAZIER NEYHART  Date of Exam:   07/12/2023 Medical Rec #: [id number]      Accession #:    [id number] Date of Birth: [birth year]      Patient Gender: M Patient Age:   68  years Exam Location:  [facility] Procedure:      VAS US  DUPLEX DIALYSIS ACCESS (AVF, AVG) Referring Phys: CORDELLA SHAWL --------------------------------------------------------------------------------  Reason for Exam: Routine follow up. Access Site: Left Upper Extremity. Access Type: Brachial-cephalic AVF. History: Created 11/2021          Stent placed at cephalic / subclavian confluence 06/08/2023. Performing Technologist: Donnice Charnley RVT  Examination Guidelines: A complete evaluation includes B-mode imaging, spectral Doppler, color Doppler, and power Doppler as needed of all accessible portions of each vessel. Unilateral testing is considered an integral part of a complete examination. Limited examinations for reoccurring indications may be performed as noted.  Findings: +--------------------+----------+-----------------+--------+ AVF                 PSV (cm/s)Flow Vol (mL/min)Comments +--------------------+----------+-----------------+--------+ Native artery inflow   234          2520                +--------------------+----------+-----------------+--------+ AVF Anastomosis        609                              +--------------------+----------+-----------------+--------+  +---------------+----------+-------------+----------+--------+ OUTFLOW VEIN   PSV (cm/s)Diameter (cm)Depth (cm)Describe +---------------+----------+-------------+----------+--------+ Subclavian vein    85                                    +---------------+----------+-------------+----------+--------+ Confluence        282        0.73                        +---------------+----------+-------------+----------+--------+ Clavicle          214        0.78                        +---------------+----------+-------------+----------+--------+ Shoulder          148        0.74                        +---------------+----------+-------------+----------+--------+ Prox UA           172         0.60                        +---------------+----------+-------------+----------+--------+ Mid UA            172        0.67                        +---------------+----------+-------------+----------+--------+ Dist UA           167        0.84                        +---------------+----------+-------------+----------+--------+  AC Fossa          706        0.90                        +---------------+----------+-------------+----------+--------+   Summary: Patent arteriovenous fistula. *See table(s) above for measurements and observations.  Diagnosing physician: Cordella Shawl MD Electronically signed by Cordella Shawl MD on 07/14/2023 at 3:14:43 PM.   --------------------------------------------------------------------------------   Final     ECHO as above  TELEMETRY reviewed by me 07/19/2023: SR 60s, later in the AM atrial flutter rate 120s  EKG reviewed by me: Normal sinus rhythm multiple PVCs and PACs rate of 90   Data reviewed by me 07/19/2023: last 24h vitals tele labs imaging I/O hospitalist progress notes.  Principal Problem:   Acute pulmonary edema (HCC) Active Problems:   Elevated troponin   Hypertensive emergency   SOB (shortness of breath)   Hypokalemia    ASSESSMENT AND PLAN:  IVEY CINA is a 83 y.o. 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  Patient is without chest pain or palpitations.  Cardiology was consulted for further evaluation.   # New onset Atrial flutter RVR Patient without palpitations, chest pain, SOB or lightheadedness. Patient with no known history of atrial fibrillation/flutter. EKG today and per tele with atrial flutter rate 120-130s -Monitor and replenish electrolytes for a goal K >4, Mag >2  -Ordered IV amio  bolus and infusion.  -Continue metoprolol  as stated below for rate control.  -Continue heparin  gtt for stroke risk reduction. Will plan to transition IV heparin  to PO Eliquis . -CHA2DS2VASc score of 6  (in the setting of age, CHF, HTN, vascular disease, and diabetes history).   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  BNP elevated 2400 > 2000. CXR with cardiomegaly and pulmonary edema.  Echo his admission with preserved EF, grade 3 diastolic dysfunction, moderate concentric LVH.  -Monitor and replenish electrolytes for a goal K >4, Mag >2  -Nephrology consulted, appreciate recommendations. -Continue IV Lasix  40 mg every 8 hours per nephrology. -Increased metoprolol  to tartrate to 50 mg 3 times daily. -Continue spironolactone  12.5 mg daily.  Uptitrate as BP allows. -Plan to optimize GDMT as BP and renal function allows.  -Patient continues to make urine, no plan for HD per nephrology.  # Demand ischemia  # Hypertension Patient without chest pain. Trops elevated and flat 150 > 250 > 440 > 540 > 590 > 600 > 540.  EKG without acute ischemic changes.  Echo this admission with preserved EF, no RWMA. -Continue heparin  infusion for 48 hours. -Continue aspirin  81 mg daily. -Continue amlodipine  10 mg daily. -Metoprolol  stated above.  This patient's plan of care was discussed and created with Dr. Florencio and he is in agreement.  Signed: Dorene Comfort, PA-C  07/19/2023, 10:19 AM [facility]

## 2023-07-19 NOTE — Progress Notes (Signed)
 PHARMACY - ANTICOAGULATION CONSULT NOTE  Pharmacy Consult for heparin  Indication: chest pain/ACS  No Known Allergies  Patient Measurements: Height: 6' 5 (195.6 cm) Weight: 125.6 kg (276 lb 14.4 oz) IBW/kg (Calculated) : 89.1 HEPARIN  DW (KG): 113.5  Vital Signs: Temp: 99.7 F (37.6 C) (07/07 1456) BP: 136/81 (07/07 1456) Pulse Rate: 130 (07/07 1456)  Labs: Recent Labs    07/17/23 2042 07/17/23 2344 07/18/23 0342 07/18/23 0454 07/18/23 0852 07/18/23 1630 07/18/23 1735 07/18/23 2050 07/18/23 2200 07/19/23 0748 07/19/23 1600  HGB 10.1*  --   --  9.5*  --   --   --   --   --  11.1*  --   HCT 33.6*  --   --  30.8*  --   --   --   --   --  36.0*  --   PLT 187  --   --  176  --   --   --   --   --  221  --   LABPROT 17.0*  --   --   --   --   --   --   --   --   --   --   INR 1.3*  --   --   --   --   --   --   --   --   --   --   HEPARINUNFRC  --   --   --  0.17*   < >  --   --   --  0.25* 0.50 0.32  CREATININE 3.20*  --  3.00*  --   --   --   --   --   --  3.26*  3.31*  --   TROPONINIHS 154*   < > 545*  --    < > 555* 605* 547*  --   --   --    < > = values in this interval not displayed.    Estimated Creatinine Clearance: 24.8 mL/min (A) (by C-G formula based on SCr of 3.31 mg/dL (H)).   Medical History: Past Medical History:  Diagnosis Date   Anxiety    Arthritis    Depression    Diabetes mellitus without complication (HCC)    Hypertension    Pneumonia    Renal disorder    Assessment: 83 y/o male presenting with shortness of breath - found to have elevated troponin levels. PMH significant for anxiety, depression, diabetes, hypertension. Pharmacy has been consulted to initiate heparin  infusion. Per chart review, patient was not on anticoagulation prior to admission.  Baseline labs: hgb 10.1, plt 187, INR pending  7/6: HL @ 0454 = 0.17, SUBtherapeutic  7/6 1345 HL 0.41,  therapeutic x 1 7/6 2200 HL 0.25,  SUBtherapeutic  7/7 0748 HL 0.50, therapeutic x  1 7/7 1600 HL 0.32, therapeutic x 2   Goal of Therapy:  Heparin  level 0.3-0.7 units/ml Monitor platelets by anticoagulation protocol: Yes   Plan:  - Continue heparin  drip rate at 2050 units/hr - Check HL with AM labs - CBC/H&H daily  Thank you for involving pharmacy in this patient's care.   Olam Fritter, PharmD, BCPS 07/19/2023 5:35 PM

## 2023-07-19 NOTE — Progress Notes (Addendum)
 [facility] Liaison Note  Received a referral from [facility], Lauraine Carpen, LCSW, for palliative services at home when discharged.   Referral submitted today.    Please call with any palliative  related questions or concerns.  Thank you for the opportunity to participate in this patient's care  [facility] Liaison 336 [phone]

## 2023-07-19 NOTE — Plan of Care (Signed)
  Problem: Coping: Goal: Ability to adjust to condition or change in health will improve Outcome: Progressing   Problem: Fluid Volume: Goal: Ability to maintain a balanced intake and output will improve Outcome: Progressing   Problem: Health Behavior/Discharge Planning: Goal: Ability to manage health-related needs will improve Outcome: Progressing   Problem: Clinical Measurements: Goal: Ability to maintain clinical measurements within normal limits will improve Outcome: Progressing   Problem: Clinical Measurements: Goal: Diagnostic test results will improve Outcome: Progressing   Problem: Clinical Measurements: Goal: Respiratory complications will improve Outcome: Progressing

## 2023-07-20 DIAGNOSIS — J810 Acute pulmonary edema: Secondary | ICD-10-CM | POA: Diagnosis not present

## 2023-07-20 DIAGNOSIS — R7989 Other specified abnormal findings of blood chemistry: Secondary | ICD-10-CM | POA: Diagnosis not present

## 2023-07-20 DIAGNOSIS — I161 Hypertensive emergency: Secondary | ICD-10-CM | POA: Diagnosis not present

## 2023-07-20 DIAGNOSIS — E876 Hypokalemia: Secondary | ICD-10-CM | POA: Diagnosis not present

## 2023-07-20 LAB — CBC
HCT: 31.2 % — ABNORMAL LOW (ref 39.0–52.0)
Hemoglobin: 10 g/dL — ABNORMAL LOW (ref 13.0–17.0)
MCH: 26.5 pg (ref 26.0–34.0)
MCHC: 32.1 g/dL (ref 30.0–36.0)
MCV: 82.5 fL (ref 80.0–100.0)
Platelets: 193 K/uL (ref 150–400)
RBC: 3.78 MIL/uL — ABNORMAL LOW (ref 4.22–5.81)
RDW: 16.6 % — ABNORMAL HIGH (ref 11.5–15.5)
WBC: 7.9 K/uL (ref 4.0–10.5)
nRBC: 0 % (ref 0.0–0.2)

## 2023-07-20 LAB — BASIC METABOLIC PANEL WITH GFR
Anion gap: 10 (ref 5–15)
BUN: 51 mg/dL — ABNORMAL HIGH (ref 8–23)
CO2: 28 mmol/L (ref 22–32)
Calcium: 8.8 mg/dL — ABNORMAL LOW (ref 8.9–10.3)
Chloride: 105 mmol/L (ref 98–111)
Creatinine, Ser: 3.69 mg/dL — ABNORMAL HIGH (ref 0.61–1.24)
GFR, Estimated: 16 mL/min — ABNORMAL LOW (ref >60)
Glucose, Bld: 89 mg/dL (ref 70–99)
Potassium: 3.6 mmol/L (ref 3.5–5.1)
Sodium: 143 mmol/L (ref 135–145)

## 2023-07-20 LAB — PHOSPHORUS: Phosphorus: 3 mg/dL (ref 2.5–4.6)

## 2023-07-20 LAB — TSH: TSH: 1.633 u[IU]/mL (ref 0.350–4.500)

## 2023-07-20 LAB — HEPATITIS B CORE ANTIBODY, TOTAL: HEP B CORE AB: NEGATIVE

## 2023-07-20 LAB — GLUCOSE, CAPILLARY
Glucose-Capillary: 84 mg/dL (ref 70–99)
Glucose-Capillary: 92 mg/dL (ref 70–99)
Glucose-Capillary: 95 mg/dL (ref 70–99)

## 2023-07-20 LAB — HEPARIN LEVEL (UNFRACTIONATED): Heparin Unfractionated: <0.1 [IU]/mL — ABNORMAL LOW (ref 0.30–0.70)

## 2023-07-20 LAB — MAGNESIUM: Magnesium: 2.2 mg/dL (ref 1.7–2.4)

## 2023-07-20 MED ORDER — SENNOSIDES-DOCUSATE SODIUM 8.6-50 MG PO TABS: 2.0000 | ORAL_TABLET | Freq: Every evening | ORAL | Status: DC | PRN

## 2023-07-20 MED ORDER — APIXABAN 2.5 MG PO TABS
2.5000 mg | ORAL_TABLET | Freq: Two times a day (BID) | ORAL | Status: DC
  Administered 2023-07-20 – 2023-07-24 (×9): 2.5 mg via ORAL
  Filled 2023-07-20 (×9): qty 1

## 2023-07-20 MED ORDER — HEPARIN BOLUS VIA INFUSION
3400.0000 [IU] | Freq: Once | INTRAVENOUS | Status: AC
  Administered 2023-07-20: 3400 [IU] via INTRAVENOUS
  Filled 2023-07-20: qty 3400

## 2023-07-20 MED ORDER — AZITHROMYCIN 250 MG PO TABS
500.0000 mg | ORAL_TABLET | Freq: Every day | ORAL | Status: AC
  Administered 2023-07-20 – 2023-07-21 (×2): 500 mg via ORAL
  Filled 2023-07-20 (×2): qty 2

## 2023-07-20 MED ORDER — AMIODARONE HCL IN DEXTROSE 360-4.14 MG/200ML-% IV SOLN
30.0000 mg/h | INTRAVENOUS | Status: DC
  Administered 2023-07-20 – 2023-07-21 (×3): 30 mg/h via INTRAVENOUS
  Filled 2023-07-20 (×2): qty 200

## 2023-07-20 MED ORDER — AMIODARONE HCL IN DEXTROSE 360-4.14 MG/200ML-% IV SOLN
60.0000 mg/h | INTRAVENOUS | Status: DC
  Administered 2023-07-20 (×2): 60 mg/h via INTRAVENOUS
  Filled 2023-07-20 (×2): qty 200

## 2023-07-20 MED ORDER — POLYETHYLENE GLYCOL 3350 17 G PO PACK: 17.0000 g | PACK | Freq: Every day | ORAL | Status: DC | PRN

## 2023-07-20 MED ORDER — HYDRALAZINE HCL 50 MG PO TABS: 50.0000 mg | ORAL_TABLET | Freq: Three times a day (TID) | ORAL | Status: DC

## 2023-07-20 MED ORDER — SODIUM CHLORIDE 0.9 % IV SOLN
2.0000 g | INTRAVENOUS | Status: AC
  Administered 2023-07-20 – 2023-07-21 (×2): 2 g via INTRAVENOUS
  Filled 2023-07-20 (×2): qty 20

## 2023-07-20 MED ORDER — AMIODARONE LOAD VIA INFUSION
150.0000 mg | Freq: Once | INTRAVENOUS | Status: AC
  Administered 2023-07-20: 150 mg via INTRAVENOUS
  Filled 2023-07-20: qty 83.34

## 2023-07-20 MED ORDER — POTASSIUM CHLORIDE CRYS ER 20 MEQ PO TBCR
40.0000 meq | EXTENDED_RELEASE_TABLET | Freq: Once | ORAL | Status: AC
  Administered 2023-07-20: 40 meq via ORAL
  Filled 2023-07-20: qty 2

## 2023-07-20 NOTE — Progress Notes (Signed)
 PHARMACY - ANTICOAGULATION CONSULT NOTE  Pharmacy Consult for heparin  Indication: chest pain/ACS  No Known Allergies  Patient Measurements: Height: 6' 5 (195.6 cm) Weight: 125.3 kg (276 lb 3.8 oz) IBW/kg (Calculated) : 89.1 HEPARIN  DW (KG): 113.5  Vital Signs: Temp: 97.5 F (36.4 C) (07/08 0507) Temp Source: Oral (07/07 2340) BP: 140/75 (07/08 0507) Pulse Rate: 55 (07/08 0507)  Labs: Recent Labs    07/17/23 2042 07/17/23 2344 07/18/23 0342 07/18/23 0454 07/18/23 0852 07/18/23 1630 07/18/23 1735 07/18/23 2050 07/18/23 2200 07/19/23 0748 07/19/23 1600 07/20/23 0447  HGB 10.1*  --   --  9.5*  --   --   --   --   --  11.1*  --  10.0*  HCT 33.6*  --   --  30.8*  --   --   --   --   --  36.0*  --  31.2*  PLT 187  --   --  176  --   --   --   --   --  221  --  193  LABPROT 17.0*  --   --   --   --   --   --   --   --   --   --   --   INR 1.3*  --   --   --   --   --   --   --   --   --   --   --   HEPARINUNFRC  --   --   --  0.17*   < >  --   --   --    < > 0.50 0.32 <0.10*  CREATININE 3.20*  --  3.00*  --   --   --   --   --   --  3.26*  3.31*  --  3.69*  TROPONINIHS 154*   < > 545*  --    < > 555* 605* 547*  --   --   --   --    < > = values in this interval not displayed.    Estimated Creatinine Clearance: 22.2 mL/min (A) (by C-G formula based on SCr of 3.69 mg/dL (H)).   Medical History: Past Medical History:  Diagnosis Date   Anxiety    Arthritis    Depression    Diabetes mellitus without complication (HCC)    Hypertension    Pneumonia    Renal disorder    Assessment: 83 y/o male presenting with shortness of breath - found to have elevated troponin levels. PMH significant for anxiety, depression, diabetes, hypertension. Pharmacy has been consulted to initiate heparin  infusion. Per chart review, patient was not on anticoagulation prior to admission.  Baseline labs: hgb 10.1, plt 187, INR pending  7/6: HL @ 0454 = 0.17, SUBtherapeutic  7/6 1345 HL 0.41,   therapeutic x 1 7/6 2200 HL 0.25,  SUBtherapeutic  7/7 0748 HL 0.50, therapeutic x 1 7/7 1600 HL 0.32, therapeutic x 2 7/8 0447 HL <0.1, subtherapeutic   Goal of Therapy:  Heparin  level 0.3-0.7 units/ml Monitor platelets by anticoagulation protocol: Yes   Plan:  - Bolus 3400 units x 1 - Increase heparin  drip rate to 2350 units/hr - Recheck HL in 8 hrs after rate change - CBC/H&H daily  Thank you for involving pharmacy in this patient's care.   Rankin CANDIE Dills, PharmD, MBA 07/20/2023 7:01 AM

## 2023-07-20 NOTE — TOC Progression Note (Addendum)
 Transition of Care [facility]) - Progression Note    Patient Details  Name: Jeffery Joseph MRN: [id number] Date of Birth: 06/22/1940  Transition of Care [facility]) CM/SW Contact  Tomasa JAYSON Childes, RN Phone Number: 07/20/2023, 9:58 AM  Clinical Narrative:    Spoke with patient's daughter Charlies regarding discharge plan. She is requesting a hospital bed, bari walker and a BSC. She is agreeable to palliative services and would like Civil engineer, contracting. The family also wishes to have HH. Charlies was provided choices for [facility], [facility], and [facility]. She stated her sister, Dava would have to provided choice.   Attempt to reach Dava. No answer. Left a message.  MD notified of requested DME.    Expected Discharge Plan: Home/Self Care Barriers to Discharge: Continued Medical Work up  Expected Discharge Plan and Services     Post Acute Care Choice: NA Living arrangements for the past 2 months: Single Family Home                                       Social Determinants of Health (SDOH) Interventions SDOH Screenings   Food Insecurity: No Food Insecurity (07/18/2023)  Housing: Low Risk  (07/18/2023)  Transportation Needs: No Transportation Needs (07/18/2023)  Utilities: Not At Risk (07/18/2023)  Financial Resource Strain: Low Risk  (11/25/2022)   Received from [facility]  Social Connections: Socially Integrated (07/18/2023)  Tobacco Use: Low Risk  (07/18/2023)    Readmission Risk Interventions    07/19/2023   11:05 AM  Readmission Risk Prevention Plan  Transportation Screening Complete  PCP or Specialist Appt within 3-5 Days Complete  Social Work Consult for Recovery Care Planning/Counseling Complete  Palliative Care Screening Not Applicable  Medication Review [employer]) Complete

## 2023-07-20 NOTE — Progress Notes (Addendum)
 1      PROGRESS NOTE    Jeffery Joseph  FMW:[id number] DOB: 06/28/1940 DOA: 07/17/2023 PCP: Epifanio Alm SQUIBB, MD    Brief Narrative:   83 y.o. 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, T2DM, HTN, PAD, arthritis, anxiety and depression, anemia of chronic disease, chronic hyperkalemia and BPH who presented to the ED for evaluation of shortness of breath and admitted for acute pulmonary edema   7/6: Cardiology and nephrology consult 7/7: transfer to PCU, amio started 7/8: PT, OT eval DME ordered for home, stopping hydralazine  as blood pressure running low, Eliquis  started   Assessment & Plan:   Principal Problem:   Acute pulmonary edema (HCC) Active Problems:   Elevated troponin   Hypertensive emergency   SOB (shortness of breath)   Hypokalemia   # Acute pulmonary edema in the setting of hypertensive emergency # Acute on chronic HFpEF  - Patient with 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  Was started on empiric Rocephin  and Zithromax  for possible pneumonia.  Complete 5 days of course.  Day 3 of 5  New onset A.flutter/RVR HR in 120-130s, asymptomatic - transfer to PCU - Initially loaded with IV amiodarone  and on infusion -> switched to PO on 7/7 - Amiodarone  infusion restarted today as his heart rate started going up again this morning. -Continue metoprolol   -Heparin  drip discontinued-> transition to Eliquis  today -Cardiology team has discussed with EP team who recommends outpatient follow-up for ablation   # Hypertensive emergency # Acute symptomatic  hypertension - Found to have SBP in the 170s-190s on admission with evidence of acute pulmonary edema - SBP improved to the 120s to 130s after IV Lasix  and resuming home BP meds - Continue Lopressor , enalapril , spironolactone   - echocardiogram shows normal LV function -Stopping hydralazine  and amlodipine  as blood pressure is running low now.   # Elevated troponin - Patient found to have troponin of 154 on admission.  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  Continue IV Lasix  40 mg every 8 hours   # T2DM - Hemoglobin A1c of 5.2 - Continue glipizide  - SSI with meals, CBG monitoring   # Anemia of chronic disease - Hgb stable at 10 - Trend CBC   # PAD - Continue Plavix .  Stopping aspirin  as started on Eliquis    # BPH - Continue finasteride  and tamsulosin    # Anxiety and depression - Continue as needed Xanax    # Dementia - Continue Aricept   # Constipation Resolved.  Changed to as needed Senokot-S  and MiraLAX   Weakness Evaluated by PT.  DME orders have been placed for hospital bed, bari walker and a BSC   DVT prophylaxis: Eliquis  apixaban  (ELIQUIS ) tablet 2.5 mg Start: 07/20/23 1245 apixaban  (ELIQUIS ) tablet 2.5 mg     Code Status: DNR Family Communication: Discussed with patient's daughter/Dava and other daughter at bedside Disposition Plan: Possible discharge in next 2 to 3 days depending on cardiology, nephrology workup and his clinical  condition   Consultants:  Cardiology Nephrology   Subjective:  Heart rate went up again this morning, remained asymptomatic.  We started amiodarone  drip.  Constipation resolved.  He had a good bowel movement today.   Objective: Vitals:   07/20/23 1000 07/20/23 1006 07/20/23 1505 07/20/23 1548  BP: 116/64 111/68 (!) 95/47 (!) 97/57  Pulse: (!) 124 (!) 123 (!) 57 67  Resp:   20   Temp: 98.2 F (36.8 C)  98.4 F (36.9 C)   TempSrc: Oral  Oral   SpO2: 97%  93%   Weight:      Height:        Intake/Output Summary (Last 24 hours) at 07/20/2023 1602 Last data filed at 07/20/2023 1331 Gross per 24 hour  Intake 1313.99 ml  Output 2100 ml  Net -786.01 ml   Filed Weights   07/17/23 2028 07/19/23 0556 07/20/23 0437  Weight: 118.4 kg 125.6 kg 125.3 kg    Examination:  General exam: Appears calm and comfortable  Respiratory system: Clear to auscultation. Respiratory effort normal. Cardiovascular system: S1 & S2 heard, RRR.  1+ pedal edema Gastrointestinal system: Abdomen is soft, benign,  Central nervous system: Alert and oriented. No focal neurological deficits. Extremities: Symmetric 5 x 5 power. Skin: No rashes, lesions or ulcers Psychiatry: Judgement and insight appear normal. Mood & affect appropriate.  Left AV fistula for dialysis access   Data Reviewed: I have personally reviewed following labs and imaging studies  CBC: Recent Labs  Lab 07/17/23 2042 07/18/23 0454 07/19/23 0748 07/20/23 0447  WBC 7.5 6.7 8.1 7.9  HGB 10.1* 9.5* 11.1* 10.0*  HCT 33.6* 30.8* 36.0* 31.2*  MCV 85.1 83.9 83.3 82.5  PLT 187 176 221 193   Basic Metabolic Panel: Recent Labs  Lab 07/17/23 2042 07/18/23 0342 07/19/23 0748 07/20/23 0447  NA 143 142 144  143 143  K 3.3* 3.2* 3.6  3.6 3.6  CL 110 109 104  103 105  CO2 23 23 27  26 28   GLUCOSE 122* 110* 94  93 89  BUN 40* 39* 43*  42* 51*  CREATININE 3.20* 3.00* 3.26*  3.31* 3.69*  CALCIUM 8.8* 8.4* 9.1  9.1 8.8*  MG   --   --  2.0 2.2  PHOS  --  2.9  --  3.0   GFR: Estimated Creatinine Clearance: 22.2 mL/min (A) (by C-G formula based on SCr of 3.69 mg/dL (H)). Liver Function Tests: Recent Labs  Lab 07/17/23 2042 07/18/23 0342  AST 23  --   ALT 35  --   ALKPHOS 54  --   BILITOT 1.0  --   PROT 6.6  --   ALBUMIN 3.2* 2.9*   No results for input(s): LIPASE, AMYLASE in the last 168 hours. No results for input(s): AMMONIA in the last 168 hours. Coagulation Profile: Recent Labs  Lab 07/17/23 2042  INR 1.3*   Cardiac Enzymes: No results for input(s): CKTOTAL, CKMB, CKMBINDEX, TROPONINI in the last 168 hours. BNP (last 3 results) No results for input(s): PROBNP in the  last 8760 hours. HbA1C: Recent Labs    07/18/23 0454  HGBA1C 5.2   CBG: Recent Labs  Lab 07/19/23 0807 07/19/23 1148 07/19/23 1745 07/19/23 2149 07/20/23 0823  GLUCAP 93 99 107* 137* 84   Lipid Profile: No results for input(s): CHOL, HDL, LDLCALC, TRIG, CHOLHDL, LDLDIRECT in the last 72 hours. Thyroid Function Tests: Recent Labs    07/20/23 0446  TSH 1.633   Anemia Panel: No results for input(s): VITAMINB12, FOLATE, FERRITIN, TIBC, IRON, RETICCTPCT in the last 72 hours. Sepsis Labs: Recent Labs  Lab 07/17/23 2042 07/17/23 2344 07/18/23 1902 07/18/23 2200  LATICACIDVEN 1.2 1.2 1.0 0.8    Recent Results (from the past 240 hours)  Resp panel by RT-PCR (RSV, Flu A&B, Covid) Anterior Nasal Swab     Status: None   Collection Time: 07/17/23  8:42 PM   Specimen: Anterior Nasal Swab  Result Value Ref Range Status   SARS Coronavirus 2 by RT PCR NEGATIVE NEGATIVE Final    Comment: (NOTE) SARS-CoV-2 target nucleic acids are NOT DETECTED.  The SARS-CoV-2 RNA is generally detectable in upper respiratory specimens during the acute phase of infection. The lowest concentration of SARS-CoV-2 viral copies this assay can detect is 138 copies/mL. A negative result does not  preclude SARS-Cov-2 infection and should not be used as the sole basis for treatment or other patient management decisions. A negative result may occur with  improper specimen collection/handling, submission of specimen other than nasopharyngeal swab, presence of viral mutation(s) within the areas targeted by this assay, and inadequate number of viral copies(<138 copies/mL). A negative result must be combined with clinical observations, patient history, and epidemiological information. The expected result is Negative.  Fact Sheet for Patients:  BloggerCourse.com  Fact Sheet for Healthcare Providers:  SeriousBroker.it  This test is no t yet approved or cleared by the [place]  FDA and  has been authorized for detection and/or diagnosis of SARS-CoV-2 by FDA under an Emergency Use Authorization (EUA). This EUA will remain  in effect (meaning this test can be used) for the duration of the COVID-19 declaration under Section 564(b)(1) of the Act, 21 U.S.C.section 360bbb-3(b)(1), unless the authorization is terminated  or revoked sooner.       Influenza A by PCR NEGATIVE NEGATIVE Final   Influenza B by PCR NEGATIVE NEGATIVE Final    Comment: (NOTE) The Xpert Xpress SARS-CoV-2/FLU/RSV plus assay is intended as an aid in the diagnosis of influenza from Nasopharyngeal swab specimens and should not be used as a sole basis for treatment. Nasal washings and aspirates are unacceptable for Xpert Xpress SARS-CoV-2/FLU/RSV testing.  Fact Sheet for Patients: BloggerCourse.com  Fact Sheet for Healthcare Providers: SeriousBroker.it  This test is not yet approved or cleared by the [place]  FDA and has been authorized for detection and/or diagnosis of SARS-CoV-2 by FDA under an Emergency Use Authorization (EUA). This EUA will remain in effect (meaning this test can be used) for the  duration of the COVID-19 declaration under Section 564(b)(1) of the Act, 21 U.S.C. section 360bbb-3(b)(1), unless the authorization is terminated or revoked.     Resp Syncytial Virus by PCR NEGATIVE NEGATIVE Final    Comment: (NOTE) Fact Sheet for Patients: BloggerCourse.com  Fact Sheet for Healthcare Providers: SeriousBroker.it  This test is not yet approved or cleared by the [place]  FDA and has been authorized for detection and/or diagnosis of SARS-CoV-2 by FDA under an Emergency Use Authorization (EUA). This EUA will remain in effect (meaning this test can be  used) for the duration of the COVID-19 declaration under Section 564(b)(1) of the Act, 21 U.S.C. section 360bbb-3(b)(1), unless the authorization is terminated or revoked.  Performed at [facility], [address] [place], [place] [address]   Blood culture (routine x 2)     Status: None (Preliminary result)   Collection Time: 07/17/23  8:42 PM   Specimen: BLOOD  Result Value Ref Range Status   Specimen Description BLOOD RIGHT FOREARM  Final   Special Requests   Final    BOTTLES DRAWN AEROBIC AND ANAEROBIC Blood Culture adequate volume   Culture   Final    NO GROWTH 3 DAYS Performed at [facility], [address] [place], [place] [address]    Report Status PENDING  Incomplete  Blood culture (routine x 2)     Status: None (Preliminary result)   Collection Time: 07/17/23 10:33 PM   Specimen: BLOOD  Result Value Ref Range Status   Specimen Description BLOOD RIGHT ASSIST CONTROL  Final   Special Requests   Final    BOTTLES DRAWN AEROBIC AND ANAEROBIC Blood Culture results may not be optimal due to an inadequate volume of blood received in culture bottles   Culture   Final    NO GROWTH 3 DAYS Performed at [facility], [address] [place], [place] [address]    Report Status PENDING  Incomplete         Radiology  Studies: DG Chest 2 View Result Date: 07/19/2023 CLINICAL DATA:  [id number] Dyspnea [id number] EXAM: CHEST - 2 VIEW COMPARISON:  July 18, 2023 FINDINGS: Lower lung volumes. Bilateral perihilar interstitial opacities throughout both lungs. Hazy airspace opacities within the lung bases with blunting of the costophrenic sulci. No pneumothorax. Mild cardiomegaly. Tortuous aorta with aortic atherosclerosis. No acute fracture or destructive lesions. Multilevel thoracic osteophytosis. IMPRESSION: Lower lung volumes. Redemonstrated cardiomegaly with findings of pulmonary edema. Increasing hazy airspace opacities within the lung bases, possibly due to pleural effusions and atelectasis or worsening edema. Electronically Signed   By: Rogelia Myers M.D.   On: 07/19/2023 08:22   DG Chest Port 1 View Result Date: 07/18/2023 CLINICAL DATA:  Dyspnea EXAM: PORTABLE CHEST 1 VIEW COMPARISON:  07/17/2023 FINDINGS: Cardiomegaly with vascular congestion and mild diffuse interstitial and ground-glass opacity, improved compared with 07/17/2023. Small bilateral effusions. No pneumothorax. Stent in the left axillary region IMPRESSION: Cardiomegaly with vascular congestion and edema, improved compared with 07/17/2023. Small bilateral effusions. Electronically Signed   By: Luke Bun M.D.   On: 07/18/2023 20:14        Scheduled Meds:  ALPRAZolam   0.5 mg Oral BID   apixaban   2.5 mg Oral BID   cholecalciferol   5,000 Units Oral Daily   clopidogrel   75 mg Oral Daily   donepezil   10 mg Oral QHS   enalapril   20 mg Oral BID   finasteride   5 mg Oral Daily   furosemide   40 mg Intravenous Q8H   glipiZIDE   5 mg Oral Q breakfast   hydrALAZINE   50 mg Oral TID   insulin  aspart  0-6 Units Subcutaneous TID WC   metoprolol  tartrate  12.5 mg Oral TID   sodium bicarbonate   650 mg Oral BID   spironolactone   12.5 mg Oral Daily   tamsulosin   0.4 mg Oral Daily   Continuous Infusions:  amiodarone  30 mg/hr (07/20/23 1526)   azithromycin   (ZITHROMAX ) 500 mg in sodium chloride  0.9 % 250 mL IVPB 500 mg (07/19/23 2335)   cefTRIAXone  (  ROCEPHIN )  IV 1 g (07/19/23 2014)     LOS: 3 days    Time spent: 35 minutes    Sherena Machorro Maree, MD [facility] Pager [phone]  If 7PM-7AM, please contact night-coverage [email or web address]  07/20/2023, 4:02 PM

## 2023-07-20 NOTE — Progress Notes (Signed)
 Physical Therapy Treatment Patient Details Name: Jeffery Joseph MRN: [id number] DOB: [birth year] Today's Date: 07/20/2023   History of Present Illness 83 y/o male presented to ED on 07/17/23 for SOB. Admitted for acute pulmonary edema. PMH: ESRD, HTN, T2DM, PAD, anxiety, depression, dementia    PT Comments  Patient alert, agreeable to PT, reported Ble foot pain, mild symptoms with mobility. Able to perform bed mobility with supervision, and sat with fair balance for a few minutes. Sit <> stand with RW and CGA, noted for wide BOS, verbal cues for hand placement. Initally mildly unsteady with minA to steady but over time improved gait speed and cadence, though cues needed for pacing/RW management as well. Noted for R knee deficits (occasional stagger or unsteadiness), pt stated this is chronic. The patient would benefit from further skilled PT intervention to continue to progress towards goals.    If plan is discharge home, recommend the following: A little help with walking and/or transfers;A little help with bathing/dressing/bathroom;Assistance with cooking/housework;Direct supervision/assist for medications management;Direct supervision/assist for financial management;Assist for transportation;Help with stairs or ramp for entrance   Can travel by private vehicle        Equipment Recommendations  Rolling walker (2 wheels)    Recommendations for Other Services       Precautions / Restrictions Precautions Precautions: Fall Recall of Precautions/Restrictions: Impaired Precaution/Restrictions Comments: fall risk Restrictions Weight Bearing Restrictions Per Provider Order: No     Mobility  Bed Mobility Overal bed mobility: Needs Assistance Bed Mobility: Supine to Sit, Sit to Supine     Supine to sit: Supervision Sit to supine: Supervision        Transfers Overall transfer level: Needs assistance Equipment used: Rolling walker (2 wheels) Transfers: Sit to/from Stand Sit to Stand:  Contact guard assist           General transfer comment: cues for hand placement    Ambulation/Gait Ambulation/Gait assistance: Min assist, Contact guard assist Gait Distance (Feet): 100 Feet     Gait velocity: decreased     General Gait Details: initially mildly unsteady but improved with time. wide BOS, chronic R knee issues. cues for pacing needed   Stairs             Wheelchair Mobility     Tilt Bed    Modified Rankin (Stroke Patients Only)       Balance Overall balance assessment: Needs assistance Sitting-balance support: Single extremity supported, Feet supported, Bilateral upper extremity supported Sitting balance-Leahy Scale: Fair     Standing balance support: During functional activity, Bilateral upper extremity supported Standing balance-Leahy Scale: Poor                              Communication Communication Communication: No apparent difficulties  Cognition Arousal: Alert Behavior During Therapy: WFL for tasks assessed/performed   PT - Cognitive impairments: No family/caregiver present to determine baseline                         Following commands: Intact      Cueing Cueing Techniques: Verbal cues, Tactile cues  Exercises      General Comments        Pertinent Vitals/Pain Pain Assessment Pain Assessment: Faces Faces Pain Scale: Hurts a little bit Pain Location: B feet Pain Descriptors / Indicators: Sore Pain Intervention(s): Monitored during session    Home Living  Prior Function            PT Goals (current goals can now be found in the care plan section) Progress towards PT goals: Progressing toward goals    Frequency    Min 2X/week      PT Plan      Co-evaluation              AM-PAC PT 6 Clicks Mobility   Outcome Measure  Help needed turning from your back to your side while in a flat bed without using bedrails?: A Little Help needed  moving from lying on your back to sitting on the side of a flat bed without using bedrails?: A Little Help needed moving to and from a bed to a chair (including a wheelchair)?: A Little Help needed standing up from a chair using your arms (e.g., wheelchair or bedside chair)?: A Little Help needed to walk in hospital room?: A Little Help needed climbing 3-5 steps with a railing? : A Little 6 Click Score: 18    End of Session Equipment Utilized During Treatment: Gait belt Activity Tolerance: Patient tolerated treatment well Patient left: in bed;with call bell/phone within reach;with bed alarm set Nurse Communication: Mobility status PT Visit Diagnosis: Unsteadiness on feet (R26.81);Muscle weakness (generalized) (M62.81)     Time: 8584-8566 PT Time Calculation (min) (ACUTE ONLY): 18 min  Charges:    $Therapeutic Activity: 8-22 mins PT General Charges $$ ACUTE PT VISIT: 1 Visit                     Doyal Shams PT, DPT 2:49 PM,07/20/23

## 2023-07-20 NOTE — Progress Notes (Addendum)
 [facility] CLINIC CARDIOLOGY PROGRESS NOTE       Patient ID: Jeffery Joseph MRN: [id number] DOB/AGE: [birth year] 83 y.o.  Admit date: 07/17/2023 Referring Physician Dr. Lou Primary Physician Epifanio Alm SQUIBB, MD Primary Cardiologist None Reason for Consultation SOB, elevated trops  HPI: Jeffery Joseph is a 83 y.o. 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  Patient is without chest pain or palpitations.  Cardiology was consulted for further evaluation.    Interval History: -Patient seen and examined this AM and laying comfortably in hospital bed with daughter at beside. Patient states he feels good this morning and states he feels like his normal self and states breathing is back to normal. Continues to deny any chest pain or palpitations. -Continues to have 2+ pedal edema. -Patients BP stable and HR elevated this AM. Per tele this AM atrial flutter, rate 120s -Yesterday UOP 2.45L, light yellow color urine. Increased Cr 3.26 > 3.69 (Nephro following) -Patient remains on room air with stable SpO2.  -No fever this AM. -Discussed patient with EP, recommend outpatient follow-up for ablation.    Review of systems complete and found to be negative unless listed above    Past Medical History:  Diagnosis Date   Anxiety    Arthritis    Depression    Diabetes mellitus without complication (HCC)    Hypertension    Pneumonia    Renal disorder     Past Surgical History:  Procedure Laterality Date   A/V FISTULAGRAM Left 06/08/2023   Procedure: A/V Fistulagram;  Surgeon: Jama Cordella MATSU, MD;  Location: [facility] INVASIVE CV LAB;  Service: Cardiovascular;  Laterality: Left;   APPLICATION OF WOUND VAC Right 11/14/2021   Procedure: APPLICATION OF WOUND VAC;  Surgeon:  Jama Cordella MATSU, MD;  Location: [facility] ORS;  Service: Vascular;  Laterality: Right;   AV FISTULA PLACEMENT Left 11/14/2021   Procedure: ARTERIOVENOUS (AV) FISTULA CREATION ( BRACHIAL CEPHALIC);  Surgeon: Jama Cordella MATSU, MD;  Location: [facility] ORS;  Service: Vascular;  Laterality: Left;    Medications Prior to Admission  Medication Sig Dispense Refill Last Dose/Taking   acetaminophen  (TYLENOL ) 325 MG tablet Take 650 mg by mouth every 6 (six) hours as needed.   Unknown   ALPRAZolam  (XANAX ) 0.5 MG tablet Take 0.5 mg by mouth 3 (three) times daily as needed for sleep.   07/17/2023 Morning   amLODipine  (NORVASC ) 10 MG tablet Take 10 mg by mouth daily.   07/17/2023 Morning   aspirin  EC 81 MG tablet Take 81 mg by mouth daily. Swallow whole.   07/17/2023 Morning   Cholecalciferol  125 MCG (5000 UT) TABS Take 5,000 Units by mouth daily.   07/17/2023 Morning   clopidogrel  (PLAVIX ) 75 MG tablet Take 1 tablet (75 mg total) by mouth daily. 30 tablet 11 07/17/2023 Morning   Docusate Sodium  (DSS) 100 MG CAPS Take 100 mg by mouth at bedtime.   07/16/2023 Bedtime   donepezil  (ARICEPT ) 10 MG tablet Take 10 mg by mouth at bedtime.   07/16/2023 Bedtime   enalapril  (VASOTEC ) 20 MG tablet Take 20 mg by mouth 2 (two) times daily.   07/17/2023 Evening   finasteride  (PROSCAR ) 5 MG tablet Take 1 tablet (5 mg total) by mouth daily. 90 tablet 3 07/17/2023 Morning   glipiZIDE  (  GLUCOTROL  XL) 5 MG 24 hr tablet Take by mouth.   07/17/2023 Morning   hydrALAZINE  (APRESOLINE ) 50 MG tablet Take 100 mg by mouth 3 (three) times daily.   07/17/2023 Evening   LOKELMA 10 g PACK packet Take 1 packet by mouth daily.   07/17/2023 Morning   metoprolol  tartrate (LOPRESSOR ) 25 MG tablet Take 25 mg by mouth 2 (two) times daily in the am and at bedtime..   07/17/2023 Morning   sodium bicarbonate  650 MG tablet Take 650 mg by mouth 2 (two) times daily.   07/17/2023 Evening   tamsulosin  (FLOMAX ) 0.4 MG CAPS capsule Take 1 capsule (0.4 mg total) by mouth daily. 90 capsule 3  07/17/2023 Morning   Patiromer Sorbitex Calcium 16.8 g PACK Take by mouth. (Patient not taking: Reported on 07/17/2023)   Not Taking   Social History   Socioeconomic History   Marital status: Married    Spouse name: ,Loraine   Number of children: Not on file   Years of education: Not on file   Highest education level: Not on file  Occupational History   Not on file  Tobacco Use   Smoking status: Never   Smokeless tobacco: Never  Substance and Sexual Activity   Alcohol use: No   Drug use: Not Currently   Sexual activity: Not Currently  Other Topics Concern   Not on file  Social History Narrative   Not on file   Social Drivers of Health   Financial Resource Strain: Low Risk  (11/25/2022)   Received from [facility]   Overall Financial Resource Strain (CARDIA)    Difficulty of Paying Living Expenses: Not hard at all  Food Insecurity: No Food Insecurity (07/18/2023)   Hunger Vital Sign    Worried About Running Out of Food in the Last Year: Never true    Ran Out of Food in the Last Year: Never true  Transportation Needs: No Transportation Needs (07/18/2023)   PRAPARE - Administrator, Civil Service (Medical): No    Lack of Transportation (Non-Medical): No  Physical Activity: Not on file  Stress: Not on file  Social Connections: Socially Integrated (07/18/2023)   Social Connection and Isolation Panel    Frequency of Communication with Friends and Family: More than three times a week    Frequency of Social Gatherings with Friends and Family: More than three times a week    Attends Religious Services: More than 4 times per year    Active Member of [employer] or Organizations: Yes    Attends Banker Meetings: More than 4 times per year    Marital Status: Married  Catering manager Violence: Not At Risk (07/18/2023)   Humiliation, Afraid, Rape, and Kick questionnaire    Fear of Current or Ex-Partner: No    Emotionally Abused: No    Physically Abused:  No    Sexually Abused: No    History reviewed. No pertinent family history.   Vitals:   07/20/23 0507 07/20/23 0908 07/20/23 1000 07/20/23 1006  BP: (!) 140/75 116/64 116/64 111/68  Pulse: (!) 55 (!) 123 (!) 124 (!) 123  Resp: 18 20    Temp: (!) 97.5 F (36.4 C) 98.2 F (36.8 C) 98.2 F (36.8 C)   TempSrc:  Oral Oral   SpO2: 94% 93% 97%   Weight:      Height:        PHYSICAL EXAM General: Well-appearing elderly male, well nourished, in no acute distress. HEENT:  Normocephalic and atraumatic. Neck: No JVD.   Lungs: Normal respiratory effort on room air. Clear bilaterally to auscultation. No wheezes, crackles, rhonchi.  Heart: Irregular, elevated rates. Normal S1 and S2 without gallops or murmurs.  Abdomen: Non-distended appearing.  Msk: Normal strength and tone for age. Extremities: Warm and well perfused. No clubbing, cyanosis. 2+ pedal edema.  Neuro: Alert and oriented X 3. Psych: Answers questions appropriately.  Labs: Basic Metabolic Panel: Recent Labs    07/18/23 0342 07/19/23 0748 07/20/23 0447  NA 142 144  143 143  K 3.2* 3.6  3.6 3.6  CL 109 104  103 105  CO2 23 27  26 28   GLUCOSE 110* 94  93 89  BUN 39* 43*  42* 51*  CREATININE 3.00* 3.26*  3.31* 3.69*  CALCIUM 8.4* 9.1  9.1 8.8*  MG  --  2.0 2.2  PHOS 2.9  --  3.0   Liver Function Tests: Recent Labs    07/17/23 2042 07/18/23 0342  AST 23  --   ALT 35  --   ALKPHOS 54  --   BILITOT 1.0  --   PROT 6.6  --   ALBUMIN 3.2* 2.9*   No results for input(s): LIPASE, AMYLASE in the last 72 hours. CBC: Recent Labs    07/19/23 0748 07/20/23 0447  WBC 8.1 7.9  HGB 11.1* 10.0*  HCT 36.0* 31.2*  MCV 83.3 82.5  PLT 221 193   Cardiac Enzymes: Recent Labs    07/18/23 1630 07/18/23 1735 07/18/23 2050  TROPONINIHS 555* 605* 547*   BNP: Recent Labs    07/17/23 2042 07/19/23 0748  BNP 2,410.3* 2,062.1*   D-Dimer: No results for input(s): DDIMER in the last 72 hours. Hemoglobin  A1C: Recent Labs    07/18/23 0454  HGBA1C 5.2   Fasting Lipid Panel: No results for input(s): CHOL, HDL, LDLCALC, TRIG, CHOLHDL, LDLDIRECT in the last 72 hours. Thyroid Function Tests: Recent Labs    07/20/23 0446  TSH 1.633   Anemia Panel: No results for input(s): VITAMINB12, FOLATE, FERRITIN, TIBC, IRON, RETICCTPCT in the last 72 hours.   Radiology: DG Chest 2 View Result Date: 07/19/2023 CLINICAL DATA:  [id number] Dyspnea [id number] EXAM: CHEST - 2 VIEW COMPARISON:  July 18, 2023 FINDINGS: Lower lung volumes. Bilateral perihilar interstitial opacities throughout both lungs. Hazy airspace opacities within the lung bases with blunting of the costophrenic sulci. No pneumothorax. Mild cardiomegaly. Tortuous aorta with aortic atherosclerosis. No acute fracture or destructive lesions. Multilevel thoracic osteophytosis. IMPRESSION: Lower lung volumes. Redemonstrated cardiomegaly with findings of pulmonary edema. Increasing hazy airspace opacities within the lung bases, possibly due to pleural effusions and atelectasis or worsening edema. Electronically Signed   By: Rogelia Myers M.D.   On: 07/19/2023 08:22   DG Chest Port 1 View Result Date: 07/18/2023 CLINICAL DATA:  Dyspnea EXAM: PORTABLE CHEST 1 VIEW COMPARISON:  07/17/2023 FINDINGS: Cardiomegaly with vascular congestion and mild diffuse interstitial and ground-glass opacity, improved compared with 07/17/2023. Small bilateral effusions. No pneumothorax. Stent in the left axillary region IMPRESSION: Cardiomegaly with vascular congestion and edema, improved compared with 07/17/2023. Small bilateral effusions. Electronically Signed   By: Luke Bun M.D.   On: 07/18/2023 20:14   ECHOCARDIOGRAM COMPLETE Result Date: 07/18/2023    ECHOCARDIOGRAM REPORT   Patient Name:   Jeffery Joseph Date of Exam: 07/18/2023 Medical Rec #:  [id number]     Height:       77.0 in Accession #:    [id number]  Weight:       261.0 lb Date of Birth:   [birth year]     BSA:          2.505 m Patient Age:    82 years      BP:           157/58 mmHg Patient Gender: M             HR:           63 bpm. Exam Location:  [facility] Procedure: 2D Echo, Cardiac Doppler and Color Doppler (Both Spectral and Color            Flow Doppler were utilized during procedure). Indications:     Dyspnea R06.00  History:         Patient has no prior history of Echocardiogram examinations.  Sonographer:     Thedora Louder RDCS, FASE Referring Phys:  [id number] CLARETTA HERO AMPONSAH Diagnosing Phys: Cara JONETTA Lovelace MD  Sonographer Comments: This study was performed with the patient positioned supine. IMPRESSIONS  1. Left ventricular ejection fraction, by estimation, is 55 to 60%. The left ventricle has normal function. The left ventricle has no regional wall motion abnormalities. The left ventricular internal cavity size was mildly dilated. There is moderate concentric left ventricular hypertrophy. Left ventricular diastolic parameters are consistent with Grade III diastolic dysfunction (restrictive).  2. Right ventricular systolic function is normal. The right ventricular size is normal.  3. Left atrial size was moderately dilated.  4. Right atrial size was mild to moderately dilated.  5. The mitral valve is grossly normal. Trivial mitral valve regurgitation.  6. Tricuspid valve regurgitation is mild to moderate.  7. The aortic valve is grossly normal. Aortic valve regurgitation is mild. Mild aortic valve stenosis. FINDINGS  Left Ventricle: Left ventricular ejection fraction, by estimation, is 55 to 60%. The left ventricle has normal function. The left ventricle has no regional wall motion abnormalities. Strain was performed and the global longitudinal strain is indeterminate. Global longitudinal strain performed but not reported based on interpreter judgement due to suboptimal tracking. The left ventricular internal cavity size was mildly dilated. There is moderate concentric left ventricular  hypertrophy. Left ventricular diastolic parameters are consistent with Grade III diastolic dysfunction (restrictive). Right Ventricle: The right ventricular size is normal. No increase in right ventricular wall thickness. Right ventricular systolic function is normal. Left Atrium: Left atrial size was moderately dilated. Right Atrium: Right atrial size was mild to moderately dilated. Pericardium: There is no evidence of pericardial effusion. Mitral Valve: The mitral valve is grossly normal. Trivial mitral valve regurgitation. Tricuspid Valve: The tricuspid valve is normal in structure. Tricuspid valve regurgitation is mild to moderate. Aortic Valve: The aortic valve is grossly normal. Aortic valve regurgitation is mild. Aortic regurgitation PHT measures 505 msec. Mild aortic stenosis is present. Aortic valve mean gradient measures 20.8 mmHg. Aortic valve peak gradient measures 41.7 mmHg. Aortic valve area, by VTI measures 1.61 cm. Pulmonic Valve: The pulmonic valve was normal in structure. Pulmonic valve regurgitation is not visualized. Aorta: The ascending aorta was not well visualized. IAS/Shunts: No atrial level shunt detected by color flow Doppler. Additional Comments: 3D was performed not requiring image post processing on an independent workstation and was indeterminate.  LEFT VENTRICLE PLAX 2D LVIDd:         5.50 cm   Diastology LVIDs:         3.90 cm   LV e' medial:    8.16 cm/s LV PW:  1.50 cm   LV E/e' medial:  19.9 LV IVS:        1.60 cm   LV e' lateral:   12.50 cm/s LVOT diam:     2.10 cm   LV E/e' lateral: 13.0 LV SV:         116 LV SV Index:   46 LVOT Area:     3.46 cm  RIGHT VENTRICLE RV Basal diam:  3.60 cm RV S prime:     14.30 cm/s TAPSE (M-mode): 2.6 cm LEFT ATRIUM             Index        RIGHT ATRIUM           Index LA diam:        5.00 cm 2.00 cm/m   RA Area:     25.20 cm LA Vol (A2C):   93.0 ml 37.13 ml/m  RA Volume:   79.90 ml  31.90 ml/m LA Vol (A4C):   89.0 ml 35.53 ml/m LA  Biplane Vol: 94.0 ml 37.53 ml/m  AORTIC VALVE                     PULMONIC VALVE AV Area (Vmax):    1.58 cm      PV Vmax:          1.33 m/s AV Area (Vmean):   1.57 cm      PV Peak grad:     7.1 mmHg AV Area (VTI):     1.61 cm      PR End Diast Vel: 11.42 msec AV Vmax:           323.00 cm/s   RVOT Peak grad:   7 mmHg AV Vmean:          211.000 cm/s AV VTI:            0.725 m AV Peak Grad:      41.7 mmHg AV Mean Grad:      20.8 mmHg LVOT Vmax:         147.00 cm/s LVOT Vmean:        95.400 cm/s LVOT VTI:          0.336 m LVOT/AV VTI ratio: 0.46 AI PHT:            505 msec  AORTA Ao Root diam: 3.30 cm Ao Asc diam:  2.60 cm MITRAL VALVE                TRICUSPID VALVE MV Area (PHT): 3.42 cm     TR Peak grad:   59.0 mmHg MV Decel Time: 222 msec     TR Vmax:        384.00 cm/s MR Peak grad: 168.5 mmHg MR Mean grad: 117.0 mmHg    SHUNTS MR Vmax:      649.00 cm/s   Systemic VTI:  0.34 m MR Vmean:     513.0 cm/s    Systemic Diam: 2.10 cm MV E velocity: 162.00 cm/s MV A velocity: 73.90 cm/s MV E/A ratio:  2.19 Dwayne D Callwood MD Electronically signed by Cara JONETTA Lovelace MD Signature Date/Time: 07/18/2023/8:39:21 AM    Final    DG Chest Portable 1 View Result Date: 07/17/2023 CLINICAL DATA:  sob EXAM: PORTABLE CHEST 1 VIEW COMPARISON:  None Available. FINDINGS: Cardiac enlargement with some portion likely due to AP portable technique. Otherwise the heart and mediastinal contours are within normal limits. No focal consolidation. Pulmonary edema.  No pleural effusion. No pneumothorax. No acute osseous abnormality. IMPRESSION: 1. Pulmonary edema. 2. Cardiac enlargement with some portion likely due to AP portable technique. 3. Consider repeat chest x-ray PA and lateral view for further evaluation. Electronically Signed   By: Morgane  Naveau M.D.   On: 07/17/2023 21:07   VAS US  DUPLEX DIALYSIS ACCESS (AVF, AVG) Result Date: 07/14/2023 DIALYSIS ACCESS Patient Name:  Jeffery Joseph  Date of Exam:   07/12/2023 Medical Rec #:  [id number]      Accession #:    [id number] Date of Birth: Jun 21, 1940      Patient Gender: M Patient Age:   71 years Exam Location:  [facility] Procedure:      VAS US  DUPLEX DIALYSIS ACCESS (AVF, AVG) Referring Phys: CORDELLA SHAWL --------------------------------------------------------------------------------  Reason for Exam: Routine follow up. Access Site: Left Upper Extremity. Access Type: Brachial-cephalic AVF. History: Created 11/2021          Stent placed at cephalic / subclavian confluence 06/08/2023. Performing Technologist: Donnice Charnley RVT  Examination Guidelines: A complete evaluation includes B-mode imaging, spectral Doppler, color Doppler, and power Doppler as needed of all accessible portions of each vessel. Unilateral testing is considered an integral part of a complete examination. Limited examinations for reoccurring indications may be performed as noted.  Findings: +--------------------+----------+-----------------+--------+ AVF                 PSV (cm/s)Flow Vol (mL/min)Comments +--------------------+----------+-----------------+--------+ Native artery inflow   234          2520                +--------------------+----------+-----------------+--------+ AVF Anastomosis        609                              +--------------------+----------+-----------------+--------+  +---------------+----------+-------------+----------+--------+ OUTFLOW VEIN   PSV (cm/s)Diameter (cm)Depth (cm)Describe +---------------+----------+-------------+----------+--------+ Subclavian vein    85                                    +---------------+----------+-------------+----------+--------+ Confluence        282        0.73                        +---------------+----------+-------------+----------+--------+ Clavicle          214        0.78                        +---------------+----------+-------------+----------+--------+ Shoulder          148        0.74                         +---------------+----------+-------------+----------+--------+ Prox UA           172        0.60                        +---------------+----------+-------------+----------+--------+ Mid UA            172        0.67                        +---------------+----------+-------------+----------+--------+ Dist UA  167        0.84                        +---------------+----------+-------------+----------+--------+ AC Fossa          706        0.90                        +---------------+----------+-------------+----------+--------+   Summary: Patent arteriovenous fistula. *See table(s) above for measurements and observations.  Diagnosing physician: Cordella Shawl MD Electronically signed by Cordella Shawl MD on 07/14/2023 at 3:14:43 PM.   --------------------------------------------------------------------------------   Final     ECHO as above  TELEMETRY reviewed by me 07/20/2023: atrial flutter rate 120s  EKG reviewed by me: Normal sinus rhythm multiple PVCs and PACs rate of 90   Data reviewed by me 07/20/2023: last 24h vitals tele labs imaging I/O hospitalist progress notes.  Principal Problem:   Acute pulmonary edema (HCC) Active Problems:   Elevated troponin   Hypertensive emergency   SOB (shortness of breath)   Hypokalemia    ASSESSMENT AND PLAN:  Jeffery Joseph is a 83 y.o. 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  Patient is without chest pain or palpitations.  Cardiology was consulted for further evaluation.   # New onset Atrial flutter RVR Patient without palpitations, chest pain, SOB or lightheadedness. Patient with no known history of atrial fibrillation/flutter. EKG today and per tele with atrial flutter rate  120-130s. Patient started on IV amio gtt, however d/c on 07.07 in evening due to reported bradycardia. Per tele this AM in atrial flutter with rate sustaining 120s. Restarted IV amio. -Monitor and replenish electrolytes for a goal K >4, Mag >2  -Restart IV amio infusion.  -Continue metoprolol  as stated below for rate control.  -Transition from IV heparin  gtt to PO Eliquis  2.5 mg BID for stroke risk reduction. (Reduced dose due to age and renal function).  -CHA2DS2VASc score of 6  (in the setting of age, CHF, HTN, vascular disease, and diabetes history).  -Discussed atrial flutter with EP and recommended outpatient follow-up for ablation.   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  BNP elevated 2400 > 2000. CXR with cardiomegaly and pulmonary edema.  Echo his admission with preserved EF, grade 3 diastolic dysfunction, moderate concentric LVH.  -Monitor and replenish electrolytes for a goal K >4, Mag >2  -Nephrology consulted, appreciate recommendations. -Continue IV Lasix  40 mg every 8 hours per nephrology. -Continue metoprolol  to tartrate to 12.5 mg 3 times daily. Will uptitrate as BP allows. -Continue spironolactone  12.5 mg daily.  Uptitrate as BP allows. -Plan to optimize GDMT as BP and renal function allows.  -Patient continues to make urine, no plan for HD per nephrology.  # Demand ischemia  # Hypertension Patient without chest pain. Trops elevated and flat 150 > 250 > 440 > 540 > 590 > 600 > 540.  EKG without acute ischemic changes.  Echo this admission with preserved EF, no RWMA. -Completed at least 48 hrs of heparin  infusion. -d/c aspirin , now starting Eliquis  as stated above. (Patient takes ASA + Plavix  for  PAD) -Hold home amlodipine  10 mg daily and hydralazine  due to borderline BP, allow room for uptitration of rate control medications.  -Metoprolol  stated above.  This patient's plan of care  was discussed and created with Dr. Florencio and he is in agreement.  Signed: Dorene Comfort, PA-C  07/20/2023, 11:49 AM [facility] Cardiology

## 2023-07-20 NOTE — Plan of Care (Signed)

## 2023-07-20 NOTE — Progress Notes (Signed)
 Patient has pulmonary edema, and weakness  which requires upper torso to be positioned in ways not feasible with a normal bed. Shortness of breath and weakness frequently  upon wakening requires frequent/immediate changes in body position which cannot be achieved with a normal bed.

## 2023-07-20 NOTE — Progress Notes (Signed)
 [facility]  ROUNDING NOTE   Subjective:   Jeffery Joseph was admitted to [facility] on 07/17/2023 for Acute pulmonary edema (HCC) [J81.0] SOB (shortness of breath) [R06.02] Elevated troponin [R79.89]  Patient was having 3 to 4 days of progressive shortness of breath and peripheral edema.   Last seen by Nephrology on 5/7 by Dr. Marcelino.   Update Patient seen sitting up in bed Daughter at bedside Patient remains in good spirits Lower extremity edema slowly improving   Objective:  Vital signs in last 24 hours:  Temp:  [97.1 F (36.2 C)-100 F (37.8 C)] 98.2 F (36.8 C) (07/08 1000) Pulse Rate:  [52-130] 123 (07/08 1006) Resp:  [18-20] 20 (07/08 0908) BP: (110-140)/(64-102) 111/68 (07/08 1006) SpO2:  [93 %-98 %] 97 % (07/08 1000) Weight:  [125.3 kg] 125.3 kg (07/08 0437)  Weight change: -0.3 kg Filed Weights   07/17/23 2028 07/19/23 0556 07/20/23 0437  Weight: 118.4 kg 125.6 kg 125.3 kg    Intake/Output: I/O last 3 completed shifts: In: 2269.1 [P.O.:480; I.V.:1339.1; IV Piggyback:450] Out: 3250 [Urine:3250]   Intake/Output this shift:  Total I/O In: 120 [P.O.:120] Out: 600 [Urine:600]  Physical Exam: General: NAD, sitting up in bed  Head: Normocephalic, atraumatic. Moist oral mucosal membranes  Eyes: Anicteric  Lungs:  Bilateral crackles at bases  Heart: Regular rate and rhythm  Abdomen:  Soft, nontender, +abdominal wall edema  Extremities:  2+ peripheral edema  Neurologic: Alert, awake  Skin: No lesions  Access: Left AVF +thrill +bruit    Basic Metabolic Panel: Recent Labs  Lab 07/17/23 2042 07/18/23 0342 07/19/23 0748 07/20/23 0447  NA 143 142 144  143 143  K 3.3* 3.2* 3.6  3.6 3.6  CL 110 109 104  103 105  CO2 23 23 27  26 28   GLUCOSE 122* 110* 94  93 89  BUN 40* 39* 43*  42* 51*  CREATININE 3.20* 3.00* 3.26*  3.31* 3.69*  CALCIUM 8.8* 8.4* 9.1  9.1 8.8*  MG  --   --  2.0 2.2  PHOS  --  2.9  --  3.0    Liver Function  Tests: Recent Labs  Lab 07/17/23 2042 07/18/23 0342  AST 23  --   ALT 35  --   ALKPHOS 54  --   BILITOT 1.0  --   PROT 6.6  --   ALBUMIN 3.2* 2.9*   No results for input(s): LIPASE, AMYLASE in the last 168 hours. No results for input(s): AMMONIA in the last 168 hours.  CBC: Recent Labs  Lab 07/17/23 2042 07/18/23 0454 07/19/23 0748 07/20/23 0447  WBC 7.5 6.7 8.1 7.9  HGB 10.1* 9.5* 11.1* 10.0*  HCT 33.6* 30.8* 36.0* 31.2*  MCV 85.1 83.9 83.3 82.5  PLT 187 176 221 193    Cardiac Enzymes: No results for input(s): CKTOTAL, CKMB, CKMBINDEX, TROPONINI in the last 168 hours.  BNP: Invalid input(s): POCBNP  CBG: Recent Labs  Lab 07/19/23 0807 07/19/23 1148 07/19/23 1745 07/19/23 2149 07/20/23 0823  GLUCAP 93 99 107* 137* 84    Microbiology: Results for orders placed or performed during the hospital encounter of 07/17/23  Resp panel by RT-PCR (RSV, Flu A&B, Covid) Anterior Nasal Swab     Status: None   Collection Time: 07/17/23  8:42 PM   Specimen: Anterior Nasal Swab  Result Value Ref Range Status   SARS Coronavirus 2 by RT PCR NEGATIVE NEGATIVE Final    Comment: (NOTE) SARS-CoV-2 target nucleic acids are  NOT DETECTED.  The SARS-CoV-2 RNA is generally detectable in upper respiratory specimens during the acute phase of infection. The lowest concentration of SARS-CoV-2 viral copies this assay can detect is 138 copies/mL. A negative result does not preclude SARS-Cov-2 infection and should not be used as the sole basis for treatment or other patient management decisions. A negative result may occur with  improper specimen collection/handling, submission of specimen other than nasopharyngeal swab, presence of viral mutation(s) within the areas targeted by this assay, and inadequate number of viral copies(<138 copies/mL). A negative result must be combined with clinical observations, patient history, and epidemiological information. The expected  result is Negative.  Fact Sheet for Patients:  BloggerCourse.com  Fact Sheet for Healthcare Providers:  SeriousBroker.it  This test is no t yet approved or cleared by the [place]  FDA and  has been authorized for detection and/or diagnosis of SARS-CoV-2 by FDA under an Emergency Use Authorization (EUA). This EUA will remain  in effect (meaning this test can be used) for the duration of the COVID-19 declaration under Section 564(b)(1) of the Act, 21 U.S.C.section 360bbb-3(b)(1), unless the authorization is terminated  or revoked sooner.       Influenza A by PCR NEGATIVE NEGATIVE Final   Influenza B by PCR NEGATIVE NEGATIVE Final    Comment: (NOTE) The Xpert Xpress SARS-CoV-2/FLU/RSV plus assay is intended as an aid in the diagnosis of influenza from Nasopharyngeal swab specimens and should not be used as a sole basis for treatment. Nasal washings and aspirates are unacceptable for Xpert Xpress SARS-CoV-2/FLU/RSV testing.  Fact Sheet for Patients: BloggerCourse.com  Fact Sheet for Healthcare Providers: SeriousBroker.it  This test is not yet approved or cleared by the [place]  FDA and has been authorized for detection and/or diagnosis of SARS-CoV-2 by FDA under an Emergency Use Authorization (EUA). This EUA will remain in effect (meaning this test can be used) for the duration of the COVID-19 declaration under Section 564(b)(1) of the Act, 21 U.S.C. section 360bbb-3(b)(1), unless the authorization is terminated or revoked.     Resp Syncytial Virus by PCR NEGATIVE NEGATIVE Final    Comment: (NOTE) Fact Sheet for Patients: BloggerCourse.com  Fact Sheet for Healthcare Providers: SeriousBroker.it  This test is not yet approved or cleared by the [place]  FDA and has been authorized for detection and/or diagnosis of  SARS-CoV-2 by FDA under an Emergency Use Authorization (EUA). This EUA will remain in effect (meaning this test can be used) for the duration of the COVID-19 declaration under Section 564(b)(1) of the Act, 21 U.S.C. section 360bbb-3(b)(1), unless the authorization is terminated or revoked.  Performed at [facility], [address] [place], [place] [address]   Blood culture (routine x 2)     Status: None (Preliminary result)   Collection Time: 07/17/23  8:42 PM   Specimen: BLOOD  Result Value Ref Range Status   Specimen Description BLOOD RIGHT FOREARM  Final   Special Requests   Final    BOTTLES DRAWN AEROBIC AND ANAEROBIC Blood Culture adequate volume   Culture   Final    NO GROWTH 3 DAYS Performed at [facility], [address] [place], [place] [address]    Report Status PENDING  Incomplete  Blood culture (routine x 2)     Status: None (Preliminary result)   Collection Time: 07/17/23 10:33 PM   Specimen: BLOOD  Result Value Ref Range Status   Specimen Description BLOOD RIGHT ASSIST CONTROL  Final   Special Requests  Final    BOTTLES DRAWN AEROBIC AND ANAEROBIC Blood Culture results may not be optimal due to an inadequate volume of blood received in culture bottles   Culture   Final    NO GROWTH 3 DAYS Performed at [facility], [address] [place], [place] [address]    Report Status PENDING  Incomplete    Coagulation Studies: Recent Labs    07/17/23 08/09/40  LABPROT 17.0*  INR 1.3*    Urinalysis: Recent Labs    07/17/23 2012  COLORURINE YELLOW*  LABSPEC 1.012  PHURINE 6.0  GLUCOSEU NEGATIVE  HGBUR SMALL*  BILIRUBINUR NEGATIVE  KETONESUR NEGATIVE  PROTEINUR 100*  NITRITE NEGATIVE  LEUKOCYTESUR NEGATIVE      Imaging: DG Chest 2 View Result Date: 07/19/2023 CLINICAL DATA:  [id number] Dyspnea [id number] EXAM: CHEST - 2 VIEW COMPARISON:  July 18, 2023 FINDINGS: Lower lung volumes. Bilateral perihilar interstitial opacities  throughout both lungs. Hazy airspace opacities within the lung bases with blunting of the costophrenic sulci. No pneumothorax. Mild cardiomegaly. Tortuous aorta with aortic atherosclerosis. No acute fracture or destructive lesions. Multilevel thoracic osteophytosis. IMPRESSION: Lower lung volumes. Redemonstrated cardiomegaly with findings of pulmonary edema. Increasing hazy airspace opacities within the lung bases, possibly due to pleural effusions and atelectasis or worsening edema. Electronically Signed   By: Rogelia Myers M.D.   On: 07/19/2023 08:22   DG Chest Port 1 View Result Date: 07/18/2023 CLINICAL DATA:  Dyspnea EXAM: PORTABLE CHEST 1 VIEW COMPARISON:  07/17/2023 FINDINGS: Cardiomegaly with vascular congestion and mild diffuse interstitial and ground-glass opacity, improved compared with 07/17/2023. Small bilateral effusions. No pneumothorax. Stent in the left axillary region IMPRESSION: Cardiomegaly with vascular congestion and edema, improved compared with 07/17/2023. Small bilateral effusions. Electronically Signed   By: Luke Bun M.D.   On: 07/18/2023 20:14     Medications:    amiodarone  60 mg/hr (07/20/23 1033)   Followed by   amiodarone      azithromycin  (ZITHROMAX ) 500 mg in sodium chloride  0.9 % 250 mL IVPB 500 mg (07/19/23 Aug 09, 2333)   cefTRIAXone  (ROCEPHIN )  IV 1 g (07/19/23 2014)    ALPRAZolam   0.5 mg Oral BID   amLODipine   10 mg Oral Daily   apixaban   2.5 mg Oral BID   cholecalciferol   5,000 Units Oral Daily   clopidogrel   75 mg Oral Daily   donepezil   10 mg Oral QHS   enalapril   20 mg Oral BID   finasteride   5 mg Oral Daily   furosemide   40 mg Intravenous Q8H   glipiZIDE   5 mg Oral Q breakfast   hydrALAZINE   100 mg Oral TID   insulin  aspart  0-6 Units Subcutaneous TID WC   metoprolol  tartrate  12.5 mg Oral TID   polyethylene glycol  17 g Oral BID   senna-docusate  2 tablet Oral BID   sodium bicarbonate   650 mg Oral BID   spironolactone   12.5 mg Oral Daily    tamsulosin   0.4 mg Oral Daily   acetaminophen  **OR** acetaminophen , ondansetron  **OR** ondansetron  (ZOFRAN ) IV  Assessment/ Plan:  [name] is a 83 y.o.  male with chronic kidney disease stage IV, hypertension, diabetes mellitus type II, BPH, peripheral arterial disease, and diastolic congestive heart failure who presents on 07/17/2023 for Acute pulmonary edema (HCC) [J81.0] SOB (shortness of breath) [R06.02] Elevated troponin [R79.89]  Chronic Kidney Disease stage IV with proteinuria: baseline creatinine 3.44, GFR of 17.  - UOP 2.45L  - Creatinine remained stable - continue  enalapril  and furosemide  - continue sodium bicarbonate   Acute exacerbation of chronic diastolic congestive heart failure -Continue IV furosemide  - Oral spironolactone  25mg  daily  Hypertension with chronic kidney disease - continue IV furosemide  with Spironolactone  - continue enalapril , hydralazine , metoprolol , tamsulosin , and amlodipine  - Blood pressure remained stable 111/68  Diabetes mellitus type II with chronic kidney disease: hemoglobin A1c of 5.2% -glucose well controlled.   Hypokalemia: with history of hyperkalemia - Oral potassium chloride  supplementation  - Spironolactone    LOS: 3 Tenesia Escudero 7/8/20251:42 PM

## 2023-07-20 NOTE — Consult Note (Addendum)
 PHARMACY CONSULT NOTE - FOLLOW UP  Pharmacy Consult for Electrolyte Monitoring and Replacement   Recent Labs: Potassium (mmol/L)  Date Value  07/20/2023 3.6  05/06/2012 4.1   Magnesium (mg/dL)  Date Value  92/91/7974 2.2   Calcium (mg/dL)  Date Value  92/91/7974 8.8 (L)   Calcium, Total (mg/dL)  Date Value  95/74/7985 9.5   Albumin (g/dL)  Date Value  92/93/7974 2.9 (L)   Phosphorus (mg/dL)  Date Value  92/91/7974 3.0   Sodium (mmol/L)  Date Value  07/20/2023 143  05/06/2012 139     Assessment: 83 y.o. 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 Pharmacy is asked to follow and replace electrolytes. Scr trending up.   On Amio 200 mg BID, on enalapril  20 mg BID, on lasix  40 mg q8H, on spironolactone  12.5 mg daily,   Goal of Therapy:  K>4, Mag>2  Plan:  Kcl 40 mEq x 1 F/u with AM labs.   Cathaleen GORMAN Blanch ,PharmD Clinical Pharmacist 07/20/2023 7:44 AM

## 2023-07-20 NOTE — Evaluation (Signed)
 Occupational Therapy Evaluation Patient Details Name: Jeffery Joseph MRN: [id number] DOB: 1940-09-30 Today's Date: 07/20/2023   History of Present Illness   83 y/o male presented to ED on 07/17/23 for SOB. Admitted for acute pulmonary edema. PMH: ESRD, HTN, T2DM, PAD, anxiety, depression, dementia     Clinical Impressions Pt was seen for OT evaluation this date. Prior to hospital admission, pt was Modified Independent for basic ADLs with exception of showering (daughter assists 1x/wk for showering in walk-in shower with shower seat and grab bars), otherwise, patient would stand for bathing at sink. Pt lives in 1 level house with 2STE, no hand rail. Pt presents to acute OT demonstrating impaired ADL performance and functional mobility 2/2 increased generalized weakness, decreased functional coordination, decreased activity tolerance, and decreased functional balance (See OT problem list for additional functional deficits). Pt currently requires Supervision for bed mobility, Min A with increased attempts for functional transfers, Min A for UB ADLs with posterior leaning while utilizing BUEs for task engagement.  Pt would benefit from skilled OT services to address noted impairments and functional limitations (see below for any additional details) in order to maximize safety and independence while minimizing falls risk and caregiver burden.   Vitals assessed while supine (116/64, HR: 124) and at EOB (111/68, HR: 123).  Patient's catheter loose and IV (heparin  drip) was disconnected prior to session and leaking on floor, Medical staff notified immediately. At end of session, patient was returned to bed supine with HOB elevated, call button within reach, bed alarm on, and daughter present and attending at bedside.      If plan is discharge home, recommend the following:   A little help with walking and/or transfers;A little help with bathing/dressing/bathroom;Help with stairs or ramp for entrance;Assist for  transportation     Functional Status Assessment   Patient has had a recent decline in their functional status and demonstrates the ability to make significant improvements in function in a reasonable and predictable amount of time.     Equipment Recommendations   BSC/3in1;Toilet rise with handles     Recommendations for Other Services         Precautions/Restrictions   Precautions Precautions: Fall Recall of Precautions/Restrictions: Impaired Precaution/Restrictions Comments: fall risk Restrictions Weight Bearing Restrictions Per Provider Order: No     Mobility Bed Mobility Overal bed mobility: Needs Assistance Bed Mobility: Supine to Sit, Sit to Supine     Supine to sit: Supervision Sit to supine: Supervision        Transfers Overall transfer level: Needs assistance Equipment used: 1 person hand held assist Transfers: Sit to/from Stand Sit to Stand: Min assist           General transfer comment: HHA with cuing for use BUEs and to slow down to ensure stability as patient is unsteady.      Balance Overall balance assessment: Needs assistance Sitting-balance support: Single extremity supported, Feet supported, Bilateral upper extremity supported Sitting balance-Leahy Scale: Fair Sitting balance - Comments: posterior leaning with single extremity supported Postural control: Posterior lean Standing balance support: During functional activity, Single extremity supported Standing balance-Leahy Scale: Poor                             ADL either performed or assessed with clinical judgement   ADL Overall ADL's : Needs assistance/impaired                 Upper Body Dressing :  Minimal assistance       Toilet Transfer: Minimal assistance   Toileting- Clothing Manipulation and Hygiene: Maximal assistance       Functional mobility during ADLs: Minimal assistance       Vision Baseline Vision/History: 0 No visual deficits        Perception         Praxis         Pertinent Vitals/Pain Pain Assessment Pain Assessment: Faces Faces Pain Scale: Hurts a little bit Pain Location: B feet Pain Descriptors / Indicators: Sore Pain Intervention(s): Monitored during session     Extremity/Trunk Assessment Upper Extremity Assessment Upper Extremity Assessment: Generalized weakness           Communication Communication Communication: No apparent difficulties   Cognition Arousal: Alert Behavior During Therapy: WFL for tasks assessed/performed                                 Following commands: Intact       Cueing  General Comments   Cueing Techniques: Verbal cues;Tactile cues      Exercises Other Exercises Other Exercises: Education on role of OT, education on safety awareness, fall risk reduction   Shoulder Instructions      Home Living Family/patient expects to be discharged to:: Private residence Living Arrangements: Spouse/significant other;Children Available Help at Discharge: Family;Available 24 hours/day Type of Home: House Home Access: Stairs to enter [employer] of Steps: 2 STE , no railing Entrance Stairs-Rails: None Home Layout: One level     Bathroom Shower/Tub: Producer, television/film/video: Handicapped height Bathroom Accessibility: No   Home Equipment: Cane - single point          Prior Functioning/Environment Prior Level of Function : Independent/Modified Independent             Mobility Comments: uses SPC for mobility ADLs Comments: Per patient and daughter, patient was Mod I with ADLs, showered in walk-in shower with daughter's assistance 1x per week - seated (grab bars however not stable), comfort height commode - no grab bars.  Completes sink bath/wash ups daily while standing.    OT Problem List: Decreased strength;Decreased coordination;Cardiopulmonary status limiting activity;Increased edema;Decreased activity  tolerance;Decreased safety awareness;Impaired balance (sitting and/or standing)   OT Treatment/Interventions: Self-care/ADL training;Therapeutic exercise;Therapeutic activities;Neuromuscular education;Energy conservation;DME and/or AE instruction;Balance training;Patient/family education      OT Goals(Current goals can be found in the care plan section)   Acute Rehab OT Goals Patient Stated Goal: Return home OT Goal Formulation: With patient Time For Goal Achievement: 08/03/23 Potential to Achieve Goals: Good   OT Frequency:  Min 2X/week    Co-evaluation              AM-PAC OT 6 Clicks Daily Activity     Outcome Measure Help from another person eating meals?: None Help from another person taking care of personal grooming?: None Help from another person toileting, which includes using toliet, bedpan, or urinal?: A Lot Help from another person bathing (including washing, rinsing, drying)?: A Lot Help from another person to put on and taking off regular upper body clothing?: A Little Help from another person to put on and taking off regular lower body clothing?: A Lot 6 Click Score: 17   End of Session Equipment Utilized During Treatment: Gait belt  Activity Tolerance: Patient tolerated treatment well;No increased pain Patient left: in bed;with call bell/phone within reach;with bed alarm set;with family/visitor  present  OT Visit Diagnosis: Unsteadiness on feet (R26.81);Other abnormalities of gait and mobility (R26.89);Muscle weakness (generalized) (M62.81);History of falling (Z91.81)                Time: 9141-9059 OT Time Calculation (min): 42 min Charges:  OT General Charges $OT Visit: 1 Visit OT Evaluation $OT Eval Moderate Complexity: 1 Mod  Murel Shenberger OTR/L   Harlene LITTIE Sharps 07/20/2023, 10:14 AM

## 2023-07-20 NOTE — Progress Notes (Addendum)
 Patient is not able to walk the distance required to go the bathroom, or he/she is unable to safely negotiate stairs required to access the bathroom.  A  BSC will alleviate this problem. Bari BSC needed due to body habitus.

## 2023-07-21 ENCOUNTER — Telehealth (HOSPITAL_COMMUNITY): Payer: Self-pay | Admitting: Pharmacy Technician

## 2023-07-21 ENCOUNTER — Other Ambulatory Visit (HOSPITAL_COMMUNITY): Payer: Self-pay

## 2023-07-21 DIAGNOSIS — J810 Acute pulmonary edema: Secondary | ICD-10-CM | POA: Diagnosis not present

## 2023-07-21 LAB — CBC
HCT: 29.7 % — ABNORMAL LOW (ref 39.0–52.0)
Hemoglobin: 9.2 g/dL — ABNORMAL LOW (ref 13.0–17.0)
MCH: 25.3 pg — ABNORMAL LOW (ref 26.0–34.0)
MCHC: 31 g/dL (ref 30.0–36.0)
MCV: 81.6 fL (ref 80.0–100.0)
Platelets: 197 K/uL (ref 150–400)
RBC: 3.64 MIL/uL — ABNORMAL LOW (ref 4.22–5.81)
RDW: 16.2 % — ABNORMAL HIGH (ref 11.5–15.5)
WBC: 6 K/uL (ref 4.0–10.5)
nRBC: 0 % (ref 0.0–0.2)

## 2023-07-21 LAB — BASIC METABOLIC PANEL WITH GFR
Anion gap: 12 (ref 5–15)
BUN: 59 mg/dL — ABNORMAL HIGH (ref 8–23)
CO2: 27 mmol/L (ref 22–32)
Calcium: 8.7 mg/dL — ABNORMAL LOW (ref 8.9–10.3)
Chloride: 102 mmol/L (ref 98–111)
Creatinine, Ser: 4.11 mg/dL — ABNORMAL HIGH (ref 0.61–1.24)
GFR, Estimated: 14 mL/min — ABNORMAL LOW (ref >60)
Glucose, Bld: 84 mg/dL (ref 70–99)
Potassium: 3.7 mmol/L (ref 3.5–5.1)
Sodium: 141 mmol/L (ref 135–145)

## 2023-07-21 LAB — GLUCOSE, CAPILLARY
Glucose-Capillary: 102 mg/dL — ABNORMAL HIGH (ref 70–99)
Glucose-Capillary: 158 mg/dL — ABNORMAL HIGH (ref 70–99)
Glucose-Capillary: 78 mg/dL (ref 70–99)
Glucose-Capillary: 85 mg/dL (ref 70–99)

## 2023-07-21 LAB — MAGNESIUM: Magnesium: 2.3 mg/dL (ref 1.7–2.4)

## 2023-07-21 MED ORDER — AMIODARONE HCL 200 MG PO TABS
400.0000 mg | ORAL_TABLET | Freq: Two times a day (BID) | ORAL | Status: DC
  Administered 2023-07-21 – 2023-07-24 (×7): 400 mg via ORAL
  Filled 2023-07-21 (×7): qty 2

## 2023-07-21 MED ORDER — AMIODARONE HCL 200 MG PO TABS: 200.0000 mg | ORAL_TABLET | Freq: Every day | ORAL | Status: DC

## 2023-07-21 MED ORDER — POTASSIUM CHLORIDE CRYS ER 20 MEQ PO TBCR
40.0000 meq | EXTENDED_RELEASE_TABLET | Freq: Once | ORAL | Status: AC
  Administered 2023-07-21: 40 meq via ORAL
  Filled 2023-07-21: qty 2

## 2023-07-21 NOTE — Progress Notes (Addendum)
 [facility] CARDIOLOGY PROGRESS NOTE       Patient ID: Jeffery Joseph MRN: [id number] DOB/AGE: 02-28-1940 83 y.o.  Admit date: 07/17/2023 Referring Physician Dr. Lou Primary Physician Epifanio Alm SQUIBB, MD Primary Cardiologist None Reason for Consultation SOB, elevated trops  HPI: Jeffery Joseph is a 83 y.o. 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  Patient is without chest pain or palpitations.  Cardiology was consulted for further evaluation.    Interval History: -Patient seen and examined this AM and laying comfortably in bedside chair with daughters and wife at beside. Patient states he continues to feels good continues to deny any chest pain, SOB or palpitations. -Patient states he walked around the unit with walker yesterday with PT and did well and did not feel SOB. Patient states he had some issues with balance.  -Continues to have 1+ pedal edema but improving. -Patients BP stable and HR improved this AM. Per tele this AM in SR, rate 50s -Yesterday UOP 2.3L, light yellow color urine.  (Nephro following) -Patient remains on room air with stable SpO2.    Review of systems complete and found to be negative unless listed above    Past Medical History:  Diagnosis Date   Anxiety    Arthritis    Depression    Diabetes mellitus without complication (HCC)    Hypertension    Pneumonia    Renal disorder     Past Surgical History:  Procedure Laterality Date   A/V FISTULAGRAM Left 06/08/2023   Procedure: A/V Fistulagram;  Surgeon: Jama Cordella MATSU, MD;  Location: [facility] INVASIVE CV LAB;  Service: Cardiovascular;  Laterality: Left;   APPLICATION OF WOUND VAC Right 11/14/2021   Procedure: APPLICATION OF WOUND VAC;  Surgeon: Jama Cordella MATSU, MD;   Location: [facility] ORS;  Service: Vascular;  Laterality: Right;   AV FISTULA PLACEMENT Left 11/14/2021   Procedure: ARTERIOVENOUS (AV) FISTULA CREATION ( BRACHIAL CEPHALIC);  Surgeon: Jama Cordella MATSU, MD;  Location: [facility] ORS;  Service: Vascular;  Laterality: Left;    Medications Prior to Admission  Medication Sig Dispense Refill Last Dose/Taking   acetaminophen  (TYLENOL ) 325 MG tablet Take 650 mg by mouth every 6 (six) hours as needed.   Unknown   ALPRAZolam  (XANAX ) 0.5 MG tablet Take 0.5 mg by mouth 3 (three) times daily as needed for sleep.   07/17/2023 Morning   amLODipine  (NORVASC ) 10 MG tablet Take 10 mg by mouth daily.   07/17/2023 Morning   aspirin  EC 81 MG tablet Take 81 mg by mouth daily. Swallow whole.   07/17/2023 Morning   Cholecalciferol  125 MCG (5000 UT) TABS Take 5,000 Units by mouth daily.   07/17/2023 Morning   clopidogrel  (PLAVIX ) 75 MG tablet Take 1 tablet (75 mg total) by mouth daily. 30 tablet 11 07/17/2023 Morning   Docusate Sodium  (DSS) 100 MG CAPS Take 100 mg by mouth at bedtime.   07/16/2023 Bedtime   donepezil  (ARICEPT ) 10 MG tablet Take 10 mg by mouth at bedtime.   07/16/2023 Bedtime   enalapril  (VASOTEC ) 20 MG tablet Take 20 mg by mouth 2 (two) times daily.   07/17/2023 Evening   finasteride  (PROSCAR ) 5 MG tablet Take 1 tablet (5 mg total) by mouth daily. 90 tablet 3 07/17/2023 Morning  glipiZIDE  (GLUCOTROL  XL) 5 MG 24 hr tablet Take by mouth.   07/17/2023 Morning   hydrALAZINE  (APRESOLINE ) 50 MG tablet Take 100 mg by mouth 3 (three) times daily.   07/17/2023 Evening   LOKELMA 10 g PACK packet Take 1 packet by mouth daily.   07/17/2023 Morning   metoprolol  tartrate (LOPRESSOR ) 25 MG tablet Take 25 mg by mouth 2 (two) times daily in the am and at bedtime..   07/17/2023 Morning   sodium bicarbonate  650 MG tablet Take 650 mg by mouth 2 (two) times daily.   07/17/2023 Evening   tamsulosin  (FLOMAX ) 0.4 MG CAPS capsule Take 1 capsule (0.4 mg total) by mouth daily. 90 capsule 3 07/17/2023 Morning    Patiromer Sorbitex Calcium 16.8 g PACK Take by mouth. (Patient not taking: Reported on 07/17/2023)   Not Taking   Social History   Socioeconomic History   Marital status: Married    Spouse name: ,Loraine   Number of children: Not on file   Years of education: Not on file   Highest education level: Not on file  Occupational History   Not on file  Tobacco Use   Smoking status: Never   Smokeless tobacco: Never  Substance and Sexual Activity   Alcohol use: No   Drug use: Not Currently   Sexual activity: Not Currently  Other Topics Concern   Not on file  Social History Narrative   Not on file   Social Drivers of Health   Financial Resource Strain: Low Risk  (11/25/2022)   Received from [facility]   Overall Financial Resource Strain (CARDIA)    Difficulty of Paying Living Expenses: Not hard at all  Food Insecurity: No Food Insecurity (07/18/2023)   Hunger Vital Sign    Worried About Running Out of Food in the Last Year: Never true    Ran Out of Food in the Last Year: Never true  Transportation Needs: No Transportation Needs (07/18/2023)   PRAPARE - Administrator, Civil Service (Medical): No    Lack of Transportation (Non-Medical): No  Physical Activity: Not on file  Stress: Not on file  Social Connections: Socially Integrated (07/18/2023)   Social Connection and Isolation Panel    Frequency of Communication with Friends and Family: More than three times a week    Frequency of Social Gatherings with Friends and Family: More than three times a week    Attends Religious Services: More than 4 times per year    Active Member of [employer] or Organizations: Yes    Attends Banker Meetings: More than 4 times per year    Marital Status: Married  Catering manager Violence: Not At Risk (07/18/2023)   Humiliation, Afraid, Rape, and Kick questionnaire    Fear of Current or Ex-Partner: No    Emotionally Abused: No    Physically Abused: No    Sexually  Abused: No    History reviewed. No pertinent family history.   Vitals:   07/21/23 0030 07/21/23 0444 07/21/23 0448 07/21/23 0751  BP: 100/72 (!) 117/51  (!) 123/57  Pulse: (!) 54 (!) 56  (!) 53  Resp: 20 20  18   Temp: 98.2 F (36.8 C) 98.1 F (36.7 C)  98.9 F (37.2 C)  TempSrc:      SpO2: 100% 99%  100%  Weight:   119.6 kg   Height:        PHYSICAL EXAM General: Well-appearing elderly male, well nourished, in no acute distress.  HEENT: Normocephalic and atraumatic. Neck: No JVD.   Lungs: Normal respiratory effort on room air. Clear bilaterally to auscultation. No wheezes, crackles, rhonchi.  Heart: Irregular, elevated rates. Normal S1 and S2 without gallops or murmurs.  Abdomen: Non-distended appearing.  Msk: Normal strength and tone for age. Extremities: Warm and well perfused. No clubbing, cyanosis. 1+ pedal edema.  Neuro: Alert and oriented X 3. Psych: Answers questions appropriately.  Labs: Basic Metabolic Panel: Recent Labs    07/20/23 0447 07/21/23 0452  NA 143 141  K 3.6 3.7  CL 105 102  CO2 28 27  GLUCOSE 89 84  BUN 51* 59*  CREATININE 3.69* 4.11*  CALCIUM 8.8* 8.7*  MG 2.2 2.3  PHOS 3.0  --    Liver Function Tests: No results for input(s): AST, ALT, ALKPHOS, BILITOT, PROT, ALBUMIN in the last 72 hours.  No results for input(s): LIPASE, AMYLASE in the last 72 hours. CBC: Recent Labs    07/20/23 0447 07/21/23 0452  WBC 7.9 6.0  HGB 10.0* 9.2*  HCT 31.2* 29.7*  MCV 82.5 81.6  PLT 193 197   Cardiac Enzymes: Recent Labs    07/18/23 1630 07/18/23 1735 07/18/23 2050  TROPONINIHS 555* 605* 547*   BNP: Recent Labs    07/19/23 0748  BNP 2,062.1*   D-Dimer: No results for input(s): DDIMER in the last 72 hours. Hemoglobin A1C: No results for input(s): HGBA1C in the last 72 hours.  Fasting Lipid Panel: No results for input(s): CHOL, HDL, LDLCALC, TRIG, CHOLHDL, LDLDIRECT in the last 72 hours. Thyroid  Function Tests: Recent Labs    07/20/23 0446  TSH 1.633   Anemia Panel: No results for input(s): VITAMINB12, FOLATE, FERRITIN, TIBC, IRON, RETICCTPCT in the last 72 hours.   Radiology: DG Chest 2 View Result Date: 07/19/2023 CLINICAL DATA:  [id number] Dyspnea [id number] EXAM: CHEST - 2 VIEW COMPARISON:  July 18, 2023 FINDINGS: Lower lung volumes. Bilateral perihilar interstitial opacities throughout both lungs. Hazy airspace opacities within the lung bases with blunting of the costophrenic sulci. No pneumothorax. Mild cardiomegaly. Tortuous aorta with aortic atherosclerosis. No acute fracture or destructive lesions. Multilevel thoracic osteophytosis. IMPRESSION: Lower lung volumes. Redemonstrated cardiomegaly with findings of pulmonary edema. Increasing hazy airspace opacities within the lung bases, possibly due to pleural effusions and atelectasis or worsening edema. Electronically Signed   By: Rogelia Myers M.D.   On: 07/19/2023 08:22   DG Chest Port 1 View Result Date: 07/18/2023 CLINICAL DATA:  Dyspnea EXAM: PORTABLE CHEST 1 VIEW COMPARISON:  07/17/2023 FINDINGS: Cardiomegaly with vascular congestion and mild diffuse interstitial and ground-glass opacity, improved compared with 07/17/2023. Small bilateral effusions. No pneumothorax. Stent in the left axillary region IMPRESSION: Cardiomegaly with vascular congestion and edema, improved compared with 07/17/2023. Small bilateral effusions. Electronically Signed   By: Luke Bun M.D.   On: 07/18/2023 20:14   ECHOCARDIOGRAM COMPLETE Result Date: 07/18/2023    ECHOCARDIOGRAM REPORT   Patient Name:   Jeffery Joseph Date of Exam: 07/18/2023 Medical Rec #:  [id number]     Height:       77.0 in Accession #:    [id number]    Weight:       261.0 lb Date of Birth:  11/28/1940     BSA:          2.505 m Patient Age:    82 years      BP:           157/58 mmHg Patient Gender: M  HR:           63 bpm. Exam Location:  [facility] Procedure: 2D Echo, Cardiac  Doppler and Color Doppler (Both Spectral and Color            Flow Doppler were utilized during procedure). Indications:     Dyspnea R06.00  History:         Patient has no prior history of Echocardiogram examinations.  Sonographer:     Thedora Louder RDCS, FASE Referring Phys:  [id number] CLARETTA HERO AMPONSAH Diagnosing Phys: Cara JONETTA Lovelace MD  Sonographer Comments: This study was performed with the patient positioned supine. IMPRESSIONS  1. Left ventricular ejection fraction, by estimation, is 55 to 60%. The left ventricle has normal function. The left ventricle has no regional wall motion abnormalities. The left ventricular internal cavity size was mildly dilated. There is moderate concentric left ventricular hypertrophy. Left ventricular diastolic parameters are consistent with Grade III diastolic dysfunction (restrictive).  2. Right ventricular systolic function is normal. The right ventricular size is normal.  3. Left atrial size was moderately dilated.  4. Right atrial size was mild to moderately dilated.  5. The mitral valve is grossly normal. Trivial mitral valve regurgitation.  6. Tricuspid valve regurgitation is mild to moderate.  7. The aortic valve is grossly normal. Aortic valve regurgitation is mild. Mild aortic valve stenosis. FINDINGS  Left Ventricle: Left ventricular ejection fraction, by estimation, is 55 to 60%. The left ventricle has normal function. The left ventricle has no regional wall motion abnormalities. Strain was performed and the global longitudinal strain is indeterminate. Global longitudinal strain performed but not reported based on interpreter judgement due to suboptimal tracking. The left ventricular internal cavity size was mildly dilated. There is moderate concentric left ventricular hypertrophy. Left ventricular diastolic parameters are consistent with Grade III diastolic dysfunction (restrictive). Right Ventricle: The right ventricular size is normal. No increase in right  ventricular wall thickness. Right ventricular systolic function is normal. Left Atrium: Left atrial size was moderately dilated. Right Atrium: Right atrial size was mild to moderately dilated. Pericardium: There is no evidence of pericardial effusion. Mitral Valve: The mitral valve is grossly normal. Trivial mitral valve regurgitation. Tricuspid Valve: The tricuspid valve is normal in structure. Tricuspid valve regurgitation is mild to moderate. Aortic Valve: The aortic valve is grossly normal. Aortic valve regurgitation is mild. Aortic regurgitation PHT measures 505 msec. Mild aortic stenosis is present. Aortic valve mean gradient measures 20.8 mmHg. Aortic valve peak gradient measures 41.7 mmHg. Aortic valve area, by VTI measures 1.61 cm. Pulmonic Valve: The pulmonic valve was normal in structure. Pulmonic valve regurgitation is not visualized. Aorta: The ascending aorta was not well visualized. IAS/Shunts: No atrial level shunt detected by color flow Doppler. Additional Comments: 3D was performed not requiring image post processing on an independent workstation and was indeterminate.  LEFT VENTRICLE PLAX 2D LVIDd:         5.50 cm   Diastology LVIDs:         3.90 cm   LV e' medial:    8.16 cm/s LV PW:         1.50 cm   LV E/e' medial:  19.9 LV IVS:        1.60 cm   LV e' lateral:   12.50 cm/s LVOT diam:     2.10 cm   LV E/e' lateral: 13.0 LV SV:         116 LV SV Index:   46 LVOT Area:  3.46 cm  RIGHT VENTRICLE RV Basal diam:  3.60 cm RV S prime:     14.30 cm/s TAPSE (M-mode): 2.6 cm LEFT ATRIUM             Index        RIGHT ATRIUM           Index LA diam:        5.00 cm 2.00 cm/m   RA Area:     25.20 cm LA Vol (A2C):   93.0 ml 37.13 ml/m  RA Volume:   79.90 ml  31.90 ml/m LA Vol (A4C):   89.0 ml 35.53 ml/m LA Biplane Vol: 94.0 ml 37.53 ml/m  AORTIC VALVE                     PULMONIC VALVE AV Area (Vmax):    1.58 cm      PV Vmax:          1.33 m/s AV Area (Vmean):   1.57 cm      PV Peak grad:     7.1  mmHg AV Area (VTI):     1.61 cm      PR End Diast Vel: 11.42 msec AV Vmax:           323.00 cm/s   RVOT Peak grad:   7 mmHg AV Vmean:          211.000 cm/s AV VTI:            0.725 m AV Peak Grad:      41.7 mmHg AV Mean Grad:      20.8 mmHg LVOT Vmax:         147.00 cm/s LVOT Vmean:        95.400 cm/s LVOT VTI:          0.336 m LVOT/AV VTI ratio: 0.46 AI PHT:            505 msec  AORTA Ao Root diam: 3.30 cm Ao Asc diam:  2.60 cm MITRAL VALVE                TRICUSPID VALVE MV Area (PHT): 3.42 cm     TR Peak grad:   59.0 mmHg MV Decel Time: 222 msec     TR Vmax:        384.00 cm/s MR Peak grad: 168.5 mmHg MR Mean grad: 117.0 mmHg    SHUNTS MR Vmax:      649.00 cm/s   Systemic VTI:  0.34 m MR Vmean:     513.0 cm/s    Systemic Diam: 2.10 cm MV E velocity: 162.00 cm/s MV A velocity: 73.90 cm/s MV E/A ratio:  2.19 Dwayne D Callwood MD Electronically signed by Cara JONETTA Lovelace MD Signature Date/Time: 07/18/2023/8:39:21 AM    Final    DG Chest Portable 1 View Result Date: 07/17/2023 CLINICAL DATA:  sob EXAM: PORTABLE CHEST 1 VIEW COMPARISON:  None Available. FINDINGS: Cardiac enlargement with some portion likely due to AP portable technique. Otherwise the heart and mediastinal contours are within normal limits. No focal consolidation. Pulmonary edema. No pleural effusion. No pneumothorax. No acute osseous abnormality. IMPRESSION: 1. Pulmonary edema. 2. Cardiac enlargement with some portion likely due to AP portable technique. 3. Consider repeat chest x-ray PA and lateral view for further evaluation. Electronically Signed   By: Morgane  Naveau M.D.   On: 07/17/2023 21:07   VAS US  DUPLEX DIALYSIS ACCESS (AVF, AVG) Result Date: 07/14/2023 DIALYSIS ACCESS Patient  Name:  Jeffery Joseph  Date of Exam:   07/12/2023 Medical Rec #: [id number]      Accession #:    [id number] Date of Birth: 17-Oct-1940      Patient Gender: M Patient Age:   74 years Exam Location:  [facility] Procedure:      VAS US  DUPLEX DIALYSIS ACCESS  (AVF, AVG) Referring Phys: CORDELLA SHAWL --------------------------------------------------------------------------------  Reason for Exam: Routine follow up. Access Site: Left Upper Extremity. Access Type: Brachial-cephalic AVF. History: Created 11/2021          Stent placed at cephalic / subclavian confluence 06/08/2023. Performing Technologist: Donnice Charnley RVT  Examination Guidelines: A complete evaluation includes B-mode imaging, spectral Doppler, color Doppler, and power Doppler as needed of all accessible portions of each vessel. Unilateral testing is considered an integral part of a complete examination. Limited examinations for reoccurring indications may be performed as noted.  Findings: +--------------------+----------+-----------------+--------+ AVF                 PSV (cm/s)Flow Vol (mL/min)Comments +--------------------+----------+-----------------+--------+ Native artery inflow   234          2520                +--------------------+----------+-----------------+--------+ AVF Anastomosis        609                              +--------------------+----------+-----------------+--------+  +---------------+----------+-------------+----------+--------+ OUTFLOW VEIN   PSV (cm/s)Diameter (cm)Depth (cm)Describe +---------------+----------+-------------+----------+--------+ Subclavian vein    85                                    +---------------+----------+-------------+----------+--------+ Confluence        282        0.73                        +---------------+----------+-------------+----------+--------+ Clavicle          214        0.78                        +---------------+----------+-------------+----------+--------+ Shoulder          148        0.74                        +---------------+----------+-------------+----------+--------+ Prox UA           172        0.60                         +---------------+----------+-------------+----------+--------+ Mid UA            172        0.67                        +---------------+----------+-------------+----------+--------+ Dist UA           167        0.84                        +---------------+----------+-------------+----------+--------+ AC Fossa          706        0.90                        +---------------+----------+-------------+----------+--------+  Summary: Patent arteriovenous fistula. *See table(s) above for measurements and observations.  Diagnosing physician: Cordella Shawl MD Electronically signed by Cordella Shawl MD on 07/14/2023 at 3:14:43 PM.   --------------------------------------------------------------------------------   Final     ECHO as above  TELEMETRY reviewed by me 07/21/2023: Sinus rhythm with PACs, rate 50s  EKG reviewed by me: Normal sinus rhythm multiple PVCs and PACs rate of 90   Data reviewed by me 07/21/2023: last 24h vitals tele labs imaging I/O hospitalist progress notes.  Principal Problem:   Acute pulmonary edema (HCC) Active Problems:   Elevated troponin   Hypertensive emergency   SOB (shortness of breath)   Hypokalemia    ASSESSMENT AND PLAN:  Jeffery Joseph is a 83 y.o. 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  Patient is without chest pain or palpitations.  Cardiology was consulted for further evaluation.   # New onset Atrial flutter RVR Patient without palpitations, chest pain, SOB or lightheadedness. Patient with no known history of atrial fibrillation/flutter. EKG today and per tele with atrial flutter rate 120-130s. Patient started on IV amio gtt, however d/c on 07.07 in evening due to reported bradycardia. Per tele this AM in atrial flutter with  rate sustaining 120s. Restarted IV amio. -Monitor and replenish electrolytes for a goal K >4, Mag >2  - Transition IV  to PO amio 400 mg BID for 10 days, to 200 mg daily. .  -Continue metoprolol  as stated below for rate control.  -Continue PO Eliquis  2.5 mg BID for stroke risk reduction. (Reduced dose due to age and renal function).  -CHA2DS2VASc score of 6  (in the setting of age, CHF, HTN, vascular disease, and diabetes history).  -Discussed atrial flutter with EP and recommended outpatient follow-up for ablation. EP appointment scheduled with Dr. Daulbert on 07/23 at 11 AM.  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  BNP elevated 2400 > 2000. CXR with cardiomegaly and pulmonary edema.  Echo his admission with preserved EF, grade 3 diastolic dysfunction, moderate concentric LVH.  -Monitor and replenish electrolytes for a goal K >4, Mag >2  -Nephrology consulted, appreciate recommendations. -Continue IV Lasix  40 mg every 8 hours per nephrology. -Hold spironolactone  per nephrology. -Continue metoprolol  to tartrate to 12.5 mg 3 times daily. Will uptitrate as BP allows. -Not candidate for SGLT2i at this time due to low GFR. -Plan to optimize GDMT as BP and renal function allows.  -Patient continues to make urine, no plan for HD per nephrology.  # Demand ischemia  # Hypertension Patient without chest pain. Trops elevated and flat 150 > 250 > 440 > 540 > 590 > 600 > 540.  EKG without acute ischemic changes.  Echo this admission with preserved EF, no RWMA. -Completed at least 48 hrs of heparin  infusion. -Hold home amlodipine  10 mg daily and hydralazine  due to borderline BP, allow room for uptitration of rate control medications.  -Metoprolol  stated above.  This patient's plan of care was discussed and created with Dr. Florencio and he is in agreement.  Signed: Dorene Comfort, PA-C  07/21/2023, 9:57  AM [facility] Cardiology

## 2023-07-21 NOTE — Plan of Care (Signed)
  Problem: Coping: Goal: Ability to adjust to condition or change in health will improve Outcome: Progressing   Problem: Fluid Volume: Goal: Ability to maintain a balanced intake and output will improve Outcome: Progressing   Problem: Health Behavior/Discharge Planning: Goal: Ability to identify and utilize available resources and services will improve Outcome: Progressing   Problem: Metabolic: Goal: Ability to maintain appropriate glucose levels will improve Outcome: Progressing   Problem: Clinical Measurements: Goal: Ability to maintain clinical measurements within normal limits will improve Outcome: Progressing

## 2023-07-21 NOTE — Consult Note (Signed)
 PHARMACY CONSULT NOTE - FOLLOW UP  Pharmacy Consult for Electrolyte Monitoring and Replacement   Recent Labs: Potassium (mmol/L)  Date Value  07/21/2023 3.7  05/06/2012 4.1   Magnesium (mg/dL)  Date Value  92/90/7974 2.3   Calcium (mg/dL)  Date Value  92/90/7974 8.7 (L)   Calcium, Total (mg/dL)  Date Value  95/74/7985 9.5   Albumin (g/dL)  Date Value  92/93/7974 2.9 (L)   Phosphorus (mg/dL)  Date Value  92/91/7974 3.0   Sodium (mmol/L)  Date Value  07/21/2023 141  05/06/2012 139     Assessment: 83 y.o. 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 Pharmacy is asked to follow and replace electrolytes. Scr trending up.   On Amio 200 mg BID, on enalapril  20 mg BID, on lasix  40 mg q8H, on spironolactone  12.5 mg daily,   Goal of Therapy:  K>4, Mag>2  Plan:  Kcl 40 mEq x 1 F/u with AM labs.   Cathaleen GORMAN Blanch ,PharmD Clinical Pharmacist 07/21/2023 6:45 AM

## 2023-07-21 NOTE — Telephone Encounter (Signed)
 Patient Product/process development scientist completed.    The patient is insured through [employer]. Patient has Medicare and is not eligible for a copay card, but may be able to apply for patient assistance or Medicare RX Payment Plan (Patient Must reach out to their plan, if eligible for payment plan), if available.    Ran test claim for Eliquis 5 mg and the current 30 day co-pay is $47.00.   This test claim was processed through [facility]- copay amounts may vary at other pharmacies due to pharmacy/plan contracts, or as the patient moves through the different stages of their insurance plan.     Roland Earl, CPHT Pharmacy Technician III Certified Patient Advocate [facility] Pharmacy Patient Advocate Team Direct Number: [phone]  Fax: [phone]

## 2023-07-21 NOTE — Progress Notes (Signed)
 PROGRESS NOTE    Jeffery Joseph  FMW:[id number] DOB: 01-Aug-1940 DOA: 07/17/2023 PCP: Epifanio Alm SQUIBB, MD    Brief Narrative:     83 y.o. 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, T2DM, HTN, PAD, arthritis, anxiety and depression, anemia of chronic disease, chronic hyperkalemia and BPH who presented to the ED for evaluation of shortness of breath and admitted for acute pulmonary edema    7/6: Cardiology and nephrology consult 7/7: transfer to PCU, amio started 7/8: PT, OT eval DME ordered for home, stopping hydralazine  as blood pressure running low, Eliquis  started      Assessment & Plan:   Principal Problem:   Acute pulmonary edema (HCC) Active Problems:   Elevated troponin   Hypertensive emergency   SOB (shortness of breath)   Hypokalemia   # Acute pulmonary edema in the setting of hypertensive emergency # Acute on chronic HFpEF  - Patient with 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  Lasix  on hold on 7/9 due to rising creatinine3 Plan: Continue holding Lasix  for now Strict ins and outs, daily weights Continue with lactone 12.5 mg daily Recheck creatinine in a.m.   New onset A.flutter/RVR HR in 120-130s, asymptomatic Was on amiodarone  gtt.  This was weaned off.  Restarted on 7/8.  Heart rate improved. Plan: Wean off amiodarone  gtt. Start p.o. amiodarone   # Hypertensive emergency # Acute symptomatic hypertension Improved.  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  Nephrology  following.  Lasix  currently on hold   # T2DM Hemoglobin A1c 5.2.  Well-controlled.  Continue glipizide .   # Anemia of chronic disease Globin stable.  No indication for transfusion # PAD - Continue Plavix .  Stopping aspirin  as started on Eliquis    # BPH - Continue finasteride  and tamsulosin    # Anxiety and depression - Continue as needed Xanax    # Dementia - Continue Aricept    # Constipation Resolved.  Changed to as needed Senokot-S and MiraLAX      DVT prophylaxis: Apixaban  Code Status: DNR Family Communication: Family member at bedside 7/9 Disposition Plan: Status is: Inpatient Remains inpatient appropriate because: Multiple acute issues as above   Level of care: Progressive  Consultants:  Nephrology Cardiology  Procedures:  None  Antimicrobials: None   Subjective: Seen and examined.  Sitting in chair getting face shaved.  No pain complaints.  Objective: Vitals:   07/21/23 0444 07/21/23 0448 07/21/23 0751 07/21/23 1227  BP: (!) 117/51  (!) 123/57 (!) 114/57  Pulse: (!) 56  (!) 53 (!) 54  Resp: 20  18 14   Temp: 98.1 F (36.7 C)  98.9 F (37.2 C) 98 F (36.7 C)  TempSrc:      SpO2: 99%  100% 97%  Weight:  119.6 kg    Height:        Intake/Output Summary (Last 24 hours) at 07/21/2023 1525 Last data filed at 07/21/2023 1035 Gross per 24 hour  Intake 956.69 ml  Output 2550 ml  Net -1593.31 ml   [employer]  07/19/23 0556 07/20/23 0437 07/21/23 0448  Weight: 125.6 kg 125.3 kg 119.6 kg    Examination:  General exam: In NAD.  Appears fatigued Respiratory system: Basilar crackles.  Normal work of breathing.  Room air Cardiovascular system: 1 S2, RRR, no murmurs, pedal edema Gastrointestinal system: Abdomen is nondistended, soft and nontender. No organomegaly or masses felt. Normal bowel sounds heard. Central nervous system: Alert and oriented. No focal neurological deficits. Extremities: Symmetric 5 x 5 power. Skin: No rashes, lesions or  ulcers Psychiatry: Judgement and insight appear normal. Mood & affect appropriate.     Data Reviewed: I have personally reviewed following labs and imaging studies  CBC: Recent Labs  Lab 07/17/23 2042 07/18/23 0454 07/19/23 0748 07/20/23 0447 07/21/23 0452  WBC 7.5 6.7 8.1 7.9 6.0  HGB 10.1* 9.5* 11.1* 10.0* 9.2*  HCT 33.6* 30.8* 36.0* 31.2* 29.7*  MCV 85.1 83.9 83.3 82.5 81.6  PLT 187 176 221 193 197   Basic Metabolic Panel: Recent Labs  Lab 07/17/23 2042 07/18/23 0342 07/19/23 0748 07/20/23 0447 07/21/23 0452  NA 143 142 144  143 143 141  K 3.3* 3.2* 3.6  3.6 3.6 3.7  CL 110 109 104  103 105 102  CO2 23 23 27  26 28 27   GLUCOSE 122* 110* 94  93 89 84  BUN 40* 39* 43*  42* 51* 59*  CREATININE 3.20* 3.00* 3.26*  3.31* 3.69* 4.11*  CALCIUM 8.8* 8.4* 9.1  9.1 8.8* 8.7*  MG  --   --  2.0 2.2 2.3  PHOS  --  2.9  --  3.0  --    GFR: Estimated Creatinine Clearance: 19.5 mL/min (A) (by C-G formula based on SCr of 4.11 mg/dL (H)). Liver Function Tests: Recent Labs  Lab 07/17/23 2042 07/18/23 0342  AST 23  --   ALT 35  --   ALKPHOS 54  --   BILITOT 1.0  --   PROT 6.6  --   ALBUMIN 3.2* 2.9*   No results for input(s): LIPASE, AMYLASE in the last 168 hours. No results for input(s): AMMONIA in the last 168 hours. Coagulation Profile: Recent Labs  Lab 07/17/23 2042  INR 1.3*   Cardiac Enzymes: No results for input(s): CKTOTAL, CKMB, CKMBINDEX, TROPONINI in the last 168 hours. BNP (last 3 results) No results for input(s): PROBNP in the last 8760 hours. HbA1C: No results for input(s): HGBA1C in the last 72 hours. CBG: Recent Labs  Lab 07/20/23 0823 07/20/23 1622 07/20/23 2123 07/21/23 0751 07/21/23 1346  GLUCAP 84 92 95 102* 158*   Lipid Profile: No results for input(s): CHOL, HDL, LDLCALC, TRIG, CHOLHDL, LDLDIRECT in the last 72 hours. Thyroid Function Tests: Recent Labs    07/20/23 0446  TSH 1.633   Anemia  Panel: No results for input(s): VITAMINB12, FOLATE, FERRITIN, TIBC, IRON, RETICCTPCT in the last 72 hours. Sepsis Labs: Recent Labs  Lab 07/17/23 2042 07/17/23 2344 07/18/23 1902 07/18/23 2200  LATICACIDVEN 1.2 1.2 1.0 0.8    Recent Results (from the past 240 hours)  Resp panel by RT-PCR (RSV, Flu A&B, Covid) Anterior Nasal Swab     Status: None   Collection Time: 07/17/23  8:42 PM   Specimen: Anterior Nasal Swab  Result Value Ref Range Status   SARS Coronavirus 2 by RT PCR NEGATIVE NEGATIVE Final    Comment: (NOTE) SARS-CoV-2 target nucleic acids are NOT DETECTED.  The SARS-CoV-2 RNA is generally detectable in upper respiratory specimens during the acute phase of infection.  The lowest concentration of SARS-CoV-2 viral copies this assay can detect is 138 copies/mL. A negative result does not preclude SARS-Cov-2 infection and should not be used as the sole basis for treatment or other patient management decisions. A negative result may occur with  improper specimen collection/handling, submission of specimen other than nasopharyngeal swab, presence of viral mutation(s) within the areas targeted by this assay, and inadequate number of viral copies(<138 copies/mL). A negative result must be combined with clinical observations, patient history, and epidemiological information. The expected result is Negative.  Fact Sheet for Patients:  BloggerCourse.com  Fact Sheet for Healthcare Providers:  SeriousBroker.it  This test is no t yet approved or cleared by the [place]  FDA and  has been authorized for detection and/or diagnosis of SARS-CoV-2 by FDA under an Emergency Use Authorization (EUA). This EUA will remain  in effect (meaning this test can be used) for the duration of the COVID-19 declaration under Section 564(b)(1) of the Act, 21 U.S.C.section 360bbb-3(b)(1), unless the authorization is terminated  or  revoked sooner.       Influenza A by PCR NEGATIVE NEGATIVE Final   Influenza B by PCR NEGATIVE NEGATIVE Final    Comment: (NOTE) The Xpert Xpress SARS-CoV-2/FLU/RSV plus assay is intended as an aid in the diagnosis of influenza from Nasopharyngeal swab specimens and should not be used as a sole basis for treatment. Nasal washings and aspirates are unacceptable for Xpert Xpress SARS-CoV-2/FLU/RSV testing.  Fact Sheet for Patients: BloggerCourse.com  Fact Sheet for Healthcare Providers: SeriousBroker.it  This test is not yet approved or cleared by the [place]  FDA and has been authorized for detection and/or diagnosis of SARS-CoV-2 by FDA under an Emergency Use Authorization (EUA). This EUA will remain in effect (meaning this test can be used) for the duration of the COVID-19 declaration under Section 564(b)(1) of the Act, 21 U.S.C. section 360bbb-3(b)(1), unless the authorization is terminated or revoked.     Resp Syncytial Virus by PCR NEGATIVE NEGATIVE Final    Comment: (NOTE) Fact Sheet for Patients: BloggerCourse.com  Fact Sheet for Healthcare Providers: SeriousBroker.it  This test is not yet approved or cleared by the [place]  FDA and has been authorized for detection and/or diagnosis of SARS-CoV-2 by FDA under an Emergency Use Authorization (EUA). This EUA will remain in effect (meaning this test can be used) for the duration of the COVID-19 declaration under Section 564(b)(1) of the Act, 21 U.S.C. section 360bbb-3(b)(1), unless the authorization is terminated or revoked.  Performed at [facility], [address] [place], [place] [address]   Blood culture (routine x 2)     Status: None (Preliminary result)   Collection Time: 07/17/23  8:42 PM   Specimen: BLOOD  Result Value Ref Range Status   Specimen Description BLOOD RIGHT FOREARM  Final    Special Requests   Final    BOTTLES DRAWN AEROBIC AND ANAEROBIC Blood Culture adequate volume   Culture   Final    NO GROWTH 4 DAYS Performed at [facility], [address] [place], [place] [address]    Report Status PENDING  Incomplete  Blood culture (routine x 2)     Status: None (Preliminary result)   Collection Time: 07/17/23 10:33 PM   Specimen: BLOOD  Result Value Ref Range Status   Specimen Description BLOOD RIGHT ASSIST CONTROL  Final   Special Requests   Final    BOTTLES DRAWN AEROBIC AND ANAEROBIC Blood Culture results may not be optimal due to  an inadequate volume of blood received in culture bottles   Culture   Final    NO GROWTH 4 DAYS Performed at [facility], [address] [place], [place] [address]    Report Status PENDING  Incomplete         Radiology Studies: No results found.      Scheduled Meds:  ALPRAZolam   0.5 mg Oral BID   amiodarone   400 mg Oral BID   Followed by   NOREEN ON 07/31/2023] amiodarone   200 mg Oral Daily   apixaban   2.5 mg Oral BID   azithromycin   500 mg Oral Daily   cholecalciferol   5,000 Units Oral Daily   clopidogrel   75 mg Oral Daily   donepezil   10 mg Oral QHS   enalapril   20 mg Oral BID   finasteride   5 mg Oral Daily   glipiZIDE   5 mg Oral Q breakfast   insulin  aspart  0-6 Units Subcutaneous TID WC   metoprolol  tartrate  12.5 mg Oral TID   sodium bicarbonate   650 mg Oral BID   tamsulosin   0.4 mg Oral Daily   Continuous Infusions:  cefTRIAXone  (ROCEPHIN )  IV 2 g (07/20/23 2027)     LOS: 4 days      Jeffery KATHEE Robson, MD [facility]   If 7PM-7AM, please contact night-coverage  07/21/2023, 3:25 PM

## 2023-07-21 NOTE — Progress Notes (Signed)
 [facility]  ROUNDING NOTE   Subjective:   [name] was admitted to [facility] on 07/17/2023 for Acute pulmonary edema (HCC) [J81.0] SOB (shortness of breath) [R06.02] Elevated troponin [R79.89]  Patient was having 3 to 4 days of progressive shortness of breath and peripheral edema.   Last seen by Nephrology on 5/7 by Dr. Marcelino.   Update Patient seen sitting up in chair earlier today Alert and oriented Lower extremity edema improved but remains  Creatinine 4.11 UOP 2.3L  Objective:  Vital signs in last 24 hours:  Temp:  [97.7 F (36.5 C)-98.9 F (37.2 C)] 98.8 F (37.1 C) (07/09 1548) Pulse Rate:  [53-74] 57 (07/09 1548) Resp:  [14-20] 20 (07/09 1548) BP: (100-123)/(46-72) 119/52 (07/09 1548) SpO2:  [97 %-100 %] 99 % (07/09 1548) Weight:  [119.6 kg] 119.6 kg (07/09 0448)  Weight change: -5.7 kg Filed Weights   07/19/23 0556 07/20/23 0437 07/21/23 0448  Weight: 125.6 kg 125.3 kg 119.6 kg    Intake/Output: I/O last 3 completed shifts: In: 1790.7 [P.O.:480; I.V.:1110.7; IV Piggyback:200] Out: 3300 [Urine:3300]   Intake/Output this shift:  Total I/O In: 480 [P.O.:480] Out: 850 [Urine:850]  Physical Exam: General: NAD, sitting up in chair  Head: Normocephalic, atraumatic. Moist oral mucosal membranes  Eyes: Anicteric  Lungs:  Diminished  Heart: Regular rate and rhythm  Abdomen:  Soft, nontender, +abdominal wall edema  Extremities:  1+ peripheral edema  Neurologic: Alert, awake  Skin: No lesions  Access: Left AVF +thrill +bruit    Basic Metabolic Panel: Recent Labs  Lab 07/17/23 2042 07/18/23 0342 07/19/23 0748 07/20/23 0447 07/21/23 0452  NA 143 142 144  143 143 141  K 3.3* 3.2* 3.6  3.6 3.6 3.7  CL 110 109 104  103 105 102  CO2 23 23 27  26 28 27   GLUCOSE 122* 110* 94  93 89 84  BUN 40* 39* 43*  42* 51* 59*  CREATININE 3.20* 3.00* 3.26*  3.31* 3.69* 4.11*  CALCIUM 8.8* 8.4* 9.1  9.1 8.8* 8.7*  MG  --   --  2.0 2.2 2.3   PHOS  --  2.9  --  3.0  --     Liver Function Tests: Recent Labs  Lab 07/17/23 2042 07/18/23 0342  AST 23  --   ALT 35  --   ALKPHOS 54  --   BILITOT 1.0  --   PROT 6.6  --   ALBUMIN 3.2* 2.9*   No results for input(s): LIPASE, AMYLASE in the last 168 hours. No results for input(s): AMMONIA in the last 168 hours.  CBC: Recent Labs  Lab 07/17/23 2042 07/18/23 0454 07/19/23 0748 07/20/23 0447 07/21/23 0452  WBC 7.5 6.7 8.1 7.9 6.0  HGB 10.1* 9.5* 11.1* 10.0* 9.2*  HCT 33.6* 30.8* 36.0* 31.2* 29.7*  MCV 85.1 83.9 83.3 82.5 81.6  PLT 187 176 221 193 197    Cardiac Enzymes: No results for input(s): CKTOTAL, CKMB, CKMBINDEX, TROPONINI in the last 168 hours.  BNP: Invalid input(s): POCBNP  CBG: Recent Labs  Lab 07/20/23 1622 07/20/23 2123 07/21/23 0751 07/21/23 1346 07/21/23 1644  GLUCAP 92 95 102* 158* 78    Microbiology: Results for orders placed or performed during the hospital encounter of 07/17/23  Resp panel by RT-PCR (RSV, Flu A&B, Covid) Anterior Nasal Swab     Status: None   Collection Time: 07/17/23  8:42 PM   Specimen: Anterior Nasal Swab  Result Value Ref Range Status  SARS Coronavirus 2 by RT PCR NEGATIVE NEGATIVE Final    Comment: (NOTE) SARS-CoV-2 target nucleic acids are NOT DETECTED.  The SARS-CoV-2 RNA is generally detectable in upper respiratory specimens during the acute phase of infection. The lowest concentration of SARS-CoV-2 viral copies this assay can detect is 138 copies/mL. A negative result does not preclude SARS-Cov-2 infection and should not be used as the sole basis for treatment or other patient management decisions. A negative result may occur with  improper specimen collection/handling, submission of specimen other than nasopharyngeal swab, presence of viral mutation(s) within the areas targeted by this assay, and inadequate number of viral copies(<138 copies/mL). A negative result must be combined  with clinical observations, patient history, and epidemiological information. The expected result is Negative.  Fact Sheet for Patients:  BloggerCourse.com  Fact Sheet for Healthcare Providers:  SeriousBroker.it  This test is no t yet approved or cleared by the [place]  FDA and  has been authorized for detection and/or diagnosis of SARS-CoV-2 by FDA under an Emergency Use Authorization (EUA). This EUA will remain  in effect (meaning this test can be used) for the duration of the COVID-19 declaration under Section 564(b)(1) of the Act, 21 U.S.C.section 360bbb-3(b)(1), unless the authorization is terminated  or revoked sooner.       Influenza A by PCR NEGATIVE NEGATIVE Final   Influenza B by PCR NEGATIVE NEGATIVE Final    Comment: (NOTE) The Xpert Xpress SARS-CoV-2/FLU/RSV plus assay is intended as an aid in the diagnosis of influenza from Nasopharyngeal swab specimens and should not be used as a sole basis for treatment. Nasal washings and aspirates are unacceptable for Xpert Xpress SARS-CoV-2/FLU/RSV testing.  Fact Sheet for Patients: BloggerCourse.com  Fact Sheet for Healthcare Providers: SeriousBroker.it  This test is not yet approved or cleared by the [place]  FDA and has been authorized for detection and/or diagnosis of SARS-CoV-2 by FDA under an Emergency Use Authorization (EUA). This EUA will remain in effect (meaning this test can be used) for the duration of the COVID-19 declaration under Section 564(b)(1) of the Act, 21 U.S.C. section 360bbb-3(b)(1), unless the authorization is terminated or revoked.     Resp Syncytial Virus by PCR NEGATIVE NEGATIVE Final    Comment: (NOTE) Fact Sheet for Patients: BloggerCourse.com  Fact Sheet for Healthcare Providers: SeriousBroker.it  This test is not yet approved  or cleared by the [place]  FDA and has been authorized for detection and/or diagnosis of SARS-CoV-2 by FDA under an Emergency Use Authorization (EUA). This EUA will remain in effect (meaning this test can be used) for the duration of the COVID-19 declaration under Section 564(b)(1) of the Act, 21 U.S.C. section 360bbb-3(b)(1), unless the authorization is terminated or revoked.  Performed at [facility], [address] [place], [place] [address]   Blood culture (routine x 2)     Status: None (Preliminary result)   Collection Time: 07/17/23  8:42 PM   Specimen: BLOOD  Result Value Ref Range Status   Specimen Description BLOOD RIGHT FOREARM  Final   Special Requests   Final    BOTTLES DRAWN AEROBIC AND ANAEROBIC Blood Culture adequate volume   Culture   Final    NO GROWTH 4 DAYS Performed at [facility], [address], [place], [place] [address]    Report Status PENDING  Incomplete  Blood culture (routine x 2)     Status: None (Preliminary result)   Collection Time: 07/17/23 10:33 PM   Specimen: BLOOD  Result  Value Ref Range Status   Specimen Description BLOOD RIGHT ASSIST CONTROL  Final   Special Requests   Final    BOTTLES DRAWN AEROBIC AND ANAEROBIC Blood Culture results may not be optimal due to an inadequate volume of blood received in culture bottles   Culture   Final    NO GROWTH 4 DAYS Performed at [facility], [address] [place], [place] [address]    Report Status PENDING  Incomplete    Coagulation Studies: No results for input(s): LABPROT, INR in the last 72 hours.   Urinalysis: No results for input(s): COLORURINE, LABSPEC, PHURINE, GLUCOSEU, HGBUR, BILIRUBINUR, KETONESUR, PROTEINUR, UROBILINOGEN, NITRITE, LEUKOCYTESUR in the last 72 hours.  Invalid input(s): APPERANCEUR     Imaging: No results found.    Medications:    cefTRIAXone  (ROCEPHIN )  IV 2 g (07/20/23 2027)    ALPRAZolam    0.5 mg Oral BID   amiodarone   400 mg Oral BID   Followed by   NOREEN ON 07/31/2023] amiodarone   200 mg Oral Daily   apixaban   2.5 mg Oral BID   azithromycin   500 mg Oral Daily   cholecalciferol   5,000 Units Oral Daily   clopidogrel   75 mg Oral Daily   donepezil   10 mg Oral QHS   enalapril   20 mg Oral BID   finasteride   5 mg Oral Daily   glipiZIDE   5 mg Oral Q breakfast   insulin  aspart  0-6 Units Subcutaneous TID WC   metoprolol  tartrate  12.5 mg Oral TID   sodium bicarbonate   650 mg Oral BID   tamsulosin   0.4 mg Oral Daily   acetaminophen  **OR** acetaminophen , ondansetron  **OR** ondansetron  (ZOFRAN ) IV, polyethylene glycol, senna-docusate  Assessment/ Plan:  Mr. SANTIAGO STENZEL is a 83 y.o.  male with chronic kidney disease stage IV, hypertension, diabetes mellitus type II, BPH, peripheral arterial disease, and diastolic congestive heart failure who presents on 07/17/2023 for Acute pulmonary edema (HCC) [J81.0] SOB (shortness of breath) [R06.02] Elevated troponin [R79.89]  Chronic Kidney Disease stage IV with proteinuria: baseline creatinine 3.44, GFR of 17.  - UOP 2.3L  - Creatinine rose today, 4.11 - Will hold furosemide  and spironolactone .  - continue enalapril   - continue sodium bicarbonate   Acute exacerbation of chronic diastolic congestive heart failure -Furosemide  and furosemide  held due to kidney injury   Hypertension with chronic kidney disease -Holding furosemide  with Spironolactone  - continue enalapril , hydralazine , metoprolol , tamsulosin , and amlodipine  - Blood pressure 119/52  Diabetes mellitus type II with chronic kidney disease: hemoglobin A1c of 5.2% -glucose well controlled.  - Primary team to manage SSI  Hypokalemia: with history of hyperkalemia - Oral potassium chloride  supplementation as needed    LOS: 4 Nyela Cortinas 7/9/20256:41 PM

## 2023-07-22 ENCOUNTER — Inpatient Hospital Stay

## 2023-07-22 DIAGNOSIS — J810 Acute pulmonary edema: Secondary | ICD-10-CM | POA: Diagnosis not present

## 2023-07-22 LAB — BASIC METABOLIC PANEL WITH GFR
Anion gap: 11 (ref 5–15)
BUN: 61 mg/dL — ABNORMAL HIGH (ref 8–23)
CO2: 27 mmol/L (ref 22–32)
Calcium: 8.5 mg/dL — ABNORMAL LOW (ref 8.9–10.3)
Chloride: 102 mmol/L (ref 98–111)
Creatinine, Ser: 3.95 mg/dL — ABNORMAL HIGH (ref 0.61–1.24)
GFR, Estimated: 14 mL/min — ABNORMAL LOW (ref >60)
Glucose, Bld: 77 mg/dL (ref 70–99)
Potassium: 4.1 mmol/L (ref 3.5–5.1)
Sodium: 140 mmol/L (ref 135–145)

## 2023-07-22 LAB — CBC
HCT: 29.3 % — ABNORMAL LOW (ref 39.0–52.0)
Hemoglobin: 9.1 g/dL — ABNORMAL LOW (ref 13.0–17.0)
MCH: 25.4 pg — ABNORMAL LOW (ref 26.0–34.0)
MCHC: 31.1 g/dL (ref 30.0–36.0)
MCV: 81.8 fL (ref 80.0–100.0)
Platelets: 207 K/uL (ref 150–400)
RBC: 3.58 MIL/uL — ABNORMAL LOW (ref 4.22–5.81)
RDW: 16.5 % — ABNORMAL HIGH (ref 11.5–15.5)
WBC: 5.1 K/uL (ref 4.0–10.5)
nRBC: 0 % (ref 0.0–0.2)

## 2023-07-22 LAB — GLUCOSE, CAPILLARY
Glucose-Capillary: 88 mg/dL (ref 70–99)
Glucose-Capillary: 90 mg/dL (ref 70–99)
Glucose-Capillary: 117 mg/dL — ABNORMAL HIGH (ref 70–99)
Glucose-Capillary: 170 mg/dL — ABNORMAL HIGH (ref 70–99)

## 2023-07-22 LAB — MAGNESIUM: Magnesium: 2.2 mg/dL (ref 1.7–2.4)

## 2023-07-22 MED ORDER — PREDNISONE 20 MG PO TABS
40.0000 mg | ORAL_TABLET | Freq: Once | ORAL | Status: AC
  Administered 2023-07-22: 40 mg via ORAL
  Filled 2023-07-22: qty 2

## 2023-07-22 MED ORDER — METOPROLOL SUCCINATE ER 25 MG PO TB24
12.5000 mg | ORAL_TABLET | Freq: Every day | ORAL | Status: DC
  Administered 2023-07-22: 12.5 mg via ORAL
  Filled 2023-07-22 (×2): qty 1

## 2023-07-22 MED ORDER — DICLOFENAC SODIUM 1 % EX GEL
4.0000 g | Freq: Four times a day (QID) | CUTANEOUS | Status: DC
  Administered 2023-07-22 – 2023-07-24 (×9): 4 g via TOPICAL
  Filled 2023-07-22 (×2): qty 100

## 2023-07-22 NOTE — Progress Notes (Signed)
 Occupational Therapy Treatment Patient Details Name: Jeffery Joseph MRN: [id number] DOB: 1940/06/15 Today's Date: 07/22/2023   History of present illness 83 y/o male presented to ED on 07/17/23 for SOB. Admitted for acute pulmonary edema. PMH: ESRD, HTN, T2DM, PAD, anxiety, depression, dementia   OT comments  Pt seen for OT tx. Pt received in recliner, denies complaints. Pt instructed in energy conservation strategies including AE/DME, home/routines modifications, falls prevention, and facilitated problem solving to optimize safety and indep while encouraging activity pacing and work simplification to ensure pt is able to engage in meaningful occupations. Pt verbalized understanding, able to share several examples of ADL routines and strategies to incorporate to support safe participation. Pt progressing, continues to benefit.       If plan is discharge home, recommend the following:  A little help with walking and/or transfers;A little help with bathing/dressing/bathroom;Help with stairs or ramp for entrance;Assist for transportation   Equipment Recommendations  BSC/3in1;Toilet rise with handles    Recommendations for Other Services      Precautions / Restrictions Precautions Precautions: Fall Recall of Precautions/Restrictions: Impaired Precaution/Restrictions Comments: fall risk Restrictions Weight Bearing Restrictions Per Provider Order: No       Mobility Bed Mobility               General bed mobility comments: up in recliner at start/end of session    Transfers                         Balance Overall balance assessment: Needs assistance Sitting-balance support: Single extremity supported, Feet supported, Bilateral upper extremity supported Sitting balance-Leahy Scale: Fair                                     ADL either performed or assessed with clinical judgement   ADL                                               Extremity/Trunk Assessment              Vision       Perception     Praxis     Communication     Cognition Arousal: Alert Behavior During Therapy: WFL for tasks assessed/performed Cognition: No apparent impairments                               Following commands: Intact        Cueing      Exercises Other Exercises Other Exercises: Pt instructed in energy conservation strategies including AE/DME, home/routines modifications, falls prevention, and facilitated problem solving to optimize safety and indep while encouraging activity pacing and work simplification to ensure pt is able to engage in meaningful occupations.    Shoulder Instructions       General Comments      Pertinent Vitals/ Pain       Pain Assessment Pain Assessment: No/denies pain  Home Living                                          Prior Functioning/Environment  Frequency  Min 2X/week        Progress Toward Goals  OT Goals(current goals can now be found in the care plan section)  Progress towards OT goals: Progressing toward goals  Acute Rehab OT Goals Patient Stated Goal: return home OT Goal Formulation: With patient Time For Goal Achievement: 08/03/23 Potential to Achieve Goals: Good  Plan      Co-evaluation                 AM-PAC OT 6 Clicks Daily Activity     Outcome Measure   Help from another person eating meals?: None Help from another person taking care of personal grooming?: None Help from another person toileting, which includes using toliet, bedpan, or urinal?: A Lot Help from another person bathing (including washing, rinsing, drying)?: A Lot Help from another person to put on and taking off regular upper body clothing?: A Little Help from another person to put on and taking off regular lower body clothing?: A Lot 6 Click Score: 17    End of Session    OT Visit Diagnosis: Unsteadiness on feet  (R26.81);Other abnormalities of gait and mobility (R26.89);Muscle weakness (generalized) (M62.81);History of falling (Z91.81)   Activity Tolerance Patient tolerated treatment well   Patient Left in chair;with call bell/phone within reach;with chair alarm set   Nurse Communication          Time: 8879-8870 OT Time Calculation (min): 9 min  Charges: OT General Charges $OT Visit: 1 Visit OT Treatments $Therapeutic Activity: 8-22 mins  Warren SAUNDERS., MPH, MS, OTR/L ascom [phone] 07/22/23, 3:30 PM

## 2023-07-22 NOTE — Progress Notes (Signed)
 Physical Therapy Treatment Patient Details Name: Jeffery Joseph MRN: [id number] DOB: 07-17-1940 Today's Date: 07/22/2023   History of Present Illness 83 y/o male presented to ED on 07/17/23 for SOB. Admitted for acute pulmonary edema. PMH: ESRD, HTN, T2DM, PAD, anxiety, depression, dementia    PT Comments  Patient alert, agreeable to PT, up in recliner. Able to sit <> stand with CGA and bariatric RW. Did need two attempts to successfully stand (from standard recliner height). He ambulated ~178ft with RW, CGA. Pt needed standing rest break in middle of ambulation. noted for increased fatigue and unsteadiness towards end of ambulation. pt encouraged to improve posture and RW positioning to increase safety.  Up in recliner with needs in reach. The patient would benefit from further skilled PT intervention to continue to progress towards goals.   If plan is discharge home, recommend the following: A little help with walking and/or transfers;A little help with bathing/dressing/bathroom;Assistance with cooking/housework;Direct supervision/assist for medications management;Direct supervision/assist for financial management;Assist for transportation;Help with stairs or ramp for entrance   Can travel by private vehicle        Equipment Recommendations  Rolling walker (2 wheels) (bariatric tall walker)    Recommendations for Other Services       Precautions / Restrictions Precautions Precautions: Fall Recall of Precautions/Restrictions: Impaired Precaution/Restrictions Comments: fall risk Restrictions Weight Bearing Restrictions Per Provider Order: No     Mobility  Bed Mobility               General bed mobility comments: up in recliner at start/end of session    Transfers Overall transfer level: Needs assistance Equipment used: Rolling walker (2 wheels) Transfers: Sit to/from Stand Sit to Stand: Contact guard assist           General transfer comment: needed two attempts from  low recliner to come up into standing    Ambulation/Gait Ambulation/Gait assistance: Contact guard assist Gait Distance (Feet): 150 Feet Assistive device: Rolling walker (2 wheels)   Gait velocity: decreased     General Gait Details: pt needed standing rest break in middle of ambulation. noted for increased fatigue and unsteadiness towards end of ambulation. pt encouraged to improve posture and RW positioning to increase safety.   Stairs             Wheelchair Mobility     Tilt Bed    Modified Rankin (Stroke Patients Only)       Balance Overall balance assessment: Needs assistance Sitting-balance support: Single extremity supported, Feet supported, Bilateral upper extremity supported Sitting balance-Leahy Scale: Fair     Standing balance support: During functional activity, Bilateral upper extremity supported Standing balance-Leahy Scale: Poor                              Communication    Cognition Arousal: Alert Behavior During Therapy: WFL for tasks assessed/performed   PT - Cognitive impairments: No family/caregiver present to determine baseline                                Cueing    Exercises      General Comments        Pertinent Vitals/Pain Pain Assessment Pain Assessment: No/denies pain    Home Living  Prior Function            PT Goals (current goals can now be found in the care plan section) Progress towards PT goals: Progressing toward goals    Frequency    Min 2X/week      PT Plan      Co-evaluation              AM-PAC PT 6 Clicks Mobility   Outcome Measure  Help needed turning from your back to your side while in a flat bed without using bedrails?: A Little Help needed moving from lying on your back to sitting on the side of a flat bed without using bedrails?: A Little Help needed moving to and from a bed to a chair (including a wheelchair)?: A  Little Help needed standing up from a chair using your arms (e.g., wheelchair or bedside chair)?: A Little Help needed to walk in hospital room?: A Little Help needed climbing 3-5 steps with a railing? : A Little 6 Click Score: 18    End of Session Equipment Utilized During Treatment: Gait belt Activity Tolerance: Patient tolerated treatment well Patient left: with call bell/phone within reach;in chair Nurse Communication: Mobility status PT Visit Diagnosis: Unsteadiness on feet (R26.81);Muscle weakness (generalized) (M62.81)     Time: 8956-8946 PT Time Calculation (min) (ACUTE ONLY): 10 min  Charges:    $Therapeutic Activity: 8-22 mins PT General Charges $$ ACUTE PT VISIT: 1 Visit                     Doyal Shams PT, DPT 12:03 PM,07/22/23

## 2023-07-22 NOTE — Consult Note (Signed)
 PHARMACY CONSULT NOTE - FOLLOW UP  Pharmacy Consult for Electrolyte Monitoring and Replacement   Recent Labs: Potassium (mmol/L)  Date Value  07/22/2023 4.1  05/06/2012 4.1   Magnesium (mg/dL)  Date Value  92/89/7974 2.2   Calcium (mg/dL)  Date Value  92/89/7974 8.5 (L)   Calcium, Total (mg/dL)  Date Value  95/74/7985 9.5   Albumin (g/dL)  Date Value  92/93/7974 2.9 (L)   Phosphorus (mg/dL)  Date Value  92/91/7974 3.0   Sodium (mmol/L)  Date Value  07/22/2023 140  05/06/2012 139     Assessment: 83 y.o. 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 Pharmacy is asked to follow and replace electrolytes. Scr trending up.   On Amio 200 mg BID, on enalapril  20 mg BID, on lasix  40 mg q8H, on spironolactone  12.5 mg daily,   Goal of Therapy:  K>4, Mag>2  Plan:  No replacement needed F/u with AM labs.   Cathaleen GORMAN Blanch ,PharmD Clinical Pharmacist 07/22/2023 7:40 AM

## 2023-07-22 NOTE — Progress Notes (Signed)
 [facility] CLINIC CARDIOLOGY PROGRESS NOTE       Patient ID: Jeffery Joseph MRN: [id number] DOB/AGE: September 11, 1940 83 y.o.  Admit date: 07/17/2023 Referring Physician Dr. Lou Primary Physician Epifanio Alm SQUIBB, MD Primary Cardiologist None Reason for Consultation SOB, elevated trops  HPI: KATAI MARSICO is a 83 y.o. 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  Patient is without chest pain or palpitations.  Cardiology was consulted for further evaluation.    Interval History: -Patient seen and examined this AM and laying comfortably in bedside chair with daughter beside. Patient states he continues to feels good continues to deny any chest pain, SOB or palpitations. -Continues to have 1+ pedal edema but improving. -Patients BP and HR stable this AM. Per tele this AM in SR, rate 50s -Yesterday UOP 1.75L. (Nephro following) -Patient remains on room air with stable SpO2.    Review of systems complete and found to be negative unless listed above    Past Medical History:  Diagnosis Date   Anxiety    Arthritis    Depression    Diabetes mellitus without complication (HCC)    Hypertension    Pneumonia    Renal disorder     Past Surgical History:  Procedure Laterality Date   A/V FISTULAGRAM Left 06/08/2023   Procedure: A/V Fistulagram;  Surgeon: Jama Cordella MATSU, MD;  Location: [facility] INVASIVE CV LAB;  Service: Cardiovascular;  Laterality: Left;   APPLICATION OF WOUND VAC Right 11/14/2021   Procedure: APPLICATION OF WOUND VAC;  Surgeon: Jama Cordella MATSU, MD;  Location: [facility] ORS;  Service: Vascular;  Laterality: Right;   AV FISTULA PLACEMENT Left 11/14/2021   Procedure: ARTERIOVENOUS (AV) FISTULA CREATION ( BRACHIAL CEPHALIC);  Surgeon: Jama Cordella MATSU, MD;   Location: [facility] ORS;  Service: Vascular;  Laterality: Left;    Medications Prior to Admission  Medication Sig Dispense Refill Last Dose/Taking   acetaminophen  (TYLENOL ) 325 MG tablet Take 650 mg by mouth every 6 (six) hours as needed.   Unknown   ALPRAZolam  (XANAX ) 0.5 MG tablet Take 0.5 mg by mouth 3 (three) times daily as needed for sleep.   07/17/2023 Morning   amLODipine  (NORVASC ) 10 MG tablet Take 10 mg by mouth daily.   07/17/2023 Morning   aspirin  EC 81 MG tablet Take 81 mg by mouth daily. Swallow whole.   07/17/2023 Morning   Cholecalciferol  125 MCG (5000 UT) TABS Take 5,000 Units by mouth daily.   07/17/2023 Morning   clopidogrel  (PLAVIX ) 75 MG tablet Take 1 tablet (75 mg total) by mouth daily. 30 tablet 11 07/17/2023 Morning   Docusate Sodium  (DSS) 100 MG CAPS Take 100 mg by mouth at bedtime.   07/16/2023 Bedtime   donepezil  (ARICEPT ) 10 MG tablet Take 10 mg by mouth at bedtime.   07/16/2023 Bedtime   enalapril  (VASOTEC ) 20 MG tablet Take 20 mg by mouth 2 (two) times daily.   07/17/2023 Evening   finasteride  (PROSCAR ) 5 MG tablet Take 1 tablet (5 mg total) by mouth daily. 90 tablet 3 07/17/2023 Morning   glipiZIDE  (GLUCOTROL  XL) 5 MG 24 hr tablet Take by mouth.   07/17/2023 Morning   hydrALAZINE  (APRESOLINE ) 50 MG tablet Take 100 mg by mouth 3 (three) times daily.   07/17/2023 Evening   LOKELMA  10 g PACK packet Take 1 packet by mouth daily.   07/17/2023 Morning   metoprolol  tartrate (LOPRESSOR ) 25 MG tablet Take 25 mg by mouth 2 (two) times daily in the am and at bedtime..   07/17/2023 Morning   sodium bicarbonate  650 MG tablet Take 650 mg by mouth 2 (two) times daily.   07/17/2023 Evening   tamsulosin  (FLOMAX ) 0.4 MG CAPS capsule Take 1 capsule (0.4 mg total) by mouth daily. 90 capsule 3 07/17/2023 Morning   Patiromer Sorbitex Calcium 16.8 g PACK Take by mouth. (Patient not taking: Reported on 07/17/2023)   Not Taking   Social History   Socioeconomic History   Marital status: Married    Spouse name: ,Loraine    Number of children: Not on file   Years of education: Not on file   Highest education level: Not on file  Occupational History   Not on file  Tobacco Use   Smoking status: Never   Smokeless tobacco: Never  Substance and Sexual Activity   Alcohol use: No   Drug use: Not Currently   Sexual activity: Not Currently  Other Topics Concern   Not on file  Social History Narrative   Not on file   Social Drivers of Health   Financial Resource Strain: Low Risk  (11/25/2022)   Received from [facility]   Overall Financial Resource Strain (CARDIA)    Difficulty of Paying Living Expenses: Not hard at all  Food Insecurity: No Food Insecurity (07/18/2023)   Hunger Vital Sign    Worried About Running Out of Food in the Last Year: Never true    Ran Out of Food in the Last Year: Never true  Transportation Needs: No Transportation Needs (07/18/2023)   PRAPARE - Administrator, Civil Service (Medical): No    Lack of Transportation (Non-Medical): No  Physical Activity: Not on file  Stress: Not on file  Social Connections: Socially Integrated (07/18/2023)   Social Connection and Isolation Panel    Frequency of Communication with Friends and Family: More than three times a week    Frequency of Social Gatherings with Friends and Family: More than three times a week    Attends Religious Services: More than 4 times per year    Active Member of [employer] or Organizations: Yes    Attends Banker Meetings: More than 4 times per year    Marital Status: Married  Catering manager Violence: Not At Risk (07/18/2023)   Humiliation, Afraid, Rape, and Kick questionnaire    Fear of Current or Ex-Partner: No    Emotionally Abused: No    Physically Abused: No    Sexually Abused: No    History reviewed. No pertinent family history.   Vitals:   07/21/23 2337 07/22/23 0247 07/22/23 0430 07/22/23 0730  BP: (!) 111/44 (!) 122/50  (!) 121/45  Pulse: (!) 53 (!) 56  93  Resp: 17 18   17   Temp: 98.6 F (37 C) 98 F (36.7 C)  97.7 F (36.5 C)  TempSrc:      SpO2: 96% 98%  90%  Weight:   119.3 kg   Height:        PHYSICAL EXAM General: Well-appearing elderly male, well nourished, in no acute distress. HEENT: Normocephalic and atraumatic. Neck: No JVD.   Lungs: Normal respiratory effort on room air. Clear bilaterally to auscultation. No wheezes, crackles, rhonchi.  Heart: Irregular, elevated rates. Normal S1 and S2 without gallops or murmurs.  Abdomen: Non-distended appearing.  Msk: Normal strength and tone for age. Extremities: Warm and well perfused. No clubbing, cyanosis. 1+ pedal edema.  Neuro: Alert and oriented X 3. Psych: Answers questions appropriately.  Labs: Basic Metabolic Panel: Recent Labs    07/20/23 0447 07/21/23 0452 07/22/23 0453  NA 143 141 140  K 3.6 3.7 4.1  CL 105 102 102  CO2 28 27 27   GLUCOSE 89 84 77  BUN 51* 59* 61*  CREATININE 3.69* 4.11* 3.95*  CALCIUM 8.8* 8.7* 8.5*  MG 2.2 2.3 2.2  PHOS 3.0  --   --    Liver Function Tests: No results for input(s): AST, ALT, ALKPHOS, BILITOT, PROT, ALBUMIN in the last 72 hours.  No results for input(s): LIPASE, AMYLASE in the last 72 hours. CBC: Recent Labs    07/21/23 0452 07/22/23 0453  WBC 6.0 5.1  HGB 9.2* 9.1*  HCT 29.7* 29.3*  MCV 81.6 81.8  PLT 197 207   Cardiac Enzymes: No results for input(s): CKTOTAL, CKMB, CKMBINDEX, TROPONINIHS in the last 72 hours.  BNP: No results for input(s): BNP in the last 72 hours.  D-Dimer: No results for input(s): DDIMER in the last 72 hours. Hemoglobin A1C: No results for input(s): HGBA1C in the last 72 hours.  Fasting Lipid Panel: No results for input(s): CHOL, HDL, LDLCALC, TRIG, CHOLHDL, LDLDIRECT in the last 72 hours. Thyroid Function Tests: Recent Labs    07/20/23 0446  TSH 1.633   Anemia Panel: No results for input(s): VITAMINB12, FOLATE, FERRITIN, TIBC, IRON,  RETICCTPCT in the last 72 hours.   Radiology: DG Chest 2 View Result Date: 07/19/2023 CLINICAL DATA:  [id number] Dyspnea [id number] EXAM: CHEST - 2 VIEW COMPARISON:  July 18, 2023 FINDINGS: Lower lung volumes. Bilateral perihilar interstitial opacities throughout both lungs. Hazy airspace opacities within the lung bases with blunting of the costophrenic sulci. No pneumothorax. Mild cardiomegaly. Tortuous aorta with aortic atherosclerosis. No acute fracture or destructive lesions. Multilevel thoracic osteophytosis. IMPRESSION: Lower lung volumes. Redemonstrated cardiomegaly with findings of pulmonary edema. Increasing hazy airspace opacities within the lung bases, possibly due to pleural effusions and atelectasis or worsening edema. Electronically Signed   By: Rogelia Myers M.D.   On: 07/19/2023 08:22   DG Chest Port 1 View Result Date: 07/18/2023 CLINICAL DATA:  Dyspnea EXAM: PORTABLE CHEST 1 VIEW COMPARISON:  07/17/2023 FINDINGS: Cardiomegaly with vascular congestion and mild diffuse interstitial and ground-glass opacity, improved compared with 07/17/2023. Small bilateral effusions. No pneumothorax. Stent in the left axillary region IMPRESSION: Cardiomegaly with vascular congestion and edema, improved compared with 07/17/2023. Small bilateral effusions. Electronically Signed   By: Luke Bun M.D.   On: 07/18/2023 20:14   ECHOCARDIOGRAM COMPLETE Result Date: 07/18/2023    ECHOCARDIOGRAM REPORT   Patient Name:   KENZEL RUESCH Date of Exam: 07/18/2023 Medical Rec #:  [id number]     Height:       77.0 in Accession #:    [id number]    Weight:       261.0 lb Date of Birth:  04-Jul-1940     BSA:          2.505 m Patient Age:    82 years      BP:           157/58 mmHg Patient Gender: M             HR:           63 bpm. Exam Location:  [facility] Procedure: 2D Echo,  Cardiac Doppler and Color Doppler (Both Spectral and Color            Flow Doppler were utilized during procedure). Indications:     Dyspnea R06.00  History:          Patient has no prior history of Echocardiogram examinations.  Sonographer:     Thedora Louder RDCS, FASE Referring Phys:  [id number] CLARETTA HERO AMPONSAH Diagnosing Phys: Cara JONETTA Lovelace MD  Sonographer Comments: This study was performed with the patient positioned supine. IMPRESSIONS  1. Left ventricular ejection fraction, by estimation, is 55 to 60%. The left ventricle has normal function. The left ventricle has no regional wall motion abnormalities. The left ventricular internal cavity size was mildly dilated. There is moderate concentric left ventricular hypertrophy. Left ventricular diastolic parameters are consistent with Grade III diastolic dysfunction (restrictive).  2. Right ventricular systolic function is normal. The right ventricular size is normal.  3. Left atrial size was moderately dilated.  4. Right atrial size was mild to moderately dilated.  5. The mitral valve is grossly normal. Trivial mitral valve regurgitation.  6. Tricuspid valve regurgitation is mild to moderate.  7. The aortic valve is grossly normal. Aortic valve regurgitation is mild. Mild aortic valve stenosis. FINDINGS  Left Ventricle: Left ventricular ejection fraction, by estimation, is 55 to 60%. The left ventricle has normal function. The left ventricle has no regional wall motion abnormalities. Strain was performed and the global longitudinal strain is indeterminate. Global longitudinal strain performed but not reported based on interpreter judgement due to suboptimal tracking. The left ventricular internal cavity size was mildly dilated. There is moderate concentric left ventricular hypertrophy. Left ventricular diastolic parameters are consistent with Grade III diastolic dysfunction (restrictive). Right Ventricle: The right ventricular size is normal. No increase in right ventricular wall thickness. Right ventricular systolic function is normal. Left Atrium: Left atrial size was moderately dilated. Right Atrium: Right atrial size  was mild to moderately dilated. Pericardium: There is no evidence of pericardial effusion. Mitral Valve: The mitral valve is grossly normal. Trivial mitral valve regurgitation. Tricuspid Valve: The tricuspid valve is normal in structure. Tricuspid valve regurgitation is mild to moderate. Aortic Valve: The aortic valve is grossly normal. Aortic valve regurgitation is mild. Aortic regurgitation PHT measures 505 msec. Mild aortic stenosis is present. Aortic valve mean gradient measures 20.8 mmHg. Aortic valve peak gradient measures 41.7 mmHg. Aortic valve area, by VTI measures 1.61 cm. Pulmonic Valve: The pulmonic valve was normal in structure. Pulmonic valve regurgitation is not visualized. Aorta: The ascending aorta was not well visualized. IAS/Shunts: No atrial level shunt detected by color flow Doppler. Additional Comments: 3D was performed not requiring image post processing on an independent workstation and was indeterminate.  LEFT VENTRICLE PLAX 2D LVIDd:         5.50 cm   Diastology LVIDs:         3.90 cm   LV e' medial:    8.16 cm/s LV PW:         1.50 cm   LV E/e' medial:  19.9 LV IVS:        1.60 cm   LV e' lateral:   12.50 cm/s LVOT diam:     2.10 cm   LV E/e' lateral: 13.0 LV SV:         116 LV SV Index:   46 LVOT Area:     3.46 cm  RIGHT VENTRICLE RV Basal diam:  3.60 cm RV S prime:  14.30 cm/s TAPSE (M-mode): 2.6 cm LEFT ATRIUM             Index        RIGHT ATRIUM           Index LA diam:        5.00 cm 2.00 cm/m   RA Area:     25.20 cm LA Vol (A2C):   93.0 ml 37.13 ml/m  RA Volume:   79.90 ml  31.90 ml/m LA Vol (A4C):   89.0 ml 35.53 ml/m LA Biplane Vol: 94.0 ml 37.53 ml/m  AORTIC VALVE                     PULMONIC VALVE AV Area (Vmax):    1.58 cm      PV Vmax:          1.33 m/s AV Area (Vmean):   1.57 cm      PV Peak grad:     7.1 mmHg AV Area (VTI):     1.61 cm      PR End Diast Vel: 11.42 msec AV Vmax:           323.00 cm/s   RVOT Peak grad:   7 mmHg AV Vmean:          211.000 cm/s AV  VTI:            0.725 m AV Peak Grad:      41.7 mmHg AV Mean Grad:      20.8 mmHg LVOT Vmax:         147.00 cm/s LVOT Vmean:        95.400 cm/s LVOT VTI:          0.336 m LVOT/AV VTI ratio: 0.46 AI PHT:            505 msec  AORTA Ao Root diam: 3.30 cm Ao Asc diam:  2.60 cm MITRAL VALVE                TRICUSPID VALVE MV Area (PHT): 3.42 cm     TR Peak grad:   59.0 mmHg MV Decel Time: 222 msec     TR Vmax:        384.00 cm/s MR Peak grad: 168.5 mmHg MR Mean grad: 117.0 mmHg    SHUNTS MR Vmax:      649.00 cm/s   Systemic VTI:  0.34 m MR Vmean:     513.0 cm/s    Systemic Diam: 2.10 cm MV E velocity: 162.00 cm/s MV A velocity: 73.90 cm/s MV E/A ratio:  2.19 Dwayne D Callwood MD Electronically signed by Cara JONETTA Lovelace MD Signature Date/Time: 07/18/2023/8:39:21 AM    Final    DG Chest Portable 1 View Result Date: 07/17/2023 CLINICAL DATA:  sob EXAM: PORTABLE CHEST 1 VIEW COMPARISON:  None Available. FINDINGS: Cardiac enlargement with some portion likely due to AP portable technique. Otherwise the heart and mediastinal contours are within normal limits. No focal consolidation. Pulmonary edema. No pleural effusion. No pneumothorax. No acute osseous abnormality. IMPRESSION: 1. Pulmonary edema. 2. Cardiac enlargement with some portion likely due to AP portable technique. 3. Consider repeat chest x-ray PA and lateral view for further evaluation. Electronically Signed   By: Morgane  Naveau M.D.   On: 07/17/2023 21:07   VAS US  DUPLEX DIALYSIS ACCESS (AVF, AVG) Result Date: 07/14/2023 DIALYSIS ACCESS Patient Name:  CALLIN ASHE  Date of Exam:   07/12/2023 Medical Rec #: [id number]  Accession #:    [id number] Date of Birth: 13-May-1940      Patient Gender: M Patient Age:   86 years Exam Location:  [facility] Procedure:      VAS US  DUPLEX DIALYSIS ACCESS (AVF, AVG) Referring Phys: CORDELLA SHAWL --------------------------------------------------------------------------------  Reason for Exam: Routine follow up.  Access Site: Left Upper Extremity. Access Type: Brachial-cephalic AVF. History: Created 11/2021          Stent placed at cephalic / subclavian confluence 06/08/2023. Performing Technologist: Donnice Charnley RVT  Examination Guidelines: A complete evaluation includes B-mode imaging, spectral Doppler, color Doppler, and power Doppler as needed of all accessible portions of each vessel. Unilateral testing is considered an integral part of a complete examination. Limited examinations for reoccurring indications may be performed as noted.  Findings: +--------------------+----------+-----------------+--------+ AVF                 PSV (cm/s)Flow Vol (mL/min)Comments +--------------------+----------+-----------------+--------+ Native artery inflow   234          2520                +--------------------+----------+-----------------+--------+ AVF Anastomosis        609                              +--------------------+----------+-----------------+--------+  +---------------+----------+-------------+----------+--------+ OUTFLOW VEIN   PSV (cm/s)Diameter (cm)Depth (cm)Describe +---------------+----------+-------------+----------+--------+ Subclavian vein    85                                    +---------------+----------+-------------+----------+--------+ Confluence        282        0.73                        +---------------+----------+-------------+----------+--------+ Clavicle          214        0.78                        +---------------+----------+-------------+----------+--------+ Shoulder          148        0.74                        +---------------+----------+-------------+----------+--------+ Prox UA           172        0.60                        +---------------+----------+-------------+----------+--------+ Mid UA            172        0.67                        +---------------+----------+-------------+----------+--------+ Dist UA           167         0.84                        +---------------+----------+-------------+----------+--------+ AC Fossa          706        0.90                        +---------------+----------+-------------+----------+--------+   Summary: Patent arteriovenous  fistula. *See table(s) above for measurements and observations.  Diagnosing physician: Cordella Shawl MD Electronically signed by Cordella Shawl MD on 07/14/2023 at 3:14:43 PM.   --------------------------------------------------------------------------------   Final     ECHO as above  TELEMETRY reviewed by me 07/22/2023: Sinus rhythm with PACs, rate 50s  EKG reviewed by me: Normal sinus rhythm multiple PVCs and PACs rate of 90   Data reviewed by me 07/22/2023: last 24h vitals tele labs imaging I/O hospitalist progress notes.  Principal Problem:   Acute pulmonary edema (HCC) Active Problems:   Elevated troponin   Hypertensive emergency   SOB (shortness of breath)   Hypokalemia    ASSESSMENT AND PLAN:  ZAMEER BORMAN is a 83 y.o. 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  Patient is without chest pain or palpitations.  Cardiology was consulted for further evaluation.   # New onset Atrial flutter RVR Patient without palpitations, chest pain, SOB or lightheadedness. Patient with no known history of atrial fibrillation/flutter. EKG today and per tele with atrial flutter rate 120-130s. Patient started on IV amio gtt, however d/c on 07.07 in evening due to reported bradycardia. Per tele this AM in atrial flutter with rate sustaining 120s. Restarted IV amio. Per tele patient converted to SR on 07/08 in afternoon s/p IV amio. Patient ahs remains in SR rate 50s. -Monitor and replenish electrolytes for a goal K >4, Mag >2   -Continue PO amio 400 mg BID for 10 days, to 200 mg daily. .  -Decreased metoprolol  to metoprolol  succinate 12.5 mg daily. -Continue PO Eliquis  2.5 mg BID for stroke risk reduction. (Reduced dose due to age and renal function).          -Recommend discussion with vascular to establish if deemed necessary to continue Plavix  now patient is on Eliquis . -CHA2DS2VASc score of 6  (in the setting of age, CHF, HTN, vascular disease, and diabetes history).  -Discussed atrial flutter with EP and recommended outpatient follow-up for ablation. EP appointment scheduled with Dr. Daulbert on 07/23 at 11 AM.  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  BNP elevated 2400 > 2000. CXR with cardiomegaly and pulmonary edema.  Echo his admission with preserved EF, grade 3 diastolic dysfunction, moderate concentric LVH.  -Monitor and replenish electrolytes for a goal K >4, Mag >2  -Nephrology consulted, appreciate recommendations. -Lasix  on hold per nephrology. -Hold spironolactone  per nephrology. -Continue metoprolol  to tartrate to 12.5 mg 3 times daily. Will uptitrate as BP allows. -Not candidate for SGLT2i at this time due to low GFR. -Plan to optimize GDMT as BP and renal function allows.  -Patient continues to make urine, no plan for HD per nephrology.  # Demand ischemia  # Hypertension Patient without chest pain. Trops elevated and flat 150 > 250 > 440 > 540 > 590 > 600 > 540.  EKG without acute ischemic changes.  Echo this admission with preserved EF, no RWMA. -Completed at least 48 hrs of heparin  infusion. -Hold home amlodipine  10 mg daily and hydralazine  due to borderline BP, allow room for uptitration of rate control medications.  -Metoprolol  stated above.  No further cardiac recommendations at this time.  Will sign  off. Ok for discharge today from a cardiac perspective. Will arrange for follow up in clinic with  Caralyn Hudson in 1 week and Dr. Florencio in 2 weeks. Patient has EP appointment scheduled with Dr. Daulbert on 07/23 at 11 AM.  This patient's plan of care was discussed and created with Dr. Florencio and he is in agreement.  Signed: Dorene Comfort, PA-C  07/22/2023, 10:02 AM [facility] Cardiology

## 2023-07-22 NOTE — Progress Notes (Signed)
 [facility]  ROUNDING NOTE   Subjective:   [name] was admitted to [facility] on 07/17/2023 for Acute pulmonary edema (HCC) [J81.0] SOB (shortness of breath) [R06.02] Elevated troponin [R79.89]  Patient was having 3 to 4 days of progressive shortness of breath and peripheral edema.   Last seen by Nephrology on 5/7 by Dr. Marcelino.   Update Patient seen sitting up in chair Family at bedside Preparing for breakfast Remains on room air Lower extremity has improved since admission Complaining of left knee and ankle pain  Creatinine 3.95 UOP 1750 mL  Objective:  Vital signs in last 24 hours:  Temp:  [97.7 F (36.5 C)-98.8 F (37.1 C)] 98.6 F (37 C) (07/10 1123) Pulse Rate:  [50-93] 53 (07/10 1123) Resp:  [17-20] 19 (07/10 1123) BP: (98-134)/(44-52) 134/51 (07/10 1123) SpO2:  [90 %-100 %] 100 % (07/10 1123) Weight:  [119.3 kg] 119.3 kg (07/10 0430)  Weight change: -0.304 kg Filed Weights   07/20/23 0437 07/21/23 0448 07/22/23 0430  Weight: 125.3 kg 119.6 kg 119.3 kg    Intake/Output: I/O last 3 completed shifts: In: 1776.7 [P.O.:960; I.V.:616.7; IV Piggyback:200] Out: 2750 [Urine:2750]   Intake/Output this shift:  Total I/O In: 240 [P.O.:240] Out: -   Physical Exam: General: NAD, sitting up in chair  Head: Normocephalic, atraumatic. Moist oral mucosal membranes  Eyes: Anicteric  Lungs:  Diminished  Heart: Regular rate and rhythm  Abdomen:  Soft, nontender, +abdominal wall edema  Extremities:  1+ peripheral edema  Neurologic: Alert, awake  Skin: No lesions  Access: Left AVF +thrill +bruit    Basic Metabolic Panel: Recent Labs  Lab 07/18/23 0342 07/19/23 0748 07/20/23 0447 07/21/23 0452 07/22/23 0453  NA 142 144  143 143 141 140  K 3.2* 3.6  3.6 3.6 3.7 4.1  CL 109 104  103 105 102 102  CO2 23 27  26 28 27 27   GLUCOSE 110* 94  93 89 84 77  BUN 39* 43*  42* 51* 59* 61*  CREATININE 3.00* 3.26*  3.31* 3.69* 4.11* 3.95*   CALCIUM 8.4* 9.1  9.1 8.8* 8.7* 8.5*  MG  --  2.0 2.2 2.3 2.2  PHOS 2.9  --  3.0  --   --     Liver Function Tests: Recent Labs  Lab 07/17/23 2042 07/18/23 0342  AST 23  --   ALT 35  --   ALKPHOS 54  --   BILITOT 1.0  --   PROT 6.6  --   ALBUMIN 3.2* 2.9*   No results for input(s): LIPASE, AMYLASE in the last 168 hours. No results for input(s): AMMONIA in the last 168 hours.  CBC: Recent Labs  Lab 07/18/23 0454 07/19/23 0748 07/20/23 0447 07/21/23 0452 07/22/23 0453  WBC 6.7 8.1 7.9 6.0 5.1  HGB 9.5* 11.1* 10.0* 9.2* 9.1*  HCT 30.8* 36.0* 31.2* 29.7* 29.3*  MCV 83.9 83.3 82.5 81.6 81.8  PLT 176 221 193 197 207    Cardiac Enzymes: No results for input(s): CKTOTAL, CKMB, CKMBINDEX, TROPONINI in the last 168 hours.  BNP: Invalid input(s): POCBNP  CBG: Recent Labs  Lab 07/21/23 1346 07/21/23 1644 07/21/23 2103 07/22/23 0731 07/22/23 1145  GLUCAP 158* 78 85 88 90    Microbiology: Results for orders placed or performed during the hospital encounter of 07/17/23  Resp panel by RT-PCR (RSV, Flu A&B, Covid) Anterior Nasal Swab     Status: None   Collection Time: 07/17/23  8:42 PM  Specimen: Anterior Nasal Swab  Result Value Ref Range Status   SARS Coronavirus 2 by RT PCR NEGATIVE NEGATIVE Final    Comment: (NOTE) SARS-CoV-2 target nucleic acids are NOT DETECTED.  The SARS-CoV-2 RNA is generally detectable in upper respiratory specimens during the acute phase of infection. The lowest concentration of SARS-CoV-2 viral copies this assay can detect is 138 copies/mL. A negative result does not preclude SARS-Cov-2 infection and should not be used as the sole basis for treatment or other patient management decisions. A negative result may occur with  improper specimen collection/handling, submission of specimen other than nasopharyngeal swab, presence of viral mutation(s) within the areas targeted by this assay, and inadequate number of  viral copies(<138 copies/mL). A negative result must be combined with clinical observations, patient history, and epidemiological information. The expected result is Negative.  Fact Sheet for Patients:  BloggerCourse.com  Fact Sheet for Healthcare Providers:  SeriousBroker.it  This test is no t yet approved or cleared by the [place]  FDA and  has been authorized for detection and/or diagnosis of SARS-CoV-2 by FDA under an Emergency Use Authorization (EUA). This EUA will remain  in effect (meaning this test can be used) for the duration of the COVID-19 declaration under Section 564(b)(1) of the Act, 21 U.S.C.section 360bbb-3(b)(1), unless the authorization is terminated  or revoked sooner.       Influenza A by PCR NEGATIVE NEGATIVE Final   Influenza B by PCR NEGATIVE NEGATIVE Final    Comment: (NOTE) The Xpert Xpress SARS-CoV-2/FLU/RSV plus assay is intended as an aid in the diagnosis of influenza from Nasopharyngeal swab specimens and should not be used as a sole basis for treatment. Nasal washings and aspirates are unacceptable for Xpert Xpress SARS-CoV-2/FLU/RSV testing.  Fact Sheet for Patients: BloggerCourse.com  Fact Sheet for Healthcare Providers: SeriousBroker.it  This test is not yet approved or cleared by the [place]  FDA and has been authorized for detection and/or diagnosis of SARS-CoV-2 by FDA under an Emergency Use Authorization (EUA). This EUA will remain in effect (meaning this test can be used) for the duration of the COVID-19 declaration under Section 564(b)(1) of the Act, 21 U.S.C. section 360bbb-3(b)(1), unless the authorization is terminated or revoked.     Resp Syncytial Virus by PCR NEGATIVE NEGATIVE Final    Comment: (NOTE) Fact Sheet for Patients: BloggerCourse.com  Fact Sheet for Healthcare  Providers: SeriousBroker.it  This test is not yet approved or cleared by the [place]  FDA and has been authorized for detection and/or diagnosis of SARS-CoV-2 by FDA under an Emergency Use Authorization (EUA). This EUA will remain in effect (meaning this test can be used) for the duration of the COVID-19 declaration under Section 564(b)(1) of the Act, 21 U.S.C. section 360bbb-3(b)(1), unless the authorization is terminated or revoked.  Performed at [facility], [address] [place], [place] [address]   Blood culture (routine x 2)     Status: None (Preliminary result)   Collection Time: 07/17/23  8:42 PM   Specimen: BLOOD  Result Value Ref Range Status   Specimen Description BLOOD RIGHT FOREARM  Final   Special Requests   Final    BOTTLES DRAWN AEROBIC AND ANAEROBIC Blood Culture adequate volume   Culture   Final    NO GROWTH 4 DAYS Performed at [facility], [address] [place], [place] [address]    Report Status PENDING  Incomplete  Blood culture (routine x 2)     Status: None (Preliminary result)  Collection Time: 07/17/23 10:33 PM   Specimen: BLOOD  Result Value Ref Range Status   Specimen Description BLOOD RIGHT ASSIST CONTROL  Final   Special Requests   Final    BOTTLES DRAWN AEROBIC AND ANAEROBIC Blood Culture results may not be optimal due to an inadequate volume of blood received in culture bottles   Culture   Final    NO GROWTH 4 DAYS Performed at [facility], [address] [place], [place] [address]    Report Status PENDING  Incomplete    Coagulation Studies: No results for input(s): LABPROT, INR in the last 72 hours.   Urinalysis: No results for input(s): COLORURINE, LABSPEC, PHURINE, GLUCOSEU, HGBUR, BILIRUBINUR, KETONESUR, PROTEINUR, UROBILINOGEN, NITRITE, LEUKOCYTESUR in the last 72 hours.  Invalid input(s): APPERANCEUR     Imaging: DG Foot Complete  Left Result Date: 07/22/2023 CLINICAL DATA:  Left foot pain with decreased range of motion. EXAM: LEFT FOOT - COMPLETE 3+ VIEW COMPARISON:  Radiographs 05/06/2012. FINDINGS: Artifact from associated no slip socks. The bones appear demineralized. No evidence of acute fracture or dislocation. Probable posttraumatic changes at the 4th proximal interphalangeal joint. Scattered mild degenerative changes with small bidirectional calcaneal spurs. No definite soft tissue abnormality identified. IMPRESSION: No acute osseous findings. Scattered mild degenerative changes. Electronically Signed   By: Elsie Perone M.D.   On: 07/22/2023 10:22      Medications:      ALPRAZolam   0.5 mg Oral BID   amiodarone   400 mg Oral BID   Followed by   NOREEN ON 07/31/2023] amiodarone   200 mg Oral Daily   apixaban   2.5 mg Oral BID   cholecalciferol   5,000 Units Oral Daily   clopidogrel   75 mg Oral Daily   diclofenac  Sodium  4 g Topical QID   donepezil   10 mg Oral QHS   enalapril   20 mg Oral BID   finasteride   5 mg Oral Daily   glipiZIDE   5 mg Oral Q breakfast   insulin  aspart  0-6 Units Subcutaneous TID WC   metoprolol  succinate  12.5 mg Oral Daily   sodium bicarbonate   650 mg Oral BID   tamsulosin   0.4 mg Oral Daily   acetaminophen  **OR** acetaminophen , ondansetron  **OR** ondansetron  (ZOFRAN ) IV, polyethylene glycol, senna-docusate  Assessment/ Plan:  [name] is a 83 y.o.  male with chronic kidney disease stage IV, hypertension, diabetes mellitus type II, BPH, peripheral arterial disease, and diastolic congestive heart failure who presents on 07/17/2023 for Acute pulmonary edema (HCC) [J81.0] SOB (shortness of breath) [R06.02] Elevated troponin [R79.89]  Chronic Kidney Disease stage IV with proteinuria: baseline creatinine 3.44, GFR of 17.  - Creatinine has improved with held diuretics. - Urine output acceptable - continue enalapril  and sodium bicarb  Acute exacerbation of chronic diastolic  congestive heart failure -Furosemide  and spironolactone  held due to kidney injury - Continue to hold for today.  May consider restarting tomorrow   Hypertension with chronic kidney disease -Holding furosemide  with Spironolactone  - continue enalapril , hydralazine , metoprolol , tamsulosin , and amlodipine  - Blood pressure 134/51, acceptable for this patient  Diabetes mellitus type II with chronic kidney disease: hemoglobin A1c of 5.2% - Primary team to manage SSI  Hypokalemia: with history of hyperkalemia - Oral potassium chloride  supplementation as needed  6.  Acute gout flare, suspected.  Diuretic therapy can induce gout.  Will order oral prednisone  40 mg for discomfort.  Will defer any imaging to primary team.   LOS: 5 Alphonzo Devera 7/10/20251:24 PM

## 2023-07-22 NOTE — Plan of Care (Signed)

## 2023-07-22 NOTE — Progress Notes (Signed)
 PROGRESS NOTE    Jeffery Joseph  FMW:[id number] DOB: 01-21-1940 DOA: 07/17/2023 PCP: Epifanio Alm SQUIBB, MD    Brief Narrative:     83 y.o. 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, T2DM, HTN, PAD, arthritis, anxiety and depression, anemia of chronic disease, chronic hyperkalemia and BPH who presented to the ED for evaluation of shortness of breath and admitted for acute pulmonary edema    7/6: Cardiology and nephrology consult 7/7: transfer to PCU, amio started 7/8: PT, OT eval DME ordered for home, stopping hydralazine  as blood pressure running low, Eliquis  started      Assessment & Plan:   Principal Problem:   Acute pulmonary edema (HCC) Active Problems:   Elevated troponin   Hypertensive emergency   SOB (shortness of breath)   Hypokalemia   # Acute pulmonary edema in the setting of hypertensive emergency # Acute on chronic HFpEF  - Patient with 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  Lasix  on hold on 7/9 due to rising creatinine3 Plan: Continue holding Lasix  for now Strict ins and outs, daily weights Continue with Aldactone  12.5 mg daily Recheck creatinine in a.m.   New onset A.flutter/RVR HR in 120-130s, asymptomatic Was on amiodarone  gtt.  This was weaned off.  Restarted on 7/8.  Heart rate improved. Plan: P.o. amiodarone  Outpatient follow-up in EP clinic  # Hypertensive emergency # Acute symptomatic hypertension Improved.  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   Nephrology following.  Lasix  currently on hold   # T2DM Hemoglobin A1c 5.2.  Well-controlled.  Continue glipizide .   # Anemia of chronic disease Globin stable.  No indication for transfusion # PAD - Continue Plavix .  Stopping aspirin  as started on Eliquis    # BPH - Continue finasteride  and tamsulosin    # Anxiety and depression - Continue as needed Xanax    # Dementia - Continue Aricept    # Constipation Resolved.  Changed to as needed Senokot-S and MiraLAX      DVT prophylaxis: Apixaban  Code Status: DNR Family Communication: Family member at bedside 7/9 Disposition Plan: Status is: Inpatient Remains inpatient appropriate because: Multiple acute issues as above   Level of care: Progressive  Consultants:  Nephrology Cardiology  Procedures:  None  Antimicrobials: None   Subjective: Seen and examined.  Sitting up in chair.  No distress  Objective: Vitals:   07/22/23 0247 07/22/23 0430 07/22/23 0730 07/22/23 1123  BP: (!) 122/50  (!) 121/45 (!) 134/51  Pulse: (!) 56  93 (!) 53  Resp: 18  17 19   Temp: 98 F (36.7 C)  97.7 F (36.5 C) 98.6 F (37 C)  TempSrc:      SpO2: 98%  90% 100%  Weight:  119.3 kg    Height:        Intake/Output Summary (Last 24 hours) at 07/22/2023 1453 Last data filed at 07/22/2023 1435 Gross per 24 hour  Intake 1300 ml  Output 900 ml  Net 400 ml   Filed Weights   07/20/23 0437 07/21/23  0448 07/22/23 0430  Weight: 125.3 kg 119.6 kg 119.3 kg    Examination:  General exam: No acute distress.  Sitting up in chair Respiratory system: Basilar crackles.  Normal work of breathing.  Room air Cardiovascular system: 1 S2, RRR, no murmurs, pedal edema Gastrointestinal system: Abdomen is nondistended, soft and nontender. No organomegaly or masses felt. Normal bowel sounds heard. Central nervous system: Alert and oriented. No focal neurological deficits. Extremities: Symmetric 5 x 5 power. Skin: No rashes, lesions or  ulcers Psychiatry: Judgement and insight appear normal. Mood & affect appropriate.     Data Reviewed: I have personally reviewed following labs and imaging studies  CBC: Recent Labs  Lab 07/18/23 0454 07/19/23 0748 07/20/23 0447 07/21/23 0452 07/22/23 0453  WBC 6.7 8.1 7.9 6.0 5.1  HGB 9.5* 11.1* 10.0* 9.2* 9.1*  HCT 30.8* 36.0* 31.2* 29.7* 29.3*  MCV 83.9 83.3 82.5 81.6 81.8  PLT 176 221 193 197 207   Basic Metabolic Panel: Recent Labs  Lab 07/18/23 0342 07/19/23 0748 07/20/23 0447 07/21/23 0452 07/22/23 0453  NA 142 144  143 143 141 140  K 3.2* 3.6  3.6 3.6 3.7 4.1  CL 109 104  103 105 102 102  CO2 23 27  26 28 27 27   GLUCOSE 110* 94  93 89 84 77  BUN 39* 43*  42* 51* 59* 61*  CREATININE 3.00* 3.26*  3.31* 3.69* 4.11* 3.95*  CALCIUM 8.4* 9.1  9.1 8.8* 8.7* 8.5*  MG  --  2.0 2.2 2.3 2.2  PHOS 2.9  --  3.0  --   --    GFR: Estimated Creatinine Clearance: 20.3 mL/min (A) (by C-G formula based on SCr of 3.95 mg/dL (H)). Liver Function Tests: Recent Labs  Lab 07/17/23 2042 07/18/23 0342  AST 23  --   ALT 35  --   ALKPHOS 54  --   BILITOT 1.0  --   PROT 6.6  --   ALBUMIN 3.2* 2.9*   No results for input(s): LIPASE, AMYLASE in the last 168 hours. No results for input(s): AMMONIA in the last 168 hours. Coagulation Profile: Recent Labs  Lab 07/17/23 2042  INR 1.3*   Cardiac Enzymes: No results for input(s): CKTOTAL, CKMB, CKMBINDEX, TROPONINI in the last 168 hours. BNP (last 3 results) No results for input(s): PROBNP in the last 8760 hours. HbA1C: No results for input(s): HGBA1C in the last 72 hours. CBG: Recent Labs  Lab 07/21/23 1346 07/21/23 1644 07/21/23 2103 07/22/23 0731 07/22/23 1145  GLUCAP 158* 78 85 88 90   Lipid Profile: No results for input(s): CHOL, HDL, LDLCALC, TRIG, CHOLHDL, LDLDIRECT in the last 72 hours. Thyroid Function Tests: Recent Labs    07/20/23 0446  TSH 1.633   Anemia  Panel: No results for input(s): VITAMINB12, FOLATE, FERRITIN, TIBC, IRON, RETICCTPCT in the last 72 hours. Sepsis Labs: Recent Labs  Lab 07/17/23 2042 07/17/23 2344 07/18/23 1902 07/18/23 2200  LATICACIDVEN 1.2 1.2 1.0 0.8    Recent Results (from the past 240 hours)  Resp panel by RT-PCR (RSV, Flu A&B, Covid) Anterior Nasal Swab     Status: None   Collection Time: 07/17/23  8:42 PM   Specimen: Anterior Nasal Swab  Result Value Ref Range Status   SARS Coronavirus 2 by RT PCR NEGATIVE NEGATIVE Final    Comment: (NOTE) SARS-CoV-2 target nucleic acids are NOT DETECTED.  The SARS-CoV-2 RNA is generally detectable in upper respiratory specimens during the acute phase of infection. The lowest  concentration of SARS-CoV-2 viral copies this assay can detect is 138 copies/mL. A negative result does not preclude SARS-Cov-2 infection and should not be used as the sole basis for treatment or other patient management decisions. A negative result may occur with  improper specimen collection/handling, submission of specimen other than nasopharyngeal swab, presence of viral mutation(s) within the areas targeted by this assay, and inadequate number of viral copies(<138 copies/mL). A negative result must be combined with clinical observations, patient history, and epidemiological information. The expected result is Negative.  Fact Sheet for Patients:  BloggerCourse.com  Fact Sheet for Healthcare Providers:  SeriousBroker.it  This test is no t yet approved or cleared by the [place]  FDA and  has been authorized for detection and/or diagnosis of SARS-CoV-2 by FDA under an Emergency Use Authorization (EUA). This EUA will remain  in effect (meaning this test can be used) for the duration of the COVID-19 declaration under Section 564(b)(1) of the Act, 21 U.S.C.section 360bbb-3(b)(1), unless the authorization is terminated  or  revoked sooner.       Influenza A by PCR NEGATIVE NEGATIVE Final   Influenza B by PCR NEGATIVE NEGATIVE Final    Comment: (NOTE) The Xpert Xpress SARS-CoV-2/FLU/RSV plus assay is intended as an aid in the diagnosis of influenza from Nasopharyngeal swab specimens and should not be used as a sole basis for treatment. Nasal washings and aspirates are unacceptable for Xpert Xpress SARS-CoV-2/FLU/RSV testing.  Fact Sheet for Patients: BloggerCourse.com  Fact Sheet for Healthcare Providers: SeriousBroker.it  This test is not yet approved or cleared by the [place]  FDA and has been authorized for detection and/or diagnosis of SARS-CoV-2 by FDA under an Emergency Use Authorization (EUA). This EUA will remain in effect (meaning this test can be used) for the duration of the COVID-19 declaration under Section 564(b)(1) of the Act, 21 U.S.C. section 360bbb-3(b)(1), unless the authorization is terminated or revoked.     Resp Syncytial Virus by PCR NEGATIVE NEGATIVE Final    Comment: (NOTE) Fact Sheet for Patients: BloggerCourse.com  Fact Sheet for Healthcare Providers: SeriousBroker.it  This test is not yet approved or cleared by the [place]  FDA and has been authorized for detection and/or diagnosis of SARS-CoV-2 by FDA under an Emergency Use Authorization (EUA). This EUA will remain in effect (meaning this test can be used) for the duration of the COVID-19 declaration under Section 564(b)(1) of the Act, 21 U.S.C. section 360bbb-3(b)(1), unless the authorization is terminated or revoked.  Performed at [facility], [address] [place], [place] [address]   Blood culture (routine x 2)     Status: None (Preliminary result)   Collection Time: 07/17/23  8:42 PM   Specimen: BLOOD  Result Value Ref Range Status   Specimen Description BLOOD RIGHT FOREARM  Final    Special Requests   Final    BOTTLES DRAWN AEROBIC AND ANAEROBIC Blood Culture adequate volume   Culture   Final    NO GROWTH 4 DAYS Performed at [facility], [address] [place], [place] [address]    Report Status PENDING  Incomplete  Blood culture (routine x 2)     Status: None (Preliminary result)   Collection Time: 07/17/23 10:33 PM   Specimen: BLOOD  Result Value Ref Range Status   Specimen Description BLOOD RIGHT ASSIST CONTROL  Final   Special Requests   Final    BOTTLES DRAWN AEROBIC AND ANAEROBIC Blood Culture results may not be optimal due to an inadequate  volume of blood received in culture bottles   Culture   Final    NO GROWTH 4 DAYS Performed at [facility], [address] [place], [place] [address]    Report Status PENDING  Incomplete         Radiology Studies: DG Foot Complete Left Result Date: 07/22/2023 CLINICAL DATA:  Left foot pain with decreased range of motion. EXAM: LEFT FOOT - COMPLETE 3+ VIEW COMPARISON:  Radiographs 05/06/2012. FINDINGS: Artifact from associated no slip socks. The bones appear demineralized. No evidence of acute fracture or dislocation. Probable posttraumatic changes at the 4th proximal interphalangeal joint. Scattered mild degenerative changes with small bidirectional calcaneal spurs. No definite soft tissue abnormality identified. IMPRESSION: No acute osseous findings. Scattered mild degenerative changes. Electronically Signed   By: Elsie Perone M.D.   On: 07/22/2023 10:22        Scheduled Meds:  ALPRAZolam   0.5 mg Oral BID   amiodarone   400 mg Oral BID   Followed by   NOREEN ON 07/31/2023] amiodarone   200 mg Oral Daily   apixaban   2.5 mg Oral BID   cholecalciferol   5,000 Units Oral Daily   clopidogrel   75 mg Oral Daily   diclofenac  Sodium  4 g Topical QID   donepezil   10 mg Oral QHS   enalapril   20 mg Oral BID   finasteride   5 mg Oral Daily   glipiZIDE   5 mg Oral Q breakfast   insulin  aspart  0-6  Units Subcutaneous TID WC   metoprolol  succinate  12.5 mg Oral Daily   sodium bicarbonate   650 mg Oral BID   tamsulosin   0.4 mg Oral Daily   Continuous Infusions:     LOS: 5 days      Calvin KATHEE Robson, MD [facility]   If 7PM-7AM, please contact night-coverage  07/22/2023, 2:53 PM

## 2023-07-23 DIAGNOSIS — J810 Acute pulmonary edema: Secondary | ICD-10-CM | POA: Diagnosis not present

## 2023-07-23 LAB — RENAL FUNCTION PANEL
Albumin: 2.7 g/dL — ABNORMAL LOW (ref 3.5–5.0)
Anion gap: 13 (ref 5–15)
BUN: 58 mg/dL — ABNORMAL HIGH (ref 8–23)
CO2: 27 mmol/L (ref 22–32)
Calcium: 8.8 mg/dL — ABNORMAL LOW (ref 8.9–10.3)
Chloride: 99 mmol/L (ref 98–111)
Creatinine, Ser: 3.79 mg/dL — ABNORMAL HIGH (ref 0.61–1.24)
GFR, Estimated: 15 mL/min — ABNORMAL LOW (ref >60)
Glucose, Bld: 142 mg/dL — ABNORMAL HIGH (ref 70–99)
Phosphorus: 3.6 mg/dL (ref 2.5–4.6)
Potassium: 4.3 mmol/L (ref 3.5–5.1)
Sodium: 139 mmol/L (ref 135–145)

## 2023-07-23 LAB — GLUCOSE, CAPILLARY
Glucose-Capillary: 137 mg/dL — ABNORMAL HIGH (ref 70–99)
Glucose-Capillary: 127 mg/dL — ABNORMAL HIGH (ref 70–99)
Glucose-Capillary: 107 mg/dL — ABNORMAL HIGH (ref 70–99)
Glucose-Capillary: 169 mg/dL — ABNORMAL HIGH (ref 70–99)

## 2023-07-23 LAB — CBC
HCT: 32.4 % — ABNORMAL LOW (ref 39.0–52.0)
Hemoglobin: 10.3 g/dL — ABNORMAL LOW (ref 13.0–17.0)
MCH: 25.9 pg — ABNORMAL LOW (ref 26.0–34.0)
MCHC: 31.8 g/dL (ref 30.0–36.0)
MCV: 81.6 fL (ref 80.0–100.0)
Platelets: 230 K/uL (ref 150–400)
RBC: 3.97 MIL/uL — ABNORMAL LOW (ref 4.22–5.81)
RDW: 16.2 % — ABNORMAL HIGH (ref 11.5–15.5)
WBC: 6.4 K/uL (ref 4.0–10.5)
nRBC: 0 % (ref 0.0–0.2)

## 2023-07-23 LAB — CULTURE, BLOOD (ROUTINE X 2)
Culture: NO GROWTH
Culture: NO GROWTH
Special Requests: ADEQUATE

## 2023-07-23 LAB — MAGNESIUM: Magnesium: 2.6 mg/dL — ABNORMAL HIGH (ref 1.7–2.4)

## 2023-07-23 MED ORDER — PREDNISONE 20 MG PO TABS
40.0000 mg | ORAL_TABLET | Freq: Once | ORAL | Status: AC
  Administered 2023-07-23: 40 mg via ORAL
  Filled 2023-07-23: qty 2

## 2023-07-23 MED ORDER — FUROSEMIDE 40 MG PO TABS
40.0000 mg | ORAL_TABLET | ORAL | Status: DC
  Administered 2023-07-23: 40 mg via ORAL
  Filled 2023-07-23 (×2): qty 1

## 2023-07-23 NOTE — Progress Notes (Signed)
 PROGRESS NOTE    Jeffery Joseph  FMW:[id number] DOB: 10/02/1940 DOA: 07/17/2023 PCP: Epifanio Alm SQUIBB, MD    Brief Narrative:     83 y.o. 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, T2DM, HTN, PAD, arthritis, anxiety and depression, anemia of chronic disease, chronic hyperkalemia and BPH who presented to the ED for evaluation of shortness of breath and admitted for acute pulmonary edema    7/6: Cardiology and nephrology consult 7/7: transfer to PCU, amio started 7/8: PT, OT eval DME ordered for home, stopping hydralazine  as blood pressure running low, Eliquis  started      Assessment & Plan:   Principal Problem:   Acute pulmonary edema (HCC) Active Problems:   Elevated troponin   Hypertensive emergency   SOB (shortness of breath)   Hypokalemia   # Acute pulmonary edema in the setting of hypertensive emergency # Acute on chronic HFpEF  - Patient with 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  Lasix  on hold on 7/9 due to rising creatinine - Back to baseline volume status Plan: Restart home furosemide  dosing, 40 mg p.o. every other day Continue holding Lasix  for now Strict ins and outs, daily weights Aldactone  on hold Medically ready for discharge   New onset A.flutter/RVR HR in 120-130s, asymptomatic Was on amiodarone  gtt.  This was weaned off.  Restarted on 7/8.  Heart rate improved. Plan: P.o. amiodarone  Outpatient follow-up in EP clinic  # Hypertensive emergency # Acute symptomatic hypertension Improved.  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  Nephrology following.  Lasix  currently on hold   # T2DM Hemoglobin A1c 5.2.  Well-controlled.  Continue glipizide .   # Anemia of chronic disease Globin stable.  No indication for transfusion # PAD - Continue Plavix .  Stopping aspirin  as started on Eliquis    # BPH - Continue finasteride  and tamsulosin    # Anxiety and depression - Continue as needed Xanax    # Dementia - Continue Aricept    # Constipation Resolved.  Changed to as needed Senokot-S and MiraLAX      DVT prophylaxis: Apixaban  Code Status: DNR Family Communication: Family member at bedside 7/9, daughter over phone 7/11 Disposition Plan: Status is: Inpatient Remains inpatient appropriate because: Multiple acute issues as above   Level of care: Progressive  Consultants:  Nephrology Cardiology  Procedures:  None  Antimicrobials: None   Subjective: Seen and examined.  Sitting up in chair.  No distress.  Pain well-controlled.  Objective: Vitals:   07/23/23 0420 07/23/23 0500 07/23/23 0722 07/23/23 1141  BP: (!) 148/60  (!) 131/58 (!) 131/51  Pulse: (!) 53  88 (!) 50  Resp: 18  17 19   Temp: 97.7 F (36.5 C)  98.6 F (37 C) 98.6 F (37 C)  TempSrc: Oral     SpO2: 98%  99% 100%  Weight:  118.9 kg    Height:        Intake/Output Summary (Last 24 hours) at 07/23/2023 1352 Last data filed at 07/23/2023 0900 Gross per 24 hour  Intake 600 ml  Output 1400 ml  Net -800 ml   Filed Weights   07/21/23 0448 07/22/23 0430 07/23/23 0500  Weight: 119.6 kg 119.3 kg 118.9 kg    Examination:  General exam: Acute distress.  Up in chair Respiratory system: Basilar crackles.  Normal work of breathing.  Room air Cardiovascular system: 1 S2, RRR, no murmurs, pedal edema Gastrointestinal system: Abdomen is nondistended, soft and nontender. No organomegaly or masses felt. Normal bowel sounds heard. Central nervous system: Alert and  oriented. No focal neurological deficits. Extremities: Symmetric 5 x 5 power. Skin: No rashes, lesions or ulcers Psychiatry: Judgement and insight appear normal. Mood & affect appropriate.     Data Reviewed: I have personally reviewed following labs and imaging studies  CBC: Recent Labs  Lab 07/19/23 0748 07/20/23 0447 07/21/23 0452 07/22/23 0453 07/23/23 0619  WBC 8.1 7.9 6.0 5.1 6.4  HGB 11.1* 10.0* 9.2* 9.1* 10.3*  HCT 36.0* 31.2* 29.7* 29.3* 32.4*  MCV 83.3 82.5 81.6 81.8 81.6  PLT 221 193 197 207 230   Basic Metabolic Panel: Recent Labs  Lab 07/18/23 0342 07/19/23 0748 07/20/23 0447 07/21/23 0452 07/22/23 0453 07/23/23 0619  NA 142 144  143 143 141 140 139  K 3.2* 3.6  3.6 3.6 3.7 4.1 4.3  CL 109 104  103 105 102 102 99  CO2 23 27  26 28 27 27 27   GLUCOSE 110* 94  93 89 84 77 142*  BUN 39* 43*  42* 51* 59* 61* 58*  CREATININE 3.00* 3.26*  3.31* 3.69* 4.11* 3.95* 3.79*  CALCIUM 8.4* 9.1  9.1 8.8* 8.7* 8.5* 8.8*  MG  --  2.0 2.2 2.3 2.2 2.6*  PHOS 2.9  --  3.0  --   --  3.6   GFR: Estimated Creatinine Clearance: 21.1 mL/min (A) (by C-G formula based on SCr of 3.79 mg/dL (H)). Liver Function Tests: Recent Labs  Lab 07/17/23 2042 07/18/23 0342 07/23/23 0619  AST 23  --   --   ALT 35  --   --   ALKPHOS 54  --   --   BILITOT 1.0  --   --   PROT 6.6  --   --   ALBUMIN 3.2* 2.9* 2.7*   No results for input(s): LIPASE, AMYLASE in the last 168 hours. No results for input(s): AMMONIA in the last 168 hours. Coagulation Profile: Recent Labs  Lab 07/17/23 2042  INR 1.3*   Cardiac Enzymes: No results for input(s): CKTOTAL, CKMB, CKMBINDEX, TROPONINI in the last 168 hours. BNP (last 3 results) No results for input(s): PROBNP in the last 8760 hours. HbA1C: No results for input(s): HGBA1C in the last 72 hours. CBG: Recent Labs  Lab 07/22/23 1145 07/22/23 1553 07/22/23 2144 07/23/23 0719 07/23/23 1202  GLUCAP 90 117* 170*  137* 127*   Lipid Profile: No results for input(s): CHOL, HDL, LDLCALC, TRIG, CHOLHDL, LDLDIRECT in the last 72 hours. Thyroid Function Tests: No results for input(s): TSH, T4TOTAL, FREET4, T3FREE, THYROIDAB in the last 72 hours.  Anemia Panel: No results for input(s): VITAMINB12, FOLATE, FERRITIN, TIBC, IRON, RETICCTPCT in the last 72 hours. Sepsis Labs: Recent Labs  Lab 07/17/23 2042 07/17/23 2344 07/18/23 1902 07/18/23 2200  LATICACIDVEN 1.2 1.2 1.0 0.8    Recent Results (from the past 240 hours)  Resp panel by RT-PCR (RSV, Flu A&B, Covid) Anterior Nasal Swab     Status: None   Collection Time: 07/17/23  8:42 PM   Specimen:  Anterior Nasal Swab  Result Value Ref Range Status   SARS Coronavirus 2 by RT PCR NEGATIVE NEGATIVE Final    Comment: (NOTE) SARS-CoV-2 target nucleic acids are NOT DETECTED.  The SARS-CoV-2 RNA is generally detectable in upper respiratory specimens during the acute phase of infection. The lowest concentration of SARS-CoV-2 viral copies this assay can detect is 138 copies/mL. A negative result does not preclude SARS-Cov-2 infection and should not be used as the sole basis for treatment or other patient management decisions. A negative result may occur with  improper specimen collection/handling, submission of specimen other than nasopharyngeal swab, presence of viral mutation(s) within the areas targeted by this assay, and inadequate number of viral copies(<138 copies/mL). A negative result must be combined with clinical observations, patient history, and epidemiological information. The expected result is Negative.  Fact Sheet for Patients:  BloggerCourse.com  Fact Sheet for Healthcare Providers:  SeriousBroker.it  This test is no t yet approved or cleared by the [place]  FDA and  has been authorized for detection and/or diagnosis of SARS-CoV-2 by FDA under  an Emergency Use Authorization (EUA). This EUA will remain  in effect (meaning this test can be used) for the duration of the COVID-19 declaration under Section 564(b)(1) of the Act, 21 U.S.C.section 360bbb-3(b)(1), unless the authorization is terminated  or revoked sooner.       Influenza A by PCR NEGATIVE NEGATIVE Final   Influenza B by PCR NEGATIVE NEGATIVE Final    Comment: (NOTE) The Xpert Xpress SARS-CoV-2/FLU/RSV plus assay is intended as an aid in the diagnosis of influenza from Nasopharyngeal swab specimens and should not be used as a sole basis for treatment. Nasal washings and aspirates are unacceptable for Xpert Xpress SARS-CoV-2/FLU/RSV testing.  Fact Sheet for Patients: BloggerCourse.com  Fact Sheet for Healthcare Providers: SeriousBroker.it  This test is not yet approved or cleared by the [place]  FDA and has been authorized for detection and/or diagnosis of SARS-CoV-2 by FDA under an Emergency Use Authorization (EUA). This EUA will remain in effect (meaning this test can be used) for the duration of the COVID-19 declaration under Section 564(b)(1) of the Act, 21 U.S.C. section 360bbb-3(b)(1), unless the authorization is terminated or revoked.     Resp Syncytial Virus by PCR NEGATIVE NEGATIVE Final    Comment: (NOTE) Fact Sheet for Patients: BloggerCourse.com  Fact Sheet for Healthcare Providers: SeriousBroker.it  This test is not yet approved or cleared by the [place]  FDA and has been authorized for detection and/or diagnosis of SARS-CoV-2 by FDA under an Emergency Use Authorization (EUA). This EUA will remain in effect (meaning this test can be used) for the duration of the COVID-19 declaration under Section 564(b)(1) of the Act, 21 U.S.C. section 360bbb-3(b)(1), unless the authorization is terminated or revoked.  Performed at [facility], [address] [place], [place] [address]   Blood culture (routine x 2)     Status: None (Preliminary result)   Collection Time: 07/17/23  8:42 PM   Specimen: BLOOD  Result Value Ref Range Status   Specimen Description BLOOD RIGHT FOREARM  Final   Special Requests   Final    BOTTLES DRAWN AEROBIC AND ANAEROBIC Blood Culture adequate volume   Culture   Final    NO GROWTH 4 DAYS Performed at [facility], [address] [place], [place] [address]    Report Status PENDING  Incomplete  Blood culture (routine x 2)     Status: None (Preliminary result)  Collection Time: 07/17/23 10:33 PM   Specimen: BLOOD  Result Value Ref Range Status   Specimen Description BLOOD RIGHT ASSIST CONTROL  Final   Special Requests   Final    BOTTLES DRAWN AEROBIC AND ANAEROBIC Blood Culture results may not be optimal due to an inadequate volume of blood received in culture bottles   Culture   Final    NO GROWTH 4 DAYS Performed at [facility], [address], [place], [place] [address]    Report Status PENDING  Incomplete         Radiology Studies: DG Foot Complete Left Result Date: 07/22/2023 CLINICAL DATA:  Left foot pain with decreased range of motion. EXAM: LEFT FOOT - COMPLETE 3+ VIEW COMPARISON:  Radiographs 05/06/2012. FINDINGS: Artifact from associated no slip socks. The bones appear demineralized. No evidence of acute fracture or dislocation. Probable posttraumatic changes at the 4th proximal interphalangeal joint. Scattered mild degenerative changes with small bidirectional calcaneal spurs. No definite soft tissue abnormality identified. IMPRESSION: No acute osseous findings. Scattered mild degenerative changes. Electronically Signed   By: Elsie Perone M.D.   On: 07/22/2023 10:22        Scheduled Meds:  ALPRAZolam   0.5 mg Oral BID   amiodarone   400 mg Oral BID   Followed by   NOREEN ON 07/31/2023] amiodarone   200 mg Oral Daily   apixaban   2.5 mg Oral BID    cholecalciferol   5,000 Units Oral Daily   clopidogrel   75 mg Oral Daily   diclofenac  Sodium  4 g Topical QID   donepezil   10 mg Oral QHS   enalapril   20 mg Oral BID   finasteride   5 mg Oral Daily   glipiZIDE   5 mg Oral Q breakfast   insulin  aspart  0-6 Units Subcutaneous TID WC   metoprolol  succinate  12.5 mg Oral Daily   predniSONE   40 mg Oral Once   sodium bicarbonate   650 mg Oral BID   tamsulosin   0.4 mg Oral Daily   Continuous Infusions:     LOS: 6 days      Jeffery KATHEE Robson, MD [facility]   If 7PM-7AM, please contact night-coverage  07/23/2023, 1:52 PM

## 2023-07-23 NOTE — Progress Notes (Signed)
 [facility]  ROUNDING NOTE   Subjective:   [name] was admitted to [facility] on 07/17/2023 for Acute pulmonary edema (HCC) [J81.0] SOB (shortness of breath) [R06.02] Elevated troponin [R79.89]  Patient was having 3 to 4 days of progressive shortness of breath and peripheral edema.   Last seen by Nephrology on 5/7 by Dr. Marcelino.   Update Patient seen sitting up in bed Remains on room air Lower extremity edema at baseline  Creatinine 3.79 UOP 1400 mL  Objective:  Vital signs in last 24 hours:  Temp:  [97.6 F (36.4 C)-98.6 F (37 C)] 98.6 F (37 C) (07/11 1141) Pulse Rate:  [50-88] 50 (07/11 1141) Resp:  [17-20] 19 (07/11 1141) BP: (119-148)/(50-60) 131/51 (07/11 1141) SpO2:  [96 %-100 %] 100 % (07/11 1141) Weight:  [118.9 kg] 118.9 kg (07/11 0500)  Weight change: -0.396 kg Filed Weights   07/21/23 0448 07/22/23 0430 07/23/23 0500  Weight: 119.6 kg 119.3 kg 118.9 kg    Intake/Output: I/O last 3 completed shifts: In: 1060 [P.O.:960; IV Piggyback:100] Out: 1600 [Urine:1600]   Intake/Output this shift:  Total I/O In: 120 [P.O.:120] Out: -   Physical Exam: General: NAD, sitting up in chair  Head: Normocephalic, atraumatic. Moist oral mucosal membranes  Eyes: Anicteric  Lungs:  Diminished  Heart: Regular rate and rhythm  Abdomen:  Soft, nontender  Extremities:  1+ peripheral edema  Neurologic: Alert, awake  Skin: No lesions  Access: Left AVF +thrill +bruit    Basic Metabolic Panel: Recent Labs  Lab 07/18/23 0342 07/19/23 0748 07/20/23 0447 07/21/23 0452 07/22/23 0453 07/23/23 0619  NA 142 144  143 143 141 140 139  K 3.2* 3.6  3.6 3.6 3.7 4.1 4.3  CL 109 104  103 105 102 102 99  CO2 23 27  26 28 27 27 27   GLUCOSE 110* 94  93 89 84 77 142*  BUN 39* 43*  42* 51* 59* 61* 58*  CREATININE 3.00* 3.26*  3.31* 3.69* 4.11* 3.95* 3.79*  CALCIUM 8.4* 9.1  9.1 8.8* 8.7* 8.5* 8.8*  MG  --  2.0 2.2 2.3 2.2 2.6*  PHOS 2.9  --  3.0  --    --  3.6    Liver Function Tests: Recent Labs  Lab 07/17/23 2042 07/18/23 0342 07/23/23 0619  AST 23  --   --   ALT 35  --   --   ALKPHOS 54  --   --   BILITOT 1.0  --   --   PROT 6.6  --   --   ALBUMIN 3.2* 2.9* 2.7*   No results for input(s): LIPASE, AMYLASE in the last 168 hours. No results for input(s): AMMONIA in the last 168 hours.  CBC: Recent Labs  Lab 07/19/23 0748 07/20/23 0447 07/21/23 0452 07/22/23 0453 07/23/23 0619  WBC 8.1 7.9 6.0 5.1 6.4  HGB 11.1* 10.0* 9.2* 9.1* 10.3*  HCT 36.0* 31.2* 29.7* 29.3* 32.4*  MCV 83.3 82.5 81.6 81.8 81.6  PLT 221 193 197 207 230    Cardiac Enzymes: No results for input(s): CKTOTAL, CKMB, CKMBINDEX, TROPONINI in the last 168 hours.  BNP: Invalid input(s): POCBNP  CBG: Recent Labs  Lab 07/22/23 1145 07/22/23 1553 07/22/23 2144 07/23/23 0719 07/23/23 1202  GLUCAP 90 117* 170* 137* 127*    Microbiology: Results for orders placed or performed during the hospital encounter of 07/17/23  Resp panel by RT-PCR (RSV, Flu A&B, Covid) Anterior Nasal Swab  Status: None   Collection Time: 07/17/23  8:42 PM   Specimen: Anterior Nasal Swab  Result Value Ref Range Status   SARS Coronavirus 2 by RT PCR NEGATIVE NEGATIVE Final    Comment: (NOTE) SARS-CoV-2 target nucleic acids are NOT DETECTED.  The SARS-CoV-2 RNA is generally detectable in upper respiratory specimens during the acute phase of infection. The lowest concentration of SARS-CoV-2 viral copies this assay can detect is 138 copies/mL. A negative result does not preclude SARS-Cov-2 infection and should not be used as the sole basis for treatment or other patient management decisions. A negative result may occur with  improper specimen collection/handling, submission of specimen other than nasopharyngeal swab, presence of viral mutation(s) within the areas targeted by this assay, and inadequate number of viral copies(<138 copies/mL). A negative  result must be combined with clinical observations, patient history, and epidemiological information. The expected result is Negative.  Fact Sheet for Patients:  BloggerCourse.com  Fact Sheet for Healthcare Providers:  SeriousBroker.it  This test is no t yet approved or cleared by the [place]  FDA and  has been authorized for detection and/or diagnosis of SARS-CoV-2 by FDA under an Emergency Use Authorization (EUA). This EUA will remain  in effect (meaning this test can be used) for the duration of the COVID-19 declaration under Section 564(b)(1) of the Act, 21 U.S.C.section 360bbb-3(b)(1), unless the authorization is terminated  or revoked sooner.       Influenza A by PCR NEGATIVE NEGATIVE Final   Influenza B by PCR NEGATIVE NEGATIVE Final    Comment: (NOTE) The Xpert Xpress SARS-CoV-2/FLU/RSV plus assay is intended as an aid in the diagnosis of influenza from Nasopharyngeal swab specimens and should not be used as a sole basis for treatment. Nasal washings and aspirates are unacceptable for Xpert Xpress SARS-CoV-2/FLU/RSV testing.  Fact Sheet for Patients: BloggerCourse.com  Fact Sheet for Healthcare Providers: SeriousBroker.it  This test is not yet approved or cleared by the [place]  FDA and has been authorized for detection and/or diagnosis of SARS-CoV-2 by FDA under an Emergency Use Authorization (EUA). This EUA will remain in effect (meaning this test can be used) for the duration of the COVID-19 declaration under Section 564(b)(1) of the Act, 21 U.S.C. section 360bbb-3(b)(1), unless the authorization is terminated or revoked.     Resp Syncytial Virus by PCR NEGATIVE NEGATIVE Final    Comment: (NOTE) Fact Sheet for Patients: BloggerCourse.com  Fact Sheet for Healthcare Providers: SeriousBroker.it  This  test is not yet approved or cleared by the [place]  FDA and has been authorized for detection and/or diagnosis of SARS-CoV-2 by FDA under an Emergency Use Authorization (EUA). This EUA will remain in effect (meaning this test can be used) for the duration of the COVID-19 declaration under Section 564(b)(1) of the Act, 21 U.S.C. section 360bbb-3(b)(1), unless the authorization is terminated or revoked.  Performed at [facility], [address] [place], [place] [address]   Blood culture (routine x 2)     Status: None (Preliminary result)   Collection Time: 07/17/23  8:42 PM   Specimen: BLOOD  Result Value Ref Range Status   Specimen Description BLOOD RIGHT FOREARM  Final   Special Requests   Final    BOTTLES DRAWN AEROBIC AND ANAEROBIC Blood Culture adequate volume   Culture   Final    NO GROWTH 4 DAYS Performed at [facility], [address], [place], [place] [address]    Report Status PENDING  Incomplete  Blood culture (  routine x 2)     Status: None (Preliminary result)   Collection Time: 07/17/23 10:33 PM   Specimen: BLOOD  Result Value Ref Range Status   Specimen Description BLOOD RIGHT ASSIST CONTROL  Final   Special Requests   Final    BOTTLES DRAWN AEROBIC AND ANAEROBIC Blood Culture results may not be optimal due to an inadequate volume of blood received in culture bottles   Culture   Final    NO GROWTH 4 DAYS Performed at [facility], [address] [place], [place] [address]    Report Status PENDING  Incomplete    Coagulation Studies: No results for input(s): LABPROT, INR in the last 72 hours.   Urinalysis: No results for input(s): COLORURINE, LABSPEC, PHURINE, GLUCOSEU, HGBUR, BILIRUBINUR, KETONESUR, PROTEINUR, UROBILINOGEN, NITRITE, LEUKOCYTESUR in the last 72 hours.  Invalid input(s): APPERANCEUR     Imaging: DG Foot Complete Left Result Date: 07/22/2023 CLINICAL DATA:  Left foot pain with  decreased range of motion. EXAM: LEFT FOOT - COMPLETE 3+ VIEW COMPARISON:  Radiographs 05/06/2012. FINDINGS: Artifact from associated no slip socks. The bones appear demineralized. No evidence of acute fracture or dislocation. Probable posttraumatic changes at the 4th proximal interphalangeal joint. Scattered mild degenerative changes with small bidirectional calcaneal spurs. No definite soft tissue abnormality identified. IMPRESSION: No acute osseous findings. Scattered mild degenerative changes. Electronically Signed   By: Elsie Perone M.D.   On: 07/22/2023 10:22      Medications:      ALPRAZolam   0.5 mg Oral BID   amiodarone   400 mg Oral BID   Followed by   NOREEN ON 07/31/2023] amiodarone   200 mg Oral Daily   apixaban   2.5 mg Oral BID   cholecalciferol   5,000 Units Oral Daily   clopidogrel   75 mg Oral Daily   diclofenac  Sodium  4 g Topical QID   donepezil   10 mg Oral QHS   enalapril   20 mg Oral BID   finasteride   5 mg Oral Daily   glipiZIDE   5 mg Oral Q breakfast   insulin  aspart  0-6 Units Subcutaneous TID WC   metoprolol  succinate  12.5 mg Oral Daily   sodium bicarbonate   650 mg Oral BID   tamsulosin   0.4 mg Oral Daily   acetaminophen  **OR** acetaminophen , ondansetron  **OR** ondansetron  (ZOFRAN ) IV, polyethylene glycol, senna-docusate  Assessment/ Plan:  Mr. RAEFORD BRANDENBURG is a 83 y.o.  male with chronic kidney disease stage IV, hypertension, diabetes mellitus type II, BPH, peripheral arterial disease, and diastolic congestive heart failure who presents on 07/17/2023 for Acute pulmonary edema (HCC) [J81.0] SOB (shortness of breath) [R06.02] Elevated troponin [R79.89]  Chronic Kidney Disease stage IV with proteinuria: baseline creatinine 3.44, GFR of 17.  - Creatinine improved.  - Can restart home regimen.  - continue enalapril  and sodium bicarb  Acute exacerbation of chronic diastolic congestive heart failure -Furosemide  and spironolactone  held due to kidney injury. Will  restart home regimen - Continue to hold for today.  May consider restarting tomorrow   Hypertension with chronic kidney disease -Holding furosemide  with Spironolactone  - continue enalapril , hydralazine , metoprolol , tamsulosin , and amlodipine  - Blood pressure 131/51  Diabetes mellitus type II with chronic kidney disease: hemoglobin A1c of 5.2% - Primary team to manage SSI  Hypokalemia: with history of hyperkalemia - Oral potassium chloride  supplementation as needed  6.  Acute gout flare, suspected.  Diuretic therapy can induce gout.  Will order oral prednisone  40 mg for discomfort.    States discomfort  has improved.    LOS: 6 Saketh Daubert 7/11/20251:46 PM

## 2023-07-23 NOTE — TOC Progression Note (Addendum)
 Transition of Care [facility]) - Progression Note    Patient Details  Name: Jeffery Joseph MRN: [id number] Date of Birth: December 06, 1940  Transition of Care [facility]) CM/SW Contact  Tomasa JAYSON Childes, RN Phone Number: 07/23/2023, 12:20 PM  Clinical Narrative:    Per processing team at [employer] [facility] order must read tall vs bari. MD updated, order adjusted.   Per Thom at [employer] order processing. Waiting or delivery processing.   3:15pm Per Mitch at [employer] DME will be delivered today however time can not be provided.   4:08pm Spoke with patient's daughter to advised DME would be delivered. She stated she had been contacted by [employer] and told the [facility] would be delivered to patient's room. She was not provided with a delivery time for the bed other than likely tomorrow.      Expected Discharge Plan: Home/Self Care Barriers to Discharge: Continued Medical Work up  Expected Discharge Plan and Services     Post Acute Care Choice: NA Living arrangements for the past 2 months: Single Family Home                                       Social Determinants of Health (SDOH) Interventions SDOH Screenings   Food Insecurity: No Food Insecurity (07/18/2023)  Housing: Low Risk  (07/18/2023)  Transportation Needs: No Transportation Needs (07/18/2023)  Utilities: Not At Risk (07/18/2023)  Financial Resource Strain: Low Risk  (11/25/2022)   Received from [facility]  Social Connections: Socially Integrated (07/18/2023)  Tobacco Use: Low Risk  (07/18/2023)    Readmission Risk Interventions    07/19/2023   11:05 AM  Readmission Risk Prevention Plan  Transportation Screening Complete  PCP or Specialist Appt within 3-5 Days Complete  Social Work Consult for Recovery Care Planning/Counseling Complete  Palliative Care Screening Not Applicable  Medication Review Oceanographer) Complete

## 2023-07-23 NOTE — Consult Note (Signed)
 PHARMACY CONSULT NOTE - FOLLOW UP  Pharmacy Consult for Electrolyte Monitoring and Replacement   Recent Labs: Potassium (mmol/L)  Date Value  07/23/2023 4.3  05/06/2012 4.1   Magnesium (mg/dL)  Date Value  92/88/7974 2.6 (H)   Calcium (mg/dL)  Date Value  92/88/7974 8.8 (L)   Calcium, Total (mg/dL)  Date Value  95/74/7985 9.5   Albumin (g/dL)  Date Value  92/88/7974 2.7 (L)   Phosphorus (mg/dL)  Date Value  92/88/7974 3.6   Sodium (mmol/L)  Date Value  07/23/2023 139  05/06/2012 139     Assessment: 83 y.o. 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 Pharmacy is asked to follow and replace electrolytes. Scr trending up.   On Amio 200 mg BID, on enalapril  20 mg BID, on lasix  40 mg q8H, on spironolactone  12.5 mg daily,   Goal of Therapy:  K>4, Mag>2  Plan:  No replacement needed F/u with AM labs on 7/13.    Cathaleen GORMAN Blanch ,PharmD Clinical Pharmacist 07/23/2023 8:07 AM

## 2023-07-23 NOTE — Progress Notes (Addendum)
 Mobility Specialist - Progress Note   07/23/23 1202  Mobility  Activity Ambulated with assistance in hallway  Level of Assistance Contact guard assist, steadying assist  Assistive Device Front wheel walker  Distance Ambulated (ft) 115 ft  Activity Response Tolerated well  Mobility visit 1 Mobility     Pt sitting in chair upon arrival, utilizing RA. Pt agreeable to activity. STS with minA and ambulation in hallway with CGA. VC to slow pace and stay close to RW. No LOB. Chair follow for safety. Increased unsteadiness at times. Pt returned to chair and wheeled back to room with alarm set, needs in reach.    Lennette Seip Mobility Specialist 07/23/23, 12:05 PM

## 2023-07-23 NOTE — Plan of Care (Signed)

## 2023-07-24 DIAGNOSIS — J810 Acute pulmonary edema: Secondary | ICD-10-CM | POA: Diagnosis not present

## 2023-07-24 LAB — BASIC METABOLIC PANEL WITH GFR
Anion gap: 10 (ref 5–15)
BUN: 60 mg/dL — ABNORMAL HIGH (ref 8–23)
CO2: 27 mmol/L (ref 22–32)
Calcium: 8.5 mg/dL — ABNORMAL LOW (ref 8.9–10.3)
Chloride: 100 mmol/L (ref 98–111)
Creatinine, Ser: 3.6 mg/dL — ABNORMAL HIGH (ref 0.61–1.24)
GFR, Estimated: 16 mL/min — ABNORMAL LOW (ref >60)
Glucose, Bld: 163 mg/dL — ABNORMAL HIGH (ref 70–99)
Potassium: 4.6 mmol/L (ref 3.5–5.1)
Sodium: 137 mmol/L (ref 135–145)

## 2023-07-24 LAB — GLUCOSE, CAPILLARY: Glucose-Capillary: 114 mg/dL — ABNORMAL HIGH (ref 70–99)

## 2023-07-24 MED ORDER — FUROSEMIDE 40 MG PO TABS: 40.0000 mg | ORAL_TABLET | ORAL | 0 refills | Status: DC

## 2023-07-24 MED ORDER — APIXABAN 2.5 MG PO TABS: 2.5000 mg | ORAL_TABLET | Freq: Two times a day (BID) | ORAL | 0 refills | Status: AC

## 2023-07-24 MED ORDER — METOPROLOL SUCCINATE ER 25 MG PO TB24: 12.5000 mg | ORAL_TABLET | Freq: Every day | ORAL | 0 refills | Status: AC

## 2023-07-24 MED ORDER — AMIODARONE HCL 200 MG PO TABS: ORAL_TABLET | ORAL | 0 refills | Status: AC

## 2023-07-24 MED ORDER — DICLOFENAC SODIUM 1 % EX GEL: 2.0000 g | Freq: Four times a day (QID) | CUTANEOUS | 0 refills | Status: DC

## 2023-07-24 NOTE — TOC Transition Note (Signed)
 Transition of Care [facility]) - Discharge Note   Patient Details  Name: Jeffery Joseph MRN: [id number] Date of Birth: 01/26/1940  [facility] CM/SW Contact:  [place] L Porcia Morganti, LCSW Phone Number: 07/24/2023, 10:25 AM   Clinical Narrative:         Barriers to Discharge: Continued Medical Work up   Patient Goals and CMS Choice            Discharge Placement    DME delivered to patient room. POA expressed concern that patient received a regular BSC and not a bariatric commode. CSW contacted [employer] and confirmed that patient did not meet the qualifications for a bari bsc as the weight limit to qualify is 350lbs. POA was advised of this.   POA advised that [facility] reached out to her to set up [facility]. CSW confirmed with [facility] that patient is set up for [facility]. CSW spoke with [place]. No further TOC needs identified.   TOC signing off.               Discharge Plan and Services Additional resources added to the After Visit Summary for       Post Acute Care Choice: NA                               Social Drivers of Health (SDOH) Interventions SDOH Screenings   Food Insecurity: No Food Insecurity (07/18/2023)  Housing: Low Risk  (07/18/2023)  Transportation Needs: No Transportation Needs (07/18/2023)  Utilities: Not At Risk (07/18/2023)  Financial Resource Strain: Low Risk  (11/25/2022)   Received from [facility]  Social Connections: Socially Integrated (07/18/2023)  Tobacco Use: Low Risk  (07/18/2023)     Readmission Risk Interventions    07/19/2023   11:05 AM  Readmission Risk Prevention Plan  Transportation Screening Complete  PCP or Specialist Appt within 3-5 Days Complete  Social Work Consult for Recovery Care Planning/Counseling Complete  Palliative Care Screening Not Applicable  Medication Review Oceanographer) Complete

## 2023-07-24 NOTE — Discharge Summary (Signed)
 Physician Discharge Summary  Jeffery Joseph FMW:[id number] DOB: [birth year] DOA: 07/17/2023  PCP: Epifanio Alm SQUIBB, MD  Admit date: 07/17/2023 Discharge date: 07/24/2023  Admitted From: Home Disposition:  Home w home health  Recommendations for Outpatient Follow-up:  Follow up with PCP in 1-2 weeks Follow-up nephrology 2 weeks  Home Health: Yes PT OT RN aide Equipment/Devices: RW walker, bedside commode, hospital bed  Discharge Condition: Stable CODE STATUS: DNR limited Diet recommendation: Renal  Brief/Interim Summary:  83 y.o. 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, T2DM, HTN, PAD, arthritis, anxiety and depression, anemia of chronic disease, chronic hyperkalemia and BPH who presented to the ED for evaluation of shortness of breath and admitted for acute pulmonary edema    7/6: Cardiology and nephrology consult 7/7: transfer to PCU, amio started 7/8: PT, OT eval DME ordered for home, stopping hydralazine  as blood pressure running low, Eliquis  started    Discharge Diagnoses:  Principal Problem:   Acute pulmonary edema (HCC) Active Problems:   Elevated troponin   Hypertensive emergency   SOB (shortness of breath)   Hypokalemia  # Acute pulmonary edema in the setting of hypertensive emergency # Acute on chronic HFpEF  - Patient with 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  Lasix  on hold on 7/9 due to rising creatinine - Back to baseline volume status Plan: Discharge home.  Restart home Lasix  dosing.  40 mg q. OD.  Hold Aldactone  until outpatient evaluation by nephrology.  New onset A.flutter/RVR HR in 120-130s, asymptomatic Was on amiodarone  gtt.  This was weaned off.  Restarted on 7/8.  Heart rate improved. Plan: P.o. amiodarone  taper.  Follow-up  outpatient EP clinic   Discharge Instructions  Discharge Instructions     Diet - low sodium heart healthy   Complete by: As directed    Increase activity slowly   Complete by: As directed       Allergies as of 07/24/2023   No Known Allergies      Medication List     STOP taking these medications    amLODipine  10 MG tablet Commonly known as: NORVASC    aspirin  EC 81 MG tablet   hydrALAZINE  50 MG tablet Commonly known as: APRESOLINE    metoprolol  tartrate 25 MG tablet Commonly known as: LOPRESSOR    Patiromer Sorbitex Calcium 16.8 g Pack       TAKE these medications    acetaminophen  325 MG tablet Commonly known as: TYLENOL  Take 650 mg by mouth every 6 (six) hours as needed.   ALPRAZolam  0.5 MG tablet Commonly known as: XANAX  Take 0.5 mg by mouth 3 (three) times daily as needed for sleep.   amiodarone  200 MG tablet Commonly known as: PACERONE  Take 2 tablets (400 mg total) by mouth 2 (two) times daily for 7 days, THEN 1 tablet (200 mg total) daily. Start taking on: July 24, 2023   apixaban  2.5 MG Tabs tablet Commonly known as: ELIQUIS  Take 1 tablet (2.5 mg total) by mouth 2 (two) times daily.   Cholecalciferol  125 MCG (5000 UT) Tabs Take 5,000 Units by mouth daily.   clopidogrel  75 MG tablet Commonly known as: Plavix  Take 1 tablet (75 mg total) by mouth daily.   diclofenac  Sodium 1 % Gel Commonly known as: VOLTAREN  Apply 2 g topically 4 (  four) times daily.   donepezil  10 MG tablet Commonly known as: ARICEPT  Take 10 mg by mouth at bedtime.   DSS 100 MG Caps Take 100 mg by mouth at bedtime.   enalapril  20 MG tablet Commonly known as: VASOTEC  Take 20 mg by mouth 2 (two) times daily.   finasteride  5 MG tablet Commonly known as: PROSCAR  Take 1 tablet (5 mg total) by mouth daily.   furosemide  40 MG tablet Commonly known as: LASIX  Take 1 tablet (40 mg total) by mouth every other day. Start taking on: July 25, 2023   glipiZIDE  5 MG 24 hr  tablet Commonly known as: GLUCOTROL  XL Take by mouth.   Lokelma 10 g Pack packet Generic drug: sodium zirconium cyclosilicate Take 1 packet by mouth daily.   metoprolol  succinate 25 MG 24 hr tablet Commonly known as: TOPROL -XL Take 0.5 tablets (12.5 mg total) by mouth daily.   sodium bicarbonate  650 MG tablet Take 650 mg by mouth 2 (two) times daily.   tamsulosin  0.4 MG Caps capsule Commonly known as: FLOMAX  Take 1 capsule (0.4 mg total) by mouth daily.               Durable Medical Equipment  (From admission, onward)           Start     Ordered   07/23/23 0850  For home use only DME Hospital bed  Once       Comments: With bed extension  Question Answer Comment  Length of Need Lifetime   The above medical condition requires: Patient requires the ability to reposition frequently   Bed type Semi-electric      07/23/23 0850   07/23/23 0850  For home use only DME Bedside commode  Once       Comments: Tall  Question:  Patient needs a bedside commode to treat with the following condition  Answer:  Ambulatory dysfunction   07/23/23 0850   07/23/23 0850  For home use only DME Walker  Once       Comments: Tall  Question:  Patient needs a walker to treat with the following condition  Answer:  Ambulatory dysfunction   07/23/23 0850            Follow-up Information     Florencio Cara BIRCH, MD. Go in 2 week(s).   Specialties: Cardiology, Internal Medicine Contact information: [address] [place] [address] [phone]         Hudson, Caralyn, PA-C. Go in 1 week(s).   Specialty: Cardiology Why: Heart Failure Contact information: [address] [place] [address] [phone]         Dr. Theron Follow up.   Why: [address]  Appt. on 07/23 at 11 AM Contact information: [facility] Cardiology, EP Specialist               No Known Allergies  Consultations: Nephrology Cardiology   Procedures/Studies: DG  Foot Complete Left Result Date: 07/22/2023 CLINICAL DATA:  Left foot pain with decreased range of motion. EXAM: LEFT FOOT - COMPLETE 3+ VIEW COMPARISON:  Radiographs 05/06/2012. FINDINGS: Artifact from associated no slip socks. The bones appear demineralized. No evidence of acute fracture or dislocation. Probable posttraumatic changes at the 4th proximal interphalangeal joint. Scattered mild degenerative changes with small bidirectional calcaneal spurs. No definite soft tissue abnormality identified. IMPRESSION: No acute osseous findings. Scattered mild degenerative changes. Electronically Signed   By: Elsie Perone M.D.   On: 07/22/2023 10:22  DG Chest 2 View Result Date: 07/19/2023 CLINICAL DATA:  [id number] Dyspnea [id number] EXAM: CHEST - 2 VIEW COMPARISON:  July 18, 2023 FINDINGS: Lower lung volumes. Bilateral perihilar interstitial opacities throughout both lungs. Hazy airspace opacities within the lung bases with blunting of the costophrenic sulci. No pneumothorax. Mild cardiomegaly. Tortuous aorta with aortic atherosclerosis. No acute fracture or destructive lesions. Multilevel thoracic osteophytosis. IMPRESSION: Lower lung volumes. Redemonstrated cardiomegaly with findings of pulmonary edema. Increasing hazy airspace opacities within the lung bases, possibly due to pleural effusions and atelectasis or worsening edema. Electronically Signed   By: Rogelia Myers M.D.   On: 07/19/2023 08:22   DG Chest Port 1 View Result Date: 07/18/2023 CLINICAL DATA:  Dyspnea EXAM: PORTABLE CHEST 1 VIEW COMPARISON:  07/17/2023 FINDINGS: Cardiomegaly with vascular congestion and mild diffuse interstitial and ground-glass opacity, improved compared with 07/17/2023. Small bilateral effusions. No pneumothorax. Stent in the left axillary region IMPRESSION: Cardiomegaly with vascular congestion and edema, improved compared with 07/17/2023. Small bilateral effusions. Electronically Signed   By: Luke Bun M.D.   On: 07/18/2023  20:14   ECHOCARDIOGRAM COMPLETE Result Date: 07/18/2023    ECHOCARDIOGRAM REPORT   Patient Name:   Jeffery Joseph Date of Exam: 07/18/2023 Medical Rec #:  [id number]     Height:       77.0 in Accession #:    [id number]    Weight:       261.0 lb Date of Birth:  11/30/1940     BSA:          2.505 m Patient Age:    82 years      BP:           157/58 mmHg Patient Gender: M             HR:           63 bpm. Exam Location:  [facility] Procedure: 2D Echo, Cardiac Doppler and Color Doppler (Both Spectral and Color            Flow Doppler were utilized during procedure). Indications:     Dyspnea R06.00  History:         Patient has no prior history of Echocardiogram examinations.  Sonographer:     Thedora Louder RDCS, FASE Referring Phys:  [id number] CLARETTA HERO AMPONSAH Diagnosing Phys: Cara JONETTA Lovelace MD  Sonographer Comments: This study was performed with the patient positioned supine. IMPRESSIONS  1. Left ventricular ejection fraction, by estimation, is 55 to 60%. The left ventricle has normal function. The left ventricle has no regional wall motion abnormalities. The left ventricular internal cavity size was mildly dilated. There is moderate concentric left ventricular hypertrophy. Left ventricular diastolic parameters are consistent with Grade III diastolic dysfunction (restrictive).  2. Right ventricular systolic function is normal. The right ventricular size is normal.  3. Left atrial size was moderately dilated.  4. Right atrial size was mild to moderately dilated.  5. The mitral valve is grossly normal. Trivial mitral valve regurgitation.  6. Tricuspid valve regurgitation is mild to moderate.  7. The aortic valve is grossly normal. Aortic valve regurgitation is mild. Mild aortic valve stenosis. FINDINGS  Left Ventricle: Left ventricular ejection fraction, by estimation, is 55 to 60%. The left ventricle has normal function. The left ventricle has no regional wall motion abnormalities. Strain was performed and the global  longitudinal strain is indeterminate. Global longitudinal strain performed but not reported based on interpreter judgement due to suboptimal tracking. The left ventricular internal cavity  size was mildly dilated. There is moderate concentric left ventricular hypertrophy. Left ventricular diastolic parameters are consistent with Grade III diastolic dysfunction (restrictive). Right Ventricle: The right ventricular size is normal. No increase in right ventricular wall thickness. Right ventricular systolic function is normal. Left Atrium: Left atrial size was moderately dilated. Right Atrium: Right atrial size was mild to moderately dilated. Pericardium: There is no evidence of pericardial effusion. Mitral Valve: The mitral valve is grossly normal. Trivial mitral valve regurgitation. Tricuspid Valve: The tricuspid valve is normal in structure. Tricuspid valve regurgitation is mild to moderate. Aortic Valve: The aortic valve is grossly normal. Aortic valve regurgitation is mild. Aortic regurgitation PHT measures 505 msec. Mild aortic stenosis is present. Aortic valve mean gradient measures 20.8 mmHg. Aortic valve peak gradient measures 41.7 mmHg. Aortic valve area, by VTI measures 1.61 cm. Pulmonic Valve: The pulmonic valve was normal in structure. Pulmonic valve regurgitation is not visualized. Aorta: The ascending aorta was not well visualized. IAS/Shunts: No atrial level shunt detected by color flow Doppler. Additional Comments: 3D was performed not requiring image post processing on an independent workstation and was indeterminate.  LEFT VENTRICLE PLAX 2D LVIDd:         5.50 cm   Diastology LVIDs:         3.90 cm   LV e' medial:    8.16 cm/s LV PW:         1.50 cm   LV E/e' medial:  19.9 LV IVS:        1.60 cm   LV e' lateral:   12.50 cm/s LVOT diam:     2.10 cm   LV E/e' lateral: 13.0 LV SV:         116 LV SV Index:   46 LVOT Area:     3.46 cm  RIGHT VENTRICLE RV Basal diam:  3.60 cm RV S prime:     14.30 cm/s  TAPSE (M-mode): 2.6 cm LEFT ATRIUM             Index        RIGHT ATRIUM           Index LA diam:        5.00 cm 2.00 cm/m   RA Area:     25.20 cm LA Vol (A2C):   93.0 ml 37.13 ml/m  RA Volume:   79.90 ml  31.90 ml/m LA Vol (A4C):   89.0 ml 35.53 ml/m LA Biplane Vol: 94.0 ml 37.53 ml/m  AORTIC VALVE                     PULMONIC VALVE AV Area (Vmax):    1.58 cm      PV Vmax:          1.33 m/s AV Area (Vmean):   1.57 cm      PV Peak grad:     7.1 mmHg AV Area (VTI):     1.61 cm      PR End Diast Vel: 11.42 msec AV Vmax:           323.00 cm/s   RVOT Peak grad:   7 mmHg AV Vmean:          211.000 cm/s AV VTI:            0.725 m AV Peak Grad:      41.7 mmHg AV Mean Grad:      20.8 mmHg LVOT Vmax:  147.00 cm/s LVOT Vmean:        95.400 cm/s LVOT VTI:          0.336 m LVOT/AV VTI ratio: 0.46 AI PHT:            505 msec  AORTA Ao Root diam: 3.30 cm Ao Asc diam:  2.60 cm MITRAL VALVE                TRICUSPID VALVE MV Area (PHT): 3.42 cm     TR Peak grad:   59.0 mmHg MV Decel Time: 222 msec     TR Vmax:        384.00 cm/s MR Peak grad: 168.5 mmHg MR Mean grad: 117.0 mmHg    SHUNTS MR Vmax:      649.00 cm/s   Systemic VTI:  0.34 m MR Vmean:     513.0 cm/s    Systemic Diam: 2.10 cm MV E velocity: 162.00 cm/s MV A velocity: 73.90 cm/s MV E/A ratio:  2.19 Dwayne D Callwood MD Electronically signed by Cara JONETTA Lovelace MD Signature Date/Time: 07/18/2023/8:39:21 AM    Final    DG Chest Portable 1 View Result Date: 07/17/2023 CLINICAL DATA:  sob EXAM: PORTABLE CHEST 1 VIEW COMPARISON:  None Available. FINDINGS: Cardiac enlargement with some portion likely due to AP portable technique. Otherwise the heart and mediastinal contours are within normal limits. No focal consolidation. Pulmonary edema. No pleural effusion. No pneumothorax. No acute osseous abnormality. IMPRESSION: 1. Pulmonary edema. 2. Cardiac enlargement with some portion likely due to AP portable technique. 3. Consider repeat chest x-ray PA and lateral  view for further evaluation. Electronically Signed   By: Morgane  Naveau M.D.   On: 07/17/2023 21:07   VAS US  DUPLEX DIALYSIS ACCESS (AVF, AVG) Result Date: 07/14/2023 DIALYSIS ACCESS Patient Name:  JYMIR DUNAJ  Date of Exam:   07/12/2023 Medical Rec #: [id number]      Accession #:    [id number] Date of Birth: 1940-04-10      Patient Gender: M Patient Age:   57 years Exam Location:  [facility] Procedure:      VAS US  DUPLEX DIALYSIS ACCESS (AVF, AVG) Referring Phys: CORDELLA SHAWL --------------------------------------------------------------------------------  Reason for Exam: Routine follow up. Access Site: Left Upper Extremity. Access Type: Brachial-cephalic AVF. History: Created 11/2021          Stent placed at cephalic / subclavian confluence 06/08/2023. Performing Technologist: Donnice Charnley RVT  Examination Guidelines: A complete evaluation includes B-mode imaging, spectral Doppler, color Doppler, and power Doppler as needed of all accessible portions of each vessel. Unilateral testing is considered an integral part of a complete examination. Limited examinations for reoccurring indications may be performed as noted.  Findings: +--------------------+----------+-----------------+--------+ AVF                 PSV (cm/s)Flow Vol (mL/min)Comments +--------------------+----------+-----------------+--------+ Native artery inflow   234          2520                +--------------------+----------+-----------------+--------+ AVF Anastomosis        609                              +--------------------+----------+-----------------+--------+  +---------------+----------+-------------+----------+--------+ OUTFLOW VEIN   PSV (cm/s)Diameter (cm)Depth (cm)Describe +---------------+----------+-------------+----------+--------+ Subclavian vein    85                                    +---------------+----------+-------------+----------+--------+  Confluence        282        0.73                         +---------------+----------+-------------+----------+--------+ Clavicle          214        0.78                        +---------------+----------+-------------+----------+--------+ Shoulder          148        0.74                        +---------------+----------+-------------+----------+--------+ Prox UA           172        0.60                        +---------------+----------+-------------+----------+--------+ Mid UA            172        0.67                        +---------------+----------+-------------+----------+--------+ Dist UA           167        0.84                        +---------------+----------+-------------+----------+--------+ AC Fossa          706        0.90                        +---------------+----------+-------------+----------+--------+   Summary: Patent arteriovenous fistula. *See table(s) above for measurements and observations.  Diagnosing physician: Cordella Shawl MD Electronically signed by Cordella Shawl MD on 07/14/2023 at 3:14:43 PM.   --------------------------------------------------------------------------------   Final       Subjective: Seen and examined on the day of discharge.  Stable no distress.  Appropriate for discharge.  Discharge Exam: Vitals:   07/24/23 0744 07/24/23 1028  BP: (!) 127/54   Pulse: (!) 56 (!) 53  Resp: 16   Temp: 98.1 F (36.7 C)   SpO2: 98%    Vitals:   07/24/23 0339 07/24/23 0500 07/24/23 0744 07/24/23 1028  BP: (!) 130/52  (!) 127/54   Pulse: (!) 55  (!) 56 (!) 53  Resp: 19  16   Temp: 97.6 F (36.4 C)  98.1 F (36.7 C)   TempSrc:      SpO2: 100%  98%   Weight:  124.2 kg    Height:        General: Pt is alert, awake, not in acute distress Cardiovascular: RRR, S1/S2 +, no rubs, no gallops Respiratory: CTA bilaterally, no wheezing, no rhonchi Abdominal: Soft, NT, ND, bowel sounds + Extremities: no edema, no cyanosis    The results of significant  diagnostics from this hospitalization (including imaging, microbiology, ancillary and laboratory) are listed below for reference.     Microbiology: Recent Results (from the past 240 hours)  Resp panel by RT-PCR (RSV, Flu A&B, Covid) Anterior Nasal Swab     Status: None   Collection Time: 07/17/23  8:42 PM   Specimen: Anterior Nasal Swab  Result Value Ref Range Status   SARS Coronavirus 2 by RT PCR NEGATIVE NEGATIVE Final  Comment: (NOTE) SARS-CoV-2 target nucleic acids are NOT DETECTED.  The SARS-CoV-2 RNA is generally detectable in upper respiratory specimens during the acute phase of infection. The lowest concentration of SARS-CoV-2 viral copies this assay can detect is 138 copies/mL. A negative result does not preclude SARS-Cov-2 infection and should not be used as the sole basis for treatment or other patient management decisions. A negative result may occur with  improper specimen collection/handling, submission of specimen other than nasopharyngeal swab, presence of viral mutation(s) within the areas targeted by this assay, and inadequate number of viral copies(<138 copies/mL). A negative result must be combined with clinical observations, patient history, and epidemiological information. The expected result is Negative.  Fact Sheet for Patients:  BloggerCourse.com  Fact Sheet for Healthcare Providers:  SeriousBroker.it  This test is no t yet approved or cleared by the [place]  FDA and  has been authorized for detection and/or diagnosis of SARS-CoV-2 by FDA under an Emergency Use Authorization (EUA). This EUA will remain  in effect (meaning this test can be used) for the duration of the COVID-19 declaration under Section 564(b)(1) of the Act, 21 U.S.C.section 360bbb-3(b)(1), unless the authorization is terminated  or revoked sooner.       Influenza A by PCR NEGATIVE NEGATIVE Final   Influenza B by PCR NEGATIVE  NEGATIVE Final    Comment: (NOTE) The Xpert Xpress SARS-CoV-2/FLU/RSV plus assay is intended as an aid in the diagnosis of influenza from Nasopharyngeal swab specimens and should not be used as a sole basis for treatment. Nasal washings and aspirates are unacceptable for Xpert Xpress SARS-CoV-2/FLU/RSV testing.  Fact Sheet for Patients: BloggerCourse.com  Fact Sheet for Healthcare Providers: SeriousBroker.it  This test is not yet approved or cleared by the [place]  FDA and has been authorized for detection and/or diagnosis of SARS-CoV-2 by FDA under an Emergency Use Authorization (EUA). This EUA will remain in effect (meaning this test can be used) for the duration of the COVID-19 declaration under Section 564(b)(1) of the Act, 21 U.S.C. section 360bbb-3(b)(1), unless the authorization is terminated or revoked.     Resp Syncytial Virus by PCR NEGATIVE NEGATIVE Final    Comment: (NOTE) Fact Sheet for Patients: BloggerCourse.com  Fact Sheet for Healthcare Providers: SeriousBroker.it  This test is not yet approved or cleared by the [place]  FDA and has been authorized for detection and/or diagnosis of SARS-CoV-2 by FDA under an Emergency Use Authorization (EUA). This EUA will remain in effect (meaning this test can be used) for the duration of the COVID-19 declaration under Section 564(b)(1) of the Act, 21 U.S.C. section 360bbb-3(b)(1), unless the authorization is terminated or revoked.  Performed at [facility], [address] [place], [place] [address]   Blood culture (routine x 2)     Status: None   Collection Time: 07/17/23  8:42 PM   Specimen: BLOOD  Result Value Ref Range Status   Specimen Description   Final    BLOOD RIGHT FOREARM Performed at [facility], [address] [place], [place] [address]    Special Requests   Final     BOTTLES DRAWN AEROBIC AND ANAEROBIC Blood Culture adequate volume Performed at [facility], [address], [place], [place] [address]    Culture   Final    NO GROWTH 6 DAYS Performed at [facility], [address] [place], [place] [address]    Report Status 07/23/2023 FINAL  Final  Blood culture (routine x 2)  Status: None   Collection Time: 07/17/23 10:33 PM   Specimen: BLOOD  Result Value Ref Range Status   Specimen Description   Final    BLOOD RIGHT ASSIST CONTROL Performed at [facility], [address] [place], [place] [address]    Special Requests   Final    BOTTLES DRAWN AEROBIC AND ANAEROBIC Blood Culture results may not be optimal due to an inadequate volume of blood received in culture bottles Performed at [facility], [address], [place], [place] [address]    Culture   Final    NO GROWTH 6 DAYS Performed at [facility], [address], [place], [place] [address]    Report Status 07/23/2023 FINAL  Final     Labs: BNP (last 3 results) Recent Labs    07/17/23 2042 07/19/23 0748  BNP 2,410.3* 2,062.1*   Basic Metabolic Panel: Recent Labs  Lab 07/18/23 0342 07/19/23 0748 07/20/23 0447 07/21/23 0452 07/22/23 0453 07/23/23 0619 07/24/23 0932  NA 142 144  143 143 141 140 139 137  K 3.2* 3.6  3.6 3.6 3.7 4.1 4.3 4.6  CL 109 104  103 105 102 102 99 100  CO2 23 27  26 28 27 27 27 27   GLUCOSE 110* 94  93 89 84 77 142* 163*  BUN 39* 43*  42* 51* 59* 61* 58* 60*  CREATININE 3.00* 3.26*  3.31* 3.69* 4.11* 3.95* 3.79* 3.60*  CALCIUM 8.4* 9.1  9.1 8.8* 8.7* 8.5* 8.8* 8.5*  MG  --  2.0 2.2 2.3 2.2 2.6*  --   PHOS 2.9  --  3.0  --   --  3.6  --    Liver Function Tests: Recent Labs  Lab 07/17/23 2042 07/18/23 0342 07/23/23 0619  AST 23  --   --   ALT 35  --   --   ALKPHOS 54  --   --   BILITOT 1.0  --   --   PROT 6.6  --   --   ALBUMIN 3.2* 2.9* 2.7*   No results for input(s): LIPASE,  AMYLASE in the last 168 hours. No results for input(s): AMMONIA in the last 168 hours. CBC: Recent Labs  Lab 07/19/23 0748 07/20/23 0447 07/21/23 0452 07/22/23 0453 07/23/23 0619  WBC 8.1 7.9 6.0 5.1 6.4  HGB 11.1* 10.0* 9.2* 9.1* 10.3*  HCT 36.0* 31.2* 29.7* 29.3* 32.4*  MCV 83.3 82.5 81.6 81.8 81.6  PLT 221 193 197 207 230   Cardiac Enzymes: No results for input(s): CKTOTAL, CKMB, CKMBINDEX, TROPONINI in the last 168 hours. BNP: Invalid input(s): POCBNP CBG: Recent Labs  Lab 07/23/23 0719 07/23/23 1202 07/23/23 1600 07/23/23 2107 07/24/23 0744  GLUCAP 137* 127* 107* 169* 114*   D-Dimer No results for input(s): DDIMER in the last 72 hours. Hgb A1c No results for input(s): HGBA1C in the last 72 hours. Lipid Profile No results for input(s): CHOL, HDL, LDLCALC, TRIG, CHOLHDL, LDLDIRECT in the last 72 hours. Thyroid function studies No results for input(s): TSH, T4TOTAL, T3FREE, THYROIDAB in the last 72 hours.  Invalid input(s): FREET3 Anemia work up No results for input(s): VITAMINB12, FOLATE, FERRITIN, TIBC, IRON, RETICCTPCT in the last 72 hours. Urinalysis    Component Value Date/Time   COLORURINE YELLOW (A) 07/17/2023 2012   APPEARANCEUR CLEAR (A) 07/17/2023 2012   APPEARANCEUR Clear 08/11/2019 1356   LABSPEC 1.012 07/17/2023 2012   PHURINE 6.0 07/17/2023 2012   GLUCOSEU NEGATIVE 07/17/2023 2012   HGBUR  SMALL (A) 07/17/2023 2012   BILIRUBINUR NEGATIVE 07/17/2023 2012   BILIRUBINUR Negative 08/11/2019 1356   KETONESUR NEGATIVE 07/17/2023 2012   PROTEINUR 100 (A) 07/17/2023 2012   NITRITE NEGATIVE 07/17/2023 2012   LEUKOCYTESUR NEGATIVE 07/17/2023 2012   Sepsis Labs Recent Labs  Lab 07/20/23 0447 07/21/23 0452 07/22/23 0453 07/23/23 0619  WBC 7.9 6.0 5.1 6.4   Microbiology Recent Results (from the past 240 hours)  Resp panel by RT-PCR (RSV, Flu A&B, Covid) Anterior Nasal Swab     Status: None    Collection Time: 07/17/23  8:42 PM   Specimen: Anterior Nasal Swab  Result Value Ref Range Status   SARS Coronavirus 2 by RT PCR NEGATIVE NEGATIVE Final    Comment: (NOTE) SARS-CoV-2 target nucleic acids are NOT DETECTED.  The SARS-CoV-2 RNA is generally detectable in upper respiratory specimens during the acute phase of infection. The lowest concentration of SARS-CoV-2 viral copies this assay can detect is 138 copies/mL. A negative result does not preclude SARS-Cov-2 infection and should not be used as the sole basis for treatment or other patient management decisions. A negative result may occur with  improper specimen collection/handling, submission of specimen other than nasopharyngeal swab, presence of viral mutation(s) within the areas targeted by this assay, and inadequate number of viral copies(<138 copies/mL). A negative result must be combined with clinical observations, patient history, and epidemiological information. The expected result is Negative.  Fact Sheet for Patients:  BloggerCourse.com  Fact Sheet for Healthcare Providers:  SeriousBroker.it  This test is no t yet approved or cleared by the [place]  FDA and  has been authorized for detection and/or diagnosis of SARS-CoV-2 by FDA under an Emergency Use Authorization (EUA). This EUA will remain  in effect (meaning this test can be used) for the duration of the COVID-19 declaration under Section 564(b)(1) of the Act, 21 U.S.C.section 360bbb-3(b)(1), unless the authorization is terminated  or revoked sooner.       Influenza A by PCR NEGATIVE NEGATIVE Final   Influenza B by PCR NEGATIVE NEGATIVE Final    Comment: (NOTE) The Xpert Xpress SARS-CoV-2/FLU/RSV plus assay is intended as an aid in the diagnosis of influenza from Nasopharyngeal swab specimens and should not be used as a sole basis for treatment. Nasal washings and aspirates are unacceptable for  Xpert Xpress SARS-CoV-2/FLU/RSV testing.  Fact Sheet for Patients: BloggerCourse.com  Fact Sheet for Healthcare Providers: SeriousBroker.it  This test is not yet approved or cleared by the [place]  FDA and has been authorized for detection and/or diagnosis of SARS-CoV-2 by FDA under an Emergency Use Authorization (EUA). This EUA will remain in effect (meaning this test can be used) for the duration of the COVID-19 declaration under Section 564(b)(1) of the Act, 21 U.S.C. section 360bbb-3(b)(1), unless the authorization is terminated or revoked.     Resp Syncytial Virus by PCR NEGATIVE NEGATIVE Final    Comment: (NOTE) Fact Sheet for Patients: BloggerCourse.com  Fact Sheet for Healthcare Providers: SeriousBroker.it  This test is not yet approved or cleared by the [place]  FDA and has been authorized for detection and/or diagnosis of SARS-CoV-2 by FDA under an Emergency Use Authorization (EUA). This EUA will remain in effect (meaning this test can be used) for the duration of the COVID-19 declaration under Section 564(b)(1) of the Act, 21 U.S.C. section 360bbb-3(b)(1), unless the authorization is terminated or revoked.  Performed at [facility], [address] [place], [place] [address]   Blood culture (routine x 2)  Status: None   Collection Time: 07/17/23  8:42 PM   Specimen: BLOOD  Result Value Ref Range Status   Specimen Description   Final    BLOOD RIGHT FOREARM Performed at [facility], [address], [place], [place] [address]    Special Requests   Final    BOTTLES DRAWN AEROBIC AND ANAEROBIC Blood Culture adequate volume Performed at [facility], [address], [place], [place] [address]    Culture   Final    NO GROWTH 6 DAYS Performed at [facility], [address], [place], [place] [address]    Report  Status 07/23/2023 FINAL  Final  Blood culture (routine x 2)     Status: None   Collection Time: 07/17/23 10:33 PM   Specimen: BLOOD  Result Value Ref Range Status   Specimen Description   Final    BLOOD RIGHT ASSIST CONTROL Performed at [facility], [address] [place], [place] [address]    Special Requests   Final    BOTTLES DRAWN AEROBIC AND ANAEROBIC Blood Culture results may not be optimal due to an inadequate volume of blood received in culture bottles Performed at [facility], [address] [place], [place] [address]    Culture   Final    NO GROWTH 6 DAYS Performed at [facility], [address] [place], [place] [address]    Report Status 07/23/2023 FINAL  Final     Time coordinating discharge: 35 minutes  SIGNED:   Calvin KATHEE Robson, MD  [facility] 07/24/2023, 2:06 PM Pager   If 7PM-7AM, please contact night-coverage

## 2023-07-24 NOTE — Plan of Care (Signed)

## 2023-07-24 NOTE — Progress Notes (Addendum)
 [facility]  ROUNDING NOTE   Subjective:  [name] was admitted to [facility] on 07/17/2023 for Acute pulmonary edema (HCC) [J81.0] SOB (shortness of breath) [R06.02] Elevated troponin [R79.89]   Patient was having 3 to 4 days of progressive shortness of breath and peripheral edema.    Last seen by Nephrology on 5/7 by Dr. Marcelino.    Update: Sitting up in chair, daughter in room. On room air, no complaints. Lower extremity remains.   Creatinine 3.60  Objective:  Vital signs in last 24 hours:  Temp:  [97.6 F (36.4 C)-98.1 F (36.7 C)] 98.1 F (36.7 C) (07/12 0744) Pulse Rate:  [51-56] 53 (07/12 1028) Resp:  [16-20] 16 (07/12 0744) BP: (111-130)/(46-54) 127/54 (07/12 0744) SpO2:  [97 %-100 %] 98 % (07/12 0744) Weight:  [124.2 kg] 124.2 kg (07/12 0500)  Weight change: 5.3 kg Filed Weights   07/22/23 0430 07/23/23 0500 07/24/23 0500  Weight: 119.3 kg 118.9 kg 124.2 kg    Intake/Output: I/O last 3 completed shifts: In: 360 [P.O.:360] Out: 600 [Urine:600]   Intake/Output this shift:  Total I/O In: 240 [P.O.:240] Out: -   Physical Exam: General: NAD, sitting up in chair  Head: Normocephalic, atraumatic. Moist oral mucosal membranes  Eyes: Anicteric,    Neck: Supple  Lungs:  diminished  Heart: Regular rate and rhythm  Abdomen:  Soft, nontender,   Extremities:  +1 peripheral edema.  Neurologic: Alert and awake  Skin: No lesions  Access: Left AVF      Basic Metabolic Panel: Recent Labs  Lab 07/18/23 0342 07/19/23 0748 07/20/23 0447 07/21/23 0452 07/22/23 0453 07/23/23 0619 07/24/23 0932  NA 142 144  143 143 141 140 139 137  K 3.2* 3.6  3.6 3.6 3.7 4.1 4.3 4.6  CL 109 104  103 105 102 102 99 100  CO2 23 27  26 28 27 27 27 27   GLUCOSE 110* 94  93 89 84 77 142* 163*  BUN 39* 43*  42* 51* 59* 61* 58* 60*  CREATININE 3.00* 3.26*  3.31* 3.69* 4.11* 3.95* 3.79* 3.60*  CALCIUM 8.4* 9.1  9.1 8.8* 8.7* 8.5* 8.8* 8.5*  MG  --  2.0 2.2  2.3 2.2 2.6*  --   PHOS 2.9  --  3.0  --   --  3.6  --     Liver Function Tests: Recent Labs  Lab 07/17/23 2042 07/18/23 0342 07/23/23 0619  AST 23  --   --   ALT 35  --   --   ALKPHOS 54  --   --   BILITOT 1.0  --   --   PROT 6.6  --   --   ALBUMIN 3.2* 2.9* 2.7*   No results for input(s): LIPASE, AMYLASE in the last 168 hours. No results for input(s): AMMONIA in the last 168 hours.  CBC: Recent Labs  Lab 07/19/23 0748 07/20/23 0447 07/21/23 0452 07/22/23 0453 07/23/23 0619  WBC 8.1 7.9 6.0 5.1 6.4  HGB 11.1* 10.0* 9.2* 9.1* 10.3*  HCT 36.0* 31.2* 29.7* 29.3* 32.4*  MCV 83.3 82.5 81.6 81.8 81.6  PLT 221 193 197 207 230    Cardiac Enzymes: No results for input(s): CKTOTAL, CKMB, CKMBINDEX, TROPONINI in the last 168 hours.  BNP: Invalid input(s): POCBNP  CBG: Recent Labs  Lab 07/23/23 0719 07/23/23 1202 07/23/23 1600 07/23/23 2107 07/24/23 0744  GLUCAP 137* 127* 107* 169* 114*    Microbiology: Results for orders placed or performed during  the hospital encounter of 07/17/23  Resp panel by RT-PCR (RSV, Flu A&B, Covid) Anterior Nasal Swab     Status: None   Collection Time: 07/17/23  8:42 PM   Specimen: Anterior Nasal Swab  Result Value Ref Range Status   SARS Coronavirus 2 by RT PCR NEGATIVE NEGATIVE Final    Comment: (NOTE) SARS-CoV-2 target nucleic acids are NOT DETECTED.  The SARS-CoV-2 RNA is generally detectable in upper respiratory specimens during the acute phase of infection. The lowest concentration of SARS-CoV-2 viral copies this assay can detect is 138 copies/mL. A negative result does not preclude SARS-Cov-2 infection and should not be used as the sole basis for treatment or other patient management decisions. A negative result may occur with  improper specimen collection/handling, submission of specimen other than nasopharyngeal swab, presence of viral mutation(s) within the areas targeted by this assay, and inadequate  number of viral copies(<138 copies/mL). A negative result must be combined with clinical observations, patient history, and epidemiological information. The expected result is Negative.  Fact Sheet for Patients:  BloggerCourse.com  Fact Sheet for Healthcare Providers:  SeriousBroker.it  This test is no t yet approved or cleared by the [place]  FDA and  has been authorized for detection and/or diagnosis of SARS-CoV-2 by FDA under an Emergency Use Authorization (EUA). This EUA will remain  in effect (meaning this test can be used) for the duration of the COVID-19 declaration under Section 564(b)(1) of the Act, 21 U.S.C.section 360bbb-3(b)(1), unless the authorization is terminated  or revoked sooner.       Influenza A by PCR NEGATIVE NEGATIVE Final   Influenza B by PCR NEGATIVE NEGATIVE Final    Comment: (NOTE) The Xpert Xpress SARS-CoV-2/FLU/RSV plus assay is intended as an aid in the diagnosis of influenza from Nasopharyngeal swab specimens and should not be used as a sole basis for treatment. Nasal washings and aspirates are unacceptable for Xpert Xpress SARS-CoV-2/FLU/RSV testing.  Fact Sheet for Patients: BloggerCourse.com  Fact Sheet for Healthcare Providers: SeriousBroker.it  This test is not yet approved or cleared by the [place]  FDA and has been authorized for detection and/or diagnosis of SARS-CoV-2 by FDA under an Emergency Use Authorization (EUA). This EUA will remain in effect (meaning this test can be used) for the duration of the COVID-19 declaration under Section 564(b)(1) of the Act, 21 U.S.C. section 360bbb-3(b)(1), unless the authorization is terminated or revoked.     Resp Syncytial Virus by PCR NEGATIVE NEGATIVE Final    Comment: (NOTE) Fact Sheet for Patients: BloggerCourse.com  Fact Sheet for Healthcare  Providers: SeriousBroker.it  This test is not yet approved or cleared by the [place]  FDA and has been authorized for detection and/or diagnosis of SARS-CoV-2 by FDA under an Emergency Use Authorization (EUA). This EUA will remain in effect (meaning this test can be used) for the duration of the COVID-19 declaration under Section 564(b)(1) of the Act, 21 U.S.C. section 360bbb-3(b)(1), unless the authorization is terminated or revoked.  Performed at [facility], [address] [place], [place] [address]   Blood culture (routine x 2)     Status: None   Collection Time: 07/17/23  8:42 PM   Specimen: BLOOD  Result Value Ref Range Status   Specimen Description   Final    BLOOD RIGHT FOREARM Performed at [facility], [address], [place], [place] [address]    Special Requests   Final    BOTTLES DRAWN AEROBIC AND ANAEROBIC Blood Culture adequate volume  Performed at [facility], [address], [place], [place] [address]    Culture   Final    NO GROWTH 6 DAYS Performed at [facility], [address], [place], [place] [address]    Report Status 07/23/2023 FINAL  Final  Blood culture (routine x 2)     Status: None   Collection Time: 07/17/23 10:33 PM   Specimen: BLOOD  Result Value Ref Range Status   Specimen Description   Final    BLOOD RIGHT ASSIST CONTROL Performed at [facility], [address] [place], [place] [address]    Special Requests   Final    BOTTLES DRAWN AEROBIC AND ANAEROBIC Blood Culture results may not be optimal due to an inadequate volume of blood received in culture bottles Performed at [facility], [address], [place], [place] [address]    Culture   Final    NO GROWTH 6 DAYS Performed at [facility], [address] [place], [place] [address]    Report Status 07/23/2023 FINAL  Final    Coagulation Studies: No results for input(s): LABPROT, INR  in the last 72 hours.  Urinalysis: No results for input(s): COLORURINE, LABSPEC, PHURINE, GLUCOSEU, HGBUR, BILIRUBINUR, KETONESUR, PROTEINUR, UROBILINOGEN, NITRITE, LEUKOCYTESUR in the last 72 hours.  Invalid input(s): APPERANCEUR    Imaging: No results found.   Medications:     ALPRAZolam   0.5 mg Oral BID   amiodarone   400 mg Oral BID   Followed by   NOREEN ON 07/31/2023] amiodarone   200 mg Oral Daily   apixaban   2.5 mg Oral BID   cholecalciferol   5,000 Units Oral Daily   clopidogrel   75 mg Oral Daily   diclofenac  Sodium  4 g Topical QID   donepezil   10 mg Oral QHS   enalapril   20 mg Oral BID   finasteride   5 mg Oral Daily   furosemide   40 mg Oral QODAY   glipiZIDE   5 mg Oral Q breakfast   insulin  aspart  0-6 Units Subcutaneous TID WC   metoprolol  succinate  12.5 mg Oral Daily   sodium bicarbonate   650 mg Oral BID   tamsulosin   0.4 mg Oral Daily   acetaminophen  **OR** acetaminophen , ondansetron  **OR** ondansetron  (ZOFRAN ) IV, polyethylene glycol, senna-docusate  Assessment/ Plan:  Mr. HUZAIFA VINEY is a 83 y.o.  male with chronic kidney disease stage IV, hypertension, diabetes mellitus type II, BPH, peripheral arterial disease, and diastolic congestive heart failure who presents on 07/17/2023 for Acute pulmonary edema (HCC) [J81.0] SOB (shortness of breath) [R06.02] Elevated troponin [R79.89]   Chronic Kidney Disease stage IV with proteinuria: baseline creatinine 3.44, GFR of 17.  - Creatinine improved.  - Can restart home regimen. Lasix  40 mg every other day. May need to be adjusted as outpatient  - continue enalapril  and sodium bicarb   Acute exacerbation of chronic diastolic congestive heart failure -Furosemide  and spironolactone  held due to kidney injury.  Restarted.     Hypertension with chronic kidney disease - Blood pressure 127/54, acceptable control   Diabetes mellitus type II with chronic kidney disease: hemoglobin A1c of 5.2% -  Primary team to manage SSI   Hypokalemia: with history of hyperkalemia - Oral potassium chloride  supplementation as needed Potassium in normal range   6.  Acute gout flare, suspected.  Diuretic therapy can induce gout.  Improved with prednisone    LOS: 7 Lour[place]ee SHAUNNA Dines 7/12/20254:58 PM  Patient was seen and examined with Dines,  [place], NP.  Plan of care was formulated for the problems addressed and discussed with the nurse practitioner. I agree with the note as documented except as noted below.

## 2023-07-29 NOTE — Progress Notes (Signed)
 Follow Up Visit   Patient Name: Jeffery Joseph, male   Patient DOB: 14-Aug-1940 Date of Service: 07/29/2023  Patient MRN: [id number] Provider Creating Note: Bonnell Sherry, MD  [phone] Primary Care Physician:   [address] [place] [address] Additional Physicians/ Providers:    History of Present Illness Jeffery Joseph is a 83 y.o. male who is following up today for acute kidney injury, chronic kidney disease stage IV, proteinuria, hypertension, anemia of chronic kidney disease.  The patient had recent hospitalization for volume overload and chronic diastolic heart failure.  With diuresis the patient's renal function actually deteriorated.  We had to hold diuretics and EGFR was 16 upon discharge.  Patient had posthospitalization labs performed and EGFR was down a bit to 14 with a urine protein creatinine ratio of 0.8.  And about hypertension blood pressure currently 120/60.  He has associated anemia chronic kidney disease most recent hemoglobin 11.3.  Finally patient also has secondary hyperparathyroidism and most recent PTH 77, phosphorus 4.0, calcium 9.4.  Volume status does appear to be similar to what it was when he was discharged from the hospital.  Medications   Current Outpatient Medications:  .  amiodarone  (PACERONE ) 200 MG tablet, Take 200 mg by mouth in the morning and 200 mg in the evening., Disp: , Rfl:  .  apixaban  (ELIQUIS ) 2.5 MG tablet, Take 2.5 mg by mouth in the morning and 2.5 mg in the evening., Disp: , Rfl:  .  ALPRAZolam  (XANAX ) 0.5 MG tablet, TAKE 1 TABLET (0.5 MG TOTAL) BY MOUTH 3 (THREE) TIMES DAILY AS NEEDED FOR SLEEP FOR UP TO 30 DAYS, Disp: , Rfl:  .  Cholecalciferol  (Vitamin D -3) 25 MCG (1000 UT) capsule, Take 2 capsules by mouth 1 (one) time each day, Disp: , Rfl:  .  clopidogrel  (PLAVIX ) 75 MG tablet, Take 75 mg by mouth 1 (one) time each day, Disp: , Rfl:  .  donepezil  (ARICEPT ) 10 MG tablet, Take 10 mg by mouth in the morning., Disp: , Rfl:  .  enalapril   (VASOTEC ) 20 MG tablet, Comments:   Filled Date: Jul 10 2014  1:42PM  Patient Notes: TAKE 1 TABLET TWICE DAILY  Duration: 90, Disp: , Rfl:  .  finasteride  (PROSCAR ) 5 MG tablet, Take 5 mg by mouth, Disp: , Rfl:  .  furosemide  (Lasix ) 40 MG tablet, Take 1 tablet (40 mg total) by mouth every other day, Disp: 45 tablet, Rfl: 3 .  glipiZIDE  (GLUCOTROL  XL) 5 MG 24 hr tablet, Take 5 mg by mouth, Disp: , Rfl:  .  metoprolol  tartrate (LOPRESSOR ) 25 MG tablet, Take 25 mg by mouth, Disp: , Rfl:  .  sodium bicarbonate  650 MG tablet, TAKE 1 TABLET (650 MG TOTAL) BY MOUTH IN THE MORNING AND IN THE EVENING, Disp: 180 tablet, Rfl: 3 .  Sodium Zirconium Cyclosilicate 10 g pack, Take 10 g by mouth 1 (one) time each day, Disp: 30 each, Rfl: 11 .  tamsulosin  (FLOMAX ) 0.4 MG 24 hr capsule, Take 0.4 mg by mouth, Disp: , Rfl:    Allergies Patient has no known allergies.  Problem List Patient Active Problem List  Diagnosis  . Stage 3 chronic kidney disease (HCC)  . Benign essential hypertension  . Congenital multiple renal cysts  . Vitamin D  deficiency  . Anemia in chronic kidney disease     Review of Systems  Constitutional:  Negative for chills and fever.  Respiratory:  Negative for cough and shortness of breath.   Cardiovascular:  Positive for leg swelling. Negative for chest pain and palpitations.  Gastrointestinal:  Negative for nausea and vomiting.  Genitourinary:  Negative for dysuria, hematuria and urgency.     History Past Medical History:  Diagnosis Date  . Anemia in chronic kidney disease 10/27/2018  . Anxiety   . Benign essential hypertension 10/27/2018  . Chronic kidney disease, stage 3 unspecified 10/27/2018  . Congenital multiple renal cysts 10/27/2018  . Diabetes mellitus (HCC)   . Osteoarthrosis, unspecified whether generalized or localized, involving unspecified site   . Vitamin D  deficiency 10/27/2018    History reviewed. No pertinent surgical history. Family History   Problem Relation Age of Onset  . Hypertension Mother   . Dementia Mother    Social History   Tobacco Use  . Smoking status: Former    Current packs/day: 0.00    Types: Cigarettes    Quit date: 01/12/1958    Years since quitting: 65.5  . Smokeless tobacco: Not on file  Substance Use Topics  . Alcohol use: Not on file        Physical Exam  Vitals BP 124/60 (BP Location: Right upper arm, Patient Position: Sitting)   Pulse 57   Temp 98 F   SpO2 99%   PHYSICAL EXAM: General appearance: well developed, well nourished, NAD Eyes: anicteric sclerae, moist conjunctivae; no lid-lag  HENT: Atraumatic; hearing intact Neck: Trachea midline; supple Lungs: CTAB, with normal respiratory effort  CV: S1S2, 2/6 SEM Abdomen: Soft, non-tender; bowel sounds positive Extremities: 1+ bilateral lower extremity edema Skin: Warm and dry, normal skin turgor, no rashes noted. Psych: Appropriate affect, alert and oriented to person, place and time  Access: Left upper extremity AV fistula with good bruit and thrill.   Laboratory Studies  Chemistry  Lab Units 07/28/23 0908 05/12/23 0907 03/10/23 0907 01/20/23 1013 11/25/22 0826 11/11/22 1003 09/30/22 1030  SODIUM mmol/L 140 140 142 141  --  142 142  POTASSIUM mmol/L 4.6 4.7 4.3 4.5  --  4.5 4.8  CHLORIDE mmol/L 102 106 107 109  --  108 112*  CO2 mmol/L 30 24 26 21   --  26 23  CALCIUM mg/dL 9.4 9.2 9.1 9.6  --  89.4* 9.8  PHOSPHORUS mg/dL 4.0 3.8 4.0 4.0  --  4.4* 3.3  PTH pg/mL 73 119* 81* 43  --  21 46  GLUCOSE mg/dL 80 898 92 84  --  92 83  ALBUMIN g/dL 3.5* 4.0 3.9 4.0  --  4.2 4.1  BUN mg/dL 47* 48* 47* 49*  --  51* 53*  CREATININE mg/dL 6.04* 6.55* 6.86* 6.72*  --  3.07* 3.19*  HEMOGLOBIN A1C %  --   --   --   --  5.3  --   --         No lab exists for component: IRON SATURATION, TRANSSATPER  CBC  Lab Units 07/28/23 0908 05/12/23 0907 03/10/23 0907 01/20/23 1013 11/11/22 1003 09/30/22 1030  WBC AUTO Thousand/uL  4.6 4.9 5.2 5.1 6.1 5.0  HEMOGLOBIN g/dL 88.6* 88.8* 89.4* 89.1* 10.1* 9.4*  HEMOGLOBIN URINE   --   --  NEGATIVE  --   --   --   HEMATOCRIT % 37.7* 36.4* 34.9* 36.6* 34.7* 31.3*  MCV fL 86.9 85.2 84.5 84.3 90.6 92.3  PLATELETS AUTO Thousand/uL 345 184 188 211 220 154    Urine  Lab Units 05/12/23 0907 03/10/23 0907 01/20/23 1013  COLOR U   --  YELLOW  --   KETONES  U MG/DL   --  NEGATIVE  --   PROT/CREAT RATIO UR mg/g creat 0.803*  803* 0.813*  813* 0.382*  382*    Lab Results  Component Value Date   PTH 73 07/28/2023   CALCIUM 9.4 07/28/2023   PHOS 4.0 07/28/2023     Imaging and Other Studies     Orders Placed This Encounter  . Renal Function Panel  . CBC and Differential  . PTH, Intact  . Protein, Total, Random Urine w/Creatinine (Protein/Creat Ratio)        Impression/Recommendations  Jeffery Joseph is a 83 y.o. male with past medical history of diabetes mellitus, extensive NSAID use, hypertension, BPH, anxiety, osteoarthritis, fistula placement 11/14/2021 who returns for followup of chronic kidney disease.  1.  Acute kidney injury/chronic kidney disease stage IV/proteinuria.  The patient's renal function continues to fluctuate a bit.  Most recent EGFR is 14 mL urine protein creatinine ratio of 0.8.  Maintain the patient on enalapril .  No immediate need for dialysis but we are having ongoing discussions about initiation.  He appears to have a functional fistula at this time.  2.  Hypertension.  Blood pressure better today 120/60.  Maintain the patient on enalapril , metoprolol .  3.  Anemia of chronic kidney disease.  Most recent hemoglobin was 11.3.  No indication for Procrit.  Repeat CBC today.  4.  Secondary hyperparathyroidism.  Most recent PTH was 77, phosphorus 4.0, calcium 9.4.  Maintain the patient on cholecalciferol  and monitor volume of the levels and parameters.    Return in about 2 weeks (around 08/12/2023).   Munsoor Lateef, MD

## 2023-08-04 NOTE — Progress Notes (Addendum)
 [facility] NEW PATIENT/CONSULT NOTE  REFERRING PHYSICIANS/PROVIDERS:   Patient Care Team    Relationship Specialty Notifications Start End  Epifanio Alm Dover, MD PCP - General Internal Medicine  11/03/18    Address:  [address] [place] [address]  Florencio Cara Endow, MD  Electrocardiogram and rhythm strip (today): [facility] interpretation follows:]  Atrial flutter 69 bpm QRS 150 right bundle branch block left axis left anterior fascicular block frequent PVCs left bundle transition V3 positive to 3 mostly negative in lead I; Flutter waves are negative to 3 positive in V1 atrial cycle length about 240 ms.  PVCs are negative in aVR aVL small R wave V1 V2 transition in V3 to monophasic R wave V3 through V6   PRIOR: -07/19/2023 at 1314 AFL 128 bpm, pvcs -07/17/2023 2025 atrial rhythm    IMPRESSION:   Atrial flutter, probably typical diagnosed in hospital in July associated with pulmonary edema and shortness of breath.  Symptomatically improved.  Does not have palpitations.  Remains in atrial flutter despite amiodarone  IV then oral load and currently on 200 mg daily.  We discussed cardioversion but he is had bad effects after anesthesia with setbacks in terms of his energy and and other issues and he and his daughter and nurse are reluctant to pursue that at this time but will think about it and discuss it more with Dr. Florencio.  Would continue the amiodarone  at this time at least for rate control in view of his severe pulmonary edema before but long-term amiodarone  has potential risks of side effects and if its only being used for rate control would consider stopping it watching the rate using beta-blockers etc.  If he undergoes cardioversion then is in normal rhythm again decision on whether to continue amiodarone  or not.  We discussed catheter ablation but and his daughter (an Charity fundraiser) are reluctant to undergo invasive procedures & schedule right okay yes okay please  get he has advanced directives against resuscitation etc. though he is planning to undergo renal dialysis beginning soon.  Stroke prevention continue Eliquis  he is on reduced dose due to his kidney function and age greater than 21  Amiodarone : See above will need LFT thyroid function and PFTs versus chest x-ray per usual protocol if he continues it as well as close physical exam for pulmonary crackles/rales  PVCs several PVCs likely LVOT noted on ECG today follow  Other CV dz Probably acute diastolic heart failure with normal systolic function on echo mild AS mild AR and mild to moderate TR noted; to severe pulmonary hypertension withTR Peak grad:   59.0 mmHg    RECOMMENDATIONS and PLAN:   Our plans include:  1. Continue eliquis  2. Consider a kardia monitor to get a more accurate heart rate 3. Consider cardioversion--reluctant to schedule at this time due to side effects with anesthesia in the past 4. Decision on continuing amiodarone  or not if cardioversion done; long-term monitoring for amiodarone  if he continues on it including thyroid liver and pulmonary function 5. Consider ablation --his daughter is essentially certain that he would not want to undergo this  DEMOGRAPHIC INFORMATION   Patient Profile: Jeffery Joseph is a 83 y.o. male The patient resides in [place] in the county of [place]    HISTORY:  Chief Complaint: he seen for evaluation of  AFL  HPI:   History of Present Illness Jeffery Joseph is an 83 year old male with atrial flutter who presents with shortness of breath and swelling.  Earlier this month, he was hospitalized due to shortness of breath and swelling that began a few days prior to admission. During his hospital stay, fluid removal significantly improved his symptoms. Since discharge, he has felt better daily and has not experienced a recurrence of shortness of breath. He uses a walker for mobility and can walk from his den to his living room.  Atrial  flutter was diagnosed during his hospital stay, which is a new diagnosis. He is currently on amiodarone  200 mg daily. He is also on Eliquis  (apixaban ) 2.5 mg twice daily due to his age and kidney function, and clopidogrel  for stents in his arm. He has a history of low pulse rates, typically in the fifties, and has been on metoprolol  in the past for blood pressure management, but is not currently taking it due to concerns about his low pulse rate.  No episodes of passing out, chest pain, or palpitations. He was able to lie down flat before his hospital admission as long as he was sitting up. He feels good every morning and has not experienced any pain since his hospital discharge.     Family history of arrhythmias, sudden death, implanted devices or inherited CV disease:  -  Other critical historical data relating to future EP Procedures:  Prior anesthesia or surgical complications, contraindications to MRI or CTA or TEE or potential need for stress dose steroids OR recent/ongoing immunosuppressive therapy: -See above has had overall setbacks after anesthesia in the past  Review of prior external notes and  results:  07/17/2023 admission [facility] 83 y.o. 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, T2DM, HTN, PAD, arthritis, anxiety and depression, anemia of chronic disease, chronic hyperkalemia and BPH who presented to the ED for evaluation of shortness of breath and admitted for acute pulmonary edema   7/6: Cardiology and nephrology consult 7/7: transfer to PCU, amio started 7/8: PT, OT eval DME ordered for home, stopping hydralazine  as blood pressure running low, Eliquis  started  Discharge Diagnoses:  Principal Problem: Acute pulmonary edema (HCC) Active Problems: Elevated troponin Hypertensive emergency SOB (shortness of breath) Hypokalemia  # Acute pulmonary edema in the setting of hypertensive emergency # Acute on chronic HFpEF  - Patient with 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 Lasix  on hold on 7/9 due to rising creatinine - Back to baseline volume status Plan: Discharge home. Restart home Lasix  dosing. 40 mg q. OD. Hold Aldactone  until outpatient evaluation by nephrology.  New onset A.flutter/RVR HR in 120-130s, asymptomatic Was on amiodarone  gtt. This was weaned off. Restarted on 7/8. Heart rate improved. Plan: P.o. amiodarone  taper. Follow-up outpatient EP clinic  amiodarone  200 MG tablet Commonly known as: PACERONE  Take 2 tablets (400 mg total) by mouth 2 (two) times daily for 7 days, THEN 1 tablet (200 mg total) daily. Start taking on: July 24, 2023   Echo 07/18/23 LVEF 55-60 Mild-mod TR Mild AR Mild AS    Medications:  has a current medication list which includes the following prescription(s): alprazolam , amiodarone , apixaban , cholecalciferol , clopidogrel , diclofenac , docusate, donepezil , enalapril , finasteride , furosemide , glipizide , lokelma, sodium bicarbonate , tamsulosin , amlodipine , hydralazine , metoprolol  succinate, metoprolol  tartrate, and veltassa.  EXAMINATION:  Vital Signs: Vitals:   08/04/23 1109  BP: (!) 148/62  Pulse: 73  SpO2: 98%  Weight: ROLLEN)  111.1 kg (245 lb)  Height: 195.6 cm (6' 5)   Body mass index is 29.05 kg/m. Lungs clear Cardiovascular irregularly irregular 1/6 systolic ejection murmur ADMINISTRATIVE:  This note was generated in part with voice recognition software; all errors may not have been discovered and edited. TIME: I spent a total of 46-51minutes in both face-to-face or non-face-to-face activities, excluding procedures performed, on date of encounter.

## 2023-08-12 ENCOUNTER — Ambulatory Visit: Payer: Self-pay | Admitting: Urology

## 2023-08-20 ENCOUNTER — Telehealth (INDEPENDENT_AMBULATORY_CARE_PROVIDER_SITE_OTHER): Payer: Self-pay | Admitting: Nurse Practitioner

## 2023-08-20 NOTE — Telephone Encounter (Signed)
 Patient daughter was notified that requesting rx has 11 refills. Patient daughter will reach out to the pharmacy to have Plavix  refilled.

## 2023-08-20 NOTE — Telephone Encounter (Signed)
 Daughter called regarding patient's Plavix . States they need a refill on medication. Please advise.

## 2023-08-23 ENCOUNTER — Emergency Department
Admission: EM | Admit: 2023-08-23 | Discharge: 2023-08-23 | Disposition: A | Discharge status: Home / Self Care | Attending: Emergency Medicine | Admitting: Emergency Medicine

## 2023-08-23 ENCOUNTER — Other Ambulatory Visit: Payer: Self-pay

## 2023-08-23 DIAGNOSIS — Z992 Dependence on renal dialysis: Secondary | ICD-10-CM | POA: Diagnosis not present

## 2023-08-23 DIAGNOSIS — R310 Gross hematuria: Secondary | ICD-10-CM | POA: Insufficient documentation

## 2023-08-23 DIAGNOSIS — E1122 Type 2 diabetes mellitus with diabetic chronic kidney disease: Secondary | ICD-10-CM | POA: Diagnosis not present

## 2023-08-23 DIAGNOSIS — N186 End stage renal disease: Secondary | ICD-10-CM | POA: Insufficient documentation

## 2023-08-23 DIAGNOSIS — I120 Hypertensive chronic kidney disease with stage 5 chronic kidney disease or end stage renal disease: Secondary | ICD-10-CM | POA: Insufficient documentation

## 2023-08-23 LAB — CBC
HCT: 34.2 % — ABNORMAL LOW (ref 39.0–52.0)
Hemoglobin: 10.4 g/dL — ABNORMAL LOW (ref 13.0–17.0)
MCH: 25.2 pg — ABNORMAL LOW (ref 26.0–34.0)
MCHC: 30.4 g/dL (ref 30.0–36.0)
MCV: 83 fL (ref 80.0–100.0)
Platelets: 206 K/uL (ref 150–400)
RBC: 4.12 MIL/uL — ABNORMAL LOW (ref 4.22–5.81)
RDW: 15.6 % — ABNORMAL HIGH (ref 11.5–15.5)
WBC: 4.7 K/uL (ref 4.0–10.5)
nRBC: 0 % (ref 0.0–0.2)

## 2023-08-23 LAB — URINALYSIS, ROUTINE W REFLEX MICROSCOPIC
Bilirubin Urine: NEGATIVE
Glucose, UA: NEGATIVE mg/dL
Ketones, ur: NEGATIVE mg/dL
Nitrite: NEGATIVE
Protein, ur: 30 mg/dL — AB
RBC / HPF: >50 RBC/hpf (ref 0–5)
Specific Gravity, Urine: 1.015 (ref 1.005–1.030)
pH: 6 (ref 5.0–8.0)

## 2023-08-23 LAB — BASIC METABOLIC PANEL WITH GFR
Anion gap: 10 (ref 5–15)
BUN: 55 mg/dL — ABNORMAL HIGH (ref 8–23)
CO2: 29 mmol/L (ref 22–32)
Calcium: 9.3 mg/dL (ref 8.9–10.3)
Chloride: 102 mmol/L (ref 98–111)
Creatinine, Ser: 3.63 mg/dL — ABNORMAL HIGH (ref 0.61–1.24)
GFR, Estimated: 16 mL/min — ABNORMAL LOW (ref >60)
Glucose, Bld: 67 mg/dL — ABNORMAL LOW (ref 70–99)
Potassium: 3 mmol/L — ABNORMAL LOW (ref 3.5–5.1)
Sodium: 141 mmol/L (ref 135–145)

## 2023-08-23 NOTE — ED Provider Notes (Signed)
 [facility] Emergency Department Provider Note     Event Date/Time   First MD Initiated Contact with Patient 08/23/23 [id number]     (approximate)   History   Hematuria   HPI  Jeffery Joseph is a 83 y.o. male with a PMHx of HTN, CKD, DM and BPH presents to the ED for complaint of blood in urine noticed this morning. Does states it stung a little bit when urinating this morning, but when he urinated again it was normal. History of ESRD recently started on dialysis last Wednesday. He was suppose to go to dialysis this morning, but due to the hematuria presented to the ED. He is currently in no pain. Recently started on anticoagulations 1 month ago, plavix  and eliquis .     Physical Exam   Triage Vital Signs: ED Triage Vitals  Encounter Vitals Group     BP 08/23/23 0916 (!) 157/68     Girls Systolic BP Percentile --      Girls Diastolic BP Percentile --      Boys Systolic BP Percentile --      Boys Diastolic BP Percentile --      Pulse Rate 08/23/23 0916 75     Resp 08/23/23 0916 18     Temp 08/23/23 0916 98.2 F (36.8 C)     Temp src --      SpO2 08/23/23 0916 100 %     Weight 08/23/23 0915 238 lb (108 kg)     Height 08/23/23 0915 6' 5 (1.956 m)     Head Circumference --      Peak Flow --      Pain Score 08/23/23 0915 0     Pain Loc --      Pain Education --      Exclude from Growth Chart --     Most recent vital signs: Vitals:   08/23/23 0916 08/23/23 1325  BP: (!) 157/68 (!) 150/70  Pulse: 75 70  Resp: 18 18  Temp: 98.2 F (36.8 C)   SpO2: 100% 100%    General Awake, no distress.  HEENT NCAT.  CV:  Good peripheral perfusion.  RESP:  Normal effort.  ABD:  No distention. Soft. Nontender to palpation. No CVA tenderness.   ED Results / Procedures / Treatments   Labs (all labs ordered are listed, but only abnormal results are displayed) Labs Reviewed  URINALYSIS, ROUTINE W REFLEX MICROSCOPIC - Abnormal; Notable for the following  components:      Result Value   Color, Urine AMBER (*)    APPearance HAZY (*)    Hgb urine dipstick LARGE (*)    Protein, ur 30 (*)    Leukocytes,Ua TRACE (*)    Bacteria, UA RARE (*)    All other components within normal limits  BASIC METABOLIC PANEL WITH GFR - Abnormal; Notable for the following components:   Potassium 3.0 (*)    Glucose, Bld 67 (*)    BUN 55 (*)    Creatinine, Ser 3.63 (*)    GFR, Estimated 16 (*)    All other components within normal limits  CBC - Abnormal; Notable for the following components:   RBC 4.12 (*)    Hemoglobin 10.4 (*)    HCT 34.2 (*)    MCH 25.2 (*)    RDW 15.6 (*)    All other components within normal limits  URINE CULTURE   No results found.  PROCEDURES:  Critical Care performed: No  Procedures  MEDICATIONS ORDERED IN ED: Medications - No data to display   IMPRESSION / MDM / ASSESSMENT AND PLAN / ED COURSE  I reviewed the triage vital signs and the nursing notes.                               83 y.o. male presents to the emergency department for evaluation and treatment of acute hematuria. See HPI for further details.   Differential diagnosis includes, but is not limited to UTI, electrolyte dysfunction, bladder cancer, nephrolithiasis considered but low suspicion given no pain   Patient's presentation is most consistent with acute complicated illness / injury requiring diagnostic workup.  Will obtain BMP to assess potassium, CBC for hgb stability and urinalysis. Shared visit with Dr. Suzanne, who is in agreement with this plan.   U/A reveals no obvious infectious etiology. Urine culture pending. Given Hgb in urine will advise urology follow up for further evaluation. Labs consistent with trend. Improved hgb 10.3 -- > 10.4. Potassium 3.0, no indication for emergent hemodialysis.   Patient stale for discharge and outpatient management. Encouraged to continue to go to his dialysis on Wednesday in which he has agreed to.  FINAL  CLINICAL IMPRESSION(S) / ED DIAGNOSES   Final diagnoses:  Gross hematuria   Rx / DC Orders   ED Discharge Orders     None       Note:  This document was prepared using Dragon voice recognition software and may include unintentional dictation errors.    Margrette, Prisha Hiley A, PA-C 08/23/23 1436    Suzanne Kirsch, MD 08/23/23 1614

## 2023-08-23 NOTE — ED Triage Notes (Addendum)
 Pt comes with hematuria. Pt was suppose to go to dialysis today. Pt made urine earlier this morning around 2am  and it didn't  have any blood in it. Pt made urine later and it was on his pad. Pt states it did feel little funny when he made the urine.  Pt states he is on thinner. Pt denies any hx of this in past.

## 2023-08-23 NOTE — ED Provider Notes (Addendum)
 Shared visit  Patient presented to the emergency department following an episode of hematuria.  Patient is on antiplatelet agent and anticoagulation.  Does endorse some burning with urination.  No trauma.  Able to urinate.  Did not go to dialysis today secondary to hematuria.  No significant flank pain.  Plan for UA, will check lab work including a CBC and a BMP to evaluate for hyperkalemia and anemia.  Lab work with anemia but hemoglobin is stable at 10.4 from a most recent hemoglobin of 10.3.  Appears normocytic and likely in the setting of ESRD.  Platelets within normal limits.  Creatinine is at his baseline.  Glucose low at 67 and was given p.o.  Potassium low at 3.0, not repleted given that he is dialysis patient.  Do not feel that he needs emergent dialysis.  Plan to do dialysis session on Wednesday.  UA without obvious findings of a urinary tract infection.  Will send for culture.  Will give urology follow-up for further workup of painless hematuria.  Discussed return precautions.   Suzanne Kirsch, MD 08/23/23 1052    Suzanne Kirsch, MD 08/23/23 1154    Suzanne Kirsch, MD 08/23/23 1320

## 2023-08-23 NOTE — ED Notes (Signed)
 See triage note  Presents with family  States he noticed some blood in his urine this am while getting ready for dialysis  Denies any pain

## 2023-08-23 NOTE — Discharge Instructions (Addendum)
 You were evaluated in the ED for blood in your urine.  Your lab work is consistent and shows no alarming indications for any interventions at this this time. Your urine shows blood in which we would like for you to schedule a follow up appointment with urology for further evaluation. Call Dr. Jacqueline office today to schedule an appointment.  A urine culture is pending. Continue to go to your dialysis appointment on Wednesday.

## 2023-08-24 ENCOUNTER — Ambulatory Visit: Admitting: Urology

## 2023-08-24 ENCOUNTER — Encounter: Payer: Self-pay | Admitting: Urology

## 2023-08-24 VITALS — BP 138/65 | HR 77 | Ht 77.0 in | Wt 239.0 lb

## 2023-08-24 DIAGNOSIS — R310 Gross hematuria: Secondary | ICD-10-CM | POA: Diagnosis not present

## 2023-08-24 DIAGNOSIS — N401 Enlarged prostate with lower urinary tract symptoms: Secondary | ICD-10-CM | POA: Diagnosis not present

## 2023-08-24 LAB — URINALYSIS, COMPLETE
Bilirubin, UA: NEGATIVE
Glucose, UA: NEGATIVE
Ketones, UA: NEGATIVE
Leukocytes,UA: NEGATIVE
Nitrite, UA: NEGATIVE
Protein,UA: NEGATIVE
Specific Gravity, UA: 1.01 (ref 1.005–1.030)
Urobilinogen, Ur: 0.2 mg/dL (ref 0.2–1.0)
pH, UA: 6 (ref 5.0–7.5)

## 2023-08-24 LAB — MICROSCOPIC EXAMINATION

## 2023-08-24 NOTE — Progress Notes (Signed)
 08/24/2023 10:42 AM   Jeffery Joseph [birth year] [id number]  Referring provider: Epifanio Alm SQUIBB, MD [address] [place],  [place] [address]  Chief Complaint  Patient presents with   Follow-up   Hematuria    Urologic history:  1.  BPH with LUTS Combination therapy tamsulosin /finasteride    HPI: Jeffery Joseph is a 83 y.o. male presents for follow-up.  Was due for annual follow-up however yesterday he had onset of total gross painless hematuria.  Urine described as bright red which lasted 3-4 hours then resolved Was seen in the ED with stable hemoglobin.  No imaging was performed Denied flank, abdominal or pelvic pain.  No significant dysuria or LUTS. On Eliquis  and Plavix  Remains on tamsulosin  for BPH Recently started dialysis  PMH: Past Medical History:  Diagnosis Date   Anxiety    Arthritis    Depression    Diabetes mellitus without complication (HCC)    Hypertension    Pneumonia    Renal disorder     Surgical History: Past Surgical History:  Procedure Laterality Date   A/V FISTULAGRAM Left 06/08/2023   Procedure: A/V Fistulagram;  Surgeon: Jama Cordella MATSU, MD;  Location: [facility] INVASIVE CV LAB;  Service: Cardiovascular;  Laterality: Left;   APPLICATION OF WOUND VAC Right 11/14/2021   Procedure: APPLICATION OF WOUND VAC;  Surgeon: Jama Cordella MATSU, MD;  Location: [facility] ORS;  Service: Vascular;  Laterality: Right;   AV FISTULA PLACEMENT Left 11/14/2021   Procedure: ARTERIOVENOUS (AV) FISTULA CREATION ( BRACHIAL CEPHALIC);  Surgeon: Jama Cordella MATSU, MD;  Location: [facility] ORS;  Service: Vascular;  Laterality: Left;    Home Medications:  Allergies as of 08/24/2023   No Known Allergies      Medication List        Accurate as of August 24, 2023 10:42 AM. If you have any questions, ask your nurse or doctor.          acetaminophen  325 MG tablet Commonly known as: TYLENOL  Take 650 mg by mouth every 6 (six) hours as needed.   ALPRAZolam  0.5 MG  tablet Commonly known as: XANAX  Take 0.5 mg by mouth 3 (three) times daily as needed for sleep.   amiodarone  200 MG tablet Commonly known as: PACERONE  Take 2 tablets (400 mg total) by mouth 2 (two) times daily for 7 days, THEN 1 tablet (200 mg total) daily. Start taking on: July 24, 2023   apixaban  2.5 MG Tabs tablet Commonly known as: ELIQUIS  Take 1 tablet (2.5 mg total) by mouth 2 (two) times daily.   Cholecalciferol  125 MCG (5000 UT) Tabs Take 5,000 Units by mouth daily.   clopidogrel  75 MG tablet Commonly known as: Plavix  Take 1 tablet (75 mg total) by mouth daily.   diclofenac  Sodium 1 % Gel Commonly known as: VOLTAREN  Apply 2 g topically 4 (four) times daily.   donepezil  10 MG tablet Commonly known as: ARICEPT  Take 10 mg by mouth at bedtime.   DSS 100 MG Caps Take 100 mg by mouth at bedtime.   enalapril  20 MG tablet Commonly known as: VASOTEC  Take 20 mg by mouth 2 (two) times daily.   finasteride  5 MG tablet Commonly known as: PROSCAR  Take 1 tablet (5 mg total) by mouth daily.   furosemide  40 MG tablet Commonly known as: LASIX  Take 1 tablet (40 mg total) by mouth every other day.   glipiZIDE  5 MG 24 hr tablet Commonly known as: GLUCOTROL  XL Take by mouth.   lidocaine -prilocaine cream Commonly  known as: EMLA Apply 1 Application topically as needed (Dialysis).   Lokelma 10 g Pack packet Generic drug: sodium zirconium cyclosilicate Take 1 packet by mouth daily.   metoprolol  succinate 25 MG 24 hr tablet Commonly known as: TOPROL -XL Take 0.5 tablets (12.5 mg total) by mouth daily.   sodium bicarbonate  650 MG tablet Take 650 mg by mouth 2 (two) times daily.   tamsulosin  0.4 MG Caps capsule Commonly known as: FLOMAX  Take 1 capsule (0.4 mg total) by mouth daily.        Allergies: No Known Allergies  Family History: No family history on file.  Social History:  reports that he has never smoked. He has never used smokeless tobacco. He reports that  he does not currently use drugs. He reports that he does not drink alcohol.   Physical Exam: BP 138/65 (BP Location: Left Arm, Patient Position: Sitting, Cuff Size: Normal)   Pulse 77   Ht 6' 5 (1.956 m)   Wt 239 lb (108.4 kg)   SpO2 100%   BMI 28.34 kg/m   Constitutional:  Alert and oriented, No acute distress. HEENT: [place] AT Respiratory: Normal respiratory effort, no increased work of breathing. Psychiatric: Normal mood and affect.  Laboratory Data:  Urinalysis Dipstick trace blood/microscopy 3-10 RBC   Assessment & Plan:    1. Gross hematuria Resolved; UA today with microhematuria CT abdomen pelvis without contrast ordered Schedule cystoscopy for lower tract evaluation  2.  BPH with LUTS Stable on tamsulosin    Jeffery JAYSON Barba, MD  [facility] [address] [place], [place] [address] [phone]

## 2023-08-24 NOTE — Patient Instructions (Signed)
 Please call scheduling for CT Scan @ [phone]

## 2023-08-25 ENCOUNTER — Ambulatory Visit: Admitting: Urology

## 2023-08-26 LAB — URINE CULTURE: Culture: 30000 — AB

## 2023-09-07 ENCOUNTER — Ambulatory Visit
Admission: RE | Admit: 2023-09-07 | Discharge: 2023-09-07 | Disposition: A | Discharge status: Home / Self Care | Source: Ambulatory Visit | Attending: Urology | Admitting: Urology

## 2023-09-07 DIAGNOSIS — R310 Gross hematuria: Secondary | ICD-10-CM | POA: Insufficient documentation

## 2023-09-07 MED ORDER — IOHEXOL 300 MG/ML  SOLN
125.0000 mL | Freq: Once | INTRAMUSCULAR | Status: AC | PRN
  Administered 2023-09-07: 125 mL via INTRAVENOUS

## 2023-09-07 MED ORDER — SODIUM CHLORIDE 0.9 % IV SOLN: INTRAVENOUS | Status: DC

## 2023-09-28 ENCOUNTER — Telehealth (INDEPENDENT_AMBULATORY_CARE_PROVIDER_SITE_OTHER): Payer: Self-pay

## 2023-09-28 NOTE — Telephone Encounter (Signed)
 Spoke with the patient's daughter and he is scheduled with Dr. Marea for a left arm declot on 09/29/23 with a 6:45 am arrival time to the [facility]. Pre-procedure instructions were discussed and will be sent to Mychart.

## 2023-09-29 ENCOUNTER — Other Ambulatory Visit: Payer: Self-pay

## 2023-09-29 ENCOUNTER — Encounter
Admission: RE | Disposition: A | Discharge status: Home / Self Care | Payer: Self-pay | Source: Home / Self Care | Attending: Vascular Surgery

## 2023-09-29 ENCOUNTER — Encounter: Payer: Self-pay | Admitting: Vascular Surgery

## 2023-09-29 ENCOUNTER — Ambulatory Visit
Admission: RE | Admit: 2023-09-29 | Discharge: 2023-09-29 | Disposition: A | Discharge status: Home / Self Care | Attending: Vascular Surgery | Admitting: Vascular Surgery

## 2023-09-29 DIAGNOSIS — Z992 Dependence on renal dialysis: Secondary | ICD-10-CM | POA: Diagnosis not present

## 2023-09-29 DIAGNOSIS — Z9889 Other specified postprocedural states: Secondary | ICD-10-CM

## 2023-09-29 DIAGNOSIS — E1122 Type 2 diabetes mellitus with diabetic chronic kidney disease: Secondary | ICD-10-CM | POA: Insufficient documentation

## 2023-09-29 DIAGNOSIS — I120 Hypertensive chronic kidney disease with stage 5 chronic kidney disease or end stage renal disease: Secondary | ICD-10-CM | POA: Diagnosis not present

## 2023-09-29 DIAGNOSIS — Y832 Surgical operation with anastomosis, bypass or graft as the cause of abnormal reaction of the patient, or of later complication, without mention of misadventure at the time of the procedure: Secondary | ICD-10-CM | POA: Insufficient documentation

## 2023-09-29 DIAGNOSIS — T82858A Stenosis of vascular prosthetic devices, implants and grafts, initial encounter: Secondary | ICD-10-CM

## 2023-09-29 DIAGNOSIS — N186 End stage renal disease: Secondary | ICD-10-CM

## 2023-09-29 HISTORY — PX: A/V SHUNT INTERVENTION: CATH118220

## 2023-09-29 LAB — GLUCOSE, CAPILLARY: Glucose-Capillary: 83 mg/dL (ref 70–99)

## 2023-09-29 MED ORDER — HEPARIN SODIUM (PORCINE) 1000 UNIT/ML IJ SOLN
INTRAMUSCULAR | Status: DC | PRN
  Administered 2023-09-29: 3000 [IU] via INTRAVENOUS

## 2023-09-29 MED ORDER — HEPARIN (PORCINE) IN NACL 1000-0.9 UT/500ML-% IV SOLN
INTRAVENOUS | Status: DC | PRN
  Administered 2023-09-29: 500 mL

## 2023-09-29 MED ORDER — FENTANYL CITRATE (PF) 100 MCG/2ML IJ SOLN
INTRAMUSCULAR | Status: DC | PRN
  Administered 2023-09-29: 50 ug via INTRAVENOUS

## 2023-09-29 MED ORDER — MIDAZOLAM HCL 2 MG/2ML IJ SOLN
INTRAMUSCULAR | Status: AC
  Filled 2023-09-29: qty 2

## 2023-09-29 MED ORDER — LIDOCAINE-EPINEPHRINE (PF) 1 %-1:200000 IJ SOLN
INTRAMUSCULAR | Status: DC | PRN
  Administered 2023-09-29: 10 mL

## 2023-09-29 MED ORDER — HYDROMORPHONE HCL 1 MG/ML IJ SOLN: 1.0000 mg | Freq: Once | INTRAMUSCULAR | Status: DC | PRN

## 2023-09-29 MED ORDER — CEFAZOLIN SODIUM-DEXTROSE 1-4 GM/50ML-% IV SOLN
1.0000 g | INTRAVENOUS | Status: AC
  Administered 2023-09-29: 1 g via INTRAVENOUS

## 2023-09-29 MED ORDER — IODIXANOL 320 MG/ML IV SOLN
INTRAVENOUS | Status: DC | PRN
  Administered 2023-09-29: 20 mL

## 2023-09-29 MED ORDER — MIDAZOLAM HCL 2 MG/2ML IJ SOLN
INTRAMUSCULAR | Status: DC | PRN
  Administered 2023-09-29: 1 mg via INTRAVENOUS

## 2023-09-29 MED ORDER — DIPHENHYDRAMINE HCL 50 MG/ML IJ SOLN: 50.0000 mg | Freq: Once | INTRAMUSCULAR | Status: DC | PRN

## 2023-09-29 MED ORDER — SODIUM CHLORIDE 0.9 % IV SOLN: INTRAVENOUS | Status: DC

## 2023-09-29 MED ORDER — CEFAZOLIN SODIUM-DEXTROSE 1-4 GM/50ML-% IV SOLN
INTRAVENOUS | Status: AC
Start: 2023-09-29 — End: 2023-09-29
  Filled 2023-09-29: qty 50

## 2023-09-29 MED ORDER — METHYLPREDNISOLONE SODIUM SUCC 125 MG IJ SOLR: 125.0000 mg | Freq: Once | INTRAMUSCULAR | Status: DC | PRN

## 2023-09-29 MED ORDER — MIDAZOLAM HCL 2 MG/ML PO SYRP: 8.0000 mg | ORAL_SOLUTION | Freq: Once | ORAL | Status: DC | PRN

## 2023-09-29 MED ORDER — HEPARIN SODIUM (PORCINE) 1000 UNIT/ML IJ SOLN
INTRAMUSCULAR | Status: AC
  Filled 2023-09-29: qty 10

## 2023-09-29 MED ORDER — ONDANSETRON HCL 4 MG/2ML IJ SOLN: 4.0000 mg | Freq: Four times a day (QID) | INTRAMUSCULAR | Status: DC | PRN

## 2023-09-29 MED ORDER — FAMOTIDINE 20 MG PO TABS: 40.0000 mg | ORAL_TABLET | Freq: Once | ORAL | Status: DC | PRN

## 2023-09-29 MED ORDER — FENTANYL CITRATE PF 50 MCG/ML IJ SOSY
PREFILLED_SYRINGE | INTRAMUSCULAR | Status: AC
  Filled 2023-09-29: qty 1

## 2023-09-29 NOTE — Op Note (Signed)
 [facility]    OPERATIVE NOTE   PROCEDURE: 1.   Left brachiocephalic arteriovenous fistula cannulation under ultrasound guidance 2.   Left arm fistulagram including central venogram 3.   Percutaneous transluminal angioplasty of mid upper arm cephalic vein stenosis with 7 mm diameter Lutonix drug-coated angioplasty balloon 4.   Stent placement to the mid upper arm cephalic vein for residual stenosis after angioplasty with 8 mm diameter by 2.5 cm length Viabahn stent  PRE-OPERATIVE DIAGNOSIS: 1. ESRD 2. Poorly functional left brachiocephalic AVF  POST-OPERATIVE DIAGNOSIS: same as above   SURGEON: Selinda Gu, MD  ANESTHESIA: local with MCS  ESTIMATED BLOOD LOSS: 3 cc  FINDING(S): Moderate stenosis just prior to the previously placed stents in the mid upper arm cephalic vein up about 65 to 70%.  The stents themselves were patent and the remainder of the fistula in the central venous circulation was patent.  SPECIMEN(S):  None  CONTRAST: 20 cc  FLUORO TIME: 1.8 minutes  MODERATE CONSCIOUS SEDATION TIME: Approximately 19 minutes with 1 mg of Versed  and 50 mcg of Fentanyl    INDICATIONS: GERHARD Joseph is a 83 y.o. male who presents with malfunctioning left brachiocephalic arteriovenous fistula.  The patient is scheduled for left arm fistulagram.  The patient is aware the risks include but are not limited to: bleeding, infection, thrombosis of the cannulated access, and possible anaphylactic reaction to the contrast.  The patient is aware of the risks of the procedure and elects to proceed forward.  DESCRIPTION: After full informed written consent was obtained, the patient was brought back to the angiography suite and placed supine upon the angiography table.  The patient was connected to monitoring equipment. Moderate conscious sedation was administered with a face to face encounter with the patient throughout the procedure with my supervision of the RN  administering medicines and monitoring the patient's vital signs and mental status throughout from the start of the procedure until the patient was taken to the recovery room. The left arm was prepped and draped in the standard fashion for a percutaneous access intervention.  Under ultrasound guidance, the left brachiocephalic arteriovenous fistula was cannulated with a micropuncture needle under direct ultrasound guidance where it was patent and a permanent image was performed.  The microwire was advanced into the fistula and the needle was exchanged for the a microsheath.  I then upsized to a 6 Fr Sheath and imaging was performed.  Hand injections were completed to image the access including the central venous system. This demonstrated moderate stenosis just prior to the previously placed stents in the mid upper arm cephalic vein up about 65 to 70%.  The stents themselves were patent and the remainder of the fistula in the central venous circulation was patent.  Based on the images, this patient will need intervention to the stenosis. I then gave the patient 3000 units of intravenous heparin .  I then crossed the stenosis with an supra core wire.  Based on the imaging, a 7 mm x 6 cm Lutonix drug-coated angioplasty balloon was selected.  The balloon was centered around the mid upper arm cephalic vein stenosis and inflated to 12 ATM for 1 minute(s).  On completion imaging, a 50-60% residual stenosis was present with minimal improvement with angioplasty.  I then upsized to a 7 [place] sheath and exchanged for a 0.018 wire.  I selected an 8 mm diameter by 2.5 cm length Viabahn stent and deployed this across the stenosis and postdilated with 7 mm  balloon with excellent angiograph completion result only about a 10% residual stenosis.     Based on the completion imaging, no further intervention is necessary.  The wire and balloon were removed from the sheath.  A 4-0 Monocryl purse-string suture was sewn around the  sheath.  The sheath was removed while tying down the suture.  A sterile bandage was applied to the puncture site.  COMPLICATIONS: None  CONDITION: Stable   Selinda Gu  09/29/2023 8:50 AM   This note was created with Dragon Medical transcription system. Any errors in dictation are purely unintentional.

## 2023-09-29 NOTE — H&P (Signed)
 [facility] Admission History & Physical  MRN : [id number]  Jeffery Joseph is a 83 y.o. (06/28/1940) male who presents with chief complaint of No chief complaint on file. [name]  History of Present Illness: I am asked to evaluate the patient by the dialysis center. The patient was sent here because they were unable to achieve adequate dialysis this morning. Furthermore the Center states there is very poor thrill and bruit. The patient states there there have been increasing problems with the access, such as pulling clots during dialysis and prolonged bleeding after decannulation. The patient estimates these problems have been going on for several weeks. The patient is unaware of any other change.   Patient denies pain or tenderness overlying the access.  There is no pain with dialysis.  The patient denies hand pain or finger pain consistent with steal syndrome.    There have been past interventions or declots of this access.  The patient is not chronically hypotensive on dialysis.  Current Facility-Administered Medications  Medication Dose Route Frequency Provider Last Rate Last Admin   0.9 %  sodium chloride  infusion   Intravenous Continuous Brown, Fallon E, NP 10 mL/hr at 09/29/23 0747 New Bag at 09/29/23 0747   ceFAZolin  (ANCEF ) IVPB 1 g/50 mL premix  1 g Intravenous 30 min Pre-Op  Brown, Fallon E, NP       diphenhydrAMINE  (BENADRYL ) injection 50 mg  50 mg Intravenous Once PRN Brown, Fallon E, NP       famotidine  (PEPCID ) tablet 40 mg  40 mg Oral Once PRN Brown, Fallon E, NP       HYDROmorphone  (DILAUDID ) injection 1 mg  1 mg Intravenous Once PRN Brown, Fallon E, NP       methylPREDNISolone  sodium succinate (SOLU-MEDROL ) 125 mg/2 mL injection 125 mg  125 mg Intravenous Once PRN Brown, Fallon E, NP       midazolam  (VERSED ) 2 MG/ML syrup 8 mg  8 mg Oral Once PRN Brown, Fallon E, NP       ondansetron  (ZOFRAN ) injection 4 mg  4 mg Intravenous Q6H PRN Brown, Fallon E, NP         Past Medical History:  Diagnosis Date   Anxiety    Arthritis    Depression    Diabetes mellitus without complication (HCC)    Hypertension    Pneumonia    Renal disorder     Past Surgical History:  Procedure Laterality Date   A/V FISTULAGRAM Left 06/08/2023   Procedure: A/V Fistulagram;  Surgeon: Jama Cordella MATSU, MD;  Location: [facility];  Service: Cardiovascular;  Laterality: Left;   APPLICATION OF WOUND VAC Right 11/14/2021   Procedure: APPLICATION OF WOUND VAC;  Surgeon: Jama Cordella MATSU, MD;  Location: [facility] ORS;  Service: Vascular;  Laterality: Right;   AV FISTULA PLACEMENT Left 11/14/2021   Procedure: ARTERIOVENOUS (AV) FISTULA CREATION ( BRACHIAL CEPHALIC);  Surgeon: Jama Cordella MATSU, MD;  Location: [facility] ORS;  Service: Vascular;  Laterality: Left;     Social History   Tobacco Use   Smoking status: Never   Smokeless tobacco: Never  Substance Use Topics   Alcohol use: No   Drug use: Not Currently     History reviewed. No pertinent family history.  No family history of bleeding or clotting disorders, autoimmune disease or porphyria  No Known Allergies   REVIEW OF SYSTEMS (Negative unless checked)  Constitutional: [] Weight loss  [] Fever  [] Chills Cardiac: [] Chest pain   [] Chest pressure   []   Palpitations   [] Shortness of breath when laying flat   [] Shortness of breath at rest   [x] Shortness of breath with exertion. Vascular:  [] Pain in legs with walking   [] Pain in legs at rest   [] Pain in legs when laying flat   [] Claudication   [] Pain in feet when walking  [] Pain in feet at rest  [] Pain in feet when laying flat   [] History of DVT   [] Phlebitis   [] Swelling in legs   [] Varicose veins   [] Non-healing ulcers Pulmonary:   [] Uses home oxygen   [] Productive cough   [] Hemoptysis   [] Wheeze  [] COPD   [] Asthma Neurologic:  [] Dizziness  [] Blackouts   [] Seizures   [] History of stroke   [] History of TIA  [] Aphasia   [] Temporary blindness   [] Dysphagia    [] Weakness or numbness in arms   [] Weakness or numbness in legs Musculoskeletal:  [] Arthritis   [] Joint swelling   [] Joint pain   [] Low back pain Hematologic:  [] Easy bruising  [] Easy bleeding   [] Hypercoagulable state   [x] Anemic  [] Hepatitis Gastrointestinal:  [] Blood in stool   [] Vomiting blood  [] Gastroesophageal reflux/heartburn   [] Difficulty swallowing. Genitourinary:  [x] Chronic kidney disease   [] Difficult urination  [] Frequent urination  [] Burning with urination   [] Blood in urine Skin:  [] Rashes   [] Ulcers   [] Wounds Psychological:  [x] History of anxiety   []  History of major depression.  Physical Examination  Vitals:   09/29/23 0726  BP: 134/66  Pulse: 76  Resp: 16  Temp: 97.9 F (36.6 C)  TempSrc: Temporal  SpO2: 94%   There is no height or weight on file to calculate BMI. Gen: WD/WN, NAD Head: [place]/AT, No temporalis wasting.  Ear/Nose/Throat: Hearing grossly intact, nares w/o erythema or drainage, oropharynx w/o Erythema/Exudate,  Eyes: Conjunctiva clear, sclera non-icteric Neck: Trachea midline.  No JVD.  Pulmonary:  Good air movement, respirations not labored, no use of accessory muscles.  Cardiac: RRR, normal S1, S2. Vascular: pulsatile left arm AVF Vessel Right Left  Radial Palpable Palpable   Musculoskeletal: M/S 5/5 throughout.  Extremities without ischemic changes.  No deformity or atrophy.  Neurologic: Sensation grossly intact in extremities.  Symmetrical.  Speech is fluent. Motor exam as listed above. Psychiatric: Judgment intact, Mood & affect appropriate for pt's clinical situation. Dermatologic: No rashes or ulcers noted.  No cellulitis or open wounds.    CBC Lab Results  Component Value Date   WBC 4.7 08/23/2023   HGB 10.4 (L) 08/23/2023   HCT 34.2 (L) 08/23/2023   MCV 83.0 08/23/2023   PLT 206 08/23/2023    BMET    Component Value Date/Time   NA 141 08/23/2023 1050   NA 139 05/06/2012 0843   K 3.0 (L) 08/23/2023 1050   K 4.1 05/06/2012  0843   CL 102 08/23/2023 1050   CL 106 05/06/2012 0843   CO2 29 08/23/2023 1050   CO2 26 05/06/2012 0843   GLUCOSE 67 (L) 08/23/2023 1050   GLUCOSE 112 (H) 05/06/2012 0843   BUN 55 (H) 08/23/2023 1050   BUN 33 (H) 05/06/2012 0843   CREATININE 3.63 (H) 08/23/2023 1050   CREATININE 2.09 (H) 05/06/2012 0843   CALCIUM 9.3 08/23/2023 1050   CALCIUM 9.5 05/06/2012 0843   GFRNONAA 16 (L) 08/23/2023 1050   GFRNONAA 31 (L) 05/06/2012 0843   GFRAA 32 (L) 03/18/2015 0341   GFRAA 36 (L) 05/06/2012 0843   CrCl cannot be calculated (Patient's most recent lab result is older than  the maximum 21 days allowed.).  COAG Lab Results  Component Value Date   INR 1.3 (H) 07/17/2023    Radiology CT ABDOMEN PELVIS W WO CONTRAST Result Date: 09/09/2023 CLINICAL DATA:  Gross/macroscopic hematuria. Painless. Microscopic hematuria. EXAM: CT ABDOMEN AND PELVIS WITHOUT AND WITH CONTRAST TECHNIQUE: Multidetector CT imaging of the abdomen and pelvis was performed following the standard protocol before and following the bolus administration of intravenous contrast. RADIATION DOSE REDUCTION: This exam was performed according to the departmental dose-optimization program which includes automated exposure control, adjustment of the mA and/or kV according to patient size and/or use of iterative reconstruction technique. CONTRAST:  OMNIPAQUE  IOHEXOL  300 MG/ML  SOLN COMPARISON:  Multiple priors including CT March 18, 2015 FINDINGS: Lower chest: Bibasilar atelectasis versus scarring. Hepatobiliary: 12 mm hypoenhancing focus in the medial right lobe of the liver on image 14/9 is not confidently identified on noncontrast enhanced portion of the examination. Gallbladder is unremarkable. No biliary ductal dilation. Pancreas: No pancreatic ductal dilation or evidence of acute inflammation. Spleen: No splenomegaly. Adrenals/Urinary Tract: No suspicious adrenal nodule/mass. No hydronephrosis.  No renal, ureteral or bladder  calculi. Innumerable bilateral renal lesions of varying degrees of complexity a few of which demonstrate questionable postcontrast enhancement for instance the intrinsically hyperdense 19 mm anterior right interpolar renal lesion on image 39/2 others of which demonstrate complexity and questionable internal enhancement for instance a 16 mm lesion in the posterior right interpolar kidney on image 34/9. There are bilateral renal lesions which do not demonstrate enhancement some of which are intrinsically hyperdense others of which are fluid density or less compatible with a combination of hemorrhagic/proteinaceous and fluid density renal cysts. There are other lesions which are technically too small to accurately characterize. Urinary bladder is unremarkable for degree of distension. Stomach/Bowel: Stomach is unremarkable for degree of distension. No pathologic dilation of small or large bowel. No evidence of acute bowel inflammation. Vascular/Lymphatic: Aortic atherosclerosis. Smooth IVC contours. The portal, splenic and superior mesenteric veins are patent. No pathologically enlarged abdominal or pelvic lymph nodes. Reproductive: Heterogeneous enhancement of an enlarged prostate gland with median lobe hypertrophy. Other: Fat containing right inguinal hernia. Musculoskeletal: Advanced L5-S1 discogenic disease. Multilevel degenerative changes spine. Degenerative change of the bilateral hips. IMPRESSION: 1. Innumerable bilateral renal lesions of varying degrees of complexity a few of which demonstrate questionable postcontrast enhancement and others of which demonstrate complexity and are not definitively characterize. Recommend further evaluation with renal protocol MRI with and without contrast. 2. No hydronephrosis. No renal, ureteral or bladder calculi. 3. Heterogeneous enhancement of an enlarged prostate gland with median lobe hypertrophy. 4. 12 mm hypoenhancing focus in the medial right lobe of the liver is not  confidently identified on noncontrast enhanced portion of the examination. Suggest attention on follow-up MRI with and without contrast. 5.  Aortic Atherosclerosis (ICD10-I70.0). Electronically Signed   By: Reyes Holder M.D.   On: 09/09/2023 18:01    Assessment/Plan 1.  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 The patient will undergo angiography and correction of any problems using interventional techniques with the hope of restoring function to the access.  The risks and benefits were described to the patient.  All questions were answered.  The patient agrees to proceed with angiography and intervention. Potassium will be drawn to ensure that it is an appropriate level prior to performing intervention. 2.  End-stage renal disease requiring hemodialysis:  Patient will continue dialysis therapy without further interruption if a  successful intervention is not achieved then a tunneled catheter will be placed. Dialysis has already been arranged. 3.  Hypertension:  Patient will continue medical management; nephrology is following no changes in oral medications. 4. Diabetes mellitus:  Glucose will be monitored and oral medications been held this morning once the patient has undergone the patient's procedure po intake will be reinitiated and again Accu-Cheks will be used to assess the blood glucose level and treat as needed. The patient will be restarted on the patient's usual hypoglycemic regime     Selinda Gu, MD  09/29/2023 7:59 AM

## 2023-09-29 NOTE — Discharge Instructions (Signed)
 Shuntogram, Care After Refer to this sheet in the next few weeks. These instructions provide you with information on caring for yourself after your procedure. Your health care provider may also give you more specific instructions. Your treatment has been planned according to current medical practices, but problems sometimes occur. Call your health care provider if you have any problems or questions after your procedure. What can I expect after the procedure? After your procedure, it is typical to have the following:  A small amount of discomfort in the area where the catheters were placed.  A small amount of bruising around the fistula.  Sleepiness and fatigue.  Follow these instructions at home:  Rest at home for the day following your procedure.  Do not drive or operate heavy machinery while taking pain medicine.  Take medicines only as directed by your health care provider.  Do not take baths, swim, or use a hot tub until your health care provider approves. You may shower 24 hours after the procedure or as directed by your health care provider.  There are many different ways to close and cover an incision, including stitches, skin glue, and adhesive strips. Follow your health care provider's instructions on: ? Incision care. ? Bandage (dressing) changes and removal. ? Incision closure removal.  Monitor your dialysis fistula carefully. Contact a health care provider if:  You have drainage, redness, swelling, or pain at your catheter site.  You have a fever.  You have chills. Get help right away if:  You feel weak.  You have trouble balancing.  You have trouble moving your arms or legs.  You have problems with your speech or vision.  You can no longer feel a vibration or buzz when you put your fingers over your dialysis fistula.  The limb that was used for the procedure: ? Swells. ? Is painful. ? Is cold. ? Is discolored, such as blue or pale white. This  information is not intended to replace advice given to you by your health care provider. Make sure you discuss any questions you have with your health care provider. Document Released: 05/15/2013 Document Revised: 06/06/2015 Document Reviewed: 02/17/2013 Elsevier Interactive Patient Education  2018 [employer].

## 2023-10-04 LAB — POTASSIUM (ARMC VASCULAR LAB ONLY): Potassium (ARMC vascular lab): 4.9 mmol/L (ref 3.5–5.1)

## 2023-10-05 ENCOUNTER — Encounter: Payer: Self-pay | Admitting: Urology

## 2023-10-05 ENCOUNTER — Ambulatory Visit: Admitting: Urology

## 2023-10-05 VITALS — BP 115/68 | HR 70 | Ht 77.0 in | Wt 232.0 lb

## 2023-10-05 DIAGNOSIS — R310 Gross hematuria: Secondary | ICD-10-CM

## 2023-10-05 NOTE — Progress Notes (Signed)
   10/05/23  CC:  Chief Complaint  Patient presents with   Cysto    HPI: Refer to my prior note 08/24/2023.  No recurrent gross hematuria.  CTU performed 09/07/2023 showed multiple bilateral renal lesions few with questionable postcontrast enhancement.  BPH noted.  CT was reviewed and prostate volume calculated 129 cc  Blood pressure 115/68, pulse 70, height 6' 5 (1.956 m), weight 232 lb (105.2 kg). NED. A&Ox3.   No respiratory distress   Abd soft, NT, ND Normal phallus with bilateral descended testicles  Cystoscopy Procedure Note  Patient identification was confirmed, informed consent was obtained, and patient was prepped using Betadine solution.  Lidocaine  jelly was administered per urethral meatus.     Pre-Procedure: - Inspection reveals a normal caliber urethral meatus.  Procedure: The flexible cystoscope was introduced without difficulty - No urethral strictures/lesions are present. - Prominent lateral lobe enlargement prostate with hypervascularity - Occlusive median lobe bladder neck - Bilateral ureteral orifices not identified - Bladder mucosa  reveals no ulcers, tumors, or lesions - No bladder stones - Moderate trabeculation  Retroflexion shows intravesical median and lateral lobe extension   Post-Procedure: - Patient tolerated the procedure well  Assessment/ Plan: Hematuria most likely secondary to BPH Radiology had recommended renal mass protocol MRI for evaluation of his renal cyst however patient and daughter have declined further evaluation   [name], MD

## 2023-11-01 ENCOUNTER — Other Ambulatory Visit (INDEPENDENT_AMBULATORY_CARE_PROVIDER_SITE_OTHER): Payer: Self-pay | Admitting: Vascular Surgery

## 2023-11-01 DIAGNOSIS — N186 End stage renal disease: Secondary | ICD-10-CM

## 2023-11-02 ENCOUNTER — Ambulatory Visit (INDEPENDENT_AMBULATORY_CARE_PROVIDER_SITE_OTHER): Admitting: Vascular Surgery

## 2023-11-02 ENCOUNTER — Other Ambulatory Visit (INDEPENDENT_AMBULATORY_CARE_PROVIDER_SITE_OTHER)

## 2023-11-02 ENCOUNTER — Encounter (INDEPENDENT_AMBULATORY_CARE_PROVIDER_SITE_OTHER): Payer: Self-pay | Admitting: Vascular Surgery

## 2023-11-02 VITALS — BP 112/56 | HR 79 | Resp 18 | Wt 227.2 lb

## 2023-11-02 DIAGNOSIS — E1122 Type 2 diabetes mellitus with diabetic chronic kidney disease: Secondary | ICD-10-CM

## 2023-11-02 DIAGNOSIS — N186 End stage renal disease: Secondary | ICD-10-CM

## 2023-11-02 DIAGNOSIS — Z992 Dependence on renal dialysis: Secondary | ICD-10-CM

## 2023-11-02 DIAGNOSIS — I10 Essential (primary) hypertension: Secondary | ICD-10-CM | POA: Diagnosis not present

## 2023-11-02 NOTE — Assessment & Plan Note (Signed)
 blood glucose control important in reducing the progression of atherosclerotic disease. Also, involved in wound healing. On appropriate medications.

## 2023-11-02 NOTE — Assessment & Plan Note (Signed)
 blood pressure control important in reducing the progression of atherosclerotic disease. On appropriate oral medications.

## 2023-11-02 NOTE — Assessment & Plan Note (Signed)
 His duplex today shows a patent left brachiocephalic AV fistula without focal stenosis after intervention.  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  This is not currently that bothersome to him.  Fistula has had multiple previous interventions but appears to have good flow without any focal issues today.  The aneurysm is not really bothering him much and as long as we avoid repetitive punctures in the same area, this is unlikely to become a larger issue.  Plan to follow-up in 6 months with duplex or sooner if problems develop in the interim.

## 2023-11-02 NOTE — Progress Notes (Signed)
 MRN : [id number]  Jeffery Joseph is a 83 y.o. (02/17/1940) male who presents with chief complaint of  Chief Complaint  Patient presents with   Follow-up    [facility] 5 week with HDA  .  History of Present Illness: Patient returns today in follow up of his dialysis access.  He underwent intervention for this a little over a month ago.  It has been working well for dialysis since that time.  In general, they have not had any major issues with prolonged bleeding, difficulty with access, or low flow rate.  His duplex today shows a patent left brachiocephalic AV fistula without focal stenosis after intervention.  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  This is not currently that bothersome to him.  They seem to be doing a reasonably good job of rotating his access sites.  Current Outpatient Medications  Medication Sig Dispense Refill   acetaminophen  (TYLENOL ) 325 MG tablet Take 650 mg by mouth every 6 (six) hours as needed.     ALPRAZolam  (XANAX ) 0.5 MG tablet Take 0.5 mg by mouth 3 (three) times daily as needed for sleep.     amiodarone  (PACERONE ) 200 MG tablet Take 2 tablets (400 mg total) by mouth 2 (two) times daily for 7 days, THEN 1 tablet (200 mg total) daily. 58 tablet 0   apixaban  (ELIQUIS ) 2.5 MG TABS tablet Take 1 tablet (2.5 mg total) by mouth 2 (two) times daily. 60 tablet 0   Cholecalciferol  125 MCG (5000 UT) TABS Take 5,000 Units by mouth daily.     clopidogrel  (PLAVIX ) 75 MG tablet Take 1 tablet (75 mg total) by mouth daily. 30 tablet 11   diclofenac  Sodium (VOLTAREN ) 1 % GEL Apply 2 g topically 4 (four) times daily. 100 g 0   Docusate Sodium  (DSS) 100 MG CAPS Take 100 mg by mouth at bedtime.     donepezil  (ARICEPT ) 10 MG tablet Take 10 mg by mouth at bedtime.     enalapril  (VASOTEC ) 20 MG tablet Take 20 mg by mouth 2 (two) times daily.     finasteride  (PROSCAR ) 5 MG tablet Take 1 tablet (5 mg total) by mouth daily.  90 tablet 3   furosemide  (LASIX ) 40 MG tablet Take 1 tablet (40 mg total) by mouth every other day. 30 tablet 0   glipiZIDE  (GLUCOTROL  XL) 5 MG 24 hr tablet Take by mouth.     lidocaine -prilocaine (EMLA) cream Apply 1 Application topically as needed (Dialysis).     LOKELMA 10 g PACK packet Take 1 packet by mouth daily.     metoprolol  succinate (TOPROL -XL) 25 MG 24 hr tablet Take 0.5 tablets (12.5 mg total) by mouth daily. 30 tablet 0   sodium bicarbonate  650 MG tablet Take 650 mg by mouth 2 (two) times daily.     tamsulosin  (FLOMAX ) 0.4 MG CAPS capsule Take 1 capsule (0.4 mg total) by mouth daily. 90 capsule 3   No current facility-administered medications for this visit.    Past Medical History:  Diagnosis Date   Anxiety    Arthritis    Depression    Diabetes mellitus without complication (HCC)    Hypertension    Pneumonia    Renal disorder     Past Surgical History:  Procedure Laterality Date   A/V FISTULAGRAM Left 06/08/2023   Procedure: A/V Fistulagram;  Surgeon: Jama Cordella MATSU, MD;  Location: [facility] INVASIVE CV  LAB;  Service: Cardiovascular;  Laterality: Left;   A/V SHUNT INTERVENTION Left 09/29/2023   Procedure: A/V SHUNT INTERVENTION;  Surgeon: Marea Selinda RAMAN, MD;  Location: [facility] INVASIVE CV LAB;  Service: Cardiovascular;  Laterality: Left;   APPLICATION OF WOUND VAC Right 11/14/2021   Procedure: APPLICATION OF WOUND VAC;  Surgeon: Jama Cordella MATSU, MD;  Location: [facility] ORS;  Service: Vascular;  Laterality: Right;   AV FISTULA PLACEMENT Left 11/14/2021   Procedure: ARTERIOVENOUS (AV) FISTULA CREATION ( BRACHIAL CEPHALIC);  Surgeon: Jama Cordella MATSU, MD;  Location: [facility] ORS;  Service: Vascular;  Laterality: Left;     Social History   Tobacco Use   Smoking status: Never   Smokeless tobacco: Never  Substance Use Topics   Alcohol use: No   Drug use: Not Currently      No family history on file.   No Known Allergies   REVIEW OF SYSTEMS (Negative unless  checked)  Constitutional: [] Weight loss  [] Fever  [] Chills Cardiac: [] Chest pain   [] Chest pressure   [] Palpitations   [] Shortness of breath when laying flat   [] Shortness of breath at rest   [x] Shortness of breath with exertion. Vascular:  [] Pain in legs with walking   [] Pain in legs at rest   [] Pain in legs when laying flat   [] Claudication   [] Pain in feet when walking  [] Pain in feet at rest  [] Pain in feet when laying flat   [] History of DVT   [] Phlebitis   [] Swelling in legs   [] Varicose veins   [] Non-healing ulcers Pulmonary:   [] Uses home oxygen   [] Productive cough   [] Hemoptysis   [] Wheeze  [] COPD   [] Asthma Neurologic:  [] Dizziness  [] Blackouts   [] Seizures   [] History of stroke   [] History of TIA  [] Aphasia   [] Temporary blindness   [] Dysphagia   [] Weakness or numbness in arms   [] Weakness or numbness in legs Musculoskeletal:  [] Arthritis   [] Joint swelling   [] Joint pain   [] Low back pain Hematologic:  [] Easy bruising  [] Easy bleeding   [] Hypercoagulable state   [x] Anemic   Gastrointestinal:  [] Blood in stool   [] Vomiting blood  [] Gastroesophageal reflux/heartburn   [] Abdominal pain Genitourinary:  [x] Chronic kidney disease   [] Difficult urination  [] Frequent urination  [] Burning with urination   [] Hematuria Skin:  [] Rashes   [] Ulcers   [] Wounds Psychological:  [] History of anxiety   []  History of major depression.  Physical Examination  BP (!) 112/56   Pulse 79   Resp 18   Wt 227 lb 3.2 oz (103.1 kg)   BMI 26.94 kg/m  Gen:  WD/WN, NAD.  Appears younger than stated age Head: [place]/AT, No temporalis wasting. Ear/Nose/Throat: Hearing grossly intact, nares w/o erythema or drainage Eyes: Conjunctiva clear. Sclera non-icteric Neck: Supple.  Trachea midline Pulmonary:  Good air movement, no use of accessory muscles.  Cardiac: RRR, no JVD Vascular: Good thrill in left brachiocephalic AV fistula Vessel Right Left  Radial Palpable Palpable           Musculoskeletal: M/S 5/5  throughout.  No deformity or atrophy.  Neurologic: Sensation grossly intact in extremities.  Symmetrical.  Speech is fluent.  Psychiatric: Judgment intact, Mood & affect appropriate for pt's clinical situation. Dermatologic: No rashes or ulcers noted.  No cellulitis or open wounds.      Labs Recent Results (from the past 2160 hours)  Urinalysis, Routine w reflex microscopic -Urine, Clean Catch     Status: Abnormal   Collection Time:  08/23/23 10:21 AM  Result Value Ref Range   Color, Urine AMBER (A) YELLOW    Comment: BIOCHEMICALS MAY BE AFFECTED BY COLOR   APPearance HAZY (A) CLEAR   Specific Gravity, Urine 1.015 1.005 - 1.030   pH 6.0 5.0 - 8.0   Glucose, UA NEGATIVE NEGATIVE mg/dL   Hgb urine dipstick LARGE (A) NEGATIVE   Bilirubin Urine NEGATIVE NEGATIVE   Ketones, ur NEGATIVE NEGATIVE mg/dL   Protein, ur 30 (A) NEGATIVE mg/dL   Nitrite NEGATIVE NEGATIVE   Leukocytes,Ua TRACE (A) NEGATIVE   RBC / HPF >50 0 - 5 RBC/hpf   WBC, UA 0-5 0 - 5 WBC/hpf   Bacteria, UA RARE (A) NONE SEEN   Squamous Epithelial / HPF 0-5 0 - 5 /HPF   Mucus PRESENT    Hyaline Casts, UA PRESENT     Comment: Performed at [facility], [address], [place], [place] [address]  Urine Culture     Status: Abnormal   Collection Time: 08/23/23 10:21 AM   Specimen: Urine, Clean Catch  Result Value Ref Range   Specimen Description      URINE, CLEAN CATCH Performed at [facility], [address] [place], [place] [address]    Special Requests      NONE Performed at [facility], [address] [place], [place] [address]    Culture 30,000 COLONIES/mL ENTEROCOCCUS FAECALIS (A)    Report Status 08/26/2023 FINAL    Organism ID, Bacteria ENTEROCOCCUS FAECALIS (A)       Susceptibility   Enterococcus faecalis - MIC*    AMPICILLIN <=2 SENSITIVE Sensitive     NITROFURANTOIN <=16 SENSITIVE Sensitive     VANCOMYCIN 1 SENSITIVE Sensitive     * 30,000 COLONIES/mL ENTEROCOCCUS  FAECALIS  Basic metabolic panel     Status: Abnormal   Collection Time: 08/23/23 10:50 AM  Result Value Ref Range   Sodium 141 135 - 145 mmol/L   Potassium 3.0 (L) 3.5 - 5.1 mmol/L   Chloride 102 98 - 111 mmol/L   CO2 29 22 - 32 mmol/L   Glucose, Bld 67 (L) 70 - 99 mg/dL    Comment: Glucose reference range applies only to samples taken after fasting for at least 8 hours.   BUN 55 (H) 8 - 23 mg/dL   Creatinine, Ser 6.36 (H) 0.61 - 1.24 mg/dL   Calcium 9.3 8.9 - 89.6 mg/dL   GFR, Estimated 16 (L) >60 mL/min    Comment: (NOTE) Calculated using the CKD-EPI Creatinine Equation (2021)    Anion gap 10 5 - 15    Comment: Performed at [facility], [address] [place], [place] [address]  CBC     Status: Abnormal   Collection Time: 08/23/23 10:50 AM  Result Value Ref Range   WBC 4.7 4.0 - 10.5 K/uL   RBC 4.12 (L) 4.22 - 5.81 MIL/uL   Hemoglobin 10.4 (L) 13.0 - 17.0 g/dL   HCT 65.7 (L) 60.9 - 47.9 %   MCV 83.0 80.0 - 100.0 fL   MCH 25.2 (L) 26.0 - 34.0 pg   MCHC 30.4 30.0 - 36.0 g/dL   RDW 84.3 (H) 88.4 - 84.4 %   Platelets 206 150 - 400 K/uL   nRBC 0.0 0.0 - 0.2 %    Comment: Performed at [facility], [address] [place], [place] [address]  Urinalysis, Complete     Status: Abnormal   Collection Time: 08/24/23 10:16 AM  Result  Value Ref Range   Specific Gravity, UA 1.010 1.005 - 1.030   pH, UA 6.0 5.0 - 7.5   Color, UA Yellow Yellow   Appearance Ur Clear Clear   Leukocytes,UA Negative Negative   Protein,UA Negative Negative/Trace   Glucose, UA Negative Negative   Ketones, UA Negative Negative   RBC, UA Trace (A) Negative   Bilirubin, UA Negative Negative   Urobilinogen, Ur 0.2 0.2 - 1.0 mg/dL   Nitrite, UA Negative Negative   Microscopic Examination See below:     Comment: Microscopic was indicated and was performed.  Microscopic Examination     Status: Abnormal   Collection Time: 08/24/23 10:16 AM   Urine  Result Value Ref Range   WBC, UA 0-5 0  - 5 /hpf   RBC, Urine 3-10 (A) 0 - 2 /hpf   Epithelial Cells (non renal) 0-10 0 - 10 /hpf   Bacteria, UA Few None seen/Few  Potassium [facility] vascular lab only)     Status: None   Collection Time: 09/29/23  7:26 AM  Result Value Ref Range   Potassium [facility] vascular lab) 4.9 3.5 - 5.1 mmol/L    Comment: Performed at [facility], [address] [place], [place] [address]  Glucose, capillary     Status: None   Collection Time: 09/29/23  7:28 AM  Result Value Ref Range   Glucose-Capillary 83 70 - 99 mg/dL    Comment: Glucose reference range applies only to samples taken after fasting for at least 8 hours.    Radiology No results found.  Assessment/Plan  End stage renal disease (HCC) His duplex today shows a patent left brachiocephalic AV fistula without focal stenosis after intervention.  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  This is not currently that bothersome to him.  Fistula has had multiple previous interventions but appears to have good flow without any focal issues today.  The aneurysm is not really bothering him much and as long as we avoid repetitive punctures in the same area, this is unlikely to become a larger issue.  Plan to follow-up in 6 months with duplex or sooner if problems develop in the interim.  Type 2 diabetes mellitus (HCC) blood glucose control important in reducing the progression of atherosclerotic disease. Also, involved in wound healing. On appropriate medications.   Benign essential hypertension blood pressure control important in reducing the progression of atherosclerotic disease. On appropriate oral medications.    Selinda Gu, MD  11/02/2023 9:09 AM    This note was created with Dragon medical transcription system.  Any errors from dictation are purely unintentional

## 2023-12-27 ENCOUNTER — Encounter (INDEPENDENT_AMBULATORY_CARE_PROVIDER_SITE_OTHER)

## 2023-12-27 ENCOUNTER — Ambulatory Visit (INDEPENDENT_AMBULATORY_CARE_PROVIDER_SITE_OTHER): Admitting: Nurse Practitioner

## 2023-12-30 ENCOUNTER — Encounter (INDEPENDENT_AMBULATORY_CARE_PROVIDER_SITE_OTHER)

## 2023-12-30 ENCOUNTER — Telehealth (INDEPENDENT_AMBULATORY_CARE_PROVIDER_SITE_OTHER): Payer: Self-pay | Admitting: Vascular Surgery

## 2023-12-30 ENCOUNTER — Ambulatory Visit (INDEPENDENT_AMBULATORY_CARE_PROVIDER_SITE_OTHER): Admitting: Nurse Practitioner

## 2023-12-30 NOTE — Telephone Encounter (Signed)
 Pt was scheduled for 01/04/24 with [facility] for prolonged bleeding. Amber from [facility] called and stated that pt can't come to this appt due to changing his dialysis day for Christmas. Pt was r/s to 01/12/24. Amber called today on 12/30/23 and states that is pt regular dialysis day is on Wednesday the 31st. I advised that this was an issue that needs to be checked on sooner rather than later and if there was any way to bring pt in sooner to call [facility] back and let us  know. Amber verbalized understanding, and stated that pt daughter did not want to bring him in at 7:30 AM on 01/12/24 due to it being early and worried about being late to dialysis. Pt is now scheduled for split next available appt on 01/18/23 for the US  and 01/20/23 for the consult with the provider.

## 2024-01-04 ENCOUNTER — Encounter (INDEPENDENT_AMBULATORY_CARE_PROVIDER_SITE_OTHER)

## 2024-01-04 ENCOUNTER — Ambulatory Visit (INDEPENDENT_AMBULATORY_CARE_PROVIDER_SITE_OTHER): Admitting: Nurse Practitioner

## 2024-01-12 ENCOUNTER — Encounter (INDEPENDENT_AMBULATORY_CARE_PROVIDER_SITE_OTHER)

## 2024-01-12 ENCOUNTER — Ambulatory Visit (INDEPENDENT_AMBULATORY_CARE_PROVIDER_SITE_OTHER): Admitting: Nurse Practitioner

## 2024-01-18 ENCOUNTER — Ambulatory Visit (INDEPENDENT_AMBULATORY_CARE_PROVIDER_SITE_OTHER)

## 2024-01-18 DIAGNOSIS — N186 End stage renal disease: Secondary | ICD-10-CM | POA: Diagnosis not present

## 2024-01-20 ENCOUNTER — Ambulatory Visit (INDEPENDENT_AMBULATORY_CARE_PROVIDER_SITE_OTHER): Admitting: Nurse Practitioner

## 2024-01-20 ENCOUNTER — Encounter (INDEPENDENT_AMBULATORY_CARE_PROVIDER_SITE_OTHER): Payer: Self-pay | Admitting: Nurse Practitioner

## 2024-01-20 VITALS — BP 119/68 | HR 120 | Resp 17 | Ht 77.0 in | Wt 172.0 lb

## 2024-01-20 DIAGNOSIS — Z992 Dependence on renal dialysis: Secondary | ICD-10-CM | POA: Diagnosis not present

## 2024-01-20 DIAGNOSIS — I10 Essential (primary) hypertension: Secondary | ICD-10-CM

## 2024-01-20 DIAGNOSIS — N186 End stage renal disease: Secondary | ICD-10-CM

## 2024-01-20 DIAGNOSIS — E1122 Type 2 diabetes mellitus with diabetic chronic kidney disease: Secondary | ICD-10-CM

## 2024-01-22 ENCOUNTER — Inpatient Hospital Stay
Admission: EM | Admit: 2024-01-22 | Discharge: 2024-02-13 | DRG: 871 | Disposition: E | Attending: Student in an Organized Health Care Education/Training Program | Admitting: Student in an Organized Health Care Education/Training Program

## 2024-01-22 ENCOUNTER — Emergency Department

## 2024-01-22 ENCOUNTER — Other Ambulatory Visit: Payer: Self-pay

## 2024-01-22 DIAGNOSIS — R571 Hypovolemic shock: Secondary | ICD-10-CM | POA: Diagnosis present

## 2024-01-22 DIAGNOSIS — B961 Klebsiella pneumoniae [K. pneumoniae] as the cause of diseases classified elsewhere: Secondary | ICD-10-CM | POA: Diagnosis not present

## 2024-01-22 DIAGNOSIS — R6521 Severe sepsis with septic shock: Secondary | ICD-10-CM | POA: Diagnosis present

## 2024-01-22 DIAGNOSIS — Z992 Dependence on renal dialysis: Secondary | ICD-10-CM | POA: Diagnosis not present

## 2024-01-22 DIAGNOSIS — E46 Unspecified protein-calorie malnutrition: Secondary | ICD-10-CM | POA: Diagnosis present

## 2024-01-22 DIAGNOSIS — N329 Bladder disorder, unspecified: Secondary | ICD-10-CM | POA: Diagnosis present

## 2024-01-22 DIAGNOSIS — R627 Adult failure to thrive: Secondary | ICD-10-CM | POA: Diagnosis present

## 2024-01-22 DIAGNOSIS — K5900 Constipation, unspecified: Secondary | ICD-10-CM | POA: Diagnosis not present

## 2024-01-22 DIAGNOSIS — I132 Hypertensive heart and chronic kidney disease with heart failure and with stage 5 chronic kidney disease, or end stage renal disease: Secondary | ICD-10-CM | POA: Diagnosis present

## 2024-01-22 DIAGNOSIS — B952 Enterococcus as the cause of diseases classified elsewhere: Secondary | ICD-10-CM

## 2024-01-22 DIAGNOSIS — E872 Acidosis, unspecified: Secondary | ICD-10-CM | POA: Diagnosis present

## 2024-01-22 DIAGNOSIS — Z681 Body mass index (BMI) 19 or less, adult: Secondary | ICD-10-CM

## 2024-01-22 DIAGNOSIS — I482 Chronic atrial fibrillation, unspecified: Secondary | ICD-10-CM | POA: Diagnosis present

## 2024-01-22 DIAGNOSIS — Q613 Polycystic kidney, unspecified: Secondary | ICD-10-CM

## 2024-01-22 DIAGNOSIS — E1151 Type 2 diabetes mellitus with diabetic peripheral angiopathy without gangrene: Secondary | ICD-10-CM | POA: Diagnosis present

## 2024-01-22 DIAGNOSIS — Z79899 Other long term (current) drug therapy: Secondary | ICD-10-CM

## 2024-01-22 DIAGNOSIS — J101 Influenza due to other identified influenza virus with other respiratory manifestations: Secondary | ICD-10-CM

## 2024-01-22 DIAGNOSIS — J9601 Acute respiratory failure with hypoxia: Secondary | ICD-10-CM | POA: Diagnosis not present

## 2024-01-22 DIAGNOSIS — R188 Other ascites: Secondary | ICD-10-CM | POA: Diagnosis present

## 2024-01-22 DIAGNOSIS — E1165 Type 2 diabetes mellitus with hyperglycemia: Secondary | ICD-10-CM | POA: Diagnosis present

## 2024-01-22 DIAGNOSIS — I2510 Atherosclerotic heart disease of native coronary artery without angina pectoris: Secondary | ICD-10-CM | POA: Diagnosis present

## 2024-01-22 DIAGNOSIS — Z1152 Encounter for screening for COVID-19: Secondary | ICD-10-CM

## 2024-01-22 DIAGNOSIS — E876 Hypokalemia: Secondary | ICD-10-CM | POA: Diagnosis not present

## 2024-01-22 DIAGNOSIS — L89152 Pressure ulcer of sacral region, stage 2: Secondary | ICD-10-CM | POA: Diagnosis present

## 2024-01-22 DIAGNOSIS — N186 End stage renal disease: Secondary | ICD-10-CM | POA: Diagnosis not present

## 2024-01-22 DIAGNOSIS — Z9081 Acquired absence of spleen: Secondary | ICD-10-CM

## 2024-01-22 DIAGNOSIS — I5033 Acute on chronic diastolic (congestive) heart failure: Secondary | ICD-10-CM | POA: Diagnosis present

## 2024-01-22 DIAGNOSIS — Z7902 Long term (current) use of antithrombotics/antiplatelets: Secondary | ICD-10-CM

## 2024-01-22 DIAGNOSIS — I4892 Unspecified atrial flutter: Secondary | ICD-10-CM | POA: Diagnosis present

## 2024-01-22 DIAGNOSIS — A4181 Sepsis due to Enterococcus: Principal | ICD-10-CM | POA: Diagnosis present

## 2024-01-22 DIAGNOSIS — Z66 Do not resuscitate: Secondary | ICD-10-CM | POA: Diagnosis present

## 2024-01-22 DIAGNOSIS — G928 Other toxic encephalopathy: Secondary | ICD-10-CM | POA: Diagnosis not present

## 2024-01-22 DIAGNOSIS — Z515 Encounter for palliative care: Secondary | ICD-10-CM

## 2024-01-22 DIAGNOSIS — R7881 Bacteremia: Secondary | ICD-10-CM

## 2024-01-22 DIAGNOSIS — D631 Anemia in chronic kidney disease: Secondary | ICD-10-CM | POA: Diagnosis present

## 2024-01-22 DIAGNOSIS — R079 Chest pain, unspecified: Principal | ICD-10-CM | POA: Diagnosis present

## 2024-01-22 DIAGNOSIS — F0393 Unspecified dementia, unspecified severity, with mood disturbance: Secondary | ICD-10-CM | POA: Diagnosis present

## 2024-01-22 DIAGNOSIS — E1122 Type 2 diabetes mellitus with diabetic chronic kidney disease: Secondary | ICD-10-CM | POA: Diagnosis present

## 2024-01-22 DIAGNOSIS — Z8673 Personal history of transient ischemic attack (TIA), and cerebral infarction without residual deficits: Secondary | ICD-10-CM

## 2024-01-22 DIAGNOSIS — J111 Influenza due to unidentified influenza virus with other respiratory manifestations: Secondary | ICD-10-CM | POA: Insufficient documentation

## 2024-01-22 DIAGNOSIS — E871 Hypo-osmolality and hyponatremia: Secondary | ICD-10-CM | POA: Diagnosis present

## 2024-01-22 DIAGNOSIS — R7989 Other specified abnormal findings of blood chemistry: Secondary | ICD-10-CM

## 2024-01-22 DIAGNOSIS — G9341 Metabolic encephalopathy: Secondary | ICD-10-CM | POA: Diagnosis present

## 2024-01-22 DIAGNOSIS — D735 Infarction of spleen: Secondary | ICD-10-CM | POA: Diagnosis present

## 2024-01-22 DIAGNOSIS — I6389 Other cerebral infarction: Secondary | ICD-10-CM | POA: Diagnosis not present

## 2024-01-22 DIAGNOSIS — L8990 Pressure ulcer of unspecified site, unspecified stage: Secondary | ICD-10-CM | POA: Insufficient documentation

## 2024-01-22 DIAGNOSIS — K922 Gastrointestinal hemorrhage, unspecified: Secondary | ICD-10-CM | POA: Diagnosis present

## 2024-01-22 DIAGNOSIS — Z9181 History of falling: Secondary | ICD-10-CM

## 2024-01-22 DIAGNOSIS — F32A Depression, unspecified: Secondary | ICD-10-CM | POA: Diagnosis present

## 2024-01-22 DIAGNOSIS — D733 Abscess of spleen: Secondary | ICD-10-CM | POA: Diagnosis present

## 2024-01-22 DIAGNOSIS — F0394 Unspecified dementia, unspecified severity, with anxiety: Secondary | ICD-10-CM | POA: Diagnosis present

## 2024-01-22 DIAGNOSIS — K381 Appendicular concretions: Secondary | ICD-10-CM | POA: Diagnosis present

## 2024-01-22 DIAGNOSIS — N400 Enlarged prostate without lower urinary tract symptoms: Secondary | ICD-10-CM | POA: Diagnosis present

## 2024-01-22 DIAGNOSIS — R195 Other fecal abnormalities: Secondary | ICD-10-CM | POA: Diagnosis present

## 2024-01-22 DIAGNOSIS — E785 Hyperlipidemia, unspecified: Secondary | ICD-10-CM | POA: Diagnosis present

## 2024-01-22 DIAGNOSIS — Z87891 Personal history of nicotine dependence: Secondary | ICD-10-CM

## 2024-01-22 DIAGNOSIS — Z7901 Long term (current) use of anticoagulants: Secondary | ICD-10-CM

## 2024-01-22 HISTORY — DX: End stage renal disease: N18.6

## 2024-01-22 LAB — COMPREHENSIVE METABOLIC PANEL WITH GFR
ALT: 70 U/L — ABNORMAL HIGH (ref 0–44)
AST: 73 U/L — ABNORMAL HIGH (ref 15–41)
Albumin: 3.1 g/dL — ABNORMAL LOW (ref 3.5–5.0)
Alkaline Phosphatase: 86 U/L (ref 38–126)
Anion gap: 13 (ref 5–15)
BUN: 46 mg/dL — ABNORMAL HIGH (ref 8–23)
CO2: 27 mmol/L (ref 22–32)
Calcium: 8.4 mg/dL — ABNORMAL LOW (ref 8.9–10.3)
Chloride: 94 mmol/L — ABNORMAL LOW (ref 98–111)
Creatinine, Ser: 4.7 mg/dL — ABNORMAL HIGH (ref 0.61–1.24)
GFR, Estimated: 12 mL/min — ABNORMAL LOW
Glucose, Bld: 134 mg/dL — ABNORMAL HIGH (ref 70–99)
Potassium: 3.5 mmol/L (ref 3.5–5.1)
Sodium: 134 mmol/L — ABNORMAL LOW (ref 135–145)
Total Bilirubin: 0.5 mg/dL (ref 0.0–1.2)
Total Protein: 7.6 g/dL (ref 6.5–8.1)

## 2024-01-22 LAB — CBC
HCT: 25.1 % — ABNORMAL LOW (ref 39.0–52.0)
Hemoglobin: 8 g/dL — ABNORMAL LOW (ref 13.0–17.0)
MCH: 24.4 pg — ABNORMAL LOW (ref 26.0–34.0)
MCHC: 31.9 g/dL (ref 30.0–36.0)
MCV: 76.5 fL — ABNORMAL LOW (ref 80.0–100.0)
Platelets: 209 K/uL (ref 150–400)
RBC: 3.28 MIL/uL — ABNORMAL LOW (ref 4.22–5.81)
RDW: 17.8 % — ABNORMAL HIGH (ref 11.5–15.5)
WBC: 11.8 K/uL — ABNORMAL HIGH (ref 4.0–10.5)
nRBC: 0 % (ref 0.0–0.2)

## 2024-01-22 LAB — LACTIC ACID, PLASMA: Lactic Acid, Venous: 2.2 mmol/L (ref 0.5–1.9)

## 2024-01-22 LAB — TROPONIN T, HIGH SENSITIVITY
Troponin T High Sensitivity: 634 ng/L (ref 0–19)
Troponin T High Sensitivity: 632 ng/L (ref 0–19)

## 2024-01-22 LAB — PRO BRAIN NATRIURETIC PEPTIDE: Pro Brain Natriuretic Peptide: >35000 pg/mL — ABNORMAL HIGH

## 2024-01-22 MED ORDER — CALCIUM CARBONATE ANTACID 500 MG PO CHEW
1.0000 | CHEWABLE_TABLET | Freq: Once | ORAL | Status: AC
  Administered 2024-01-22: 200 mg via ORAL
  Filled 2024-01-22: qty 1

## 2024-01-22 MED ORDER — FAMOTIDINE 20 MG PO TABS
40.0000 mg | ORAL_TABLET | Freq: Once | ORAL | Status: AC
  Administered 2024-01-22: 40 mg via ORAL
  Filled 2024-01-22: qty 2

## 2024-01-22 NOTE — ED Triage Notes (Signed)
 To ED from home (lives with family) [facility] for chest pain/pressure and SOB since last few days.  Has influenza currently. Hx a fib, CHF, dementia and is on dialysis.    EMS VS: 100% RA, HR 108, 103/50, CBG 164. 12 lead ST.  Pt is currently alert and oriented. Complains of SOB. Last dialysis was yesterday. Gets M W F. Respirations appear unlabored. Pt denies pain.  Sending 1 set cultures w labs.

## 2024-01-22 NOTE — ED Provider Notes (Signed)
 "  [facility] Provider Note    None    (approximate)   History   Chief Complaint: Shortness of Breath   HPI  Jeffery Joseph is a 84 y.o. male with a history of hypertension, diabetes, ESRD on hemodialysis Monday Wednesday Friday who comes ED complaining of chest pressure, shortness of breath over the last few days.  Also has increased generalized weakness.  Was diagnosed with the flu 2 days ago and started on Tamiflu .  Multiple family member caregivers also are sick with influenza.        Past Medical History:  Diagnosis Date   Anxiety    Arthritis    Depression    Diabetes mellitus without complication (HCC)    End stage renal disease (HCC)    Hypertension    Pneumonia    Renal disorder     Current Outpatient Rx   Order #: [id number] Class: Historical Med   Order #: [id number] Class: Historical Med   Order #: [id number] Class: Normal   Order #: [id number] Class: Normal   Order #: [id number] Class: Historical Med   Order #: [id number] Class: Normal   Order #: [id number] Class: Normal   Order #: [id number] Class: Historical Med   Order #: [id number] Class: Historical Med   Order #: [id number] Class: Normal   Order #: [id number] Class: Historical Med   Order #: [id number] Class: Normal   Order #: [id number] Class: Normal   Order #: [id number] Class: Historical Med   Order #: [id number] Class: Normal   Order #: [id number] Class: Historical Med   Order #: [id number] Class: Historical Med   Order #: [id number] Class: Historical Med    Past Surgical History:  Procedure Laterality Date   A/V FISTULAGRAM Left 06/08/2023   Procedure: A/V Fistulagram;  Surgeon: Jama Cordella MATSU, MD;  Location: [facility] INVASIVE CV LAB;  Service: Cardiovascular;  Laterality: Left;   A/V SHUNT INTERVENTION Left 09/29/2023   Procedure: A/V SHUNT INTERVENTION;  Surgeon: Marea Selinda RAMAN, MD;  Location: [facility] INVASIVE CV LAB;  Service: Cardiovascular;  Laterality: Left;   APPLICATION OF WOUND VAC Right  11/14/2021   Procedure: APPLICATION OF WOUND VAC;  Surgeon: Jama Cordella MATSU, MD;  Location: [facility] ORS;  Service: Vascular;  Laterality: Right;   AV FISTULA PLACEMENT Left 11/14/2021   Procedure: ARTERIOVENOUS (AV) FISTULA CREATION ( BRACHIAL CEPHALIC);  Surgeon: Jama Cordella MATSU, MD;  Location: [facility] ORS;  Service: Vascular;  Laterality: Left;    Physical Exam   Triage Vital Signs: ED Triage Vitals  Encounter Vitals Group     BP 01/22/24 1737 (!) 106/49     Girls Systolic BP Percentile --      Girls Diastolic BP Percentile --      Boys Systolic BP Percentile --      Boys Diastolic BP Percentile --      Pulse Rate 01/22/24 1737 (!) 111     Resp 01/22/24 1737 20     Temp 01/22/24 1737 98 F (36.7 C)     Temp Source 01/22/24 1737 Oral     SpO2 01/22/24 1737 100 %     Weight 01/22/24 1740 140 lb (63.5 kg)     Height 01/22/24 1740 6' 5 (1.956 m)     Head Circumference --      Peak Flow --      Pain Score 01/22/24 1738 0     Pain Loc --      Pain Education --      Exclude from Growth Chart --     Most recent vital signs:  Vitals:   01/22/24 1928 01/22/24 2156  BP:  (!) 97/52  Pulse:  (!) 106  Resp:  20  Temp:  97.6 F (36.4 C)  SpO2: 100% 100%    General: Awake, no distress.  CV:  Good peripheral perfusion.  Tachycardia heart rate 110 Resp:  Normal effort.  Clear lungs Abd:  No distention.  Soft nontender Other:  Dry oral mucosa   ED Results / Procedures / Treatments   Labs (all labs ordered are listed, but only abnormal results are displayed) Labs Reviewed  CBC - Abnormal; Notable for the following components:      Result Value   WBC 11.8 (*)    RBC 3.28 (*)    Hemoglobin 8.0 (*)    HCT 25.1 (*)    MCV 76.5 (*)    MCH 24.4 (*)    RDW 17.8 (*)    All other components within normal limits  COMPREHENSIVE METABOLIC PANEL WITH GFR - Abnormal; Notable for the following components:   Sodium 134 (*)    Chloride 94 (*)    Glucose, Bld 134 (*)    BUN 46 (*)     Creatinine, Ser 4.70 (*)    Calcium  8.4 (*)    Albumin  3.1 (*)    AST 73 (*)    ALT 70 (*)    GFR, Estimated 12 (*)    All other components within normal limits  LACTIC ACID, PLASMA - Abnormal; Notable for the following components:   Lactic Acid, Venous 2.2 (*)    All other components within normal limits  PRO BRAIN NATRIURETIC PEPTIDE - Abnormal; Notable for the following components:   Pro Brain Natriuretic Peptide >35,000.0 (*)    All other components within normal limits  TROPONIN T, HIGH SENSITIVITY - Abnormal; Notable for the following components:   Troponin T High Sensitivity 634 (*)    All other components within normal limits  TROPONIN T, HIGH SENSITIVITY - Abnormal; Notable for the following components:   Troponin T High Sensitivity 632 (*)    All other components within normal limits     EKG Interpreted by me Atrial fibrillation rate of 111.  Left axis, right bundle branch block, no acute ischemic changes.   RADIOLOGY Chest x-ray interpreted by me unremarkable.  Radiology report reviewed CT chest unremarkable   PROCEDURES:  Procedures   MEDICATIONS ORDERED IN ED: Medications  calcium  carbonate (TUMS - dosed in mg elemental calcium ) chewable tablet 200 mg of elemental calcium  (200 mg of elemental calcium  Oral Given 01/22/24 2045)  famotidine  (PEPCID ) tablet 40 mg (40 mg Oral Given 01/22/24 2045)     IMPRESSION / MDM / ASSESSMENT AND PLAN / ED COURSE  I reviewed the triage vital signs and the nursing notes.  DDx: Pneumonia, pleural effusion, pulmonary edema, non-STEMI, decompensated heart failure, electrolyte derangement, Tamiflu  side effect, influenza  Patient's presentation is most consistent with acute presentation with potential threat to life or bodily function.  Patient with severe comorbidities comes ED with generalized weakness, decline in functional status in the setting of influenza.  Has been compliant with medications and dialysis, but not able to  be cared for adequately in his home environment with available support in the setting of this illness.   Clinical Course as of 01/22/24 2202  Sat Jan 22, 2024  2043 Case d/w hospitalist [PS]    Clinical Course User Index [PS] Viviann Pastor, MD     FINAL CLINICAL IMPRESSION(S) / ED DIAGNOSES   Final diagnoses:  Chest pain with moderate risk for cardiac etiology  Elevated troponin  ESRD on hemodialysis (HCC)  Chronic atrial fibrillation (HCC)  Influenza     Rx / DC Orders   ED Discharge Orders     None        Note:  This document was prepared using Dragon voice recognition software and may include unintentional dictation errors.   Viviann Pastor, MD 01/22/24 2202  "

## 2024-01-23 ENCOUNTER — Inpatient Hospital Stay

## 2024-01-23 DIAGNOSIS — Z7189 Other specified counseling: Secondary | ICD-10-CM | POA: Diagnosis not present

## 2024-01-23 DIAGNOSIS — I482 Chronic atrial fibrillation, unspecified: Secondary | ICD-10-CM | POA: Diagnosis present

## 2024-01-23 DIAGNOSIS — N400 Enlarged prostate without lower urinary tract symptoms: Secondary | ICD-10-CM | POA: Insufficient documentation

## 2024-01-23 DIAGNOSIS — I132 Hypertensive heart and chronic kidney disease with heart failure and with stage 5 chronic kidney disease, or end stage renal disease: Secondary | ICD-10-CM | POA: Diagnosis present

## 2024-01-23 DIAGNOSIS — B961 Klebsiella pneumoniae [K. pneumoniae] as the cause of diseases classified elsewhere: Secondary | ICD-10-CM | POA: Diagnosis not present

## 2024-01-23 DIAGNOSIS — E46 Unspecified protein-calorie malnutrition: Secondary | ICD-10-CM | POA: Diagnosis present

## 2024-01-23 DIAGNOSIS — E1151 Type 2 diabetes mellitus with diabetic peripheral angiopathy without gangrene: Secondary | ICD-10-CM | POA: Diagnosis not present

## 2024-01-23 DIAGNOSIS — Z7902 Long term (current) use of antithrombotics/antiplatelets: Secondary | ICD-10-CM | POA: Diagnosis not present

## 2024-01-23 DIAGNOSIS — D733 Abscess of spleen: Secondary | ICD-10-CM | POA: Diagnosis present

## 2024-01-23 DIAGNOSIS — E871 Hypo-osmolality and hyponatremia: Secondary | ICD-10-CM | POA: Diagnosis not present

## 2024-01-23 DIAGNOSIS — Z515 Encounter for palliative care: Secondary | ICD-10-CM | POA: Diagnosis not present

## 2024-01-23 DIAGNOSIS — I4891 Unspecified atrial fibrillation: Secondary | ICD-10-CM | POA: Diagnosis not present

## 2024-01-23 DIAGNOSIS — R579 Shock, unspecified: Secondary | ICD-10-CM | POA: Diagnosis not present

## 2024-01-23 DIAGNOSIS — I6350 Cerebral infarction due to unspecified occlusion or stenosis of unspecified cerebral artery: Secondary | ICD-10-CM | POA: Diagnosis not present

## 2024-01-23 DIAGNOSIS — K922 Gastrointestinal hemorrhage, unspecified: Secondary | ICD-10-CM | POA: Diagnosis present

## 2024-01-23 DIAGNOSIS — F0393 Unspecified dementia, unspecified severity, with mood disturbance: Secondary | ICD-10-CM | POA: Diagnosis present

## 2024-01-23 DIAGNOSIS — Z992 Dependence on renal dialysis: Secondary | ICD-10-CM | POA: Diagnosis not present

## 2024-01-23 DIAGNOSIS — R7989 Other specified abnormal findings of blood chemistry: Secondary | ICD-10-CM

## 2024-01-23 DIAGNOSIS — G928 Other toxic encephalopathy: Secondary | ICD-10-CM | POA: Diagnosis not present

## 2024-01-23 DIAGNOSIS — A419 Sepsis, unspecified organism: Secondary | ICD-10-CM | POA: Diagnosis not present

## 2024-01-23 DIAGNOSIS — R079 Chest pain, unspecified: Secondary | ICD-10-CM | POA: Diagnosis not present

## 2024-01-23 DIAGNOSIS — E876 Hypokalemia: Secondary | ICD-10-CM | POA: Diagnosis not present

## 2024-01-23 DIAGNOSIS — J101 Influenza due to other identified influenza virus with other respiratory manifestations: Secondary | ICD-10-CM

## 2024-01-23 DIAGNOSIS — D631 Anemia in chronic kidney disease: Secondary | ICD-10-CM | POA: Diagnosis present

## 2024-01-23 DIAGNOSIS — I4892 Unspecified atrial flutter: Secondary | ICD-10-CM | POA: Diagnosis present

## 2024-01-23 DIAGNOSIS — D649 Anemia, unspecified: Secondary | ICD-10-CM

## 2024-01-23 DIAGNOSIS — Z66 Do not resuscitate: Secondary | ICD-10-CM | POA: Diagnosis present

## 2024-01-23 DIAGNOSIS — E1122 Type 2 diabetes mellitus with diabetic chronic kidney disease: Secondary | ICD-10-CM | POA: Insufficient documentation

## 2024-01-23 DIAGNOSIS — G9340 Encephalopathy, unspecified: Secondary | ICD-10-CM | POA: Diagnosis not present

## 2024-01-23 DIAGNOSIS — N186 End stage renal disease: Secondary | ICD-10-CM | POA: Diagnosis present

## 2024-01-23 DIAGNOSIS — R571 Hypovolemic shock: Secondary | ICD-10-CM | POA: Diagnosis present

## 2024-01-23 DIAGNOSIS — J810 Acute pulmonary edema: Secondary | ICD-10-CM | POA: Diagnosis not present

## 2024-01-23 DIAGNOSIS — I5033 Acute on chronic diastolic (congestive) heart failure: Secondary | ICD-10-CM | POA: Diagnosis present

## 2024-01-23 DIAGNOSIS — I6389 Other cerebral infarction: Secondary | ICD-10-CM | POA: Diagnosis not present

## 2024-01-23 DIAGNOSIS — A4181 Sepsis due to Enterococcus: Secondary | ICD-10-CM | POA: Diagnosis present

## 2024-01-23 DIAGNOSIS — J111 Influenza due to unidentified influenza virus with other respiratory manifestations: Secondary | ICD-10-CM | POA: Insufficient documentation

## 2024-01-23 DIAGNOSIS — R7881 Bacteremia: Secondary | ICD-10-CM | POA: Diagnosis not present

## 2024-01-23 DIAGNOSIS — J9601 Acute respiratory failure with hypoxia: Secondary | ICD-10-CM | POA: Diagnosis not present

## 2024-01-23 DIAGNOSIS — R6521 Severe sepsis with septic shock: Secondary | ICD-10-CM | POA: Diagnosis present

## 2024-01-23 DIAGNOSIS — G9341 Metabolic encephalopathy: Secondary | ICD-10-CM | POA: Diagnosis present

## 2024-01-23 DIAGNOSIS — I959 Hypotension, unspecified: Secondary | ICD-10-CM | POA: Diagnosis not present

## 2024-01-23 DIAGNOSIS — I120 Hypertensive chronic kidney disease with stage 5 chronic kidney disease or end stage renal disease: Secondary | ICD-10-CM | POA: Diagnosis not present

## 2024-01-23 DIAGNOSIS — Z7901 Long term (current) use of anticoagulants: Secondary | ICD-10-CM | POA: Diagnosis not present

## 2024-01-23 DIAGNOSIS — Z1152 Encounter for screening for COVID-19: Secondary | ICD-10-CM | POA: Diagnosis not present

## 2024-01-23 DIAGNOSIS — B952 Enterococcus as the cause of diseases classified elsewhere: Secondary | ICD-10-CM | POA: Diagnosis not present

## 2024-01-23 DIAGNOSIS — D735 Infarction of spleen: Secondary | ICD-10-CM | POA: Diagnosis not present

## 2024-01-23 DIAGNOSIS — F32A Depression, unspecified: Secondary | ICD-10-CM | POA: Diagnosis present

## 2024-01-23 DIAGNOSIS — Z794 Long term (current) use of insulin: Secondary | ICD-10-CM | POA: Diagnosis not present

## 2024-01-23 DIAGNOSIS — I6381 Other cerebral infarction due to occlusion or stenosis of small artery: Secondary | ICD-10-CM | POA: Diagnosis not present

## 2024-01-23 LAB — APTT
aPTT: 48 s — ABNORMAL HIGH (ref 24–36)
aPTT: 49 s — ABNORMAL HIGH (ref 24–36)

## 2024-01-23 LAB — CBG MONITORING, ED
Glucose-Capillary: 98 mg/dL (ref 70–99)
Glucose-Capillary: 89 mg/dL (ref 70–99)
Glucose-Capillary: 96 mg/dL (ref 70–99)
Glucose-Capillary: 105 mg/dL — ABNORMAL HIGH (ref 70–99)

## 2024-01-23 LAB — LACTIC ACID, PLASMA: Lactic Acid, Venous: 1.5 mmol/L (ref 0.5–1.9)

## 2024-01-23 LAB — HEPARIN LEVEL (UNFRACTIONATED): Heparin Unfractionated: >1.1 [IU]/mL — ABNORMAL HIGH (ref 0.30–0.70)

## 2024-01-23 LAB — PROTIME-INR
INR: 2.1 — ABNORMAL HIGH (ref 0.8–1.2)
INR: 2.2 — ABNORMAL HIGH (ref 0.8–1.2)
Prothrombin Time: 24.6 s — ABNORMAL HIGH (ref 11.4–15.2)
Prothrombin Time: 25.6 s — ABNORMAL HIGH (ref 11.4–15.2)

## 2024-01-23 LAB — CBC
HCT: 15.3 % — ABNORMAL LOW (ref 39.0–52.0)
Hemoglobin: 4.5 g/dL — CL (ref 13.0–17.0)
MCH: 24.2 pg — ABNORMAL LOW (ref 26.0–34.0)
MCHC: 29.4 g/dL — ABNORMAL LOW (ref 30.0–36.0)
MCV: 82.3 fL (ref 80.0–100.0)
Platelets: 117 K/uL — ABNORMAL LOW (ref 150–400)
RBC: 1.86 MIL/uL — ABNORMAL LOW (ref 4.22–5.81)
RDW: 18.4 % — ABNORMAL HIGH (ref 11.5–15.5)
WBC: 5.8 K/uL (ref 4.0–10.5)
nRBC: 0 % (ref 0.0–0.2)

## 2024-01-23 LAB — BLOOD GAS, VENOUS
Acid-Base Excess: 2.4 mmol/L — ABNORMAL HIGH (ref 0.0–2.0)
Bicarbonate: 27.2 mmol/L (ref 20.0–28.0)
O2 Saturation: 81.4 %
Patient temperature: 37
pCO2, Ven: 42 mmHg — ABNORMAL LOW (ref 44–60)
pH, Ven: 7.42 (ref 7.25–7.43)
pO2, Ven: 52 mmHg — ABNORMAL HIGH (ref 32–45)

## 2024-01-23 LAB — IRON AND TIBC
Iron: 29 ug/dL — ABNORMAL LOW (ref 45–182)
Saturation Ratios: 16 % — ABNORMAL LOW (ref 17.9–39.5)
TIBC: 178 ug/dL — ABNORMAL LOW (ref 250–450)
UIBC: 149 ug/dL

## 2024-01-23 LAB — HEMOGLOBIN A1C
Hgb A1c MFr Bld: 5.9 % — ABNORMAL HIGH (ref 4.8–5.6)
Mean Plasma Glucose: 122.63 mg/dL

## 2024-01-23 LAB — BASIC METABOLIC PANEL WITH GFR
Anion gap: 13 (ref 5–15)
BUN: 47 mg/dL — ABNORMAL HIGH (ref 8–23)
CO2: 23 mmol/L (ref 22–32)
Calcium: 8 mg/dL — ABNORMAL LOW (ref 8.9–10.3)
Chloride: 97 mmol/L — ABNORMAL LOW (ref 98–111)
Creatinine, Ser: 4.68 mg/dL — ABNORMAL HIGH (ref 0.61–1.24)
GFR, Estimated: 12 mL/min — ABNORMAL LOW
Glucose, Bld: 105 mg/dL — ABNORMAL HIGH (ref 70–99)
Potassium: 3.4 mmol/L — ABNORMAL LOW (ref 3.5–5.1)
Sodium: 133 mmol/L — ABNORMAL LOW (ref 135–145)

## 2024-01-23 LAB — HEMOGLOBIN: Hemoglobin: 7.6 g/dL — ABNORMAL LOW (ref 13.0–17.0)

## 2024-01-23 LAB — RETICULOCYTES
Immature Retic Fract: 30.6 % — ABNORMAL HIGH (ref 2.3–15.9)
RBC.: 1.87 MIL/uL — ABNORMAL LOW (ref 4.22–5.81)
Retic Count, Absolute: 39.3 K/uL (ref 19.0–186.0)
Retic Ct Pct: 2.1 % (ref 0.4–3.1)

## 2024-01-23 LAB — TROPONIN T, HIGH SENSITIVITY
Troponin T High Sensitivity: 641 ng/L (ref 0–19)
Troponin T High Sensitivity: 695 ng/L (ref 0–19)

## 2024-01-23 LAB — HEMOGLOBIN AND HEMATOCRIT, BLOOD
HCT: 24 % — ABNORMAL LOW (ref 39.0–52.0)
Hemoglobin: 7.8 g/dL — ABNORMAL LOW (ref 13.0–17.0)

## 2024-01-23 LAB — FERRITIN: Ferritin: 882 ng/mL — ABNORMAL HIGH (ref 24–336)

## 2024-01-23 LAB — FOLATE: Folate: 7.9 ng/mL

## 2024-01-23 LAB — VITAMIN B12: Vitamin B-12: 1698 pg/mL — ABNORMAL HIGH (ref 180–914)

## 2024-01-23 MED ORDER — DONEPEZIL HCL 5 MG PO TABS
10.0000 mg | ORAL_TABLET | Freq: Every day | ORAL | Status: DC
  Administered 2024-01-23 – 2024-02-02 (×11): 10 mg via ORAL
  Filled 2024-01-23 (×12): qty 2

## 2024-01-23 MED ORDER — FINASTERIDE 5 MG PO TABS
5.0000 mg | ORAL_TABLET | Freq: Every day | ORAL | Status: DC
  Administered 2024-01-23 – 2024-02-03 (×12): 5 mg via ORAL
  Filled 2024-01-23 (×12): qty 1

## 2024-01-23 MED ORDER — AMIODARONE HCL 200 MG PO TABS
200.0000 mg | ORAL_TABLET | Freq: Every day | ORAL | Status: DC
  Filled 2024-01-23: qty 1

## 2024-01-23 MED ORDER — MORPHINE SULFATE (PF) 2 MG/ML IV SOLN
2.0000 mg | INTRAVENOUS | Status: DC | PRN
  Administered 2024-01-26: 2 mg via INTRAVENOUS
  Filled 2024-01-23: qty 1

## 2024-01-23 MED ORDER — INSULIN ASPART 100 UNIT/ML IJ SOLN
0.0000 [IU] | Freq: Three times a day (TID) | INTRAMUSCULAR | Status: DC
  Administered 2024-01-24: 3 [IU] via SUBCUTANEOUS
  Administered 2024-01-25 – 2024-01-29 (×8): 2 [IU] via SUBCUTANEOUS
  Administered 2024-01-29: 5 [IU] via SUBCUTANEOUS
  Administered 2024-01-29 – 2024-01-31 (×6): 3 [IU] via SUBCUTANEOUS
  Administered 2024-01-31: 2 [IU] via SUBCUTANEOUS
  Administered 2024-02-01: 3 [IU] via SUBCUTANEOUS
  Administered 2024-02-01: 2 [IU] via SUBCUTANEOUS
  Administered 2024-02-01: 3 [IU] via SUBCUTANEOUS
  Administered 2024-02-03 (×2): 2 [IU] via SUBCUTANEOUS
  Filled 2024-01-23: qty 2
  Filled 2024-01-23: qty 3
  Filled 2024-01-23: qty 2
  Filled 2024-01-23: qty 3
  Filled 2024-01-23: qty 2
  Filled 2024-01-23: qty 3
  Filled 2024-01-23 (×3): qty 2
  Filled 2024-01-23: qty 3
  Filled 2024-01-23: qty 2
  Filled 2024-01-23: qty 3
  Filled 2024-01-23: qty 5
  Filled 2024-01-23: qty 3
  Filled 2024-01-23: qty 2
  Filled 2024-01-23 (×2): qty 3
  Filled 2024-01-23 (×3): qty 2
  Filled 2024-01-23: qty 3
  Filled 2024-01-23: qty 2

## 2024-01-23 MED ORDER — MIDODRINE HCL 5 MG PO TABS
10.0000 mg | ORAL_TABLET | Freq: Three times a day (TID) | ORAL | Status: DC
  Administered 2024-01-24 – 2024-01-29 (×17): 10 mg via ORAL
  Filled 2024-01-23 (×18): qty 2

## 2024-01-23 MED ORDER — LIDOCAINE HCL (PF) 1 % IJ SOLN: 5.0000 mL | INTRAMUSCULAR | Status: DC | PRN

## 2024-01-23 MED ORDER — EPOETIN ALFA-EPBX 10000 UNIT/ML IJ SOLN
10000.0000 [IU] | Freq: Once | INTRAMUSCULAR | Status: DC
  Filled 2024-01-23: qty 1

## 2024-01-23 MED ORDER — LIDOCAINE-PRILOCAINE 2.5-2.5 % EX CREA: 1.0000 | TOPICAL_CREAM | CUTANEOUS | Status: DC | PRN

## 2024-01-23 MED ORDER — TRAZODONE HCL 50 MG PO TABS
25.0000 mg | ORAL_TABLET | Freq: Every evening | ORAL | Status: DC | PRN
  Administered 2024-01-28 – 2024-02-02 (×3): 25 mg via ORAL
  Filled 2024-01-23 (×3): qty 1

## 2024-01-23 MED ORDER — ONDANSETRON HCL 4 MG PO TABS
4.0000 mg | ORAL_TABLET | Freq: Four times a day (QID) | ORAL | Status: DC | PRN
  Administered 2024-02-01 – 2024-02-02 (×2): 4 mg via ORAL
  Filled 2024-01-23 (×2): qty 1

## 2024-01-23 MED ORDER — ACETAMINOPHEN 650 MG RE SUPP: 650.0000 mg | Freq: Four times a day (QID) | RECTAL | Status: DC | PRN

## 2024-01-23 MED ORDER — AMIODARONE HCL 200 MG PO TABS
100.0000 mg | ORAL_TABLET | Freq: Every day | ORAL | Status: DC
  Administered 2024-01-24 – 2024-02-03 (×11): 100 mg via ORAL
  Filled 2024-01-23 (×11): qty 1

## 2024-01-23 MED ORDER — NOREPINEPHRINE 4 MG/250ML-% IV SOLN
0.0000 ug/min | INTRAVENOUS | Status: DC
  Administered 2024-01-23: 5 ug/min via INTRAVENOUS
  Administered 2024-01-24 (×2): 7 ug/min via INTRAVENOUS
  Administered 2024-01-25: 5 ug/min via INTRAVENOUS
  Filled 2024-01-23 (×4): qty 250

## 2024-01-23 MED ORDER — NITROGLYCERIN 0.4 MG SL SUBL
0.4000 mg | SUBLINGUAL_TABLET | SUBLINGUAL | Status: DC | PRN
  Filled 2024-01-23: qty 1

## 2024-01-23 MED ORDER — HEPARIN (PORCINE) 25000 UT/250ML-% IV SOLN
800.0000 [IU]/h | INTRAVENOUS | Status: DC
  Administered 2024-01-23: 800 [IU]/h via INTRAVENOUS
  Administered 2024-01-24: 1150 [IU]/h via INTRAVENOUS
  Filled 2024-01-23 (×2): qty 250

## 2024-01-23 MED ORDER — SODIUM CHLORIDE 0.9 % IV SOLN: Freq: Once | INTRAVENOUS | Status: AC

## 2024-01-23 MED ORDER — ONDANSETRON HCL 4 MG/2ML IJ SOLN
4.0000 mg | Freq: Four times a day (QID) | INTRAMUSCULAR | Status: DC | PRN
  Administered 2024-01-26 – 2024-02-03 (×4): 4 mg via INTRAVENOUS
  Filled 2024-01-23 (×4): qty 2

## 2024-01-23 MED ORDER — MAGNESIUM HYDROXIDE 400 MG/5ML PO SUSP
30.0000 mL | Freq: Every day | ORAL | Status: DC | PRN
  Administered 2024-01-30: 30 mL via ORAL
  Filled 2024-01-23: qty 30

## 2024-01-23 MED ORDER — INSULIN ASPART 100 UNIT/ML IJ SOLN
0.0000 [IU] | Freq: Every day | INTRAMUSCULAR | Status: DC
  Filled 2024-01-23: qty 2

## 2024-01-23 MED ORDER — DOCUSATE SODIUM 100 MG PO CAPS
100.0000 mg | ORAL_CAPSULE | Freq: Every day | ORAL | Status: DC
  Administered 2024-01-24 – 2024-01-29 (×3): 100 mg via ORAL
  Filled 2024-01-23 (×5): qty 1

## 2024-01-23 MED ORDER — ATORVASTATIN CALCIUM 80 MG PO TABS
80.0000 mg | ORAL_TABLET | Freq: Every day | ORAL | Status: DC
  Administered 2024-01-24: 80 mg via ORAL
  Filled 2024-01-23: qty 4
  Filled 2024-01-23: qty 1

## 2024-01-23 MED ORDER — OSELTAMIVIR PHOSPHATE 30 MG PO CAPS
30.0000 mg | ORAL_CAPSULE | ORAL | Status: AC
  Administered 2024-01-24 – 2024-01-26 (×2): 30 mg via ORAL
  Filled 2024-01-23 (×2): qty 1

## 2024-01-23 MED ORDER — SODIUM CHLORIDE 0.9 % IV BOLUS
500.0000 mL | Freq: Once | INTRAVENOUS | Status: AC
  Administered 2024-01-23: 500 mL via INTRAVENOUS

## 2024-01-23 MED ORDER — ALPRAZOLAM 0.5 MG PO TABS
0.5000 mg | ORAL_TABLET | Freq: Three times a day (TID) | ORAL | Status: DC | PRN
  Administered 2024-01-23 – 2024-02-03 (×9): 0.5 mg via ORAL
  Filled 2024-01-23 (×10): qty 1

## 2024-01-23 MED ORDER — TAMSULOSIN HCL 0.4 MG PO CAPS
0.4000 mg | ORAL_CAPSULE | Freq: Every day | ORAL | Status: DC
  Administered 2024-01-23 – 2024-02-01 (×10): 0.4 mg via ORAL
  Filled 2024-01-23 (×10): qty 1

## 2024-01-23 MED ORDER — DICLOFENAC SODIUM 1 % EX GEL
2.0000 g | Freq: Four times a day (QID) | CUTANEOUS | Status: DC
  Administered 2024-01-23 – 2024-01-29 (×22): 2 g via TOPICAL
  Filled 2024-01-23 (×2): qty 100

## 2024-01-23 MED ORDER — OSELTAMIVIR PHOSPHATE 30 MG PO CAPS
30.0000 mg | ORAL_CAPSULE | Freq: Once | ORAL | Status: AC
  Administered 2024-01-23: 30 mg via ORAL
  Filled 2024-01-23: qty 1

## 2024-01-23 MED ORDER — VITAMIN D 25 MCG (1000 UNIT) PO TABS
5000.0000 [IU] | ORAL_TABLET | Freq: Every day | ORAL | Status: DC
  Administered 2024-01-23 – 2024-02-03 (×12): 5000 [IU] via ORAL
  Filled 2024-01-23 (×12): qty 5

## 2024-01-23 MED ORDER — SODIUM CHLORIDE 0.9 % IV BOLUS: 250.0000 mL | Freq: Once | INTRAVENOUS | Status: DC

## 2024-01-23 MED ORDER — APIXABAN 2.5 MG PO TABS
2.5000 mg | ORAL_TABLET | Freq: Two times a day (BID) | ORAL | Status: DC
  Administered 2024-01-23: 2.5 mg via ORAL
  Filled 2024-01-23: qty 1

## 2024-01-23 MED ORDER — METOPROLOL SUCCINATE ER 25 MG PO TB24
12.5000 mg | ORAL_TABLET | Freq: Every day | ORAL | Status: DC
  Administered 2024-01-23: 12.5 mg via ORAL
  Filled 2024-01-23: qty 1
  Filled 2024-01-23: qty 0.5

## 2024-01-23 MED ORDER — ACETAMINOPHEN 325 MG PO TABS
650.0000 mg | ORAL_TABLET | Freq: Four times a day (QID) | ORAL | Status: DC | PRN
  Administered 2024-01-23 – 2024-01-28 (×2): 650 mg via ORAL
  Filled 2024-01-23 (×2): qty 2

## 2024-01-23 MED ORDER — PENTAFLUOROPROP-TETRAFLUOROETH EX AERO: 1.0000 | INHALATION_SPRAY | CUTANEOUS | Status: DC | PRN

## 2024-01-23 MED ORDER — HEPARIN (PORCINE) 25000 UT/250ML-% IV SOLN: 10.0000 [IU]/kg/h | INTRAVENOUS | Status: DC

## 2024-01-23 MED ORDER — ASPIRIN 81 MG PO TBEC
81.0000 mg | DELAYED_RELEASE_TABLET | Freq: Every day | ORAL | Status: DC
  Administered 2024-01-23: 81 mg via ORAL
  Filled 2024-01-23: qty 1

## 2024-01-23 MED ORDER — HEPARIN (PORCINE) 25000 UT/250ML-% IV SOLN: 850.0000 [IU]/h | INTRAVENOUS | Status: DC

## 2024-01-23 NOTE — Consult Note (Signed)
 " CARDIOLOGY CONSULT NOTE               Patient ID: PEDROHENRIQUE MCCONVILLE MRN: [id number] DOB/AGE: 85-May-1942 85 y.o.  Admit date: 01/22/2024 Referring Physician Dr. Madison Hotter hospitalist Primary Physician Dr. Alm Needle primary Primary Cardiologist [facility] Reason for Consultation shortness of breath heart failure  HPI: 84 year old male history of dementia hypertension diabetes end-stage renal disease on dialysis Monday Wednesday Friday patient reportedly developed shortness of breath diagnosed with influenza A treated with Tamiflu  symptoms got progressively worse he came to the emergency room was found to have elevated BNP over 35,000.  Patient has underlying atrial fibrillation treated with amiodarone  Eliquis  metoprolol  was found to be severely anemic hemoglobin of 4.5 requiring transfusion he also complained of some chest pain symptoms patient had acute dyspnea chest tightness felt indigestion.  Patient complained of dry cough without wheezing.  Patient is found have elevated troponins over 600.  Review of systems complete and found to be negative unless listed above     Past Medical History:  Diagnosis Date   Anxiety    Arthritis    Depression    Diabetes mellitus without complication (HCC)    End stage renal disease (HCC)    Hypertension    Pneumonia    Renal disorder     Past Surgical History:  Procedure Laterality Date   A/V FISTULAGRAM Left 06/08/2023   Procedure: A/V Fistulagram;  Surgeon: Jama Cordella MATSU, MD;  Location: [facility] INVASIVE CV LAB;  Service: Cardiovascular;  Laterality: Left;   A/V SHUNT INTERVENTION Left 09/29/2023   Procedure: A/V SHUNT INTERVENTION;  Surgeon: Marea Selinda RAMAN, MD;  Location: [facility] INVASIVE CV [facility];  Service: Cardiovascular;  Laterality: Left;   APPLICATION OF WOUND VAC Right 11/14/2021   Procedure: APPLICATION OF WOUND VAC;  Surgeon: Jama Cordella MATSU, MD;  Location: [facility] ORS;  Service: Vascular;  Laterality: Right;   AV FISTULA PLACEMENT  Left 11/14/2021   Procedure: ARTERIOVENOUS (AV) FISTULA CREATION ( BRACHIAL CEPHALIC);  Surgeon: Jama Cordella MATSU, MD;  Location: [facility] ORS;  Service: Vascular;  Laterality: Left;    (Not in a hospital admission)  Social History   Socioeconomic History   Marital status: Married    Spouse name: ,Loraine   Number of children: Not on file   Years of education: Not on file   Highest education level: Not on file  Occupational History   Not on file  Tobacco Use   Smoking status: Never   Smokeless tobacco: Never  Vaping Use   Vaping status: Never Used  Substance and Sexual Activity   Alcohol use: No   Drug use: Not Currently   Sexual activity: Not Currently  Other Topics Concern   Not on file  Social History Narrative   Not on file   Social Drivers of Health   Tobacco Use: Low Risk (01/22/2024)   Patient History    Smoking Tobacco Use: Never    Smokeless Tobacco Use: Never    Passive Exposure: Not on file  Recent Concern: Tobacco Use - Medium Risk (12/01/2023)   Received from [facility]   Patient History    Passive Exposure: Not on file    Smokeless Tobacco Use: Never    Smoking Tobacco Use: Former  Physicist, Medical Strain: Low Risk  (12/01/2023)   Received from [facility]   Overall Financial Resource Strain (CARDIA)    Difficulty of Paying Living Expenses: Not hard at all  Food Insecurity: No Food  Insecurity (12/01/2023)   Received from [facility]   Epic    Within the past 12 months, you worried that your food would run out before you got the money to buy more.: Never true    Within the past 12 months, the food you bought just didn't last and you didn't have money to get more.: Never true  Transportation Needs: No Transportation Needs (12/01/2023)   Received from [facility] - Transportation    In the past 12 months, has lack of transportation kept you from medical appointments or  from getting medications?: No    Lack of Transportation (Non-Medical): No  Physical Activity: Not on file  Stress: Not on file  Social Connections: Socially Integrated (07/18/2023)   Social Connection and Isolation Panel    Frequency of Communication with Friends and Family: More than three times a week    Frequency of Social Gatherings with Friends and Family: More than three times a week    Attends Religious Services: More than 4 times per year    Active Member of [employer] or Organizations: Yes    Attends Banker Meetings: More than 4 times per year    Marital Status: Married  Catering Manager Violence: Not At Risk (07/18/2023)   Epic    Fear of Current or Ex-Partner: No    Emotionally Abused: No    Physically Abused: No    Sexually Abused: No  Depression (PHQ2-9): Not on file  Alcohol Screen: Not on file  Housing: Low Risk  (12/01/2023)   Received from [facility]   Epic    At any time in the past 12 months, were you homeless or living in a shelter (including now)?: No    In the past 12 months, how many times have you moved where you were living?: 0    In the last 12 months, was there a time when you were not able to pay the mortgage or rent on time?: No  Utilities: Not At Risk (12/01/2023)   Received from [facility]   Epic    In the past 12 months has the electric, gas, oil, or water company threatened to shut off services in your home?: No  Health Literacy: Not on file    History reviewed. No pertinent family history.    Review of systems complete and found to be negative unless listed above      PHYSICAL EXAM  General: Well developed, well nourished, in no acute distress HEENT:  Normocephalic and atramatic Neck:  No JVD.  Lungs: Clear bilaterally to auscultation and percussion. Heart: Irregular irregular. Normal S1 and S2 without gallops or murmurs.  Abdomen: Bowel sounds are positive, abdomen soft and non-tender  Msk:   Back normal, normal gait. Normal strength and tone for age. Extremities: No clubbing, cyanosis or edema.   Neuro: Alert partly oriented lethargy confusion disorientation. Psych:  Good affect, responds appropriately  Labs:   Lab Results  Component Value Date   WBC 5.8 01/23/2024   HGB 7.6 (L) 01/23/2024   HCT 15.3 (L) 01/23/2024   MCV 82.3 01/23/2024   PLT 117 (L) 01/23/2024    Recent Labs  Lab 01/22/24 1745 01/23/24 0323  NA 134* 133*  K 3.5 3.4*  CL 94* 97*  CO2 27 23  BUN 46* 47*  CREATININE 4.70* 4.68*  CALCIUM  8.4* 8.0*  PROT 7.6  --   BILITOT 0.5  --  ALKPHOS 86  --   ALT 70*  --   AST 73*  --   GLUCOSE 134* 105*   No results found for: CKTOTAL, CKMB, CKMBINDEX, TROPONINI No results found for: CHOL No results found for: HDL No results found for: LDLCALC No results found for: TRIG No results found for: CHOLHDL No results found for: LDLDIRECT    Radiology: CT Chest Wo Contrast Result Date: 01/22/2024 CLINICAL DATA:  Respiratory illness, nondiagnostic xray, chest pressure, short of breath EXAM: CT CHEST WITHOUT CONTRAST TECHNIQUE: Multidetector CT imaging of the chest was performed following the standard protocol without IV contrast. RADIATION DOSE REDUCTION: This exam was performed according to the departmental dose-optimization program which includes automated exposure control, adjustment of the mA and/or kV according to patient size and/or use of iterative reconstruction technique. COMPARISON:  01/22/2024, 09/07/2023 FINDINGS: Cardiovascular: Unenhanced imaging of the heart demonstrates cardiomegaly without pericardial effusion. Normal caliber of the thoracic aorta. Atherosclerosis of the aorta and coronary vasculature. Assessment of the vascular lumen cannot be performed without intravenous contrast. Mediastinum/Nodes: No enlarged mediastinal or axillary lymph nodes. Thyroid gland, trachea, and esophagus demonstrate no significant findings.  Lungs/Pleura: No acute airspace disease, effusion, or pneumothorax. Dependent hypoventilatory changes are noted. Central airways are patent. Upper Abdomen: Since the prior abdominal CT 09/07/2023, there has been development of borderline splenomegaly measuring 12.7 x 11.9 x 6.8 cm. Within the superolateral aspect of the spleen there is a new indeterminate 6.1 x 6.8 x 4.6 cm hypodensity. Musculoskeletal: No acute or destructive bony abnormalities. Reconstructed images demonstrate no additional findings. IMPRESSION: 1. Cardiomegaly.  No pericardial effusion. 2. No acute airspace disease. 3. Borderline splenomegaly and indeterminate 6.8 cm splenic hypodensity, which have developed in the interim since the 09/07/2023 CT. This is incompletely characterized on this exam, and if further evaluation is clinically indicated a contrasted CT or MRI of the abdomen could be considered. 4. Aortic Atherosclerosis (ICD10-I70.0). Coronary artery atherosclerosis. Electronically Signed   By: Ozell Daring M.D.   On: 01/22/2024 20:00   DG Chest 2 View Result Date: 01/22/2024 CLINICAL DATA:  Chest pain and shortness of breath. EXAM: CHEST - 2 VIEW COMPARISON:  07/19/2023. FINDINGS: The heart is enlarged and the mediastinal contour is within normal limits. Atherosclerotic calcification of the aorta is noted. Mild atelectasis or scarring is present at the left lung base. No consolidation, effusion, or pneumothorax is seen. No acute osseous abnormality. A vascular stent is noted in the axillary region on the left. IMPRESSION: No active cardiopulmonary disease. Electronically Signed   By: Leita Birmingham M.D.   On: 01/22/2024 18:17   VAS US  DUPLEX DIALYSIS ACCESS (AVF, AVG) Result Date: 01/19/2024 DIALYSIS ACCESS Patient Name:  BEVERLEY SHERRARD  Date of Exam:   01/18/2024 Medical Rec #: [id number]      Accession #:    [id number] Date of Birth: 01-Oct-1940      Patient Gender: M Patient Age:   63 years Exam Location:  [facility]  Procedure:      VAS US  DUPLEX DIALYSIS ACCESS (AVF, AVG) Referring Phys: SELINDA GU --------------------------------------------------------------------------------  Reason for Exam: Routine follow up. Access Site: Left Upper Extremity. Access Type: Brachial-cephalic AVF. History: 2023: Left brachial-cephalic AVF created;          06/08/23: Stent placed at cephalic / subclavian confluence;          09/2023: mid upper arm PTA and stent;. Performing Technologist: Elsie Churn RT, RDMS, RVT  Examination Guidelines: A complete evaluation includes B-mode  imaging, spectral Doppler, color Doppler, and power Doppler as needed of all accessible portions of each vessel. Unilateral testing is considered an integral part of a complete examination. Limited examinations for reoccurring indications may be performed as noted.  Findings: +--------------------+----------+-----------------+--------+ AVF                 PSV (cm/s)Flow Vol (mL/min)Comments +--------------------+----------+-----------------+--------+ Native artery inflow   196          1496                +--------------------+----------+-----------------+--------+ AVF Anastomosis        418                              +--------------------+----------+-----------------+--------+  +---------------+----------+-------------+----------+-----------------------+ OUTFLOW VEIN   PSV (cm/s)Diameter (cm)Depth (cm)       Describe         +---------------+----------+-------------+----------+-----------------------+ Subclavian vein   194                                                   +---------------+----------+-------------+----------+-----------------------+ Confluence        129                                    stent          +---------------+----------+-------------+----------+-----------------------+ Clavicle          139                                    stent           +---------------+----------+-------------+----------+-----------------------+ Shoulder          152                                    stent          +---------------+----------+-------------+----------+-----------------------+ Prox UA           133                                    stent          +---------------+----------+-------------+----------+-----------------------+ Mid UA            150                                    stent          +---------------+----------+-------------+----------+-----------------------+ Dist UA           325                           possible retained valve +---------------+----------+-------------+----------+-----------------------+ AC Fossa          119                                                   +---------------+----------+-------------+----------+-----------------------+  Summary: Patent left brachial-cephalic AVF with no focal hemodynamically significant velocity increases or internal vessel narrowing. Normal Flow Volume noted.  *See table(s) above for measurements and observations.  Diagnosing physician: Selinda Gu MD Electronically signed by Selinda Gu MD on 01/19/2024 at 7:24:41 AM.  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 Plan Agree with admit to telemetry follow-up EKGs troponins and telemetry Chronic atrial fibrillation continue anticoagulation with Eliquis  metoprolol  for rate amiodarone  for rhythm Discontinue aspirin  and Plavix  because of GI bleeding Diastolic congestive heart failure EF around 55 to 60% Continue  diuretics for diuresis Agree with dialysis management for end-stage renal disease recommend aggressive dialysis for heart failure management Diabetes type 2 continue glipizide  consider SGLT2 Consider spironolactone  for diastolic heart failure Continue current therapy for acute influenza A ,URI infection Inhalers as necessary for shortness of breath dyspnea Agree with GI evaluation for anemia maintain hemoglobin above 8 Consider cardiac MRI for evaluation of infiltrative process Conservative cardiac management at this point      Signed: Cara JONETTA Lovelace MD 01/23/2024, 12:24 PM      "

## 2024-01-23 NOTE — ED Notes (Addendum)
 Pt's POA expressed to this RN that pt takes 100mg  amiodarone  instead of 200 mg, MD made aware.

## 2024-01-23 NOTE — Progress Notes (Signed)
 ANTICOAGULATION CONSULT NOTE  Pharmacy Consult for heparin  infusion Indication: ACS/STEMI  Allergies[1]  Patient Measurements: Height: 6' 5 (195.6 cm) Weight: 63.5 kg (140 lb) IBW/kg (Calculated) : 89.1 HEPARIN  DW (KG): 63.5  Vital Signs: Temp: 97.8 F (36.6 C) (01/11 0300) Temp Source: Oral (01/11 0300) BP: 101/49 (01/11 0300) Pulse Rate: 108 (01/11 0300)  Labs: Recent Labs    01/22/24 1745  HGB 8.0*  HCT 25.1*  PLT 209  CREATININE 4.70*    Estimated Creatinine Clearance: 10.5 mL/min (A) (by C-G formula based on SCr of 4.7 mg/dL (H)).   Medical History: Past Medical History:  Diagnosis Date   Anxiety    Arthritis    Depression    Diabetes mellitus without complication (HCC)    End stage renal disease (HCC)    Hypertension    Pneumonia    Renal disorder     Medications:  PTA Meds: Apixaban  2.5 mg BID, last dose 1/11 @ 0122.  Assessment: Pt is a 84 yo male presenting to ED for chest pain/pressure and SOB since last few days, found with elevated BNP and Troponin levels  Goal of Therapy:  Heparin  level 0.3-0.7 units/ml aPTT 66-102 seconds Monitor platelets by anticoagulation protocol: Yes   Plan:  No initials bolus Start heparin  infusion at 850 units/hr at 1200. Will follow aPTT until correlation w/ HL confirmed Will check aPTT in 8 hr after start of infusion HL & CBC daily while on heparin   Rankin CANDIE Dills, PharmD, [facility] 01/23/2024 3:32 AM      [1] No Known Allergies

## 2024-01-23 NOTE — Consult Note (Signed)
 "   [facility] GI Inpatient Consult Note   Jeffery Joseph, M.D.  Reason for Consult: Drop in hemoglobin, progressive anemia.   Attending Requesting Consult: Jeffery Joseph, D.O.  Outpatient Primary Physician: Jeffery Joseph, M.D.  History of Present Illness: Jeffery Joseph is a 84 y.o. male with a history of BPH, Dementia, ESRD on HD M/W/F, HTN, depression and Type II DM presenting to ED yesterday with chest tightness, SOB, cough unresponsive to TAMIFLU . He was also noted to have atrial fibrillation on EKG with HR 111. GI service was called secondary to spuriously depressed Hgb to 4.9, repeat was 7.6. Hgb has declined from around 11 in Jan 2025 to 8.0  on current hospitalization.  Anemia is normocytic, however. Heparin  IV was started d/t initial RVR afib, but has been held since noting of depressed Hgb without overt  melena, hemetemesis or hematochezia.   Patient's youngest daughter, Jeffery Joseph, is present to help with history and family wishes. She reports that endoscopy will be declined d/t family wishes and anesthesia risk. Patient's POA is an endoscopy nurse who works at [employer] (Jeffery Joseph, [employer]) and has reportedly requested for blood thinners to continue if medically feasible.  Past Medical History:  Past Medical History:  Diagnosis Date   Anxiety    Arthritis    Depression    Diabetes mellitus without complication (HCC)    End stage renal disease (HCC)    Hypertension    Pneumonia    Renal disorder     Problem List: Patient Active Problem List   Diagnosis Date Noted   Chronic atrial fibrillation with RVR (HCC) 01/23/2024   Type 2 diabetes mellitus with chronic kidney disease, without long-term current use of insulin  (HCC) 01/23/2024   BPH (benign prostatic hyperplasia) 01/23/2024   End-stage renal disease on hemodialysis (HCC) 01/23/2024   Influenza A 01/23/2024   Chest pain 01/22/2024   Elevated troponin 07/18/2023   Hypertensive emergency 07/18/2023   SOB  (shortness of breath) 07/18/2023   Hypokalemia 07/18/2023   Acute pulmonary edema (HCC) 07/17/2023   Type 2 diabetes mellitus (HCC) 05/30/2023   End stage renal disease (HCC) 11/14/2021   Benign prostatic hyperplasia with lower urinary tract symptoms 08/12/2019   Benign essential hypertension 10/27/2018    Past Surgical History: Past Surgical History:  Procedure Laterality Date   A/V FISTULAGRAM Left 06/08/2023   Procedure: A/V Fistulagram;  Surgeon: Jama Cordella MATSU, MD;  Location: [facility] INVASIVE CV LAB;  Service: Cardiovascular;  Laterality: Left;   A/V SHUNT INTERVENTION Left 09/29/2023   Procedure: A/V SHUNT INTERVENTION;  Surgeon: Marea Selinda RAMAN, MD;  Location: [facility] INVASIVE CV LAB;  Service: Cardiovascular;  Laterality: Left;   APPLICATION OF WOUND VAC Right 11/14/2021   Procedure: APPLICATION OF WOUND VAC;  Surgeon: Jama Cordella MATSU, MD;  Location: [facility] ORS;  Service: Vascular;  Laterality: Right;   AV FISTULA PLACEMENT Left 11/14/2021   Procedure: ARTERIOVENOUS (AV) FISTULA CREATION ( BRACHIAL CEPHALIC);  Surgeon: Jama Cordella MATSU, MD;  Location: [facility] ORS;  Service: Vascular;  Laterality: Left;    Allergies: Allergies[1]  Home Medications: (Not in a hospital admission)  Home medication reconciliation was completed with the patient.   Scheduled Inpatient Medications:    [START ON 01/24/2024] amiodarone   100 mg Oral Daily   aspirin  EC  81 mg Oral Daily   atorvastatin   80 mg Oral Daily   cholecalciferol   5,000 Units Oral Daily   diclofenac  Sodium  2 g Topical QID   docusate sodium   100 mg Oral QHS   donepezil   10 mg Oral QHS   finasteride   5 mg Oral Daily   insulin  aspart  0-15 Units Subcutaneous TID WC   insulin  aspart  0-5 Units Subcutaneous QHS   metoprolol  succinate  12.5 mg Oral Daily   [START ON 01/24/2024] oseltamivir   30 mg Oral Q M,W,F-1800   tamsulosin   0.4 mg Oral Daily    Continuous Inpatient Infusions:    PRN Inpatient Medications:  acetaminophen  **OR**  acetaminophen , ALPRAZolam , magnesium  hydroxide, morphine  injection, nitroGLYCERIN , ondansetron  **OR** ondansetron  (ZOFRAN ) IV, traZODone   Family History: family history is not on file.   GI Family History: negative  Social History:   reports that he has never smoked. He has never used smokeless tobacco. He reports that he does not currently use drugs. He reports that he does not drink alcohol. The patient denies ETOH, tobacco, or drug use.    Review of Systems: Review of Systems - Negative except HPI  Physical Examination: BP (!) 106/50 (BP Location: Right Arm)   Pulse (!) 109   Temp 98.3 F (36.8 C) (Oral)   Resp (!) 24   Ht 6' 5 (1.956 m)   Wt 63.5 kg   SpO2 98%   BMI 16.60 kg/m  Physical Exam Constitutional:      General: He is not in acute distress.    Appearance: He is well-developed. He is ill-appearing. He is not toxic-appearing or diaphoretic.  HENT:     Head: Normocephalic and atraumatic.     Mouth/Throat:     Pharynx: Oropharynx is clear.  Eyes:     Pupils: Pupils are equal, round, and reactive to light.  Neck:     Vascular: No JVD.     Trachea: No tracheal deviation.  Cardiovascular:     Rate and Rhythm: Rhythm irregular. No extrasystoles are present.    Heart sounds: No murmur heard.    No gallop.  Pulmonary:     Effort: Pulmonary effort is normal.     Breath sounds: Decreased breath sounds present. No wheezing, rhonchi or rales.  Chest:     Chest wall: No mass, deformity, tenderness or crepitus.  Abdominal:     General: Bowel sounds are normal.     Palpations: Abdomen is soft. There is no hepatomegaly or splenomegaly.     Tenderness: There is no abdominal tenderness.  Musculoskeletal:     Cervical back: Normal range of motion.     Right lower leg: No edema.     Left lower leg: No edema.  Skin:    General: Skin is warm and dry.  Neurological:     Mental Status: He is disoriented.  Psychiatric:        Mood and Affect: Mood normal. Mood is not  anxious.        Behavior: Behavior is not agitated.     Data: Lab Results  Component Value Date   WBC 5.8 01/23/2024   HGB 7.6 (L) 01/23/2024   HCT 15.3 (L) 01/23/2024   MCV 82.3 01/23/2024   PLT 117 (L) 01/23/2024   Recent Labs  Lab 01/22/24 1745 01/23/24 0430 01/23/24 0826  HGB 8.0* 4.5* 7.6*   Lab Results  Component Value Date   NA 133 (L) 01/23/2024   K 3.4 (L) 01/23/2024   CL 97 (L) 01/23/2024   CO2 23 01/23/2024   BUN 47 (H) 01/23/2024   CREATININE 4.68 (H) 01/23/2024   Lab Results  Component Value Date   ALT 70 (  H) 01/22/2024   AST 73 (H) 01/22/2024   ALKPHOS 86 01/22/2024   BILITOT 0.5 01/22/2024   Recent Labs  Lab 01/23/24 0343  APTT 48*  INR 2.1*      Latest Ref Rng & Units 01/23/2024    8:26 AM 01/23/2024    4:30 AM 01/22/2024    5:45 PM  CBC  WBC 4.0 - 10.5 K/uL  5.8  11.8   Hemoglobin 13.0 - 17.0 g/dL 7.6  4.5  8.0   Hematocrit 39.0 - 52.0 %  15.3  25.1   Platelets 150 - 400 K/uL  117  209     STUDIES: CT Chest Wo Contrast Result Date: 01/22/2024 CLINICAL DATA:  Respiratory illness, nondiagnostic xray, chest pressure, short of breath EXAM: CT CHEST WITHOUT CONTRAST TECHNIQUE: Multidetector CT imaging of the chest was performed following the standard protocol without IV contrast. RADIATION DOSE REDUCTION: This exam was performed according to the departmental dose-optimization program which includes automated exposure control, adjustment of the mA and/or kV according to patient size and/or use of iterative reconstruction technique. COMPARISON:  01/22/2024, 09/07/2023 FINDINGS: Cardiovascular: Unenhanced imaging of the heart demonstrates cardiomegaly without pericardial effusion. Normal caliber of the thoracic aorta. Atherosclerosis of the aorta and coronary vasculature. Assessment of the vascular lumen cannot be performed without intravenous contrast. Mediastinum/Nodes: No enlarged mediastinal or axillary lymph nodes. Thyroid gland, trachea, and  esophagus demonstrate no significant findings. Lungs/Pleura: No acute airspace disease, effusion, or pneumothorax. Dependent hypoventilatory changes are noted. Central airways are patent. Upper Abdomen: Since the prior abdominal CT 09/07/2023, there has been development of borderline splenomegaly measuring 12.7 x 11.9 x 6.8 cm. Within the superolateral aspect of the spleen there is a new indeterminate 6.1 x 6.8 x 4.6 cm hypodensity. Musculoskeletal: No acute or destructive bony abnormalities. Reconstructed images demonstrate no additional findings. IMPRESSION: 1. Cardiomegaly.  No pericardial effusion. 2. No acute airspace disease. 3. Borderline splenomegaly and indeterminate 6.8 cm splenic hypodensity, which have developed in the interim since the 09/07/2023 CT. This is incompletely characterized on this exam, and if further evaluation is clinically indicated a contrasted CT or MRI of the abdomen could be considered. 4. Aortic Atherosclerosis (ICD10-I70.0). Coronary artery atherosclerosis. Electronically Signed   By: Ozell Daring M.D.   On: 01/22/2024 20:00   DG Chest 2 View Result Date: 01/22/2024 CLINICAL DATA:  Chest pain and shortness of breath. EXAM: CHEST - 2 VIEW COMPARISON:  07/19/2023. FINDINGS: The heart is enlarged and the mediastinal contour is within normal limits. Atherosclerotic calcification of the aorta is noted. Mild atelectasis or scarring is present at the left lung base. No consolidation, effusion, or pneumothorax is seen. No acute osseous abnormality. A vascular stent is noted in the axillary region on the left. IMPRESSION: No active cardiopulmonary disease. Electronically Signed   By: Leita Birmingham M.D.   On: 01/22/2024 18:17   @IMAGES @  Assessment: Principal Problem:   Chest pain Active Problems:   Chronic atrial fibrillation with RVR (HCC)   Type 2 diabetes mellitus with chronic kidney disease, without long-term current use of insulin  (HCC)   BPH (benign prostatic  hyperplasia)   End-stage renal disease on hemodialysis (HCC)   Influenza A 84 year old male with a history of BPH, dementia, end-stage renal disease on hemodialysis presents with shortness of breath, chest pain and cough.  Cardiology consult pending.  Our service was called  with a acute drop to hemoglobin 4.9 suggesting acute GI bleed, subsequently noted to be a lab error. No gross gastrointestinal  bleeding has been noted by family or medical staff.  Anemia possibly related to progressive kidney disease  Recommendations: 1.  Continue current medical treatment. 2.  Resume anticoagulants as previously prescribed. 3.  Further recommendations on cardiopulmonary symptoms per cardiology. 4.  GI will sign off.  Call in the interim as needed.  Thank you for the consult. Please call with questions or concerns.  Aundria Jeffery Eck MD [facility] [address] [place], [place] [address] [phone]  01/23/2024 4:06 PM          [1] No Known Allergies  "

## 2024-01-23 NOTE — Assessment & Plan Note (Signed)
-   Will continue amiodarone  and the patient will be on IV heparin . - Will utilize Lopressor  for rate control given the possibility of MI.

## 2024-01-23 NOTE — Assessment & Plan Note (Signed)
-   The patient will be placed on supplemental coverage with NovoLog . - Will continue Glucotrol  XL.

## 2024-01-23 NOTE — Consult Note (Addendum)
 Pharmacy Consult Note - Anticoagulation  Pharmacy Consult for heparin  infusion Indication: chest pain/ACS Allergies[1]  PATIENT MEASUREMENTS: Height: 6' 5 (195.6 cm) Weight: 63.5 kg (140 lb) IBW/kg (Calculated) : 89.1 HEPARIN  DW (KG): 63.5  VITAL SIGNS: Temp: 99.8 F (37.7 C) (01/11 1728) Temp Source: Oral (01/11 1728) BP: 102/46 (01/11 1728) Pulse Rate: 91 (01/11 1728)  Recent Labs    01/23/24 0323 01/23/24 0343 01/23/24 0430 01/23/24 0826  HGB  --   --  4.5* 7.6*  HCT  --   --  15.3*  --   PLT  --   --  117*  --   APTT  --  48*  --   --   LABPROT  --  24.6*  --   --   INR  --  2.1*  --   --   CREATININE 4.68*  --   --   --     Estimated Creatinine Clearance: 10.6 mL/min (A) (by C-G formula based on SCr of 4.68 mg/dL (H)).  PAST MEDICAL HISTORY: Past Medical History:  Diagnosis Date   Anxiety    Arthritis    Depression    Diabetes mellitus without complication (HCC)    End stage renal disease (HCC)    Hypertension    Pneumonia    Renal disorder     Medications:  (Not in a hospital admission)  Scheduled:   [START ON 01/24/2024] amiodarone   100 mg Oral Daily   atorvastatin   80 mg Oral Daily   cholecalciferol   5,000 Units Oral Daily   diclofenac  Sodium  2 g Topical QID   docusate sodium   100 mg Oral QHS   donepezil   10 mg Oral QHS   [START ON 01/24/2024] epoetin  alfa-epbx (RETACRIT ) injection  10,000 Units Subcutaneous Once   finasteride   5 mg Oral Daily   insulin  aspart  0-15 Units Subcutaneous TID WC   insulin  aspart  0-5 Units Subcutaneous QHS   metoprolol  succinate  12.5 mg Oral Daily   [START ON 01/24/2024] oseltamivir   30 mg Oral Q M,W,F-1800   tamsulosin   0.4 mg Oral Daily   Infusions:   heparin      PRN: acetaminophen  **OR** acetaminophen , ALPRAZolam , [START ON 01/24/2024] lidocaine  (PF), [START ON 01/24/2024] lidocaine -prilocaine , magnesium  hydroxide, morphine  injection, nitroGLYCERIN , ondansetron  **OR** ondansetron  (ZOFRAN ) IV, [START ON  01/24/2024] pentafluoroprop-tetrafluoroeth, traZODone  Anti-infectives (From admission, onward)    Start     Dose/Rate Route Frequency Ordered Stop   01/24/24 1800  oseltamivir  (TAMIFLU ) capsule 30 mg        30 mg Oral Every M-W-F (1800) 01/23/24 0125 01/28/24 1759   01/23/24 0130  oseltamivir  (TAMIFLU ) capsule 30 mg        30 mg Oral  Once 01/23/24 0105 01/23/24 0155       ASSESSMENT: 84 y.o. male with PMH CHF, A.fib, and ESRD on HD is presenting with SOB abd chest pain. Troponin elevated at 695 on admission. Patient is on Apixaban  for a.fib per chart review. Pharmacy has been consulted to initiate and manage heparin  intravenous infusion.  Last dose of Eliquis : 1/10 in the AM  Goal(s) of therapy: Heparin  level 0.3 - 0.7 units/mL aPTT 66 - 102 seconds Monitor platelets by anticoagulation protocol: Yes   Baseline anticoagulation labs: Recent Labs    01/22/24 1745 01/23/24 0343 01/23/24 0430 01/23/24 0826  APTT  --  48*  --   --   INR  --  2.1*  --   --   HGB 8.0*  --  4.5* 7.6*  PLT 209  --  117*  --    Baseline HL/aPTT, INR pending. May be elevated given DOAC PTA   PLAN:  Will omit bolus dose give apixaban  PTA; then start heparin  infusion at 800 units/hour  Check aPTT in 8 hours.  Continue to titrate by aPTT until heparin  level and aPTT correlate, then titrate by heparin  level alone.  Check heparin  level with next AM labs.  Continue to monitor CBC daily while on heparin  infusion.  Annabella LOISE Banks, PharmD Clinical Pharmacist 01/23/2024 6:15 PM      [1] No Known Allergies

## 2024-01-23 NOTE — Assessment & Plan Note (Addendum)
-   The patient has elevated troponin I with concern for non-STEMI though he could be having acute CHF with his ESRD with demand ischemia, especially given elevated BNP. - He will be admitted to a progressive unit bed. - Will place him on IV heparin  for potential ACS to replace Eliquis . - Will follow serial troponins. - He is currently chest pain-free.  He was placed on as needed sublingual nitroglycerin  and morphine  sulfate for chest pain. - Will place him on aspirin  and high-dose statin therapy. - 2D echo and cardiology consult to be obtained. - I notified Dr. Florencio about patient.

## 2024-01-23 NOTE — ED Notes (Signed)
 Attempted to obtain labs, unsuccessful straight stick. Lab called to draw labs.

## 2024-01-23 NOTE — Assessment & Plan Note (Signed)
-   Nephrology consult will be obtained. - I notified Dr. Douglas about the patient. - Continue Lokelma and cholecalciferol .

## 2024-01-23 NOTE — Assessment & Plan Note (Addendum)
-   Will continue Flomax  and Proscar .

## 2024-01-23 NOTE — Assessment & Plan Note (Signed)
-   Will continue his Tamiflu

## 2024-01-23 NOTE — ED Notes (Signed)
 Pt's meriplex pad replaced with new one.

## 2024-01-23 NOTE — Progress Notes (Signed)
 " [facility]  ROUNDING NOTE   Subjective:  Jeffery Joseph, 84 yo male is known to our practice, outpatient dialysis center at [address] And is followed by Dr. Marcelino. Patient recovering from flu last week, last dialysis treatment yesterday. He was complaining of chest tightness and difficulty breathing. In the ED, he was found to have hgB 4.5, elevated troponin but flat. Chest pain appears from ischemic demand.  Seen during rounding, on room air. Family at bedside. Denies pain. Patient and family acknowledges plan for dialysis tomorrow.   Objective:  Vital signs in last 24 hours:  Temp:  [97.6 F (36.4 C)-98.6 F (37 C)] 98.3 F (36.8 C) (01/11 1134) Pulse Rate:  [106-111] 109 (01/11 1300) Resp:  [17-28] 24 (01/11 1300) BP: (74-106)/(45-71) 106/50 (01/11 1300) SpO2:  [96 %-100 %] 98 % (01/11 1300) Weight:  [63.5 kg] 63.5 kg (01/10 1740)  Weight change:  Filed Weights   01/22/24 1740  Weight: 63.5 kg    Intake/Output: I/O last 3 completed shifts: In: 1000 [IV Piggyback:1000] Out: -    Intake/Output this shift:  Total I/O In: -  Out: 325 [Urine:325]  Physical Exam: General: NAD  Head: Normocephalic  Eyes: Anicteric, PERRL  Neck: Supple, trachea midline  Lungs:  Clear to auscultation  Heart: Regular rate and rhythm  Abdomen:  Soft, nontender,   Extremities:  No peripheral edema.  Neurologic: Alert and awake  Skin: No lesions  Access: Left AVF    Basic Metabolic Panel: Recent Labs  Lab 01/22/24 1745 01/23/24 0323  NA 134* 133*  K 3.5 3.4*  CL 94* 97*  CO2 27 23  GLUCOSE 134* 105*  BUN 46* 47*  CREATININE 4.70* 4.68*  CALCIUM  8.4* 8.0*    Liver Function Tests: Recent Labs  Lab 01/22/24 1745  AST 73*  ALT 70*  ALKPHOS 86  BILITOT 0.5  PROT 7.6  ALBUMIN  3.1*   No results for input(s): LIPASE, AMYLASE in the last 168 hours. No results for input(s): AMMONIA in the last 168 hours.  CBC: Recent Labs  Lab 01/22/24 1745  01/23/24 0430 01/23/24 0826  WBC 11.8* 5.8  --   HGB 8.0* 4.5* 7.6*  HCT 25.1* 15.3*  --   MCV 76.5* 82.3  --   PLT 209 117*  --     Cardiac Enzymes: No results for input(s): CKTOTAL, CKMB, CKMBINDEX, TROPONINI in the last 168 hours.  BNP: Invalid input(s): POCBNP  CBG: Recent Labs  Lab 01/23/24 0911 01/23/24 1250  GLUCAP 98 89    Microbiology: Results for orders placed or performed in visit on 08/24/23  Microscopic Examination     Status: Abnormal   Collection Time: 08/24/23 10:16 AM   Urine  Result Value Ref Range Status   WBC, UA 0-5 0 - 5 /hpf Final   RBC, Urine 3-10 (A) 0 - 2 /hpf Final   Epithelial Cells (non renal) 0-10 0 - 10 /hpf Final   Bacteria, UA Few None seen/Few Final    Coagulation Studies: Recent Labs    01/23/24 0343  LABPROT 24.6*  INR 2.1*    Urinalysis: No results for input(s): COLORURINE, LABSPEC, PHURINE, GLUCOSEU, HGBUR, BILIRUBINUR, KETONESUR, PROTEINUR, UROBILINOGEN, NITRITE, LEUKOCYTESUR in the last 72 hours.  Invalid input(s): APPERANCEUR    Imaging: CT Chest Wo Contrast Result Date: 01/22/2024 CLINICAL DATA:  Respiratory illness, nondiagnostic xray, chest pressure, short of breath EXAM: CT CHEST WITHOUT CONTRAST TECHNIQUE: Multidetector CT imaging of the chest was performed following the standard  protocol without IV contrast. RADIATION DOSE REDUCTION: This exam was performed according to the departmental dose-optimization program which includes automated exposure control, adjustment of the mA and/or kV according to patient size and/or use of iterative reconstruction technique. COMPARISON:  01/22/2024, 09/07/2023 FINDINGS: Cardiovascular: Unenhanced imaging of the heart demonstrates cardiomegaly without pericardial effusion. Normal caliber of the thoracic aorta. Atherosclerosis of the aorta and coronary vasculature. Assessment of the vascular lumen cannot be performed without intravenous contrast.  Mediastinum/Nodes: No enlarged mediastinal or axillary lymph nodes. Thyroid gland, trachea, and esophagus demonstrate no significant findings. Lungs/Pleura: No acute airspace disease, effusion, or pneumothorax. Dependent hypoventilatory changes are noted. Central airways are patent. Upper Abdomen: Since the prior abdominal CT 09/07/2023, there has been development of borderline splenomegaly measuring 12.7 x 11.9 x 6.8 cm. Within the superolateral aspect of the spleen there is a new indeterminate 6.1 x 6.8 x 4.6 cm hypodensity. Musculoskeletal: No acute or destructive bony abnormalities. Reconstructed images demonstrate no additional findings. IMPRESSION: 1. Cardiomegaly.  No pericardial effusion. 2. No acute airspace disease. 3. Borderline splenomegaly and indeterminate 6.8 cm splenic hypodensity, which have developed in the interim since the 09/07/2023 CT. This is incompletely characterized on this exam, and if further evaluation is clinically indicated a contrasted CT or MRI of the abdomen could be considered. 4. Aortic Atherosclerosis (ICD10-I70.0). Coronary artery atherosclerosis. Electronically Signed   By: Ozell Daring M.D.   On: 01/22/2024 20:00   DG Chest 2 View Result Date: 01/22/2024 CLINICAL DATA:  Chest pain and shortness of breath. EXAM: CHEST - 2 VIEW COMPARISON:  07/19/2023. FINDINGS: The heart is enlarged and the mediastinal contour is within normal limits. Atherosclerotic calcification of the aorta is noted. Mild atelectasis or scarring is present at the left lung base. No consolidation, effusion, or pneumothorax is seen. No acute osseous abnormality. A vascular stent is noted in the axillary region on the left. IMPRESSION: No active cardiopulmonary disease. Electronically Signed   By: Leita Birmingham M.D.   On: 01/22/2024 18:17     Medications:     [START ON 01/24/2024] amiodarone   100 mg Oral Daily   aspirin  EC  81 mg Oral Daily   atorvastatin   80 mg Oral Daily   cholecalciferol   5,000  Units Oral Daily   diclofenac  Sodium  2 g Topical QID   docusate sodium   100 mg Oral QHS   donepezil   10 mg Oral QHS   finasteride   5 mg Oral Daily   insulin  aspart  0-15 Units Subcutaneous TID WC   insulin  aspart  0-5 Units Subcutaneous QHS   metoprolol  succinate  12.5 mg Oral Daily   [START ON 01/24/2024] oseltamivir   30 mg Oral Q M,W,F-1800   tamsulosin   0.4 mg Oral Daily   acetaminophen  **OR** acetaminophen , ALPRAZolam , magnesium  hydroxide, morphine  injection, nitroGLYCERIN , ondansetron  **OR** ondansetron  (ZOFRAN ) IV, traZODone   Assessment/ Plan:  Jeffery Joseph is a 84 y.o.  male  with ESRD on hemodialysis, hypertension, diabetes mellitus type II, BPH, chronic afib, peripheral arterial disease, and diastolic congestive heart failure who presented to hospital with atypical chest pain found to have hgb 4.5 and elevated troponin.   Outpatient dialysis [facility] [address]/TTS/AVF and is followed by Dr. Marcelino  End stage Renal Disease on hemodialysis  Last date of dialysis 1/10 but no fluid removed. Will have short supplemental dialysis treatment tomorrow given his recent transfusion to improve fluid status.  Anemia  Hgb 4.5, POA. Suspect GI bleed. Fecal occult positive. Patient received one unit of blood.  Now Hgb 7.6 GI consulted. Patient receives Mircera and Venofer outpatient. Will give 10,000 units Retacrit  with hemodialysis treatments.  Latest Reference Range & Units 01/23/24 03:23  Iron 45 - 182 ug/dL 29 (L)  UIBC ug/dL 850  TIBC 749 - 549 ug/dL 821 (L)  Saturation Ratios 17.9 - 39.5 % 16 (L)  Ferritin 24 - 336 ng/mL 882 (H)  (L): Data is abnormally low (H): Data is abnormally high    Hypertension with chronic kidney disease  On metoprolol . BP 103.51 today  Diabetes mellitus type II with chronic kidney disease  Most recent A1C 5.9 01/23/24. SSI managed by primary team  5. Chronic Afib  Patient on amiodarone .   LOS: 0 Dawne Casali SHAUNNA Dines 1/11/20264:10 PM  "

## 2024-01-23 NOTE — Progress Notes (Signed)
 " Progress Note   Patient: Jeffery Joseph FMW:[id number] DOB: 1940/06/29 DOA: 01/22/2024  DOS: the patient was seen and examined on 01/23/2024   Brief hospital course:  84 y.o. 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   Assessment and Plan:  Atypical chest pain - Likely multifactorial etiology, however need to rule out ACS.  Other differential include gastritis/ulcer.  Troponins elevated but flat suggestive of demand ischemia.  No changes noted on ECG.  Echo ordered and pending.  Cardiology consulted and following closely.  Heparin  drip ordered however has not been initiated yet secondary to below.  Acute normocytic anemia with concern for GI bleed - Patient with epigastric pain on presentation, slow downtrend in hemoglobin.  8.0 on presentation, 4.5 this morning although likely lab error.  Recheck noting 7.6.  Iron studies noting low TIBC and high ferritin suggesting anemia of chronic disease.  No reported or appreciated melena, dark stools, hematemesis.  3 units PRBCs ordered however will stop after 1.  Hemoglobin goal greater than 8 given NSTEMI.  Will consult GI.  Chronic atrial fibrillation - Resume amiodarone  100 mg twice daily.  Anticoagulation on hold secondary to above.  ESRD on HD - HD MWF.  Nephrology consulted and following closely.  BPH - Flomax , Proscar .  Diabetes mellitus - Insulin  sliding scale more.  Goals of care - Patient with dementia at baseline.  Family at bedside.  Reorient when possible.   Subjective: Patient resting comfortably this morning.  Denies any fever, shortness of breath, chest pain, nausea, vomiting, abdominal pain.  Information corroborated  by family at bedside given patient's dementia.  No noted dark stools, bloody vomiting, bleeding.  Physical Exam:  Vitals:   01/23/24 0811 01/23/24 0834 01/23/24 1035 01/23/24 1134  BP: (!) 99/46 (!) 102/45 (!) 97/45 (!) 100/48  Pulse: (!) 110 (!) 109 (!) 109 (!) 107  Resp: (!) 25 (!) 26  (!) 28  Temp: 98.4 F (36.9 C) 98.6 F (37 C)  98.3 F (36.8 C)  TempSrc: Oral Oral  Oral  SpO2: 97%     Weight:      Height:        GENERAL:  Alert, pleasant, no acute distress, tall HEENT:  EOMI, nasal cannula CARDIOVASCULAR:  RRR, no murmurs appreciated RESPIRATORY:  Clear to auscultation, no wheezing, rales, or rhonchi GASTROINTESTINAL:  Soft, nontender, nondistended EXTREMITIES:  No LE edema bilaterally NEURO:  No new focal deficits appreciated SKIN:  No rashes noted PSYCH:  Appropriate mood and affect     Data Reviewed:  Imaging Studies: CT Chest Wo Contrast Result Date: 01/22/2024 CLINICAL DATA:  Respiratory illness, nondiagnostic xray, chest pressure, short of breath EXAM: CT CHEST WITHOUT CONTRAST TECHNIQUE: Multidetector CT imaging of the chest was performed following the standard protocol without IV contrast. RADIATION DOSE REDUCTION: This exam was performed according to the departmental dose-optimization program which includes automated exposure control, adjustment of the mA and/or kV according to patient size and/or use of iterative reconstruction technique. COMPARISON:  01/22/2024, 09/07/2023 FINDINGS: Cardiovascular: Unenhanced imaging of the heart demonstrates cardiomegaly without pericardial effusion. Normal caliber of the thoracic aorta. Atherosclerosis of the aorta and coronary vasculature. Assessment of the vascular lumen cannot be performed without intravenous  contrast. Mediastinum/Nodes: No enlarged mediastinal or axillary lymph nodes. Thyroid gland, trachea, and esophagus demonstrate no significant findings. Lungs/Pleura: No acute airspace disease, effusion, or pneumothorax.  Dependent hypoventilatory changes are noted. Central airways are patent. Upper Abdomen: Since the prior abdominal CT 09/07/2023, there has been development of borderline splenomegaly measuring 12.7 x 11.9 x 6.8 cm. Within the superolateral aspect of the spleen there is a new indeterminate 6.1 x 6.8 x 4.6 cm hypodensity. Musculoskeletal: No acute or destructive bony abnormalities. Reconstructed images demonstrate no additional findings. IMPRESSION: 1. Cardiomegaly.  No pericardial effusion. 2. No acute airspace disease. 3. Borderline splenomegaly and indeterminate 6.8 cm splenic hypodensity, which have developed in the interim since the 09/07/2023 CT. This is incompletely characterized on this exam, and if further evaluation is clinically indicated a contrasted CT or MRI of the abdomen could be considered. 4. Aortic Atherosclerosis (ICD10-I70.0). Coronary artery atherosclerosis. Electronically Signed   By: Ozell Daring M.D.   On: 01/22/2024 20:00   DG Chest 2 View Result Date: 01/22/2024 CLINICAL DATA:  Chest pain and shortness of breath. EXAM: CHEST - 2 VIEW COMPARISON:  07/19/2023. FINDINGS: The heart is enlarged and the mediastinal contour is within normal limits. Atherosclerotic calcification of the aorta is noted. Mild atelectasis or scarring is present at the left lung base. No consolidation, effusion, or pneumothorax is seen. No acute osseous abnormality. A vascular stent is noted in the axillary region on the left. IMPRESSION: No active cardiopulmonary disease. Electronically Signed   By: Leita Birmingham M.D.   On: 01/22/2024 18:17   VAS US  DUPLEX DIALYSIS ACCESS (AVF, AVG) Result Date: 01/19/2024 DIALYSIS ACCESS Patient Name:  Jeffery Joseph  Date of Exam:   01/18/2024 Medical Rec #: [id number]      Accession #:    [id number] Date of Birth: 08/21/1940      Patient Gender: M Patient Age:   59 years Exam Location:  [facility] Procedure:      VAS US  DUPLEX DIALYSIS ACCESS (AVF, AVG) Referring  Phys: SELINDA GU --------------------------------------------------------------------------------  Reason for Exam: Routine follow up. Access Site: Left Upper Extremity. Access Type: Brachial-cephalic AVF. History: 2023: Left brachial-cephalic AVF created;          06/08/23: Stent placed at cephalic / subclavian confluence;          09/2023: mid upper arm PTA and stent;. Performing Technologist: Elsie Churn RT, RDMS, RVT  Examination Guidelines: A complete evaluation includes B-mode imaging, spectral Doppler, color Doppler, and power Doppler as needed of all accessible portions of each vessel. Unilateral testing is considered an integral part of a complete examination. Limited examinations for reoccurring indications may be performed as noted.  Findings: +--------------------+----------+-----------------+--------+ AVF                 PSV (cm/s)Flow Vol (mL/min)Comments +--------------------+----------+-----------------+--------+ Native artery inflow   196          1496                +--------------------+----------+-----------------+--------+ AVF Anastomosis        418                              +--------------------+----------+-----------------+--------+  +---------------+----------+-------------+----------+-----------------------+ OUTFLOW VEIN   PSV (cm/s)Diameter (cm)Depth (cm)       Describe         +---------------+----------+-------------+----------+-----------------------+ Subclavian vein   194                                                   +---------------+----------+-------------+----------+-----------------------+  Confluence        129                                    stent          +---------------+----------+-------------+----------+-----------------------+ Clavicle          139                                    stent          +---------------+----------+-------------+----------+-----------------------+ Shoulder          152                                     stent          +---------------+----------+-------------+----------+-----------------------+ Prox UA           133                                    stent          +---------------+----------+-------------+----------+-----------------------+ Mid UA            150                                    stent          +---------------+----------+-------------+----------+-----------------------+ Dist UA           325                           possible retained valve +---------------+----------+-------------+----------+-----------------------+ AC Fossa          119                                                   +---------------+----------+-------------+----------+-----------------------+  Summary: Patent left brachial-cephalic AVF with no focal hemodynamically significant velocity increases or internal vessel narrowing. Normal Flow Volume noted.  *See table(s) above for measurements and observations.  Diagnosing physician: Selinda Gu MD Electronically signed by Selinda Gu MD on 01/19/2024 at 7:24:41 AM.  --------------------------------------------------------------------------------   Final     There are no new results to review at this time.  Previous records (including but not limited to H&P, progress notes, nursing notes, TOC management) were reviewed in assessment of this patient.  Labs: CBC: Recent Labs  Lab 01/22/24 1745 01/23/24 0430 01/23/24 0826  WBC 11.8* 5.8  --   HGB 8.0* 4.5* 7.6*  HCT 25.1* 15.3*  --   MCV 76.5* 82.3  --   PLT 209 117*  --    Basic Metabolic Panel: Recent Labs  Lab 01/22/24 1745 01/23/24 0323  NA 134* 133*  K 3.5 3.4*  CL 94* 97*  CO2 27 23  GLUCOSE 134* 105*  BUN 46* 47*  CREATININE 4.70* 4.68*  CALCIUM  8.4* 8.0*   Liver Function Tests: Recent Labs  Lab 01/22/24 1745  AST 73*  ALT 70*  ALKPHOS 86  BILITOT 0.5  PROT 7.6  ALBUMIN  3.1*   CBG: Recent Labs  Lab 01/23/24 0911  GLUCAP 98    Scheduled Meds:  [START  ON 01/24/2024] amiodarone   100 mg Oral Daily   aspirin  EC  81 mg Oral Daily   atorvastatin   80 mg Oral Daily   cholecalciferol   5,000 Units Oral Daily   diclofenac  Sodium  2 g Topical QID   docusate sodium   100 mg Oral QHS   donepezil   10 mg Oral QHS   finasteride   5 mg Oral Daily   insulin  aspart  0-15 Units Subcutaneous TID WC   insulin  aspart  0-5 Units Subcutaneous QHS   metoprolol  succinate  12.5 mg Oral Daily   [START ON 01/24/2024] oseltamivir   30 mg Oral Q M,W,F-1800   tamsulosin   0.4 mg Oral Daily   Continuous Infusions:  heparin      PRN Meds:.acetaminophen  **OR** acetaminophen , ALPRAZolam , magnesium  hydroxide, morphine  injection, nitroGLYCERIN , ondansetron  **OR** ondansetron  (ZOFRAN ) IV, traZODone   Family Communication: At bedside  Disposition: Status is: Observation The patient remains OBS appropriate and will d/c before 2 midnights.     Time spent: 50 minutes  Length of inpatient stay: 0 days  Author: Carliss LELON Canales, DO 01/23/2024 12:41 PM  For on call review [email or web address].   "

## 2024-01-23 NOTE — ED Notes (Signed)
 Blood consent obtained, POA Dava Agostino signed on behalf of pt, this RN witnessed signature.

## 2024-01-23 NOTE — H&P (Addendum)
 "     [place]   PATIENT NAME: Jeffery Joseph    MR#:  [id number]  DATE OF BIRTH:  07/15/1940  DATE OF ADMISSION:  01/22/2024  PRIMARY CARE PHYSICIAN: Epifanio Alm SQUIBB, MD   Patient is coming from: Home  REQUESTING/REFERRING PHYSICIAN: Viviann Mungo, MD  CHIEF COMPLAINT:   Chief Complaint  Patient presents with   Shortness of Breath    HISTORY OF PRESENT ILLNESS:  Jeffery Joseph is a 84 y.o. 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  He had a fever and chills.  He started having symptoms on Friday and has been on Tamiflu  for possible influenza.  He admitted to associated dry cough without wheezing.  No abdominal pain or diarrhea or melena or bright red bleeding per rectum.  No bleeding diathesis.  His last hemodialysis was on Friday.  ED Course: When he came to the ER, BP was 106/49 with heart rate of 111 with otherwise normal vital signs.  Labs revealed mild hyponatremia 134 and hypochloremia of 94, blood glucose of 134 and BUN 46 with creatinine 4.7 and calcium  8.4, albumin  3.1 and AST 73 with ALT 70.  proBNP was more than 3500 and high sensitive troponin I was 634 and later 632 EKG as reviewed by me :  EKG showed atrial fibrillation sliding with RVR with occasional PVCs, right bundle branch block and left intrafascicular block (bifascicular block) and minimal voltage criteria for LVH. Imaging: 2 view chest x-ray showed no acute cardiopulmonary disease. Chest CT without contrast revealed the following: 1. Cardiomegaly.  No pericardial effusion. 2. No acute airspace disease. 3. Borderline splenomegaly and indeterminate 6.8 cm splenic hypodensity, which have developed  in the interim since the 09/07/2023 CT. This is incompletely characterized on this exam, and if further evaluation is clinically indicated a contrasted CT or MRI of the abdomen could be considered. 4. Aortic Atherosclerosis (ICD10-I70.0). Coronary artery atherosclerosis.  The patient was given 500 mL IV normal saline bolus and 40 mg of p.o. Pepcid  as well as Tums.  He will be admitted to a progressive unit observation bed for further evaluation and management. PAST MEDICAL HISTORY:   Past Medical History:  Diagnosis Date   Anxiety    Arthritis    Depression    Diabetes mellitus without complication (HCC)    End stage renal disease (HCC)    Hypertension    Pneumonia    Renal disorder     PAST SURGICAL HISTORY:   Past Surgical History:  Procedure Laterality Date   A/V FISTULAGRAM Left 06/08/2023   Procedure: A/V Fistulagram;  Surgeon: Jama Cordella MATSU, MD;  Location: [facility];  Service: Cardiovascular;  Laterality: Left;   A/V SHUNT INTERVENTION Left 09/29/2023   Procedure: A/V SHUNT INTERVENTION;  Surgeon: Marea Selinda RAMAN, MD;  Location: [facility];  Service: Cardiovascular;  Laterality: Left;   APPLICATION OF WOUND VAC Right 11/14/2021   Procedure: APPLICATION OF WOUND VAC;  Surgeon: Jama Cordella MATSU, MD;  Location: [facility] OR[facility];  Service: Vascular;  Laterality: Right;   AV FISTULA PLACEMENT Left 11/14/2021   Procedure: ARTERIOVENOUS (AV) FISTULA CREATION ( BRACHIAL CEPHALIC);  Surgeon: Jama Cordella MATSU, MD;  Location: [facility] OR[facility];  Service: Vascular;  Laterality: Left;    SOCIAL HISTORY:   Social History   Tobacco Use   Smoking status: Never   Smokeless tobacco: Never  Substance Use Topics   Alcohol use: No    FAMILY HISTORY:  History reviewed. No pertinent family history.  DRUG ALLERGIES:  Allergies[1]  REVIEW OF SYSTEMS:   ROS As per history of present illness. All pertinent systems were reviewed above. Constitutional, HEENT, cardiovascular,  respiratory, GI, GU, musculoskeletal, neuro, psychiatric, endocrine, integumentary and hematologic systems were reviewed and are otherwise negative/unremarkable except for positive findings mentioned above in the HPI.   MEDICATIONS AT HOME:   Prior to Admission medications  Medication Sig Start Date End Date Taking? Authorizing Provider  acetaminophen  (TYLENOL ) 325 MG tablet Take 650 mg by mouth every 6 (six) hours as needed.   Yes [provider]  ALPRAZolam  (XANAX ) 0.5 MG tablet Take 0.5 mg by mouth 3 (three) times daily as needed for sleep. 02/05/21  Yes [provider]  amiodarone  (PACERONE ) 200 MG tablet Take 2 tablets (400 mg total) by mouth 2 (two) times daily for 7 days, THEN 1 tablet (200 mg total) daily. 07/24/23 01/22/24 Yes Sreenath, Sudheer B, MD  apixaban  (ELIQUIS ) 2.5 MG TABS tablet Take 1 tablet (2.5 mg total) by mouth 2 (two) times daily. 07/24/23 01/22/24 Yes Sreenath, Sudheer B, MD  Cholecalciferol  125 MCG (5000 UT) TABS Take 5,000 Units by mouth daily.   Yes [provider]  clopidogrel  (PLAVIX ) 75 MG tablet Take 1 tablet (75 mg total) by mouth daily. 06/09/23  Yes Schnier, Cordella MATSU, MD  diclofenac  Sodium (VOLTAREN ) 1 % GEL Apply 2 g topically 4 (four) times daily. 07/24/23  Yes Sreenath, Sudheer B, MD  Docusate Sodium  (DSS) 100 MG CAPS Take 100 mg by mouth at bedtime.   Yes [provider]  donepezil  (ARICEPT ) 10 MG tablet Take 10 mg by mouth at bedtime. 05/14/21  Yes [provider]  finasteride  (PROSCAR ) 5 MG tablet Take 1 tablet (5 mg total) by mouth daily. 08/12/19  Yes Stoioff, Glendia BROCKS, MD  lidocaine -prilocaine  (EMLA ) cream Apply 1 Application topically as needed (Dialysis). 08/18/23  Yes [provider]  metoprolol  succinate (TOPROL -XL) 25 MG 24 hr tablet Take 0.5 tablets (12.5 mg total) by mouth daily. 07/24/23 01/22/24 Yes Sreenath, Sudheer B, MD  tamsulosin  (FLOMAX ) 0.4 MG CAPS capsule Take 1 capsule (0.4 mg total) by mouth  daily. 08/12/19  Yes Stoioff, Glendia BROCKS, MD  enalapril  (VASOTEC ) 20 MG tablet Take 20 mg by mouth 2 (two) times daily. Patient not taking: Reported on 01/20/2024 08/12/21   [provider]  furosemide  (LASIX ) 40 MG tablet Take 1 tablet (40 mg total) by mouth every other day. Patient not taking: Reported on 01/20/2024 07/25/23   Jhonny Calvin NOVAK, MD  glipiZIDE  (GLUCOTROL  XL) 5 MG 24 hr tablet Take by mouth. Patient not taking: Reported on 01/20/2024 12/01/11   [provider]  LOKELMA 10 g PACK packet Take 1 packet by mouth daily. Patient not taking: Reported on 01/20/2024 07/12/23   [provider]  sodium bicarbonate  650 MG tablet Take 650 mg by mouth 2 (two) times daily. Patient not taking: Reported on 01/20/2024 06/16/23   [provider]      VITAL SIGNS:  Blood pressure (!) 101/49, pulse (!) 108, temperature 97.8 F (36.6 C), temperature source Oral, resp. rate (!) 23, height 6' 5 (1.956 m), weight 63.5 kg, SpO2 100%.  PHYSICAL EXAMINATION:  Physical Exam  GENERAL:  84 y.o.-year-old patient lying in the bed with no acute distress.  EYES: Pupils equal, round, reactive to light and accommodation. No scleral icterus. Extraocular muscles intact.  HEENT: Head atraumatic, normocephalic. Oropharynx and nasopharynx clear.  NECK:  Supple, no jugular venous distention. No thyroid enlargement, no tenderness.  LUNGS: Normal breath sounds bilaterally, no wheezing, rales,rhonchi or crepitation. No use of accessory muscles of respiration.  CARDIOVASCULAR: Regular rate and rhythm, S1, S2 normal. No murmurs, rubs, or gallops.  ABDOMEN: Soft, nondistended, nontender. Bowel sounds present. No organomegaly or mass.  EXTREMITIES: No pedal edema, cyanosis, or clubbing.  NEUROLOGIC: Cranial nerves II through XII are intact. Muscle strength 5/5 in all extremities. Sensation intact. Gait not checked.  PSYCHIATRIC: The patient is alert and oriented x 3.  Normal affect and good eye  contact. SKIN: No obvious rash, lesion, or ulcer.   LABORATORY PANEL:   CBC Recent Labs  Lab 01/22/24 1745  WBC 11.8*  HGB 8.0*  HCT 25.1*  PLT 209   ------------------------------------------------------------------------------------------------------------------  Chemistries  Recent Labs  Lab 01/22/24 1745  NA 134*  K 3.5  CL 94*  CO2 27  GLUCOSE 134*  BUN 46*  CREATININE 4.70*  CALCIUM  8.4*  AST 73*  ALT 70*  ALKPHOS 86  BILITOT 0.5   ------------------------------------------------------------------------------------------------------------------  Cardiac Enzymes No results for input(s): TROPONINI in the last 168 hours. ------------------------------------------------------------------------------------------------------------------  RADIOLOGY:  CT Chest Wo Contrast Result Date: 01/22/2024 CLINICAL DATA:  Respiratory illness, nondiagnostic xray, chest pressure, short of breath EXAM: CT CHEST WITHOUT CONTRAST TECHNIQUE: Multidetector CT imaging of the chest was performed following the standard protocol without IV contrast. RADIATION DOSE REDUCTION: This exam was performed according to the departmental dose-optimization program which includes automated exposure control, adjustment of the mA and/or kV according to patient size and/or use of iterative reconstruction technique. COMPARISON:  01/22/2024, 09/07/2023 FINDINGS: Cardiovascular: Unenhanced imaging of the heart demonstrates cardiomegaly without pericardial effusion. Normal caliber of the thoracic aorta. Atherosclerosis of the aorta and coronary vasculature. Assessment of the vascular lumen cannot be performed without intravenous contrast. Mediastinum/Nodes: No enlarged mediastinal or axillary lymph nodes. Thyroid gland, trachea, and esophagus demonstrate no significant findings. Lungs/Pleura: No acute airspace disease, effusion, or pneumothorax. Dependent hypoventilatory changes are noted. Central airways are  patent. Upper Abdomen: Since the prior abdominal CT 09/07/2023, there has been development of borderline splenomegaly measuring 12.7 x 11.9 x 6.8 cm. Within the superolateral aspect of the spleen there is a new indeterminate 6.1 x 6.8 x 4.6 cm hypodensity. Musculoskeletal: No acute or destructive bony abnormalities. Reconstructed images demonstrate no additional findings. IMPRESSION: 1. Cardiomegaly.  No pericardial effusion. 2. No acute airspace disease. 3. Borderline splenomegaly and indeterminate 6.8 cm splenic hypodensity, which have developed in the interim since the 09/07/2023 CT. This is incompletely characterized on this exam, and if further evaluation is clinically indicated a contrasted CT or MRI of the abdomen could be considered. 4. Aortic Atherosclerosis (ICD10-I70.0). Coronary artery atherosclerosis. Electronically Signed   By: Ozell Daring M.D.   On: 01/22/2024 20:00   DG Chest 2 View Result Date: 01/22/2024 CLINICAL DATA:  Chest pain and shortness of breath. EXAM: CHEST - 2 VIEW COMPARISON:  07/19/2023. FINDINGS: The heart is enlarged and the mediastinal contour is within normal limits. Atherosclerotic calcification of the aorta is noted. Mild atelectasis or scarring is present at the left lung base. No consolidation, effusion, or pneumothorax is seen. No acute osseous abnormality. A vascular stent is noted in the axillary region on the left. IMPRESSION:  No active cardiopulmonary disease. Electronically Signed   By: Leita Birmingham M.D.   On: 01/22/2024 18:17      IMPRESSION AND PLAN:  Assessment and Plan: * Chest pain - The patient has elevated troponin I with concern for non-STEMI though he could be having acute CHF with his ESRD with demand ischemia, especially given elevated BNP. - He will be admitted to a progressive unit bed. - Will place him on IV heparin  for potential ACS to replace Eliquis . - Will follow serial troponins. - He is currently chest pain-free.  He was placed on as  needed sublingual nitroglycerin  and morphine  sulfate for chest pain. - Will place him on aspirin  and high-dose statin therapy. - 2D echo and cardiology consult to be obtained. - I notified Dr. Florencio about patient.  Chronic atrial fibrillation with RVR (HCC) - Will continue amiodarone  and the patient will be on IV heparin . - Will utilize Lopressor  for rate control given the possibility of MI.  End-stage renal disease on hemodialysis [facility]) - Nephrology consult will be obtained. - I notified Dr. Douglas about the patient. - Continue Lokelma and cholecalciferol .  Influenza A - Will continue his Tamiflu   BPH (benign prostatic hyperplasia) - Will continue Flomax  and Proscar .  Type 2 diabetes mellitus with chronic kidney disease, without long-term current use of insulin  (HCC) - The patient will be placed on supplemental coverage with NovoLog . - Will continue Glucotrol  XL.   DVT prophylaxis: IV heparin . Advanced Care Planning:  Code Status: The patient is DNR and DNI. Family Communication:  The plan of care was discussed in details with the patient (and family). I answered all questions. The patient agreed to proceed with the above mentioned plan. Further management will depend upon hospital course. Disposition Plan: Back to previous home environment Consults called: Cardiology and nephrology. All the records are reviewed and case discussed with ED provider.  Status is: Observation  I certify that at the time of admission, it is my clinical judgment that the patient will require hospital care extending less than 2 midnights.                            Dispo: The patient is from: Home              Anticipated d/c is to: Home              Patient currently is not medically stable to d/c.              Difficult to place patient: No  Madison DELENA Peaches M.D on 01/23/2024 at 3:27 AM  [facility]   From 7 PM-7 AM, contact night-coverage [email or web address]  CC: Primary care physician;  Epifanio Alm SQUIBB, MD     [1] No Known Allergies  "

## 2024-01-23 NOTE — ED Notes (Signed)
 Spoke to Wade, MD via secure chat and MD decided to hold heparin  at this time given pt's possible GI bleed. This RN held med and explained decision to pt's daughter/ POA, she verbalized understanding.

## 2024-01-23 NOTE — ED Notes (Signed)
 Fecal occult obtained per [facility] MD. Test positive. MD notified.

## 2024-01-23 NOTE — Hospital Course (Signed)
 84 y.o. 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    Assessment and Plan:   Atypical chest pain - Likely multifactorial etiology, however need to rule out ACS.  Other differential include gastritis/ulcer.  Troponins elevated but flat suggestive of demand ischemia.  No changes noted on ECG.  Echo ordered and pending.  Cardiology consulted and following closely.  Heparin  drip ordered however has not been initiated yet secondary to below.   Acute normocytic anemia with concern for GI bleed - Patient with epigastric pain on presentation, slow downtrend in hemoglobin.  8.0 on presentation, 4.5 this morning although likely lab error.  Recheck noting 7.6.  Iron studies noting low TIBC and high ferritin suggesting anemia of chronic disease.  No reported or appreciated melena, dark stools, hematemesis.  3 units PRBCs ordered however will stop after 1.  Hemoglobin goal greater than 8 given NSTEMI.  Will consult GI.   Chronic atrial fibrillation - Resume amiodarone  100 mg twice daily.  Anticoagulation on hold secondary to above.   ESRD on HD - HD MWF.  Nephrology consulted and following closely.   BPH - Flomax , Proscar .   Diabetes mellitus - Insulin  sliding scale more.   Goals of care - Patient with dementia at baseline.  Family at bedside.  Reorient when possible.

## 2024-01-24 LAB — TROPONIN T, HIGH SENSITIVITY
Troponin T High Sensitivity: 839 ng/L (ref 0–19)
Troponin T High Sensitivity: 789 ng/L (ref 0–19)

## 2024-01-24 LAB — RENAL FUNCTION PANEL
Albumin: 2.7 g/dL — ABNORMAL LOW (ref 3.5–5.0)
Anion gap: 11 (ref 5–15)
BUN: 31 mg/dL — ABNORMAL HIGH (ref 8–23)
CO2: 27 mmol/L (ref 22–32)
Calcium: 8.3 mg/dL — ABNORMAL LOW (ref 8.9–10.3)
Chloride: 94 mmol/L — ABNORMAL LOW (ref 98–111)
Creatinine, Ser: 3.37 mg/dL — ABNORMAL HIGH (ref 0.61–1.24)
GFR, Estimated: 17 mL/min — ABNORMAL LOW
Glucose, Bld: 173 mg/dL — ABNORMAL HIGH (ref 70–99)
Phosphorus: 2.5 mg/dL (ref 2.5–4.6)
Potassium: 3.6 mmol/L (ref 3.5–5.1)
Sodium: 132 mmol/L — ABNORMAL LOW (ref 135–145)

## 2024-01-24 LAB — BLOOD CULTURE ID PANEL (REFLEXED) - BCID2

## 2024-01-24 LAB — COMPREHENSIVE METABOLIC PANEL WITH GFR
ALT: 48 U/L — ABNORMAL HIGH (ref 0–44)
AST: 34 U/L (ref 15–41)
Albumin: 2.7 g/dL — ABNORMAL LOW (ref 3.5–5.0)
Alkaline Phosphatase: 66 U/L (ref 38–126)
Anion gap: 13 (ref 5–15)
BUN: 52 mg/dL — ABNORMAL HIGH (ref 8–23)
CO2: 25 mmol/L (ref 22–32)
Calcium: 8.5 mg/dL — ABNORMAL LOW (ref 8.9–10.3)
Chloride: 97 mmol/L — ABNORMAL LOW (ref 98–111)
Creatinine, Ser: 5.05 mg/dL — ABNORMAL HIGH (ref 0.61–1.24)
GFR, Estimated: 11 mL/min — ABNORMAL LOW
Glucose, Bld: 120 mg/dL — ABNORMAL HIGH (ref 70–99)
Potassium: 3.7 mmol/L (ref 3.5–5.1)
Sodium: 135 mmol/L (ref 135–145)
Total Bilirubin: 0.9 mg/dL (ref 0.0–1.2)
Total Protein: 6.7 g/dL (ref 6.5–8.1)

## 2024-01-24 LAB — RESPIRATORY PANEL BY PCR

## 2024-01-24 LAB — RESP PANEL BY RT-PCR (RSV, FLU A&B, COVID)  RVPGX2
Influenza A by PCR: NEGATIVE
Influenza B by PCR: NEGATIVE
Resp Syncytial Virus by PCR: NEGATIVE
SARS Coronavirus 2 by RT PCR: NEGATIVE

## 2024-01-24 LAB — GLUCOSE, CAPILLARY
Glucose-Capillary: 119 mg/dL — ABNORMAL HIGH (ref 70–99)
Glucose-Capillary: 119 mg/dL — ABNORMAL HIGH (ref 70–99)
Glucose-Capillary: 92 mg/dL (ref 70–99)
Glucose-Capillary: 161 mg/dL — ABNORMAL HIGH (ref 70–99)
Glucose-Capillary: 126 mg/dL — ABNORMAL HIGH (ref 70–99)

## 2024-01-24 LAB — HEPATITIS B SURFACE ANTIGEN: Hepatitis B Surface Ag: NONREACTIVE

## 2024-01-24 LAB — HEMOGLOBIN AND HEMATOCRIT, BLOOD
HCT: 27.7 % — ABNORMAL LOW (ref 39.0–52.0)
Hemoglobin: 9.1 g/dL — ABNORMAL LOW (ref 13.0–17.0)

## 2024-01-24 LAB — CBC
HCT: 25 % — ABNORMAL LOW (ref 39.0–52.0)
Hemoglobin: 8.2 g/dL — ABNORMAL LOW (ref 13.0–17.0)
MCH: 25.2 pg — ABNORMAL LOW (ref 26.0–34.0)
MCHC: 32.8 g/dL (ref 30.0–36.0)
MCV: 76.7 fL — ABNORMAL LOW (ref 80.0–100.0)
Platelets: 202 K/uL (ref 150–400)
RBC: 3.26 MIL/uL — ABNORMAL LOW (ref 4.22–5.81)
RDW: 17.7 % — ABNORMAL HIGH (ref 11.5–15.5)
WBC: 8.9 K/uL (ref 4.0–10.5)
nRBC: 0 % (ref 0.0–0.2)

## 2024-01-24 LAB — PROCALCITONIN: Procalcitonin: 100 ng/mL

## 2024-01-24 LAB — APTT
aPTT: 52 s — ABNORMAL HIGH (ref 24–36)
aPTT: 47 s — ABNORMAL HIGH (ref 24–36)

## 2024-01-24 LAB — HEMOGLOBIN A1C
Hgb A1c MFr Bld: 5.7 % — ABNORMAL HIGH (ref 4.8–5.6)
Mean Plasma Glucose: 116.89 mg/dL

## 2024-01-24 LAB — LACTIC ACID, PLASMA: Lactic Acid, Venous: 1.4 mmol/L (ref 0.5–1.9)

## 2024-01-24 LAB — PHOSPHORUS: Phosphorus: 3.9 mg/dL (ref 2.5–4.6)

## 2024-01-24 LAB — MAGNESIUM: Magnesium: 2 mg/dL (ref 1.7–2.4)

## 2024-01-24 LAB — MRSA NEXT GEN BY PCR, NASAL: MRSA by PCR Next Gen: NOT DETECTED

## 2024-01-24 MED ORDER — EPOETIN ALFA-EPBX 10000 UNIT/ML IJ SOLN
10000.0000 [IU] | Freq: Once | INTRAMUSCULAR | Status: AC
  Administered 2024-01-24: 10000 [IU] via INTRAVENOUS
  Filled 2024-01-24: qty 1

## 2024-01-24 MED ORDER — SODIUM CHLORIDE 0.9 % IV SOLN
1.0000 g | Freq: Every day | INTRAVENOUS | Status: DC
  Filled 2024-01-24: qty 10

## 2024-01-24 MED ORDER — SODIUM CHLORIDE 0.9 % IV SOLN
2.0000 g | INTRAVENOUS | Status: DC
  Administered 2024-01-24: 2 g via INTRAVENOUS
  Filled 2024-01-24: qty 20

## 2024-01-24 MED ORDER — FAMOTIDINE 20 MG PO TABS
20.0000 mg | ORAL_TABLET | Freq: Every day | ORAL | Status: DC
  Administered 2024-01-24: 20 mg via ORAL
  Filled 2024-01-24: qty 1

## 2024-01-24 MED ORDER — ORAL CARE MOUTH RINSE: 15.0000 mL | OROMUCOSAL | Status: DC | PRN

## 2024-01-24 MED ORDER — HEPARIN BOLUS VIA INFUSION
1900.0000 [IU] | Freq: Once | INTRAVENOUS | Status: AC
  Administered 2024-01-24: 1900 [IU] via INTRAVENOUS
  Filled 2024-01-24: qty 1900

## 2024-01-24 MED ORDER — METRONIDAZOLE 500 MG/100ML IV SOLN
500.0000 mg | Freq: Two times a day (BID) | INTRAVENOUS | Status: DC
  Administered 2024-01-24: 500 mg via INTRAVENOUS
  Filled 2024-01-24 (×2): qty 100

## 2024-01-24 MED ORDER — VANCOMYCIN HCL 1500 MG/300ML IV SOLN
1500.0000 mg | Freq: Once | INTRAVENOUS | Status: AC
  Administered 2024-01-24: 1500 mg via INTRAVENOUS
  Filled 2024-01-24: qty 300

## 2024-01-24 MED ORDER — VANCOMYCIN HCL 750 MG/150ML IV SOLN
750.0000 mg | INTRAVENOUS | Status: DC
  Filled 2024-01-24: qty 150

## 2024-01-24 MED ORDER — CHLORHEXIDINE GLUCONATE CLOTH 2 % EX PADS
6.0000 | MEDICATED_PAD | Freq: Every day | CUTANEOUS | Status: DC
  Administered 2024-01-24 – 2024-02-03 (×11): 6 via TOPICAL

## 2024-01-24 MED ORDER — HEPARIN BOLUS VIA INFUSION
2000.0000 [IU] | Freq: Once | INTRAVENOUS | Status: AC
  Administered 2024-01-24: 2000 [IU] via INTRAVENOUS
  Filled 2024-01-24: qty 2000

## 2024-01-24 MED ORDER — SODIUM CHLORIDE 0.9 % IV SOLN
1.0000 g | Freq: Once | INTRAVENOUS | Status: AC
  Administered 2024-01-24: 1 g via INTRAVENOUS
  Filled 2024-01-24: qty 10

## 2024-01-24 MED ORDER — SODIUM CHLORIDE 0.9 % IV SOLN
100.0000 mg | Freq: Two times a day (BID) | INTRAVENOUS | Status: DC
  Administered 2024-01-24 – 2024-01-25 (×2): 100 mg via INTRAVENOUS
  Filled 2024-01-24 (×3): qty 100

## 2024-01-24 MED ORDER — PANTOPRAZOLE SODIUM 40 MG IV SOLR
40.0000 mg | Freq: Every day | INTRAVENOUS | Status: DC
  Administered 2024-01-24 – 2024-01-30 (×7): 40 mg via INTRAVENOUS
  Filled 2024-01-24 (×7): qty 10

## 2024-01-24 NOTE — Progress Notes (Signed)
 Pharmacy Antibiotic Note  Jeffery Joseph is a 84 y.o. male w/ ESRD on MWF HD, admitted on 01/22/2024 with sepsis.  Pharmacy has been consulted for Cefepime  and Vancomycin  dosing.  Plan: Cefepime   1 gm q24hr per indication and ESRD on HD.  Pt given Vancomycin  1500 mg once. Vancomycin  750 mg IV Q MWF after dialysis.  Follow up culture results to assess for antibiotic optimization. Monitor renal function to assess for any necessary antibiotic dosing changes. Pharmacy will continue to follow and will adjust abx dosing whenever warranted.  Temp (24hrs), Avg:98.5 F (36.9 C), Min:97.8 F (36.6 C), Max:99.8 F (37.7 C)   Recent Labs  Lab 01/22/24 1745 01/23/24 0323 01/23/24 0430 01/23/24 2200  WBC 11.8*  --  5.8  --   CREATININE 4.70* 4.68*  --   --   LATICACIDVEN 2.2* 1.5  --  1.4    Estimated Creatinine Clearance: 10.6 mL/min (A) (by C-G formula based on SCr of 4.68 mg/dL (H)).    Allergies[1]  Antimicrobials this admission: 1/12 Cefepime  >> 1/12 Vancomycin  >>  1/12 Flagyl  >>   Microbiology results: 1/12 BCx: Pending  Thank you for allowing pharmacy to be a part of this patients care.  Rankin CANDIE Dills, PharmD, [facility] 01/24/2024 1:26 AM     [1] No Known Allergies

## 2024-01-24 NOTE — Progress Notes (Signed)
 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  Vitals, labs, and imaging reviewed.  Low-dose norepinephrine  in place.  Grossly positive procal, cultures collected.  eICU Interventions  Considered broad-spectrum antibiotics, Tamiflu .  Follow cultures and procalcitonin.  Nasal MRSA.  Hold antihypertensives-metoprolol .  Maintain midodrine  and amiodarone .  Add norepinephrine  as needed to maintain MAP greater than 65  DVT prophylaxis with heparin  infusion GI prophylaxis not indicated     Intervention Category Evaluation Type: New Patient Evaluation  Jeffery Joseph 01/24/2024, 1:25 AM

## 2024-01-24 NOTE — Progress Notes (Signed)
 [facility] CLINIC CARDIOLOGY PROGRESS NOTE   Patient ID: Jeffery Joseph MRN: [id number] DOB/AGE: Jul 24, 1940 83 y.o.  Admit date: 01/22/2024 Referring Physician Dr. Madison Peaches Primary Physician Epifanio Alm SQUIBB, MD  Primary Cardiologist Dr. Florencio Reason for Consultation AoCHF  HPI: Jeffery Joseph is a 84 y.o. 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 Cardiology was consulted for further evaluation.   Interval History:  - Patient seen and examined this morning, resting comfortably in hospital bed. - Was started on Levophed  overnight due to hypotension. - Feels his breathing is overall stable.  Denies any chest discomfort. - Undergoing dialysis this morning.  Review of systems complete and found to be negative unless listed above   Vitals:   01/24/24 1045 01/24/24 1100 01/24/24 1115 01/24/24 1116  BP: (!) 127/52 (!) 118/46 (!) 108/47   Pulse: (!) 116 (!) 117 (!) 114   Resp: (!) 21 17 15  (!) 21  Temp:   98.1 F (36.7 C)   TempSrc:   Oral   SpO2: 100% 99% 99%   Weight:      Height:         Intake/Output Summary (Last 24 hours) at 01/24/2024 1141 Last data filed at 01/24/2024 1115 Gross per 24 hour  Intake 964.99 ml  Output 1650 ml  Net -685.01 ml     PHYSICAL EXAM General: Chronically ill-appearing male, well nourished, in no acute distress. HEENT: Normocephalic and atraumatic. Neck: No JVD.  Lungs: Normal respiratory effort on room air. Clear bilaterally to auscultation. No wheezes, crackles, rhonchi.  Heart: HRR, elevated rate. Normal S1 and S2 without gallops or murmurs. Radial & DP pulses 2+ bilaterally. Abdomen: Non-distended appearing.  Msk: Normal strength and tone for age. Extremities: No clubbing, cyanosis or edema.   Neuro: Alert and oriented X 3. Psych: Mood  appropriate, affect congruent.    LABS: Basic Metabolic Panel: Recent Labs    01/23/24 0323 01/24/24 0317  NA 133* 135  K 3.4* 3.7  CL 97* 97*  CO2 23 25  GLUCOSE 105* 120*  BUN 47* 52*  CREATININE 4.68* 5.05*  CALCIUM  8.0* 8.5*  MG  --  2.0  PHOS  --  3.9   Liver Function Tests: Recent Labs    01/22/24 1745 01/24/24 0317  AST 73* 34  ALT 70* 48*  ALKPHOS 86 66  BILITOT 0.5 0.9  PROT 7.6 6.7  ALBUMIN  3.1* 2.7*   No results for input(s): LIPASE, AMYLASE in the last 72 hours. CBC: Recent Labs    01/23/24 0430 01/23/24 0826 01/24/24 0317 01/24/24 1005  WBC 5.8  --  8.9  --   HGB 4.5*   < > 8.2* 9.1*  HCT 15.3*   < > 25.0* 27.7*  MCV 82.3  --  76.7*  --   PLT 117*  --  202  --    < > = values in this interval not displayed.   Cardiac Enzymes: No results for input(s): CKTOTAL, CKMB, CKMBINDEX, TROPONINIHS in the last 72 hours. BNP: No results for input(s): BNP in the last 72 hours. D-Dimer: No results for input(s): DDIMER in the last 72 hours. Hemoglobin A1C: Recent Labs    01/23/24 0430  HGBA1C 5.9*   Fasting Lipid Panel: No results for input(s): CHOL, HDL, LDLCALC, TRIG, CHOLHDL, LDLDIRECT in the last 72 hours. Thyroid  Function Tests: No results for input(s): TSH, T4TOTAL, T3FREE, THYROIDAB in the last 72 hours.  Invalid input(s): FREET3 Anemia Panel: Recent Labs    01/23/24 0323 01/23/24 0430 01/23/24 1008  VITAMINB12  --   --  1,698*  FOLATE  --   --  7.9  FERRITIN 882*  --   --   TIBC 178*  --   --   IRON 29*  --   --   RETICCTPCT  --  2.1  --     DG Chest 1 View Result Date: 01/23/2024 EXAM: 1 VIEW(S) XRAY OF THE CHEST 01/23/2024 10:37:00 PM COMPARISON: 01/22/2024 CLINICAL HISTORY: Lethargy FINDINGS: LINES, TUBES AND DEVICES: Left axillary stent noted. LUNGS AND PLEURA: Increased interstitial markings, lower lung/infrahilar predominant, favoring mild interstitial edema although multifocal  infection/pneumonia is also possible. No pleural effusion. No pneumothorax. HEART AND MEDIASTINUM: Stable cardiomegaly. Aortic atherosclerosis. BONES AND SOFT TISSUES: No acute osseous abnormality. IMPRESSION: 1. Increased lower lung/infrahilar predominant interstitial markings, favoring mild interstitial edema over multifocal infection/pneumonia. Electronically signed by: Pinkie Pebbles MD MD 01/23/2024 10:41 PM EST RP Workstation: [id number]   CT Chest Wo Contrast Result Date: 01/22/2024 CLINICAL DATA:  Respiratory illness, nondiagnostic xray, chest pressure, short of breath EXAM: CT CHEST WITHOUT CONTRAST TECHNIQUE: Multidetector CT imaging of the chest was performed following the standard protocol without IV contrast. RADIATION DOSE REDUCTION: This exam was performed according to the departmental dose-optimization program which includes automated exposure control, adjustment of the mA and/or kV according to patient size and/or use of iterative reconstruction technique. COMPARISON:  01/22/2024, 09/07/2023 FINDINGS: Cardiovascular: Unenhanced imaging of the heart demonstrates cardiomegaly without pericardial effusion. Normal caliber of the thoracic aorta. Atherosclerosis of the aorta and coronary vasculature. Assessment of the vascular lumen cannot be performed without intravenous contrast. Mediastinum/Nodes: No enlarged mediastinal or axillary lymph nodes. Thyroid gland, trachea, and esophagus demonstrate no significant findings. Lungs/Pleura: No acute airspace disease, effusion, or pneumothorax. Dependent hypoventilatory changes are noted. Central airways are patent. Upper Abdomen: Since the prior abdominal CT 09/07/2023, there has been development of borderline splenomegaly measuring 12.7 x 11.9 x 6.8 cm. Within the superolateral aspect of the spleen there is a new indeterminate 6.1 x 6.8 x 4.6 cm hypodensity. Musculoskeletal: No acute or destructive bony abnormalities. Reconstructed images demonstrate no  additional findings. IMPRESSION: 1. Cardiomegaly.  No pericardial effusion. 2. No acute airspace disease. 3. Borderline splenomegaly and indeterminate 6.8 cm splenic hypodensity, which have developed in the interim since the 09/07/2023 CT. This is incompletely characterized on this exam, and if further evaluation is clinically indicated a contrasted CT or MRI of the abdomen could be considered. 4. Aortic Atherosclerosis (ICD10-I70.0). Coronary artery atherosclerosis. Electronically Signed   By: Ozell Daring M.D.   On: 01/22/2024 20:00   DG Chest 2 View Result Date: 01/22/2024 CLINICAL DATA:  Chest pain and shortness of breath. EXAM: CHEST - 2 VIEW COMPARISON:  07/19/2023. FINDINGS: The heart is enlarged and the mediastinal contour is within normal limits. Atherosclerotic calcification of the aorta is noted. Mild atelectasis or scarring is present at the left lung base. No consolidation, effusion, or pneumothorax is seen. No acute osseous abnormality. A vascular stent is noted in the axillary region on the left. IMPRESSION: No active cardiopulmonary disease. Electronically Signed   By: Leita Birmingham M.D.   On: 01/22/2024 18:17     ECHO 07/2023: 1. Left ventricular ejection fraction, by estimation, is 55 to 60%. The left ventricle has normal function. The left ventricle has no regional wall  motion abnormalities. The left ventricular internal cavity size was mildly dilated. There is moderate concentric left ventricular hypertrophy. Left ventricular diastolic parameters are consistent with Grade III diastolic dysfunction  (restrictive).   2. Right ventricular systolic function is normal. The right ventricular  size is normal.   3. Left atrial size was moderately dilated.   4. Right atrial size was mild to moderately dilated.   5. The mitral valve is grossly normal. Trivial mitral valve regurgitation.   6. Tricuspid valve regurgitation is mild to moderate.   7. The aortic valve is grossly normal. Aortic  valve regurgitation is  mild. Mild aortic valve stenosis.    TELEMETRY (personally reviewed): Atrial flutter rate 110s  EKG (personally reviewed): Atrial flutter rate 111 bpm, PVCs  DATA reviewed by me 01/24/2024: last 24h vitals tele labs imaging I/O, hospitalist progress note, PCCM notes  Principal Problem:   Chest pain Active Problems:   Chronic atrial fibrillation with RVR (HCC)   Type 2 diabetes mellitus with chronic kidney disease, without long-term current use of insulin  (HCC)   BPH (benign prostatic hyperplasia)   End-stage renal disease on hemodialysis (HCC)   Influenza A    ASSESSMENT AND PLAN: JIMMEY HENGEL is a 84 y.o. 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 Cardiology was consulted for further evaluation.   # Chronic atrial flutter # Acute on chronic HFpEF # ESRD on HD # Anemia Patient presented with worsening shortness of breath and chest tightness.  EKG without acute ischemic changes but did demonstrate atrial flutter which he has a known history of.  Hemoglobin was 4.5 on admission and he received transfusion, up to 9.1 this a.m.  Started on IV heparin . - Continue IV heparin , monitor H&H closely.  Suspect troponin secondary to demand ischemia, no plan for further cardiac invasive evaluation at this time. - Continue amiodarone  100 mg daily.  Dose was decreased at outpatient appointment due to borderline slow heart rate.  Could consider transitioning to IV infusions however rates have remained stable and he appears relatively asymptomatic. - Continue atorvastatin  80 mg daily. - HD as per nephrology, appreciate their recommendations.  This patient's case was discussed and created with Dr. Florencio and he is in agreement.  Signed:  Danita Bloch, PA-C  01/24/2024, 11:41  AM [facility] Cardiology

## 2024-01-24 NOTE — Consult Note (Signed)
 Pharmacy Consult Note - Anticoagulation  Pharmacy Consult for heparin  infusion Indication: chest pain/ACS Allergies[1]  PATIENT MEASUREMENTS: Height: 6' 5 (195.6 cm) Weight:  (bed scale off) IBW/kg (Calculated) : 89.1 HEPARIN  DW (KG): 63.5  VITAL SIGNS: Temp: 98.1 F (36.7 C) (01/12 1115) Temp Source: Oral (01/12 1115) BP: 90/45 (01/12 1330) Pulse Rate: 111 (01/12 1330)  Recent Labs    01/23/24 1859 01/23/24 2200 01/24/24 0317 01/24/24 1005 01/24/24 1419  HGB  --    < > 8.2* 9.1*  --   HCT  --    < > 25.0* 27.7*  --   PLT  --   --  202  --   --   APTT 49*  --  52*  --  47*  LABPROT 25.6*  --   --   --   --   INR 2.2*  --   --   --   --   HEPARINUNFRC >1.10*  --   --   --   --   CREATININE  --   --  5.05*  --   --    < > = values in this interval not displayed.    Estimated Creatinine Clearance: 9.8 mL/min (A) (by C-G formula based on SCr of 5.05 mg/dL (H)).  PAST MEDICAL HISTORY: Past Medical History:  Diagnosis Date   Anxiety    Arthritis    Depression    Diabetes mellitus without complication (HCC)    End stage renal disease (HCC)    Hypertension    Pneumonia    Renal disorder     ASSESSMENT: 84 y.o. male with PMH CHF, A.fib, and ESRD on HD is presenting with SOB abd chest pain. Troponin elevated at 695 on admission. Patient is on Apixaban  for a.fib per chart review. Pharmacy has been consulted to initiate and manage heparin  intravenous infusion.  Last dose of Eliquis : 1/10 in the AM  Goal(s) of therapy: Heparin  level 0.3 - 0.7 units/mL aPTT 66 - 102 seconds Monitor platelets by anticoagulation protocol: Yes   Baseline anticoagulation labs: Recent Labs    01/22/24 1745 01/22/24 1745 01/23/24 0343 01/23/24 0430 01/23/24 0826 01/23/24 1859 01/23/24 2200 01/24/24 0317 01/24/24 1005 01/24/24 1419  APTT  --    < > 48*  --   --  49*  --  52*  --  47*  INR  --   --  2.1*  --   --  2.2*  --   --   --   --   HGB 8.0*  --   --  4.5*   < >  --   7.8* 8.2* 9.1*  --   PLT 209  --   --  117*  --   --   --  202  --   --    < > = values in this interval not displayed.   Baseline HL/aPTT, INR pending. May be elevated given DOAC PTA  0112 0317 aPTT 52, subtherapeutic; 800 un/hr 0112 1419 aPTT 47, subtherapeutic; 950 un/hr  PLAN: --Heparin  2000 unit IV bolus and increase heparin  infusion to 1150 units/hr --Re-check aPTT 8 hours from rate change. HL tomorrow AM to assess for correlation --Daily CBC  Marolyn KATHEE Mare 01/24/2024 2:58 PM    [1] No Known Allergies

## 2024-01-24 NOTE — Progress Notes (Signed)
 Pt receives outpt HD at [facility] MWF 9:45am. Navigator following to assist with any HD needs.  Suzen Satchel Dialysis Navigator [phone]

## 2024-01-24 NOTE — Progress Notes (Signed)
 Bedside nICU 4  Informed consent signed and in chart.    TX duration: 3hrs     Hand-off given to patient's nurse. Nom acute distress note  no [place]/o   Access used: LAVF Access issues: none   Total UF removed: 1.5L Medication(s) given: retacrit  Post HD VS: wnl      Olivia Hurst LPN [facility]

## 2024-01-24 NOTE — Progress Notes (Signed)
 " [facility]  ROUNDING NOTE   Subjective:  Jeffery Joseph, 84 yo male is known to our practice, outpatient dialysis center at [address] And is followed by Dr. Marcelino.  Update:   Patient on heparin  drip this a.m. Being set up for hemodialysis treatment currently. Denies any chest pain at the moment.   Objective:  Vital signs in last 24 hours:  Temp:  [97.7 F (36.5 C)-99.8 F (37.7 C)] 98 F (36.7 C) (01/12 0744) Pulse Rate:  [57-113] 113 (01/12 0744) Resp:  [12-30] 17 (01/12 0744) BP: (79-130)/(36-56) 122/51 (01/12 0744) SpO2:  [93 %-100 %] 100 % (01/12 0744)  Weight change:  Filed Weights   01/22/24 1740  Weight: 63.5 kg    Intake/Output: I/O last 3 completed shifts: In: 1834.2 [I.V.:334.2; IV Piggyback:1500] Out: 475 [Urine:475]   Intake/Output this shift:  No intake/output data recorded.  Physical Exam: General: NAD  Head: Normocephalic  Eyes: Anicteric  Neck: Supple, trachea midline  Lungs:  Clear to auscultation  Heart: Regular rate and rhythm  Abdomen:  Soft, nontender,   Extremities: No peripheral edema.  Neurologic: Alert and awake  Skin: No lesions  Access: Left AVF    Basic Metabolic Panel: Recent Labs  Lab 01/22/24 1745 01/23/24 0323 01/24/24 0317  NA 134* 133* 135  K 3.5 3.4* 3.7  CL 94* 97* 97*  CO2 27 23 25   GLUCOSE 134* 105* 120*  BUN 46* 47* 52*  CREATININE 4.70* 4.68* 5.05*  CALCIUM  8.4* 8.0* 8.5*  MG  --   --  2.0  PHOS  --   --  3.9    Liver Function Tests: Recent Labs  Lab 01/22/24 1745 01/24/24 0317  AST 73* 34  ALT 70* 48*  ALKPHOS 86 66  BILITOT 0.5 0.9  PROT 7.6 6.7  ALBUMIN  3.1* 2.7*   No results for input(s): LIPASE, AMYLASE in the last 168 hours. No results for input(s): AMMONIA in the last 168 hours.  CBC: Recent Labs  Lab 01/22/24 1745 01/23/24 0430 01/23/24 0826 01/23/24 2200 01/24/24 0317  WBC 11.8* 5.8  --   --  8.9  HGB 8.0* 4.5* 7.6* 7.8* 8.2*  HCT 25.1* 15.3*  --  24.0*  25.0*  MCV 76.5* 82.3  --   --  76.7*  PLT 209 117*  --   --  202    Cardiac Enzymes: No results for input(s): CKTOTAL, CKMB, CKMBINDEX, TROPONINI in the last 168 hours.  BNP: Invalid input(s): POCBNP  CBG: Recent Labs  Lab 01/23/24 1250 01/23/24 1726 01/23/24 2110 01/24/24 0123 01/24/24 0747  GLUCAP 89 96 105* 119* 119*    Microbiology: Results for orders placed or performed during the hospital encounter of 01/22/24  Culture, blood (Routine X 2) w Reflex to ID Panel     Status: None (Preliminary result)   Collection Time: 01/23/24 10:33 PM   Specimen: BLOOD RIGHT ARM  Result Value Ref Range Status   Specimen Description BLOOD RIGHT ARM  Final   Special Requests   Final    BOTTLES DRAWN AEROBIC AND ANAEROBIC Blood Culture results may not be optimal due to an inadequate volume of blood received in culture bottles   Culture   Final    NO GROWTH < 12 HOURS Performed at [facility], [address] [place], [place] [address]    Report Status PENDING  Incomplete  Resp panel by RT-PCR (RSV, Flu A&B, Covid) Anterior Nasal Swab     Status: None  Collection Time: 01/23/24 10:33 PM   Specimen: Anterior Nasal Swab  Result Value Ref Range Status   SARS Coronavirus 2 by RT PCR NEGATIVE NEGATIVE Final    Comment: (NOTE) SARS-CoV-2 target nucleic acids are NOT DETECTED.  The SARS-CoV-2 RNA is generally detectable in upper respiratory specimens during the acute phase of infection. The lowest concentration of SARS-CoV-2 viral copies this assay can detect is 138 copies/mL. A negative result does not preclude SARS-Cov-2 infection and should not be used as the sole basis for treatment or other patient management decisions. A negative result may occur with  improper specimen collection/handling, submission of specimen other than nasopharyngeal swab, presence of viral mutation(s) within the areas targeted by this assay, and inadequate number of viral copies(<138  copies/mL). A negative result must be combined with clinical observations, patient history, and epidemiological information. The expected result is Negative.  Fact Sheet for Patients:  bloggercourse.com  Fact Sheet for Healthcare Providers:  seriousbroker.it  This test is no t yet approved or cleared by the [place]  FDA and  has been authorized for detection and/or diagnosis of SARS-CoV-2 by FDA under an Emergency Use Authorization (EUA). This EUA will remain  in effect (meaning this test can be used) for the duration of the COVID-19 declaration under Section 564(b)(1) of the Act, 21 U.S.C.section 360bbb-3(b)(1), unless the authorization is terminated  or revoked sooner.       Influenza A by PCR NEGATIVE NEGATIVE Final   Influenza B by PCR NEGATIVE NEGATIVE Final    Comment: (NOTE) The Xpert Xpress SARS-CoV-2/FLU/RSV plus assay is intended as an aid in the diagnosis of influenza from Nasopharyngeal swab specimens and should not be used as a sole basis for treatment. Nasal washings and aspirates are unacceptable for Xpert Xpress SARS-CoV-2/FLU/RSV testing.  Fact Sheet for Patients: bloggercourse.com  Fact Sheet for Healthcare Providers: seriousbroker.it  This test is not yet approved or cleared by the [place]  FDA and has been authorized for detection and/or diagnosis of SARS-CoV-2 by FDA under an Emergency Use Authorization (EUA). This EUA will remain in effect (meaning this test can be used) for the duration of the COVID-19 declaration under Section 564(b)(1) of the Act, 21 U.S.C. section 360bbb-3(b)(1), unless the authorization is terminated or revoked.     Resp Syncytial Virus by PCR NEGATIVE NEGATIVE Final    Comment: (NOTE) Fact Sheet for Patients: bloggercourse.com  Fact Sheet for Healthcare  Providers: seriousbroker.it  This test is not yet approved or cleared by the [place]  FDA and has been authorized for detection and/or diagnosis of SARS-CoV-2 by FDA under an Emergency Use Authorization (EUA). This EUA will remain in effect (meaning this test can be used) for the duration of the COVID-19 declaration under Section 564(b)(1) of the Act, 21 U.S.C. section 360bbb-3(b)(1), unless the authorization is terminated or revoked.  Performed at [facility], [address] [place], [place] [address]   Respiratory (~20 pathogens) panel by PCR     Status: None   Collection Time: 01/23/24 10:33 PM   Specimen: Nasopharyngeal Swab; Respiratory  Result Value Ref Range Status   Adenovirus NOT DETECTED NOT DETECTED Final   Coronavirus 229E NOT DETECTED NOT DETECTED Final    Comment: (NOTE) The Coronavirus on the Respiratory Panel, DOES NOT test for the novel  Coronavirus (2019 nCoV)    Coronavirus HKU1 NOT DETECTED NOT DETECTED Final   Coronavirus NL63 NOT DETECTED NOT DETECTED Final   Coronavirus OC43 NOT DETECTED NOT DETECTED Final   Metapneumovirus  NOT DETECTED NOT DETECTED Final   Rhinovirus / Enterovirus NOT DETECTED NOT DETECTED Final   Influenza A NOT DETECTED NOT DETECTED Final   Influenza B NOT DETECTED NOT DETECTED Final   Parainfluenza Virus 1 NOT DETECTED NOT DETECTED Final   Parainfluenza Virus 2 NOT DETECTED NOT DETECTED Final   Parainfluenza Virus 3 NOT DETECTED NOT DETECTED Final   Parainfluenza Virus 4 NOT DETECTED NOT DETECTED Final   Respiratory Syncytial Virus NOT DETECTED NOT DETECTED Final   Bordetella pertussis NOT DETECTED NOT DETECTED Final   Bordetella Parapertussis NOT DETECTED NOT DETECTED Final   Chlamydophila pneumoniae NOT DETECTED NOT DETECTED Final   Mycoplasma pneumoniae NOT DETECTED NOT DETECTED Final    Comment: Performed at [facility], [address], [place], [place] [address]  Culture, blood  (Routine X 2) w Reflex to ID Panel     Status: None (Preliminary result)   Collection Time: 01/24/24 12:14 AM   Specimen: BLOOD LEFT HAND  Result Value Ref Range Status   Specimen Description BLOOD LEFT HAND  Final   Special Requests   Final    BOTTLES DRAWN AEROBIC AND ANAEROBIC Blood Culture results may not be optimal due to an inadequate volume of blood received in culture bottles   Culture   Final    NO GROWTH < 12 HOURS Performed at [facility], [address] [place], [place] [address]    Report Status PENDING  Incomplete  MRSA Next Gen by PCR, Nasal     Status: None   Collection Time: 01/24/24  2:03 AM   Specimen: Nasal Mucosa; Nasal Swab  Result Value Ref Range Status   MRSA by PCR Next Gen NOT DETECTED NOT DETECTED Final    Comment: (NOTE) The GeneXpert MRSA Assay (FDA approved for NASAL specimens only), is one component of a comprehensive MRSA colonization surveillance program. It is not intended to diagnose MRSA infection nor to guide or monitor treatment for MRSA infections. Test performance is not FDA approved in patients less than 21 years old. Performed at [facility], [address] [place], [place] [address]     Coagulation Studies: Recent Labs    01/23/24 0343 01/23/24 1859  LABPROT 24.6* 25.6*  INR 2.1* 2.2*    Urinalysis: No results for input(s): COLORURINE, LABSPEC, PHURINE, GLUCOSEU, HGBUR, BILIRUBINUR, KETONESUR, PROTEINUR, UROBILINOGEN, NITRITE, LEUKOCYTESUR in the last 72 hours.  Invalid input(s): APPERANCEUR    Imaging: DG Chest 1 View Result Date: 01/23/2024 EXAM: 1 VIEW(S) XRAY OF THE CHEST 01/23/2024 10:37:00 PM COMPARISON: 01/22/2024 CLINICAL HISTORY: Lethargy FINDINGS: LINES, TUBES AND DEVICES: Left axillary stent noted. LUNGS AND PLEURA: Increased interstitial markings, lower lung/infrahilar predominant, favoring mild interstitial edema although multifocal infection/pneumonia is also possible.  No pleural effusion. No pneumothorax. HEART AND MEDIASTINUM: Stable cardiomegaly. Aortic atherosclerosis. BONES AND SOFT TISSUES: No acute osseous abnormality. IMPRESSION: 1. Increased lower lung/infrahilar predominant interstitial markings, favoring mild interstitial edema over multifocal infection/pneumonia. Electronically signed by: Pinkie Pebbles MD MD 01/23/2024 10:41 PM EST RP Workstation: [id number]   CT Chest Wo Contrast Result Date: 01/22/2024 CLINICAL DATA:  Respiratory illness, nondiagnostic xray, chest pressure, short of breath EXAM: CT CHEST WITHOUT CONTRAST TECHNIQUE: Multidetector CT imaging of the chest was performed following the standard protocol without IV contrast. RADIATION DOSE REDUCTION: This exam was performed according to the departmental dose-optimization program which includes automated exposure control, adjustment of the mA and/or kV according to patient size and/or use of iterative reconstruction technique. COMPARISON:  01/22/2024, 09/07/2023 FINDINGS:  Cardiovascular: Unenhanced imaging of the heart demonstrates cardiomegaly without pericardial effusion. Normal caliber of the thoracic aorta. Atherosclerosis of the aorta and coronary vasculature. Assessment of the vascular lumen cannot be performed without intravenous contrast. Mediastinum/Nodes: No enlarged mediastinal or axillary lymph nodes. Thyroid gland, trachea, and esophagus demonstrate no significant findings. Lungs/Pleura: No acute airspace disease, effusion, or pneumothorax. Dependent hypoventilatory changes are noted. Central airways are patent. Upper Abdomen: Since the prior abdominal CT 09/07/2023, there has been development of borderline splenomegaly measuring 12.7 x 11.9 x 6.8 cm. Within the superolateral aspect of the spleen there is a new indeterminate 6.1 x 6.8 x 4.6 cm hypodensity. Musculoskeletal: No acute or destructive bony abnormalities. Reconstructed images demonstrate no additional findings. IMPRESSION: 1.  Cardiomegaly.  No pericardial effusion. 2. No acute airspace disease. 3. Borderline splenomegaly and indeterminate 6.8 cm splenic hypodensity, which have developed in the interim since the 09/07/2023 CT. This is incompletely characterized on this exam, and if further evaluation is clinically indicated a contrasted CT or MRI of the abdomen could be considered. 4. Aortic Atherosclerosis (ICD10-I70.0). Coronary artery atherosclerosis. Electronically Signed   By: Ozell Daring M.D.   On: 01/22/2024 20:00   DG Chest 2 View Result Date: 01/22/2024 CLINICAL DATA:  Chest pain and shortness of breath. EXAM: CHEST - 2 VIEW COMPARISON:  07/19/2023. FINDINGS: The heart is enlarged and the mediastinal contour is within normal limits. Atherosclerotic calcification of the aorta is noted. Mild atelectasis or scarring is present at the left lung base. No consolidation, effusion, or pneumothorax is seen. No acute osseous abnormality. A vascular stent is noted in the axillary region on the left. IMPRESSION: No active cardiopulmonary disease. Electronically Signed   By: Leita Birmingham M.D.   On: 01/22/2024 18:17     Medications:    ceFEPime  (MAXIPIME ) IV     heparin  950 Units/hr (01/24/24 0700)   metronidazole  Stopped (01/24/24 0417)   norepinephrine  (LEVOPHED ) Adult infusion 7 mcg/min (01/24/24 0753)   sodium chloride      vancomycin       amiodarone   100 mg Oral Daily   atorvastatin   80 mg Oral Daily   Chlorhexidine  Gluconate Cloth  6 each Topical Daily   cholecalciferol   5,000 Units Oral Daily   diclofenac  Sodium  2 g Topical QID   docusate sodium   100 mg Oral QHS   donepezil   10 mg Oral QHS   epoetin  alfa-epbx (RETACRIT ) injection  10,000 Units Subcutaneous Once   famotidine   20 mg Oral Daily   finasteride   5 mg Oral Daily   insulin  aspart  0-15 Units Subcutaneous TID WC   insulin  aspart  0-5 Units Subcutaneous QHS   midodrine   10 mg Oral TID WC   oseltamivir   30 mg Oral Q M,W,F-1800   tamsulosin   0.4 mg  Oral Daily   acetaminophen  **OR** acetaminophen , ALPRAZolam , lidocaine  (PF), lidocaine -prilocaine , magnesium  hydroxide, morphine  injection, nitroGLYCERIN , ondansetron  **OR** ondansetron  (ZOFRAN ) IV, pentafluoroprop-tetrafluoroeth, traZODone   Assessment/ Plan:  Jeffery Joseph is a 84 y.o.  male  with ESRD on hemodialysis, hypertension, diabetes mellitus type II, BPH, chronic afib, peripheral arterial disease, and diastolic congestive heart failure who presented to hospital with atypical chest pain found to have hgb 4.5 and elevated troponin.   Outpatient dialysis [facility] [address]/TTS/AVF and is followed by Dr. Marcelino  End stage Renal Disease on hemodialysis  Patient being planned for hemodialysis treatment today as he underwent recent blood administration.  We will reassess the patient for dialysis treatment tomorrow as well.  Anemia with chronic kidney disease and suspected acute blood loss.  Hgb 4.5, POA. Suspect GI bleed. Fecal occult positive. Patient received one unit of blood.  Hemoglobin now up to 8.2 posttransfusion.  Latest Reference Range & Units 01/23/24 03:23  Iron 45 - 182 ug/dL 29 (L)  UIBC ug/dL 850  TIBC 749 - 549 ug/dL 821 (L)  Saturation Ratios 17.9 - 39.5 % 16 (L)  Ferritin 24 - 336 ng/mL 882 (H)  (L): Data is abnormally low (H): Data is abnormally high    Hypertension with chronic kidney disease  Blood pressure currently 122/51.  Currently on norepinephrine .  Metoprolol  held.  Diabetes mellitus type II with chronic kidney disease  Most recent A1C 5.9 01/23/24. SSI managed by primary team  5. Chronic Afib  Patient on amiodarone .   LOS: 1 Tatum Massman 1/12/20268:02 AM  "

## 2024-01-24 NOTE — Plan of Care (Signed)
" °  Problem: Nutritional: Goal: Maintenance of adequate nutrition will improve Outcome: Progressing   Problem: Tissue Perfusion: Goal: Adequacy of tissue perfusion will improve Outcome: Progressing   Problem: Clinical Measurements: Goal: Cardiovascular complication will be avoided Outcome: Progressing   Problem: Nutrition: Goal: Adequate nutrition will be maintained Outcome: Progressing   Problem: Coping: Goal: Level of anxiety will decrease Outcome: Progressing   "

## 2024-01-24 NOTE — Consult Note (Signed)
 Pharmacy Consult Note - Anticoagulation  Pharmacy Consult for heparin  infusion Indication: chest pain/ACS Allergies[1]  PATIENT MEASUREMENTS: Height: 6' 5 (195.6 cm) Weight: 63.5 kg (140 lb) IBW/kg (Calculated) : 89.1 HEPARIN  DW (KG): 63.5  VITAL SIGNS: Temp: 97.7 F (36.5 C) (01/12 0122) Temp Source: Axillary (01/12 0122) BP: 107/52 (01/12 0345) Pulse Rate: 109 (01/12 0315)  Recent Labs    01/23/24 1859 01/23/24 2200 01/24/24 0317  HGB  --    < > 8.2*  HCT  --    < > 25.0*  PLT  --   --  202  APTT 49*  --  52*  LABPROT 25.6*  --   --   INR 2.2*  --   --   HEPARINUNFRC >1.10*  --   --   CREATININE  --   --  5.05*   < > = values in this interval not displayed.    Estimated Creatinine Clearance: 9.8 mL/min (A) (by C-G formula based on SCr of 5.05 mg/dL (H)).  PAST MEDICAL HISTORY: Past Medical History:  Diagnosis Date   Anxiety    Arthritis    Depression    Diabetes mellitus without complication (HCC)    End stage renal disease (HCC)    Hypertension    Pneumonia    Renal disorder     Medications:  Medications Prior to Admission  Medication Sig Dispense Refill Last Dose/Taking   acetaminophen  (TYLENOL ) 325 MG tablet Take 650 mg by mouth every 6 (six) hours as needed.   01/22/2024   ALPRAZolam  (XANAX ) 0.5 MG tablet Take 0.5 mg by mouth 3 (three) times daily as needed for sleep.   01/22/2024 Morning   amiodarone  (PACERONE ) 200 MG tablet Take 2 tablets (400 mg total) by mouth 2 (two) times daily for 7 days, THEN 1 tablet (200 mg total) daily. 58 tablet 0 01/22/2024 Morning   apixaban  (ELIQUIS ) 2.5 MG TABS tablet Take 1 tablet (2.5 mg total) by mouth 2 (two) times daily. 60 tablet 0 01/22/2024 Morning   Cholecalciferol  125 MCG (5000 UT) TABS Take 5,000 Units by mouth daily.   01/22/2024   clopidogrel  (PLAVIX ) 75 MG tablet Take 1 tablet (75 mg total) by mouth daily. 30 tablet 11 01/22/2024   diclofenac  Sodium (VOLTAREN ) 1 % GEL Apply 2 g topically 4 (four) times daily.  100 g 0 Taking   Docusate Sodium  (DSS) 100 MG CAPS Take 100 mg by mouth at bedtime.   01/21/2024   donepezil  (ARICEPT ) 10 MG tablet Take 10 mg by mouth at bedtime.   01/21/2024 Evening   finasteride  (PROSCAR ) 5 MG tablet Take 1 tablet (5 mg total) by mouth daily. 90 tablet 3 01/22/2024 Morning   lidocaine -prilocaine  (EMLA ) cream Apply 1 Application topically as needed (Dialysis).   Taking As Needed   metoprolol  succinate (TOPROL -XL) 25 MG 24 hr tablet Take 0.5 tablets (12.5 mg total) by mouth daily. 30 tablet 0 01/22/2024 Noon   tamsulosin  (FLOMAX ) 0.4 MG CAPS capsule Take 1 capsule (0.4 mg total) by mouth daily. 90 capsule 3 01/22/2024 Morning   enalapril  (VASOTEC ) 20 MG tablet Take 20 mg by mouth 2 (two) times daily. (Patient not taking: Reported on 01/20/2024)      furosemide  (LASIX ) 40 MG tablet Take 1 tablet (40 mg total) by mouth every other day. (Patient not taking: Reported on 01/20/2024) 30 tablet 0    glipiZIDE  (GLUCOTROL  XL) 5 MG 24 hr tablet Take by mouth. (Patient not taking: Reported on 01/20/2024)  LOKELMA 10 g PACK packet Take 1 packet by mouth daily. (Patient not taking: Reported on 01/20/2024)      sodium bicarbonate  650 MG tablet Take 650 mg by mouth 2 (two) times daily. (Patient not taking: Reported on 01/20/2024)      Scheduled:   amiodarone   100 mg Oral Daily   atorvastatin   80 mg Oral Daily   Chlorhexidine  Gluconate Cloth  6 each Topical Daily   cholecalciferol   5,000 Units Oral Daily   diclofenac  Sodium  2 g Topical QID   docusate sodium   100 mg Oral QHS   donepezil   10 mg Oral QHS   epoetin  alfa-epbx (RETACRIT ) injection  10,000 Units Subcutaneous Once   famotidine   20 mg Oral Daily   finasteride   5 mg Oral Daily   insulin  aspart  0-15 Units Subcutaneous TID WC   insulin  aspart  0-5 Units Subcutaneous QHS   metoprolol  succinate  12.5 mg Oral Daily   midodrine   10 mg Oral TID WC   oseltamivir   30 mg Oral Q M,W,F-1800   tamsulosin   0.4 mg Oral Daily   Infusions:   ceFEPime   (MAXIPIME ) IV     Followed by   ceFEPime  (MAXIPIME ) IV     heparin  800 Units/hr (01/24/24 0200)   metronidazole  500 mg (01/24/24 0317)   norepinephrine  (LEVOPHED ) Adult infusion 7 mcg/min (01/24/24 0200)   sodium chloride      vancomycin      PRN: acetaminophen  **OR** acetaminophen , ALPRAZolam , lidocaine  (PF), lidocaine -prilocaine , magnesium  hydroxide, morphine  injection, nitroGLYCERIN , ondansetron  **OR** ondansetron  (ZOFRAN ) IV, pentafluoroprop-tetrafluoroeth, traZODone  Anti-infectives (From admission, onward)    Start     Dose/Rate Route Frequency Ordered Stop   01/24/24 1800  oseltamivir  (TAMIFLU ) capsule 30 mg        30 mg Oral Every M-W-F (1800) 01/23/24 0125 01/28/24 1759   01/24/24 1800  ceFEPIme  (MAXIPIME ) 1 g in sodium chloride  0.9 % 100 mL IVPB       Placed in Followed by Linked Group   1 g 200 mL/hr over 30 Minutes Intravenous Daily-1800 01/24/24 0124     01/24/24 1800  vancomycin  (VANCOREADY) IVPB 750 mg/150 mL        750 mg 150 mL/hr over 60 Minutes Intravenous Every M-W-F (Hemodialysis) 01/24/24 0126     01/24/24 0215  ceFEPIme  (MAXIPIME ) 1 g in sodium chloride  0.9 % 100 mL IVPB       Placed in Followed by Linked Group   1 g 200 mL/hr over 30 Minutes Intravenous  Once 01/24/24 0124     01/24/24 0215  vancomycin  (VANCOREADY) IVPB 1500 mg/300 mL        1,500 mg 150 mL/hr over 120 Minutes Intravenous  Once 01/24/24 0125 01/24/24 0426   01/24/24 0100  metroNIDAZOLE  (FLAGYL ) IVPB 500 mg        500 mg 100 mL/hr over 60 Minutes Intravenous Every 12 hours 01/24/24 0050     01/23/24 0130  oseltamivir  (TAMIFLU ) capsule 30 mg        30 mg Oral  Once 01/23/24 0105 01/23/24 0155       ASSESSMENT: 84 y.o. male with PMH CHF, A.fib, and ESRD on HD is presenting with SOB abd chest pain. Troponin elevated at 695 on admission. Patient is on Apixaban  for a.fib per chart review. Pharmacy has been consulted to initiate and manage heparin  intravenous infusion.  Last dose of Eliquis :  1/10 in the AM  Goal(s) of therapy: Heparin  level 0.3 - 0.7 units/mL aPTT 66 - 102 seconds  Monitor platelets by anticoagulation protocol: Yes   Baseline anticoagulation labs: Recent Labs    01/22/24 1745 01/23/24 0343 01/23/24 0430 01/23/24 0826 01/23/24 1859 01/23/24 2200 01/24/24 0317  APTT  --  48*  --   --  49*  --  52*  INR  --  2.1*  --   --  2.2*  --   --   HGB 8.0*  --  4.5* 7.6*  --  7.8* 8.2*  PLT 209  --  117*  --   --   --  202   Baseline HL/aPTT, INR pending. May be elevated given DOAC PTA  01/12 0317 aPTT 52, subtherapeutic  PLAN:  Bolus 1900 units x 1 Increase heparin  infusion to 950 units/hr Recheck aPTT in 8 hrs after rate change Continue to titrate by aPTT until heparin  level and aPTT correlate, then titrate by heparin  level alone. Check heparin  level with next AM labs. Continue to monitor CBC daily while on heparin  infusion.  Rankin CANDIE Dills, PharmD, [facility] 01/24/2024 4:45 AM       [1] No Known Allergies

## 2024-01-24 NOTE — Plan of Care (Signed)
 Patient admitted to room ICU04. Patient from home with family. Originally came to ED altered and hypotensive c/o shortness of breath with chest pain and abdominal discomfort with decreased PO intake. On arrival to ICU04 patient denies pain and was alert and oriented to self, place, year, and situation. Levo infusing on arrival at 7.5 mcg/min. BP currently stable. Heparin  infusing at 800 units/hr. Daughter is at bedside. Antibiotics infused per order shortly after arriving to ICU04. Pressure injury noted to left and right sacrum/coccyx each measuring 1.5 cm X 1.5 cm left and 1.5 cm X 1.5 CM right, cleansed, sacral foam applied, added to LDA, and standing skin care orders entered. Patient no longer altered, NP notified and verbal order obtained for Renal diet. Patient c/o belching and reflux, NP notified, new order entered for Pepsid 20 mg PO daily. IV team consulted for 3rd PIV due to multiple antibiotics not compatible with each other with continuous infusions. Patient is a DNR-Limited. High fall risk. Daughter at bedside.

## 2024-01-24 NOTE — Consult Note (Addendum)
 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 Symptoms began several days prior to presentation. Patient was taking oseltamivir  for possible influenza. He reported dry cough and denied nausea, vomiting, abdominal pain, diarrhea, melena, or hematochezia. Last hemodialysis session was on Friday prior to admission.  ED Course: On arrival, vital signs: BP 106/49 mmHg, HR 111 bpm, RR 20, T 40F, SpO? 100% on room air. Initial labs showed WBC 11.8 K/L, Hgb 8.0 g/dL, platelets 790 K/L, sodium 134 mmol/L, potassium 3.5 mmol/L, BUN 46 mg/dL, creatinine 4.7 mg/dL, lactate 2.2 mmol/L, AST 73 U/L, ALT 70 U/L. Chest X-ray demonstrated cardiomegaly without acute consolidation. CT chest without contrast showed cardiomegaly and no acute airspace disease. Patient received 500 mL IV normal saline bolus, famotidine , and antacids and was admitted to TRH service for observation.  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), T2DM, HTN, PAD, chronic atrial fibrillation on anticoagulation, chronic anemia, anxiety/depression, and BPH   Significant Hospital Events   1/11: Admitted to trh service 1/12: Became hypotensive started on levophed , critical care consulted  Consults:  Nephrology PCCM  Procedures:  None  Interim History / Subjective:    -see significant events   Micro Data:  1/12: SARS-CoV-2 PCR>  1/12: Influenza PCR>  1/12: Respiratory Viral Panel> 1/12: Blood culture x2> 1/12: MRSA PCR>>    Antimicrobials:  Vancomycin  1/12> Cefepime  1/12> Metronidazole  1/12>  OBJECTIVE  Blood pressure (!) 107/52, pulse (!) 109, temperature 97.7 F (36.5 C), temperature source Axillary, resp. rate 20, height 6' 5 (1.956 m), weight 63.5 kg, SpO2 99%.        Intake/Output Summary (Last 24 hours) at 01/24/2024 0411 Last data filed at 01/24/2024 0200 Gross per 24 hour  Intake 641.96 ml  Output 325 ml  Net 316.96 ml   Filed Weights   01/22/24 1740  Weight: 63.5 kg   Physical Examination  GEN: Critically ill patient, WDWN in NAD HEENT: [place]/AT. PERRL, sclerae anicteric. HEART: regular rhythm, normal rate, S1, S2, no M/R/G,  LUNGS: CTAB, mild crackles without wheezes, no increased WOB,  EXTREMITIES: trace Edema, cap refill  NEURO: No gross focal deficits. PSYCH:  Mood and Affect: intermittent confusion ABDOMINAL: Soft: BS x 4, NTND SKIN: Intact, warm, no rashes lesion, or ulcer  Active Hospital Problem list   See systems below  Assessment & Plan:  #Shock - Septic vs Mixed (Septic/Cardiogenic) -NE gtt, MAP >=65 -Cautious IVF (ESRD, CM) -Vanc/Cefepime /Flagyl  -F/u BCx, MRSA PCR, viral studies -Trend lactate, PCT -ID c/s if +Cx or unclear source  #NSTEMI - Type II (Likely Demand) -Heparin  gtt (monitor anemia) -Atorvastatin  80 mg -NTG PRN if BP allows -Cards consult -Trend trops -Avoid BB uptitration in shock  #Chronic Afib -Continue amio -Heparin  gtt per Cards -Hold Eliquis  Resume DOAC when stable/bleeding risk ?  #Acute on Chronic Anemia Likely multifactorial: ESRD, chronic disease, possible hemodilution, anticoagulation-related. No overt bleeding. Hgb nadir 4.5 ? ~8.2 -Monitor CBC -PRBC for Hgb <7 or sx -Continue EPO -Monitor occult bleed -Balance AC  #ESRD on HD (MWF) Last  HD 1/10. Volume status tenuous in shock. -Nephrology consult -Resume HD when stable (CRRT vs modified HD) -Daily BMP/Mg/Phos -Avoid nephrotoxins -Renal-dose meds  #Acute Hypoxic RF -  Mild CXR- without acute airspace disease -O? PRN -Pulm hygiene -Volume control via HD -Monitor for ARDS/PNA  #HTN / HLD -Hold antihypertensives -Consider low-dose metoprolol  if tolerated -Continue statin  #Anxiety / Depression -Continue mirtazapine -PRN anxiolytics w/ caution  #Incidental Splenic Lesion No acute intervention -Consider CT w/ contrast or MRI when stable -OP f/u  T2DM A1c 5.9. Mild stress hyperglycemia. -CBG q4h -Goal 140-180 -SSI -ICU glycemic protocol  Best practice:  Diet:  Oral Pain/Anxiety/Delirium protocol (if indicated): No VAP protocol (if indicated): Not indicated DVT prophylaxis: Subcutaneous Heparin  GI prophylaxis: H2B Glucose control:  SSI Yes Central venous access:  N/A Arterial line:  N/A Foley:  N/A Mobility:  bed rest  PT/OT consulted: N/A Code Status:  DNR Disposition: ICU   = Goals of Care =  Primary Emergency Contact: [place], [place], Home Phone: [phone]   Labs/imaging that I havepersonally reviewed  (right click and Reselect all SmartList Selections daily)   DG Chest 1 View Result Date: 01/23/2024 EXAM: 1 VIEW(S) XRAY OF THE CHEST 01/23/2024 10:37:00 PM COMPARISON: 01/22/2024 CLINICAL HISTORY: Lethargy FINDINGS: LINES, TUBES AND DEVICES: Left axillary stent noted. LUNGS AND PLEURA: Increased interstitial markings, lower lung/infrahilar predominant, favoring mild interstitial edema although multifocal infection/pneumonia is also possible. No pleural effusion. No pneumothorax. HEART AND MEDIASTINUM: Stable cardiomegaly. Aortic atherosclerosis. BONES AND SOFT TISSUES: No acute osseous abnormality. IMPRESSION: 1. Increased lower lung/infrahilar predominant interstitial markings, favoring mild interstitial edema over multifocal infection/pneumonia. Electronically signed by: Pinkie Pebbles MD MD 01/23/2024 10:41 PM EST RP Workstation: [id number]   CT Chest Wo Contrast Result Date: 01/22/2024 CLINICAL DATA:  Respiratory  illness, nondiagnostic xray, chest pressure, short of breath EXAM: CT CHEST WITHOUT CONTRAST TECHNIQUE: Multidetector CT imaging of the chest was performed following the standard protocol without IV contrast. RADIATION DOSE REDUCTION: This exam was performed according to the departmental dose-optimization program which includes automated exposure control, adjustment of the mA and/or kV according to patient size and/or use of iterative reconstruction technique. COMPARISON:  01/22/2024, 09/07/2023 FINDINGS: Cardiovascular: Unenhanced imaging of the heart demonstrates cardiomegaly without pericardial effusion. Normal caliber of the thoracic aorta. Atherosclerosis of the aorta and coronary vasculature. Assessment of the vascular lumen cannot be performed without intravenous contrast. Mediastinum/Nodes: No enlarged mediastinal or axillary lymph nodes. Thyroid gland, trachea, and esophagus demonstrate no significant findings. Lungs/Pleura: No acute airspace disease, effusion, or pneumothorax. Dependent hypoventilatory changes are noted. Central airways are patent. Upper Abdomen: Since the prior abdominal CT 09/07/2023, there has been development of borderline splenomegaly measuring 12.7 x 11.9 x 6.8 cm. Within the superolateral aspect of the spleen there is a new indeterminate 6.1 x 6.8 x 4.6 cm hypodensity. Musculoskeletal: No acute or destructive bony abnormalities. Reconstructed images demonstrate no additional findings. IMPRESSION: 1. Cardiomegaly.  No pericardial effusion. 2. No acute airspace disease. 3. Borderline splenomegaly and indeterminate 6.8 cm splenic hypodensity, which have developed in the interim since the 09/07/2023 CT. This is incompletely characterized on this exam, and if further evaluation is clinically indicated a contrasted CT or MRI of the abdomen could be considered. 4. Aortic Atherosclerosis (ICD10-I70.0). Coronary artery atherosclerosis. Electronically Signed   By: Ozell Daring M.D.   On:  01/22/2024 20:00   DG Chest 2 View Result Date: 01/22/2024 CLINICAL DATA:  Chest pain and shortness of breath. EXAM: CHEST - 2 VIEW COMPARISON:  07/19/2023. FINDINGS:  The heart is enlarged and the mediastinal contour is within normal limits. Atherosclerotic calcification of the aorta is noted. Mild atelectasis or scarring is present at the left lung base. No consolidation, effusion, or pneumothorax is seen. No acute osseous abnormality. A vascular stent is noted in the axillary region on the left. IMPRESSION: No active cardiopulmonary disease. Electronically Signed   By: Leita Birmingham M.D.   On: 01/22/2024 18:17     Labs   CBC: Recent Labs  Lab 01/22/24 1745 01/23/24 0430 01/23/24 0826 01/23/24 2200 01/24/24 0317  WBC 11.8* 5.8  --   --  8.9  HGB 8.0* 4.5* 7.6* 7.8* 8.2*  HCT 25.1* 15.3*  --  24.0* 25.0*  MCV 76.5* 82.3  --   --  76.7*  PLT 209 117*  --   --  202    Basic Metabolic Panel: Recent Labs  Lab 01/22/24 1745 01/23/24 0323 01/24/24 0317  NA 134* 133* 135  K 3.5 3.4* 3.7  CL 94* 97* 97*  CO2 27 23 25   GLUCOSE 134* 105* 120*  BUN 46* 47* 52*  CREATININE 4.70* 4.68* 5.05*  CALCIUM  8.4* 8.0* 8.5*  MG  --   --  2.0  PHOS  --   --  3.9   GFR: Estimated Creatinine Clearance: 9.8 mL/min (A) (by C-G formula based on SCr of 5.05 mg/dL (H)). Recent Labs  Lab 01/22/24 1745 01/23/24 0323 01/23/24 0430 01/23/24 2200 01/24/24 0317  PROCALCITON  --   --   --  100.00  --   WBC 11.8*  --  5.8  --  8.9  LATICACIDVEN 2.2* 1.5  --  1.4  --     Liver Function Tests: Recent Labs  Lab 01/22/24 1745 01/24/24 0317  AST 73* 34  ALT 70* 48*  ALKPHOS 86 66  BILITOT 0.5 0.9  PROT 7.6 6.7  ALBUMIN  3.1* 2.7*   No results for input(s): LIPASE, AMYLASE in the last 168 hours. No results for input(s): AMMONIA in the last 168 hours.  ABG    Component Value Date/Time   HCO3 27.2 01/23/2024 2300   TCO2 17 (L) 11/14/2021 0718   O2SAT 81.4 01/23/2024 2300     Coagulation Profile: Recent Labs  Lab 01/23/24 0343 01/23/24 1859  INR 2.1* 2.2*   Cardiac Enzymes: No results for input(s): CKTOTAL, CKMB, CKMBINDEX, TROPONINI in the last 168 hours.  HbA1C: Hgb A1c MFr Bld  Date/Time Value Ref Range Status  01/23/2024 04:30 AM 5.9 (H) 4.8 - 5.6 % Final    Comment:    (NOTE) Diagnosis of Diabetes The following HbA1c ranges recommended by the American Diabetes Association (ADA) may be used as an aid in the diagnosis of diabetes mellitus.  Hemoglobin             Suggested A1C NGSP%              Diagnosis  <5.7                   Non Diabetic  5.7-6.4                Pre-Diabetic  >6.4                   Diabetic  <7.0                   Glycemic control for  adults with diabetes.    07/18/2023 04:54 AM 5.2 4.8 - 5.6 % Final    Comment:    (NOTE) Diagnosis of Diabetes The following HbA1c ranges recommended by the American Diabetes Association (ADA) may be used as an aid in the diagnosis of diabetes mellitus.  Hemoglobin             Suggested A1C NGSP%              Diagnosis  <5.7                   Non Diabetic  5.7-6.4                Pre-Diabetic  >6.4                   Diabetic  <7.0                   Glycemic control for                       adults with diabetes.     CBG: Recent Labs  Lab 01/23/24 0911 01/23/24 1250 01/23/24 1726 01/23/24 2110 01/24/24 0123  GLUCAP 98 89 96 105* 119*   Review of Systems:   Unable to be obtained secondary to the patient's not a good historian  Past Medical History  He,  has a past medical history of Anxiety, Arthritis, Depression, Diabetes mellitus without complication (HCC), End stage renal disease (HCC), Hypertension, Pneumonia, and Renal disorder.   Surgical History    Past Surgical History:  Procedure Laterality Date   A/V FISTULAGRAM Left 06/08/2023   Procedure: A/V Fistulagram;  Surgeon: Jama Cordella MATSU, MD;  Location: [facility] INVASIVE CV  LAB;  Service: Cardiovascular;  Laterality: Left;   A/V SHUNT INTERVENTION Left 09/29/2023   Procedure: A/V SHUNT INTERVENTION;  Surgeon: Marea Selinda RAMAN, MD;  Location: [facility] INVASIVE CV LAB;  Service: Cardiovascular;  Laterality: Left;   APPLICATION OF WOUND VAC Right 11/14/2021   Procedure: APPLICATION OF WOUND VAC;  Surgeon: Jama Cordella MATSU, MD;  Location: [facility] ORS;  Service: Vascular;  Laterality: Right;   AV FISTULA PLACEMENT Left 11/14/2021   Procedure: ARTERIOVENOUS (AV) FISTULA CREATION ( BRACHIAL CEPHALIC);  Surgeon: Jama Cordella MATSU, MD;  Location: [facility] ORS;  Service: Vascular;  Laterality: Left;     Social History   reports that he has never smoked. He has never used smokeless tobacco. He reports that he does not currently use drugs. He reports that he does not drink alcohol.   Family History   His family history is not on file.   Allergies Allergies[1]   Home Medications  Prior to Admission medications  Medication Sig Start Date End Date Taking? Authorizing Provider  acetaminophen  (TYLENOL ) 325 MG tablet Take 650 mg by mouth every 6 (six) hours as needed.   Yes [provider]  ALPRAZolam  (XANAX ) 0.5 MG tablet Take 0.5 mg by mouth 3 (three) times daily as needed for sleep. 02/05/21  Yes [provider]  amiodarone  (PACERONE ) 200 MG tablet Take 2 tablets (400 mg total) by mouth 2 (two) times daily for 7 days, THEN 1 tablet (200 mg total) daily. 07/24/23 01/22/24 Yes Sreenath, Sudheer B, MD  apixaban  (ELIQUIS ) 2.5 MG TABS tablet Take 1 tablet (2.5 mg total) by mouth 2 (two) times daily. 07/24/23 01/23/24 Yes Sreenath, Sudheer B, MD  Cholecalciferol  125 MCG (5000 UT) TABS Take 5,000 Units by mouth daily.  Yes [provider]  clopidogrel  (PLAVIX ) 75 MG tablet Take 1 tablet (75 mg total) by mouth daily. 06/09/23  Yes Schnier, Cordella MATSU, MD  diclofenac  Sodium (VOLTAREN ) 1 % GEL Apply 2 g topically 4 (four) times daily. 07/24/23  Yes Sreenath, Sudheer B, MD   Docusate Sodium  (DSS) 100 MG CAPS Take 100 mg by mouth at bedtime.   Yes [provider]  donepezil  (ARICEPT ) 10 MG tablet Take 10 mg by mouth at bedtime. 05/14/21  Yes [provider]  finasteride  (PROSCAR ) 5 MG tablet Take 1 tablet (5 mg total) by mouth daily. 08/12/19  Yes Stoioff, Glendia BROCKS, MD  lidocaine -prilocaine  (EMLA ) cream Apply 1 Application topically as needed (Dialysis). 08/18/23  Yes [provider]  metoprolol  succinate (TOPROL -XL) 25 MG 24 hr tablet Take 0.5 tablets (12.5 mg total) by mouth daily. 07/24/23 01/22/24 Yes Sreenath, Sudheer B, MD  tamsulosin  (FLOMAX ) 0.4 MG CAPS capsule Take 1 capsule (0.4 mg total) by mouth daily. 08/12/19  Yes Stoioff, Glendia BROCKS, MD  enalapril  (VASOTEC ) 20 MG tablet Take 20 mg by mouth 2 (two) times daily. Patient not taking: Reported on 01/20/2024 08/12/21   [provider]  furosemide  (LASIX ) 40 MG tablet Take 1 tablet (40 mg total) by mouth every other day. Patient not taking: Reported on 01/20/2024 07/25/23   Jhonny Calvin NOVAK, MD  glipiZIDE  (GLUCOTROL  XL) 5 MG 24 hr tablet Take by mouth. Patient not taking: Reported on 01/20/2024 12/01/11   [provider]  LOKELMA 10 g PACK packet Take 1 packet by mouth daily. Patient not taking: Reported on 01/20/2024 07/12/23   [provider]  sodium bicarbonate  650 MG tablet Take 650 mg by mouth 2 (two) times daily. Patient not taking: Reported on 01/20/2024 06/16/23   [provider]  Scheduled Meds:  amiodarone   100 mg Oral Daily   atorvastatin   80 mg Oral Daily   Chlorhexidine  Gluconate Cloth  6 each Topical Daily   cholecalciferol   5,000 Units Oral Daily   diclofenac  Sodium  2 g Topical QID   docusate sodium   100 mg Oral QHS   donepezil   10 mg Oral QHS   epoetin  alfa-epbx (RETACRIT ) injection  10,000 Units Subcutaneous Once   famotidine   20 mg Oral Daily   finasteride   5 mg Oral Daily   insulin  aspart  0-15 Units Subcutaneous TID WC   insulin  aspart  0-5  Units Subcutaneous QHS   metoprolol  succinate  12.5 mg Oral Daily   midodrine   10 mg Oral TID WC   oseltamivir   30 mg Oral Q M,W,F-1800   tamsulosin   0.4 mg Oral Daily   Continuous Infusions:  ceFEPime  (MAXIPIME ) IV     Followed by   ceFEPime  (MAXIPIME ) IV     heparin  800 Units/hr (01/24/24 0200)   metronidazole  500 mg (01/24/24 0317)   norepinephrine  (LEVOPHED ) Adult infusion 7 mcg/min (01/24/24 0200)   sodium chloride      vancomycin  1,500 mg (01/24/24 0226)   vancomycin      PRN Meds:.acetaminophen  **OR** acetaminophen , ALPRAZolam , lidocaine  (PF), lidocaine -prilocaine , magnesium  hydroxide, morphine  injection, nitroGLYCERIN , ondansetron  **OR** ondansetron  (ZOFRAN ) IV, pentafluoroprop-tetrafluoroeth, traZODone      Critical care time: 55 minutes        Almarie Nose DNP, CCRN, FNP-C, AGACNP-BC Acute Care & Family Nurse Practitioner [facility] PCCM on call pager [phone]         [1] No Known Allergies  "

## 2024-01-24 NOTE — TOC Initial Note (Signed)
 Transition of Care [facility]) - Initial/Assessment Note    Patient Details  Name: Jeffery Joseph MRN: [id number] Date of Birth: 30-Dec-1940  [facility]) CM/SW Contact:    Jeffery JINNY Ruts, LCSW Phone Number: 01/24/2024, 12:12 PM  Clinical Narrative:                 Chart reviewed. The patient has a history of [place]ia. I was able able to speak with the patient POA and daughter, Jeffery Joseph. The patient PCP is Jeffery Joseph. The patient daughter reports that the patient lives in the home with his wife.   The patient daughter reports that the patient is total care and needs assistance. The patient daughter reports that she drives the patient to medical appointments. The patient daughter reports that the [facility]. Per bamboo search the patient was last active with [facility] in September.   The patient daughter reports that the patient has not been admitted into a SNF in the past. The patient daughter reports that the patient has a walker, hospital bed, and BSC. The patient daughter reports that the patient will need a wheel chair for a taller person at D/C.  The patient daughter has no concerns or questions during the call.         Patient Goals and CMS Choice            Expected Discharge Plan and Services                                              Prior Living Arrangements/Services                       Activities of Daily Living      Permission Sought/Granted                  Emotional Assessment              Admission diagnosis:  Chronic atrial fibrillation (HCC) [I48.20] Elevated troponin [R79.89] ESRD on hemodialysis (HCC) [N18.6, Z99.2] Chest pain [R07.9] Influenza [J11.1] Chest pain with moderate risk for cardiac etiology [R07.9] Patient Active Problem List   Diagnosis Date Noted   Chronic atrial fibrillation with RVR (HCC) 01/23/2024   Type 2 diabetes mellitus with chronic kidney disease,  without long-term current use of insulin  (HCC) 01/23/2024   BPH (benign prostatic hyperplasia) 01/23/2024   End-stage renal disease on hemodialysis (HCC) 01/23/2024   Influenza A 01/23/2024   Chest pain 01/22/2024   Elevated troponin 07/18/2023   Hypertensive emergency 07/18/2023   SOB (shortness of breath) 07/18/2023   Hypokalemia 07/18/2023   Acute pulmonary edema (HCC) 07/17/2023   Type 2 diabetes mellitus (HCC) 05/30/2023   End stage renal disease (HCC) 11/14/2021   Benign prostatic hyperplasia with lower urinary tract symptoms 08/12/2019   Benign essential hypertension 10/27/2018   PCP:  Joseph Jeffery SQUIBB, MD Pharmacy:   [facility] - [place], [place] - [address] [place] [address] Phone: [phone] Fax: [phone]  [facility] [address] [place] [facility] [address] Jeffery [address] [place] [address] Phone: [phone] Fax: [phone]     Social Drivers of Health (SDOH) Social History: SDOH Screenings   Food Insecurity: No Food Insecurity (12/01/2023)   Received from [facility]  Housing: Low Risk  (12/01/2023)   Received from [facility]  Transportation Needs: No Transportation Needs (12/01/2023)   Received from [facility]  Utilities: Not At Risk (12/01/2023)   Received from [facility]  Financial Resource Strain: Low Risk  (12/01/2023)   Received from [facility]  Social Connections: Socially Integrated (07/18/2023)  Tobacco Use: Low Risk (01/22/2024)  Recent Concern: Tobacco Use - Medium Risk (12/01/2023)   Received from [facility]   SDOH Interventions:     Readmission Risk Interventions    01/24/2024   12:12 PM 07/19/2023   11:05 AM  Readmission Risk Prevention Plan  Transportation Screening Complete Complete  PCP or Specialist Appt within 3-5 Days Complete Complete  HRI or Home Care  Consult Complete   Social Work Consult for Recovery Care Planning/Counseling Complete Complete  Palliative Care Screening Not Applicable Not Applicable  Medication Review Oceanographer) Complete Complete

## 2024-01-24 NOTE — Progress Notes (Addendum)
 Critical care note.  Date of note: 01/24/2024  Subjective: The patient has been more lethargic and hypotensive during the evening.  No fever or chills.  No reported nausea or vomiting or abdominal pain.  No reported worsening of dyspnea.  The patient was given 1 unit of packed red blood cells with improvement of his hemoglobin to 7.8 and hematocrit 24.  Objective: Physical examination: Generally: Acutely ill elderly African-American male who was fairly lethargic and difficult to arouse. Vital signs: BP 79/40 and earlier 85/40 with MAP of 53, heart rate 103 with respiratory rate of 20 and later 22 and pulse oximetry was 93 to 97% on room air.  Temperature was 99.8 earlier. Head - atraumatic, normocephalic.  Pupils - equal, round and reactive to light and accommodation. Extraocular movements are intact. No scleral icterus.  Oropharynx - moist mucous membranes and tongue. No pharyngeal erythema or exudate.  Neck - supple. No JVD. Carotid pulses 2+ bilaterally. No carotid bruits. No palpable thyromegaly or lymphadenopathy. Cardiovascular - regular rate and rhythm. Normal S1 and S2. No murmurs, gallops or rubs.  Lungs -diminished bibasilar breath sounds with bibasal crackles. Abdomen - soft and nontender. Positive bowel sounds. No palpable organomegaly or masses.  Extremities - no pitting edema, clubbing or cyanosis.  Neuro - grossly non-focal. Skin - no rashes. GU and rectal exam - deferred.  Labs and notes were reviewed.  Assessment/plan: 1.  Shock, hypovolemic versus septic. - The patient was started on IV Levophed  due to limitation of IV fluid hydration given his end-stage renal disease on hemodialysis as well as his lethargy and therefore was not able to be given p.o. midodrine . - The case was discussed with the ICU team and he will be transferred to [facility] -Stat portable chest x-ray was ordered and that showed increased lower lung/infrahilar predominant interstitial markings favoring mild  interstitial edema overall multifocal infection and pneumonia.  He is due for hemodialysis today. - He has been having chest pain with elevated troponin I and was initially started on IV heparin  but due to his significantly worsening anemia heparin  was held off.  Stool Hemoccult was still pending. - He has not had any chest pain today.  Authorized and performed by: Madison Peaches, MD Total critical care time:    35    minutes. Due to a high probability of clinically significant, life-threatening deterioration, the patient required my highest level of preparedness to intervene emergently and I personally spent this critical care time directly and personally managing the patient.  This critical care time included obtaining a history, examining the patient, pulse oximetry, ordering and review of studies, arranging urgent treatment with development of management plan, evaluation of patient's response to treatment, frequent reassessment, and discussions with other providers. This critical care time was performed to assess and manage the high probability of imminent, life-threatening deterioration that could result in multiorgan failure.  It was exclusive of separately billable procedures and treating other patients and teaching time.

## 2024-01-24 NOTE — Progress Notes (Signed)
 PHARMACY - PHYSICIAN COMMUNICATION CRITICAL VALUE ALERT - BLOOD CULTURE IDENTIFICATION (BCID)  BLANCA THORNTON is an 84 y.o. male who presented to [facility] on 01/22/2024 with a chief complaint of shortness of breath  Assessment:  blood culture from 1/12 with GPC in 1 of 4 bottles currently,  BCID is being sent to [facility] as [facility] biofire device is down.   Name of physician (or Provider) Contacted: Dr Isaiah  Current antibiotics: doxycyline, ceftriaxone , tamiflu . He received vancomycin  1500mg  dose this am at 0200 (getting HD today)   Changes to prescribed antibiotics recommended:  SInce patient receive vancomycin  this morning, anticipate therapeutic levels even after HD today.  Await BCID result from [facility]  No results found for this or any previous visit.  Junious Ragone, PharmD, BCPS, BCIDP Work Cell: [phone] 01/24/2024 1:06 PM

## 2024-01-25 ENCOUNTER — Inpatient Hospital Stay: Admit: 2024-01-25

## 2024-01-25 ENCOUNTER — Inpatient Hospital Stay: Admit: 2024-01-25 | Discharge: 2024-01-25 | Disposition: A | Attending: Critical Care Medicine

## 2024-01-25 ENCOUNTER — Inpatient Hospital Stay

## 2024-01-25 DIAGNOSIS — E871 Hypo-osmolality and hyponatremia: Secondary | ICD-10-CM

## 2024-01-25 DIAGNOSIS — N186 End stage renal disease: Secondary | ICD-10-CM | POA: Diagnosis not present

## 2024-01-25 DIAGNOSIS — D631 Anemia in chronic kidney disease: Secondary | ICD-10-CM

## 2024-01-25 DIAGNOSIS — Z794 Long term (current) use of insulin: Secondary | ICD-10-CM | POA: Diagnosis not present

## 2024-01-25 DIAGNOSIS — R6521 Severe sepsis with septic shock: Secondary | ICD-10-CM | POA: Diagnosis not present

## 2024-01-25 DIAGNOSIS — A419 Sepsis, unspecified organism: Secondary | ICD-10-CM | POA: Diagnosis not present

## 2024-01-25 DIAGNOSIS — I5033 Acute on chronic diastolic (congestive) heart failure: Secondary | ICD-10-CM | POA: Diagnosis not present

## 2024-01-25 DIAGNOSIS — R571 Hypovolemic shock: Secondary | ICD-10-CM | POA: Diagnosis not present

## 2024-01-25 DIAGNOSIS — Z992 Dependence on renal dialysis: Secondary | ICD-10-CM | POA: Diagnosis not present

## 2024-01-25 DIAGNOSIS — R7881 Bacteremia: Secondary | ICD-10-CM

## 2024-01-25 DIAGNOSIS — J111 Influenza due to unidentified influenza virus with other respiratory manifestations: Secondary | ICD-10-CM | POA: Diagnosis not present

## 2024-01-25 DIAGNOSIS — E1122 Type 2 diabetes mellitus with diabetic chronic kidney disease: Secondary | ICD-10-CM | POA: Diagnosis not present

## 2024-01-25 DIAGNOSIS — D733 Abscess of spleen: Secondary | ICD-10-CM | POA: Diagnosis not present

## 2024-01-25 DIAGNOSIS — L8990 Pressure ulcer of unspecified site, unspecified stage: Secondary | ICD-10-CM | POA: Insufficient documentation

## 2024-01-25 DIAGNOSIS — I482 Chronic atrial fibrillation, unspecified: Secondary | ICD-10-CM | POA: Diagnosis not present

## 2024-01-25 DIAGNOSIS — E876 Hypokalemia: Secondary | ICD-10-CM

## 2024-01-25 DIAGNOSIS — B952 Enterococcus as the cause of diseases classified elsewhere: Secondary | ICD-10-CM

## 2024-01-25 DIAGNOSIS — I132 Hypertensive heart and chronic kidney disease with heart failure and with stage 5 chronic kidney disease, or end stage renal disease: Secondary | ICD-10-CM

## 2024-01-25 LAB — ECHOCARDIOGRAM COMPLETE
AR max vel: 1.27 cm2
AV Area VTI: 1.51 cm2
AV Area mean vel: 1.28 cm2
AV Mean grad: 16.3 mmHg
AV Peak grad: 30.7 mmHg
Ao pk vel: 2.77 m/s
Area-P 1/2: 5.31 cm2
Calc EF: 38.5 %
Height: 77 in
MV M vel: 5.21 m/s
MV Peak grad: 108.6 mmHg
MV VTI: 2.15 cm2
P 1/2 time: 206 ms
Radius: 0.8 cm
S' Lateral: 3.9 cm
Single Plane A2C EF: 43.2 %
Single Plane A4C EF: 35.4 %
Weight: 3506.2 [oz_av]

## 2024-01-25 LAB — COMPREHENSIVE METABOLIC PANEL WITH GFR
ALT: 33 U/L (ref 0–44)
AST: 22 U/L (ref 15–41)
Albumin: 2.6 g/dL — ABNORMAL LOW (ref 3.5–5.0)
Alkaline Phosphatase: 64 U/L (ref 38–126)
Anion gap: 10 (ref 5–15)
BUN: 35 mg/dL — ABNORMAL HIGH (ref 8–23)
CO2: 27 mmol/L (ref 22–32)
Calcium: 8.3 mg/dL — ABNORMAL LOW (ref 8.9–10.3)
Chloride: 93 mmol/L — ABNORMAL LOW (ref 98–111)
Creatinine, Ser: 3.62 mg/dL — ABNORMAL HIGH (ref 0.61–1.24)
GFR, Estimated: 16 mL/min — ABNORMAL LOW
Glucose, Bld: 147 mg/dL — ABNORMAL HIGH (ref 70–99)
Potassium: 3.3 mmol/L — ABNORMAL LOW (ref 3.5–5.1)
Sodium: 130 mmol/L — ABNORMAL LOW (ref 135–145)
Total Bilirubin: 0.7 mg/dL (ref 0.0–1.2)
Total Protein: 6.5 g/dL (ref 6.5–8.1)

## 2024-01-25 LAB — CBC WITH DIFFERENTIAL/PLATELET
Abs Immature Granulocytes: 0.08 K/uL — ABNORMAL HIGH (ref 0.00–0.07)
Basophils Absolute: 0.1 K/uL (ref 0.0–0.1)
Basophils Relative: 1 %
Eosinophils Absolute: 0.1 K/uL (ref 0.0–0.5)
Eosinophils Relative: 1 %
HCT: 28.6 % — ABNORMAL LOW (ref 39.0–52.0)
Hemoglobin: 9.1 g/dL — ABNORMAL LOW (ref 13.0–17.0)
Immature Granulocytes: 1 %
Lymphocytes Relative: 12 %
Lymphs Abs: 1.4 K/uL (ref 0.7–4.0)
MCH: 24.6 pg — ABNORMAL LOW (ref 26.0–34.0)
MCHC: 31.8 g/dL (ref 30.0–36.0)
MCV: 77.3 fL — ABNORMAL LOW (ref 80.0–100.0)
Monocytes Absolute: 0.9 K/uL (ref 0.1–1.0)
Monocytes Relative: 8 %
Neutro Abs: 8.8 K/uL — ABNORMAL HIGH (ref 1.7–7.7)
Neutrophils Relative %: 77 %
Platelets: 202 K/uL (ref 150–400)
RBC: 3.7 MIL/uL — ABNORMAL LOW (ref 4.22–5.81)
RDW: 18.6 % — ABNORMAL HIGH (ref 11.5–15.5)
WBC: 11.3 K/uL — ABNORMAL HIGH (ref 4.0–10.5)
nRBC: 0.2 % (ref 0.0–0.2)

## 2024-01-25 LAB — HEPATITIS B SURFACE ANTIBODY, QUANTITATIVE: Hep B S AB Quant (Post): <3.5 m[IU]/mL — ABNORMAL LOW

## 2024-01-25 LAB — GLUCOSE, CAPILLARY
Glucose-Capillary: 137 mg/dL — ABNORMAL HIGH (ref 70–99)
Glucose-Capillary: 136 mg/dL — ABNORMAL HIGH (ref 70–99)
Glucose-Capillary: 87 mg/dL (ref 70–99)
Glucose-Capillary: 129 mg/dL — ABNORMAL HIGH (ref 70–99)

## 2024-01-25 LAB — CBC
HCT: 28.4 % — ABNORMAL LOW (ref 39.0–52.0)
Hemoglobin: 8.7 g/dL — ABNORMAL LOW (ref 13.0–17.0)
MCH: 24.2 pg — ABNORMAL LOW (ref 26.0–34.0)
MCHC: 30.6 g/dL (ref 30.0–36.0)
MCV: 79.1 fL — ABNORMAL LOW (ref 80.0–100.0)
Platelets: 185 K/uL (ref 150–400)
RBC: 3.59 MIL/uL — ABNORMAL LOW (ref 4.22–5.81)
RDW: 18.6 % — ABNORMAL HIGH (ref 11.5–15.5)
WBC: 9.5 K/uL (ref 4.0–10.5)
nRBC: 0 % (ref 0.0–0.2)

## 2024-01-25 LAB — MAGNESIUM: Magnesium: 1.9 mg/dL (ref 1.7–2.4)

## 2024-01-25 LAB — APTT
aPTT: 71 s — ABNORMAL HIGH (ref 24–36)
aPTT: 79 s — ABNORMAL HIGH (ref 24–36)

## 2024-01-25 LAB — TROPONIN T: Troponin T (Highly Sensitive): 788 ng/L — ABNORMAL HIGH (ref 0–22)

## 2024-01-25 LAB — HEPARIN LEVEL (UNFRACTIONATED): Heparin Unfractionated: >1.1 [IU]/mL — ABNORMAL HIGH (ref 0.30–0.70)

## 2024-01-25 LAB — LACTIC ACID, PLASMA
Lactic Acid, Venous: 1.5 mmol/L (ref 0.5–1.9)
Lactic Acid, Venous: 2.1 mmol/L (ref 0.5–1.9)

## 2024-01-25 LAB — PHOSPHORUS: Phosphorus: 2.7 mg/dL (ref 2.5–4.6)

## 2024-01-25 MED ORDER — SODIUM CHLORIDE 0.9 % IV SOLN
2.0000 g | INTRAVENOUS | Status: DC
  Administered 2024-01-25: 2 g via INTRAVENOUS
  Filled 2024-01-25: qty 2000

## 2024-01-25 MED ORDER — SODIUM CHLORIDE 0.9 % IV SOLN
2.0000 g | Freq: Two times a day (BID) | INTRAVENOUS | Status: DC
  Administered 2024-01-25 – 2024-02-03 (×19): 2 g via INTRAVENOUS
  Filled 2024-01-25 (×21): qty 2000

## 2024-01-25 MED ORDER — SODIUM CHLORIDE 0.9 % IV SOLN: 250.0000 mL | INTRAVENOUS | Status: AC

## 2024-01-25 MED ORDER — NOREPINEPHRINE 4 MG/250ML-% IV SOLN
0.0000 ug/min | INTRAVENOUS | Status: DC
  Administered 2024-01-25: 8 ug/min via INTRAVENOUS
  Administered 2024-01-26 (×2): 10 ug/min via INTRAVENOUS
  Administered 2024-01-27 (×2): 9 ug/min via INTRAVENOUS
  Administered 2024-01-27: 10 ug/min via INTRAVENOUS
  Filled 2024-01-25 (×7): qty 250

## 2024-01-25 MED ORDER — POTASSIUM CHLORIDE CRYS ER 20 MEQ PO TBCR
20.0000 meq | EXTENDED_RELEASE_TABLET | Freq: Once | ORAL | Status: AC
  Administered 2024-01-25: 20 meq via ORAL
  Filled 2024-01-25: qty 1

## 2024-01-25 MED ORDER — IOHEXOL 350 MG/ML SOLN
100.0000 mL | Freq: Once | INTRAVENOUS | Status: AC | PRN
  Administered 2024-01-25: 100 mL via INTRAVENOUS

## 2024-01-25 NOTE — Consult Note (Signed)
 [facility] SURGICAL CONSULTATION NOTE (initial) - cpt: 00746   HISTORY OF PRESENT ILLNESS (HPI):  84 y.o. male presented to [facility] ED initially on 01/22/2024 secondary to shortness of breath. At the time of presentation, patient had been recently diagnosed with influenza and was started on Tamiflu . Unfortunately, he had become progressively more SOB and weaker which prompted his presentation. He was admitted to medicine service for functional decline/failure to thrive as well as CP and atrial fibrillation with RVR. 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 Overnight on 01/11, patient become increasingly lethargic and hypotensive requiring vasopressor support. Was transferred to [facility] service. Cardiology is following as well. He had been complaining of a feeling of food not passing but this afternoon he adamantly denied any abdominal pain, nausea, emesis, or bowel changes. Just feels that his stomach does not feel good. He did undergo CT Abdomen/Pelvis today which was concerning for fluid collection on the spleen. He was also noted to have dilation of his appendix with appendicolith although this is stable compared to August. He did have positive blood Cx for enterococcus. Most recent labs show a WBC to 11.3K, Hgb to 9.1, venous lactate 2.1. He is on Ampicillin . Currently on 8 mcg Norepinephrine   Surgery is consulted by PCCM physician Dr. Nickolas Cellar, MD in this context for evaluation and management of possible splenic abscess.  PAST MEDICAL HISTORY (PMH):  Past Medical History:  Diagnosis Date   Anxiety    Arthritis    Depression    Diabetes mellitus without complication (HCC)    End stage renal disease (HCC)    Hypertension    Pneumonia    Renal disorder      PAST SURGICAL HISTORY (PSH):  Past Surgical History:  Procedure Laterality Date   A/V FISTULAGRAM Left 06/08/2023    Procedure: A/V Fistulagram;  Surgeon: Jama Cordella MATSU, MD;  Location: [facility] INVASIVE CV LAB;  Service: Cardiovascular;  Laterality: Left;   A/V SHUNT INTERVENTION Left 09/29/2023   Procedure: A/V SHUNT INTERVENTION;  Surgeon: Marea Selinda RAMAN, MD;  Location: [facility] INVASIVE CV LAB;  Service: Cardiovascular;  Laterality: Left;   APPLICATION OF WOUND VAC Right 11/14/2021   Procedure: APPLICATION OF WOUND VAC;  Surgeon: Jama Cordella MATSU, MD;  Location: [facility] ORS;  Service: Vascular;  Laterality: Right;   AV FISTULA PLACEMENT Left 11/14/2021   Procedure: ARTERIOVENOUS (AV) FISTULA CREATION ( BRACHIAL CEPHALIC);  Surgeon: Jama Cordella MATSU, MD;  Location: [facility] ORS;  Service: Vascular;  Laterality: Left;     MEDICATIONS:  Prior to Admission medications  Medication Sig Start Date End Date Taking? Authorizing Provider  acetaminophen  (TYLENOL ) 325 MG tablet Take 650 mg by mouth every 6 (six) hours as needed.   Yes [provider]  ALPRAZolam  (XANAX ) 0.5 MG tablet Take 0.5 mg by mouth 3 (three) times daily as needed for sleep. 02/05/21  Yes [provider]  amiodarone  (PACERONE ) 200 MG tablet Take 2 tablets (400 mg total) by mouth 2 (two) times daily for 7 days, THEN 1 tablet (200 mg total) daily. 07/24/23 01/22/24 Yes Sreenath, Sudheer B, MD  apixaban  (ELIQUIS ) 2.5 MG TABS tablet Take 1 tablet (2.5 mg total) by mouth 2 (two) times daily. 07/24/23 01/23/24 Yes Sreenath, Sudheer B, MD  Cholecalciferol  125 MCG (5000 UT) TABS Take 5,000 Units by mouth daily.   Yes [provider]  clopidogrel  (PLAVIX ) 75 MG tablet Take 1 tablet (75 mg  total) by mouth daily. 06/09/23  Yes Schnier, Cordella MATSU, MD  diclofenac  Sodium (VOLTAREN ) 1 % GEL Apply 2 g topically 4 (four) times daily. 07/24/23  Yes Sreenath, Sudheer B, MD  Docusate Sodium  (DSS) 100 MG CAPS Take 100 mg by mouth at bedtime.   Yes [provider]  donepezil  (ARICEPT ) 10 MG tablet Take 10 mg by mouth at bedtime. 05/14/21  Yes [provider]  finasteride  (PROSCAR ) 5 MG tablet Take 1 tablet (5 mg total) by mouth daily. 08/12/19  Yes Stoioff, Glendia BROCKS, MD  lidocaine -prilocaine  (EMLA ) cream Apply 1 Application topically as needed (Dialysis). 08/18/23  Yes [provider]  metoprolol  succinate (TOPROL -XL) 25 MG 24 hr tablet Take 0.5 tablets (12.5 mg total) by mouth daily. 07/24/23 01/22/24 Yes Sreenath, Sudheer B, MD  tamsulosin  (FLOMAX ) 0.4 MG CAPS capsule Take 1 capsule (0.4 mg total) by mouth daily. 08/12/19  Yes Stoioff, Glendia BROCKS, MD  enalapril  (VASOTEC ) 20 MG tablet Take 20 mg by mouth 2 (two) times daily. Patient not taking: Reported on 01/20/2024 08/12/21   [provider]  furosemide  (LASIX ) 40 MG tablet Take 1 tablet (40 mg total) by mouth every other day. Patient not taking: Reported on 01/20/2024 07/25/23   Jhonny Calvin NOVAK, MD  glipiZIDE  (GLUCOTROL  XL) 5 MG 24 hr tablet Take by mouth. Patient not taking: Reported on 01/20/2024 12/01/11   [provider]  LOKELMA 10 g PACK packet Take 1 packet by mouth daily. Patient not taking: Reported on 01/20/2024 07/12/23   [provider]  sodium bicarbonate  650 MG tablet Take 650 mg by mouth 2 (two) times daily. Patient not taking: Reported on 01/20/2024 06/16/23   [provider]     ALLERGIES:  Allergies[1]   SOCIAL HISTORY:  Social History   Socioeconomic History   Marital status: Married    Spouse name: ,Loraine   Number of children: Not on file   Years of education: Not on file   Highest education level: Not on file  Occupational History   Not on file  Tobacco Use   Smoking status: Never   Smokeless tobacco: Never  Vaping Use   Vaping status: Never Used  Substance and Sexual Activity   Alcohol use: No   Drug use: Not Currently   Sexual activity: Not Currently  Other Topics Concern   Not on file  Social History Narrative   Not on file   Social Drivers of Health   Tobacco Use: Low Risk (01/22/2024)   Patient History     Smoking Tobacco Use: Never    Smokeless Tobacco Use: Never    Passive Exposure: Not on file  Recent Concern: Tobacco Use - Medium Risk (12/01/2023)   Received from [facility]   Patient History    Passive Exposure: Not on file    Smokeless Tobacco Use: Never    Smoking Tobacco Use: Former  Physicist, Medical Strain: Low Risk  (12/01/2023)   Received from [facility]   Overall Financial Resource Strain (CARDIA)    Difficulty of Paying Living Expenses: Not hard at all  Food Insecurity: Patient Unable To Answer (01/24/2024)   Epic    Worried About Programme Researcher, Broadcasting/film/video in the Last Year: Patient unable to answer    Ran Out of Food in the Last Year: Patient unable to answer  Transportation Needs: Patient Unable To Answer (01/24/2024)   Epic    Lack of Transportation (Medical): Patient unable to answer  Lack of Transportation (Non-Medical): Patient unable to answer  Physical Activity: Not on file  Stress: Not on file  Social Connections: Unknown (01/24/2024)   Social Connection and Isolation Panel    Frequency of Communication with Friends and Family: Patient unable to answer    Frequency of Social Gatherings with Friends and Family: Patient unable to answer    Attends Religious Services: Patient unable to answer    Active Member of Clubs or Organizations: Not on file    Attends Banker Meetings: Patient unable to answer    Marital Status: Patient unable to answer  Intimate Partner Violence: Patient Unable To Answer (01/24/2024)   Epic    Fear of Current or Ex-Partner: Patient unable to answer    Emotionally Abused: Patient unable to answer    Physically Abused: Patient unable to answer    Sexually Abused: Patient unable to answer  Depression (EYV7-0): Not on file  Alcohol Screen: Not on file  Housing: Unknown (01/24/2024)   Epic    Unable to Pay for Housing in the Last Year: Patient unable to answer    Number of Times Moved in the Last  Year: 0    Homeless in the Last Year: Patient unable to answer  Utilities: Patient Unable To Answer (01/24/2024)   Epic    Threatened with loss of utilities: Patient unable to answer  Health Literacy: Not on file     FAMILY HISTORY:  History reviewed. No pertinent family history.    REVIEW OF SYSTEMS:  Review of Systems  Constitutional:  Negative for chills and fever.  Respiratory:  Negative for cough and shortness of breath.   Gastrointestinal:  Negative for abdominal pain, nausea and vomiting.  Genitourinary:  Negative for dysuria and urgency.  All other systems reviewed and are negative.   VITAL SIGNS:  Temp:  [97.4 F (36.3 C)-98.3 F (36.8 C)] 98.3 F (36.8 C) (01/13 1200) Pulse Rate:  [41-124] 113 (01/13 1215) Resp:  [0-37] 20 (01/13 1215) BP: (76-131)/(35-85) 111/47 (01/13 1215) SpO2:  [90 %-100 %] 99 % (01/13 1215) Weight:  [99.4 kg] 99.4 kg (01/13 0630)     Height: 6' 5 (195.6 cm) Weight: 99.4 kg BMI (Calculated): 25.98   INTAKE/OUTPUT:  01/12 0701 - 01/13 0700 In: 2487.8 [P.O.:960; I.V.:827.8; IV Piggyback:700] Out: 2150 [Urine:650]  PHYSICAL EXAM:  Physical Exam Vitals and nursing note reviewed.  Constitutional:      General: He is not in acute distress.    Appearance: Normal appearance. He is normal weight. He is not ill-appearing.     Comments: Resting in bed; NAD Wife at bedside   HENT:     Head: Normocephalic and atraumatic.  Eyes:     General: No scleral icterus.    Conjunctiva/sclera: Conjunctivae normal.  Cardiovascular:     Rate and Rhythm: Regular rhythm. Tachycardia present.  Pulmonary:     Effort: Pulmonary effort is normal. No respiratory distress.  Abdominal:     General: Abdomen is flat. There is no distension.     Palpations: Abdomen is soft.     Tenderness: There is no abdominal tenderness. There is no guarding or rebound.     Comments: Abdomen is soft, non-distended, no rebound/guarding   Genitourinary:    Comments:  Deferred Skin:    General: Skin is warm and dry.  Neurological:     General: No focal deficit present.     Mental Status: He is alert. Mental status is at baseline.  Psychiatric:  Mood and Affect: Mood normal.        Behavior: Behavior normal.      Labs:     Latest Ref Rng & Units 01/25/2024    1:09 PM 01/25/2024    3:30 AM 01/24/2024   10:05 AM  CBC  WBC 4.0 - 10.5 K/uL 11.3  9.5    Hemoglobin 13.0 - 17.0 g/dL 9.1  8.7  9.1   Hematocrit 39.0 - 52.0 % 28.6  28.4  27.7   Platelets 150 - 400 K/uL 202  185        Latest Ref Rng & Units 01/25/2024    3:30 AM 01/24/2024    3:54 PM 01/24/2024    3:17 AM  CMP  Glucose 70 - 99 mg/dL 852  826  879   BUN 8 - 23 mg/dL 35  31  52   Creatinine 0.61 - 1.24 mg/dL 6.37  6.62  4.94   Sodium 135 - 145 mmol/L 130  132  135   Potassium 3.5 - 5.1 mmol/L 3.3  3.6  3.7   Chloride 98 - 111 mmol/L 93  94  97   CO2 22 - 32 mmol/L 27  27  25    Calcium  8.9 - 10.3 mg/dL 8.3  8.3  8.5   Total Protein 6.5 - 8.1 g/dL 6.5   6.7   Total Bilirubin 0.0 - 1.2 mg/dL 0.7   0.9   Alkaline Phos 38 - 126 U/L 64   66   AST 15 - 41 U/L 22   34   ALT 0 - 44 U/L 33   48      Imaging studies:   CT Abdomen/Pelvis (01/25/2024) personally reviewed with fluid collection of the spleen, possible abscess, dilated appendix noted with appendicolith distally but this is stable since August and without inflammatory changes, and radiologist report reviewed below:  IMPRESSION: 1. Abnormal hypodense lesions in the spleen measuring up to 10.5 x 4.5 x 4.0 cm (volume = 99 cm\S\3), not present on prior exam, raising concern for splenic abscesses or hematomas. 2. Stable dilated appendix at 9 mm diameter with internal high density suspicious for appendicolith. Given the chronic prominence, an underlying appendiceal mass cannot be readily excluded. 3. Hazy airspace opacity medially in the left lower lobe, nonspecific, with early bronchopneumonia not excluded. 4. 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 5. New nonspecific presacral edema. 6. 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 7. Grade 1 degenerative anterolisthesis at L4-5 with lower lumbar spondylosis and degenerative disc disease and suspected bilateral foraminal stenosis at L3-4 and L4-5 with borderline bilateral foraminal stenosis at L5-S1. 8. Coronary and aortic atherosclerosis with moderate cardiomegaly. 9. Trace bilateral pleural effusions with mild atelectasis in the lung bases. 10. Stable prostatomegaly.   Assessment/Plan:  84 y.o. 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   - Case reviewed with IR regarding possible splenic abscess; They will plan for aspiration +/- drain placement tomorrow (1/14). Appreciate their assistance - His appendix dilation is stable since August and there is no surrounding inflammatory changes to suggest appendicitis. His abdomen is benign at this time. I do not think he warrants any surgical intervention nor is he an optimal candidate in this current state   - He will need to be NPO after midnight anticipating  IR procedure tomorrow - Continue IV Abx for now    - Monitor abdominal examination  - Monitor leukocytosis; lactic acidosis  - Further management per consulting and primary services; we can follow along for now    - DVT prophylaxis  All of the above findings and recommendations were discussed with the patient and his family (wife, daughter), and all of their questions were answered to their expressed satisfaction.  Thank you for the opportunity to participate in this patient's care.   -- Arthea Platt, PA-C  [facility] 01/25/2024, 4:16  PM M-F: 7am - 4pm     [1] No Known Allergies

## 2024-01-25 NOTE — Plan of Care (Addendum)
 Patient has been A&O X's 4 this shift. Denies pain. Levophed  continues to be titrated to maintain MAP >60 or SBP >100, currently infusing at 5 mcg/min. Heparin  infusing per order at 1150 units/hour. Antibiotics given this shift per order. Patient has had 250 ml urine out via external catheter. Pt also had a large brown BM this morning. NP notified of potassium 3.3 this morning, Klor-Con  M 20 mEq ordered and given. High Fall Risk.  Problem: Coping: Goal: Ability to adjust to condition or change in health will improve Outcome: Progressing   Problem: Fluid Volume: Goal: Ability to maintain a balanced intake and output will improve Outcome: Progressing   Problem: Metabolic: Goal: Ability to maintain appropriate glucose levels will improve Outcome: Progressing   Problem: Nutritional: Goal: Maintenance of adequate nutrition will improve Outcome: Progressing   Problem: Skin Integrity: Goal: Risk for impaired skin integrity will decrease Outcome: Progressing   Problem: Tissue Perfusion: Goal: Adequacy of tissue perfusion will improve Outcome: Progressing   Problem: Clinical Measurements: Goal: Ability to maintain clinical measurements within normal limits will improve Outcome: Progressing Goal: Will remain free from infection Outcome: Progressing Goal: Diagnostic test results will improve Outcome: Progressing Goal: Respiratory complications will improve Outcome: Progressing Goal: Cardiovascular complication will be avoided Outcome: Progressing   Problem: Pain Managment: Goal: General experience of comfort will improve and/or be controlled Outcome: Progressing   Problem: Safety: Goal: Ability to remain free from injury will improve Outcome: Progressing   Problem: Activity: Goal: Risk for activity intolerance will decrease Outcome: Not Progressing

## 2024-01-25 NOTE — Progress Notes (Signed)
*  PRELIMINARY RESULTS* Echocardiogram 2D Echocardiogram has been performed.  Floydene Harder 01/25/2024, 1:43 PM

## 2024-01-25 NOTE — Progress Notes (Signed)
 Patients daughter called, requesting an update. She also wanted to check to see if patient had Lipitor  ordered and if so, she requested that it be discontinued. She said that it makes the patient weak, lethargic and unable to move his legs. Reassured patients daughter that I would speak with the NP to see if it could be discontinued. Discussed with Jenita Ruth Rust-Chester, NP and received a verbal order to discontinue Lipitor .

## 2024-01-25 NOTE — Progress Notes (Signed)
 PHARMACY - PHYSICIAN COMMUNICATION CRITICAL VALUE ALERT - BLOOD CULTURE IDENTIFICATION (BCID)  Jeffery Joseph is an 84 y.o. male who presented to [facility] on 01/22/2024 with a chief complaint of SOB and abdominal pain.  Assessment:  4/4 bottles growing enterococcus faecalis (no resistance)   Name of physician (or Provider) Contacted: Jenita Meek, NP   Current antibiotics:  Ceftriaxone  2 g IV every 24 hours Doxycycline  100 mg IV every 12 hours  Changes to prescribed antibiotics recommended:  Stop ceftriaxone  and start ampicillin  2 g IV every 24 hours  Results for orders placed or performed during the hospital encounter of 01/22/24  Blood Culture ID Panel (Reflexed) (Collected: 01/24/2024 12:14 AM)  Result Value Ref Range   Enterococcus faecalis DETECTED (A) NOT DETECTED   Enterococcus Faecium NOT DETECTED NOT DETECTED   Listeria monocytogenes NOT DETECTED NOT DETECTED   Staphylococcus species NOT DETECTED NOT DETECTED   Staphylococcus aureus (BCID) NOT DETECTED NOT DETECTED   Staphylococcus epidermidis NOT DETECTED NOT DETECTED   Staphylococcus lugdunensis NOT DETECTED NOT DETECTED   Streptococcus species NOT DETECTED NOT DETECTED   Streptococcus agalactiae NOT DETECTED NOT DETECTED   Streptococcus pneumoniae NOT DETECTED NOT DETECTED   Streptococcus pyogenes NOT DETECTED NOT DETECTED   A.calcoaceticus-baumannii NOT DETECTED NOT DETECTED   Bacteroides fragilis NOT DETECTED NOT DETECTED   Enterobacterales NOT DETECTED NOT DETECTED   Enterobacter cloacae complex NOT DETECTED NOT DETECTED   Escherichia coli NOT DETECTED NOT DETECTED   Klebsiella aerogenes NOT DETECTED NOT DETECTED   Klebsiella oxytoca NOT DETECTED NOT DETECTED   Klebsiella pneumoniae NOT DETECTED NOT DETECTED   Proteus species NOT DETECTED NOT DETECTED   Salmonella species NOT DETECTED NOT DETECTED   Serratia marcescens NOT DETECTED NOT DETECTED   Haemophilus influenzae NOT DETECTED NOT DETECTED    Neisseria meningitidis NOT DETECTED NOT DETECTED   Pseudomonas aeruginosa NOT DETECTED NOT DETECTED   Stenotrophomonas maltophilia NOT DETECTED NOT DETECTED   Candida albicans NOT DETECTED NOT DETECTED   Candida auris NOT DETECTED NOT DETECTED   Candida glabrata NOT DETECTED NOT DETECTED   Candida krusei NOT DETECTED NOT DETECTED   Candida parapsilosis NOT DETECTED NOT DETECTED   Candida tropicalis NOT DETECTED NOT DETECTED   Cryptococcus neoformans/gattii NOT DETECTED NOT DETECTED   Vancomycin  resistance NOT DETECTED NOT DETECTED    Thank you for involving pharmacy in this patient's care.   Damien Napoleon, PharmD Clinical Pharmacist 01/25/2024 12:51 AM

## 2024-01-25 NOTE — Progress Notes (Addendum)
 "  NAME:  Jeffery Joseph, MRN:  [id number], DOB:  [birth year], LOS: 2 ADMISSION DATE:  01/22/2024, CONSULTATION DATE: 01/24/2024 REFERRING MD: Dr. 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 Symptoms began several days prior to presentation. Patient was taking oseltamivir  for possible influenza. He reported dry cough and denied nausea, vomiting, abdominal pain, diarrhea, melena, or hematochezia. Last hemodialysis session was on Friday prior to admission.  ED Course: On arrival, vital signs: BP 106/49 mmHg, HR 111 bpm, RR 20, T 74F, SpO? 100% on room air. Initial labs showed WBC 11.8 K/L, Hgb 8.0 g/dL, platelets 790 K/L, sodium 134 mmol/L, potassium 3.5 mmol/L, BUN 46 mg/dL, creatinine 4.7 mg/dL, lactate 2.2 mmol/L, AST 73 U/L, ALT 70 U/L. Chest X-ray demonstrated cardiomegaly without acute consolidation. CT chest without contrast showed cardiomegaly and no acute airspace disease. Patient received 500 mL IV normal saline bolus, famotidine , and antacids and was admitted to TRH service for observation.  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), T2DM, HTN, PAD, chronic atrial fibrillation on anticoagulation, chronic anemia, anxiety/depression, and BPH   Micro Data  COVID/Influenza A&B/RSV 01/11>>negative  RVP 01/11>>negative  Blood x2 01/11>>enterobacter  MRSA PCR 01/12>>negative  Tracheal aspirate 01/13>>  Significant Hospital Events: Including procedures, antibiotic start and stop dates in addition to other pertinent events   1/11: Admitted to trh service 1/12: Became hypotensive started on  levophed , critical care consulted 1/13: Pt continues to require levophed  gtt complains of his stomach not feeling good CT             Abd Pelvis pending.  MRI Brain pending    Interim History / Subjective:  As outlined above under significant events   Objective    Blood pressure (!) 112/56, pulse (!) 124, temperature 97.9 F (36.6 C), temperature source Oral, resp. rate (!) 21, height 6' 5 (1.956 m), weight 99.4 kg, SpO2 91%.        Intake/Output Summary (Last 24 hours) at 01/25/2024 0919 Last data filed at 01/25/2024 0700 Gross per 24 hour  Intake 2417.14 ml  Output 2150 ml  Net 267.14 ml   Filed Weights   01/22/24 1740 01/24/24 0122 01/25/24 0630  Weight: 63.5 kg 96.9 kg 99.4 kg    Examination: General: Acute on chronically-ill appearing male, NAD on RA  HENT: Supple, no JVD  Lungs: Diminished throughout, even, non labored  Cardiovascular: Sinus tachycardia, no m/r/g, 2+ radial/1+, trace generalized edema Abdomen: Hypoactive BS x4, obese, taut, non tender  Extremities: Normal bulk and tone, moves all extremities  Neuro: Alert and oriented x4, follows commands, PERRLA GU: Deferred  Resolved problem list   Assessment and Plan   #Dementia #Depression  - Continue outpatient donepezil  and prn xanax   - MRI Brain pending  - Avoid sedation medication as able  - Frequent reorientation and promote family presence at bedside   #Septic and hypovolemic shock  #Elevated troponin due to demand ischemia  #Chronic atrial flutter/fibrillation   #Acute on chronic HFpEF  Hx: HTN and PAD  - Continuous telemetry monitoring - Prn levophed  gtt to maintain map >60 - Troponin peaked at 789 - Lactic acid pending  -  Continue scheduled midodrine   - Continue scheduled amiodarone  and finasteride  - Continue heparin  gtt: dosing per pharmacy of CT Abd Pelvis negative will resume outpatient apixaban   - Cardiology consulted appreciate input   #Pulmonary edema  - Supplemental O2 for  dyspnea and/or hypoxia - Maintain O2 sats 92% or higher  - Prn CXR's  - Fluid removal as tolerated during HD   #ESRD on HD  #Hypokalemia  #Hyponatremia  #Hypomagnesia  - Trend BMP  - Replace electrolytes as indicated  - Strict I&O's - Nephrology consulted appreciate input: HD per recommendations   #Incidental finding of splenic hypodensity  #Enterococcus bacteremia  #Possible intra-abdominal infection #Previous influenza infection  - Trend WBC and monitor fever curve - Follow cultures  - Continue ampicillin  pending culture results and sensitivities  - Continue tamiflu   - CT Abd Pelvis pending   #Anemia of chronic kidney disease  - Trend CBC  - Monitor for s/sx of bleeding  - Transfuse for hgb <7  Type II diabetes mellitus  - CBG's ac/hs - SSI  - Target CBG readings 140 to 180 - Follow hypo/hyperglycemic protocol   Labs   CBC: Recent Labs  Lab 01/22/24 1745 01/23/24 0430 01/23/24 0826 01/23/24 2200 01/24/24 0317 01/24/24 1005 01/25/24 0330  WBC 11.8* 5.8  --   --  8.9  --  9.5  HGB 8.0* 4.5* 7.6* 7.8* 8.2* 9.1* 8.7*  HCT 25.1* 15.3*  --  24.0* 25.0* 27.7* 28.4*  MCV 76.5* 82.3  --   --  76.7*  --  79.1*  PLT 209 117*  --   --  202  --  185    Basic Metabolic Panel: Recent Labs  Lab 01/22/24 1745 01/23/24 0323 01/24/24 0317 01/24/24 1554 01/25/24 0330  NA 134* 133* 135 132* 130*  K 3.5 3.4* 3.7 3.6 3.3*  CL 94* 97* 97* 94* 93*  CO2 27 23 25 27 27   GLUCOSE 134* 105* 120* 173* 147*  BUN 46* 47* 52* 31* 35*  CREATININE 4.70* 4.68* 5.05* 3.37* 3.62*  CALCIUM  8.4* 8.0* 8.5* 8.3* 8.3*  MG  --   --  2.0  --  1.9  PHOS  --   --  3.9 2.5 2.7   GFR: Estimated Creatinine Clearance: 19.1 mL/min (A) (by C-G formula based on SCr of 3.62 mg/dL (H)). Recent Labs  Lab 01/22/24 1745 01/23/24 0323 01/23/24 0430 01/23/24 2200 01/24/24 0317 01/24/24 2318 01/25/24 0330  PROCALCITON  --   --   --  100.00  --   --   --   WBC 11.8*  --  5.8  --  8.9  --  9.5   LATICACIDVEN 2.2* 1.5  --  1.4  --  1.5  --     Liver Function Tests: Recent Labs  Lab 01/22/24 1745 01/24/24 0317 01/24/24 1554 01/25/24 0330  AST 73* 34  --  22  ALT 70* 48*  --  33  ALKPHOS 86 66  --  64  BILITOT 0.5 0.9  --  0.7  PROT 7.6 6.7  --  6.5  ALBUMIN  3.1* 2.7* 2.7* 2.6*   No results for input(s): LIPASE, AMYLASE in the last 168 hours. No results for input(s): AMMONIA in the last 168 hours.  ABG    Component Value Date/Time   HCO3 27.2 01/23/2024 2300   TCO2 17 (L) 11/14/2021 0718   O2SAT 81.4 01/23/2024 2300     Coagulation Profile: Recent Labs  Lab 01/23/24 0343 01/23/24 1859  INR 2.1*  2.2*    Cardiac Enzymes: No results for input(s): CKTOTAL, CKMB, CKMBINDEX, TROPONINI in the last 168 hours.  HbA1C: Hgb A1c MFr Bld  Date/Time Value Ref Range Status  01/23/2024 04:30 AM 5.9 (H) 4.8 - 5.6 % Final    Comment:    (NOTE) Diagnosis of Diabetes The following HbA1c ranges recommended by the American Diabetes Association (ADA) may be used as an aid in the diagnosis of diabetes mellitus.  Hemoglobin             Suggested A1C NGSP%              Diagnosis  <5.7                   Non Diabetic  5.7-6.4                Pre-Diabetic  >6.4                   Diabetic  <7.0                   Glycemic control for                       adults with diabetes.    01/23/2024 03:23 AM 5.7 (H) 4.8 - 5.6 % Final    Comment:    (NOTE) Diagnosis of Diabetes The following HbA1c ranges recommended by the American Diabetes Association (ADA) may be used as an aid in the diagnosis of diabetes mellitus.  Hemoglobin             Suggested A1C NGSP%              Diagnosis  <5.7                   Non Diabetic  5.7-6.4                Pre-Diabetic  >6.4                   Diabetic  <7.0                   Glycemic control for                       adults with diabetes.      CBG: Recent Labs  Lab 01/24/24 0747 01/24/24 1123 01/24/24 1553  01/24/24 2058 01/25/24 0749  GLUCAP 119* 92 161* 126* 137*    Review of Systems: Positives in BOLD   Gen: Denies fever, chills, weight change, fatigue, night sweats HEENT: Denies blurred vision, double vision, hearing loss, tinnitus, sinus congestion, rhinorrhea, sore throat, neck stiffness, dysphagia PULM: Denies shortness of breath, cough, sputum production, hemoptysis, wheezing CV: Denies chest pain, edema, orthopnea, paroxysmal nocturnal dyspnea, palpitations GI: abdominal discomfort, nausea, vomiting, diarrhea, hematochezia, melena, constipation, change in bowel habits GU: Denies dysuria, hematuria, polyuria, oliguria, urethral discharge Endocrine: Denies hot or cold intolerance, polyuria, polyphagia or appetite change Derm: Denies rash, dry skin, scaling or peeling skin change Heme: Denies easy bruising, bleeding, bleeding gums Neuro: Denies headache, numbness, weakness, slurred speech, loss of memory or consciousness  Past Medical History:  He,  has a past medical history of Anxiety, Arthritis, Depression, Diabetes mellitus without complication (HCC), End stage renal disease (HCC), Hypertension, Pneumonia, and Renal disorder.   Surgical History:   Past Surgical History:  Procedure Laterality Date   A/V FISTULAGRAM Left 06/08/2023  Procedure: A/V Fistulagram;  Surgeon: Jama Cordella MATSU, MD;  Location: [facility];  Service: Cardiovascular;  Laterality: Left;   A/V SHUNT INTERVENTION Left 09/29/2023   Procedure: A/V SHUNT INTERVENTION;  Surgeon: Marea Selinda RAMAN, MD;  Location: [facility];  Service: Cardiovascular;  Laterality: Left;   APPLICATION OF WOUND VAC Right 11/14/2021   Procedure: APPLICATION OF WOUND VAC;  Surgeon: Jama Cordella MATSU, MD;  Location: [facility] ORS;  Service: Vascular;  Laterality: Right;   AV FISTULA PLACEMENT Left 11/14/2021   Procedure: ARTERIOVENOUS (AV) FISTULA CREATION ( BRACHIAL CEPHALIC);  Surgeon: Jama Cordella MATSU, MD;  Location: [facility]  ORS;  Service: Vascular;  Laterality: Left;     Social History:   reports that he has never smoked. He has never used smokeless tobacco. He reports that he does not currently use drugs. He reports that he does not drink alcohol.   Family History:  His family history is not on file.   Allergies Allergies[1]   Home Medications  Prior to Admission medications  Medication Sig Start Date End Date Taking? Authorizing Provider  acetaminophen  (TYLENOL ) 325 MG tablet Take 650 mg by mouth every 6 (six) hours as needed.   Yes [provider]  ALPRAZolam  (XANAX ) 0.5 MG tablet Take 0.5 mg by mouth 3 (three) times daily as needed for sleep. 02/05/21  Yes [provider]  amiodarone  (PACERONE ) 200 MG tablet Take 2 tablets (400 mg total) by mouth 2 (two) times daily for 7 days, THEN 1 tablet (200 mg total) daily. 07/24/23 01/22/24 Yes Sreenath, Sudheer B, MD  apixaban  (ELIQUIS ) 2.5 MG TABS tablet Take 1 tablet (2.5 mg total) by mouth 2 (two) times daily. 07/24/23 01/23/24 Yes Sreenath, Sudheer B, MD  Cholecalciferol  125 MCG (5000 UT) TABS Take 5,000 Units by mouth daily.   Yes [provider]  clopidogrel  (PLAVIX ) 75 MG tablet Take 1 tablet (75 mg total) by mouth daily. 06/09/23  Yes Schnier, Cordella MATSU, MD  diclofenac  Sodium (VOLTAREN ) 1 % GEL Apply 2 g topically 4 (four) times daily. 07/24/23  Yes Sreenath, Sudheer B, MD  Docusate Sodium  (DSS) 100 MG CAPS Take 100 mg by mouth at bedtime.   Yes [provider]  donepezil  (ARICEPT ) 10 MG tablet Take 10 mg by mouth at bedtime. 05/14/21  Yes [provider]  finasteride  (PROSCAR ) 5 MG tablet Take 1 tablet (5 mg total) by mouth daily. 08/12/19  Yes Stoioff, Glendia BROCKS, MD  lidocaine -prilocaine  (EMLA ) cream Apply 1 Application topically as needed (Dialysis). 08/18/23  Yes [provider]  metoprolol  succinate (TOPROL -XL) 25 MG 24 hr tablet Take 0.5 tablets (12.5 mg total) by mouth daily. 07/24/23 01/22/24 Yes Sreenath,  Sudheer B, MD  tamsulosin  (FLOMAX ) 0.4 MG CAPS capsule Take 1 capsule (0.4 mg total) by mouth daily. 08/12/19  Yes Stoioff, Glendia BROCKS, MD  enalapril  (VASOTEC ) 20 MG tablet Take 20 mg by mouth 2 (two) times daily. Patient not taking: Reported on 01/20/2024 08/12/21   [provider]  furosemide  (LASIX ) 40 MG tablet Take 1 tablet (40 mg total) by mouth every other day. Patient not taking: Reported on 01/20/2024 07/25/23   Jhonny Calvin NOVAK, MD  glipiZIDE  (GLUCOTROL  XL) 5 MG 24 hr tablet Take by mouth. Patient not taking: Reported on 01/20/2024 12/01/11   [provider]  LOKELMA 10 g PACK packet Take 1 packet by mouth daily. Patient not taking: Reported on 01/20/2024 07/12/23   [provider]  sodium bicarbonate  650 MG tablet Take  650 mg by mouth 2 (two) times daily. Patient not taking: Reported on 01/20/2024 06/16/23   [provider]     Critical care time: 50 minutes       Lonell Moose, AGNP  Pulmonary/Critical Care Pager [phone] (please enter 7 digits) PCCM Consult Pager [phone] (please enter 7 digits)           [1] No Known Allergies  "

## 2024-01-25 NOTE — Consult Note (Addendum)
 NAME: Jeffery Joseph  DOB: 1940/03/10  MRN: [id number]  Date/Time: 01/25/2024 2:49 PM  REQUESTING PROVIDERAutoconsult Subjective:  REASON FOR CONSULT: enterococcus fecalis bacteremia ?History from daughter at bed side Pt is limited historian KEY CEN is a 84 y.o. 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  Temp 98 F (36.7 C)  Pulse Rate 111 !  Resp 20  SpO2 100 %     Latest Reference Range & Units 01/22/24 17:45  WBC 4.0 - 10.5 K/uL 11.8 (H)  Hemoglobin 13.0 - 17.0 g/dL 8.0 (L) [8]  HCT 60.9 - 52.0 % 25.1 (L)  Platelets 150 - 400 K/uL 209  Creatinine 0.61 - 1.24 mg/dL 5.29 (H)   CXR showed b/l interstitial infiltrate suggestive of edema CT abdomen showed splenic abscess Blood culture sent and started on vnco and ceftriaxone  Admitted to hospitalist service and then transferred to ICU for hypotension As blood culture came back positive for enterococcus I am seeing the patient     Past Medical History:  Diagnosis Date   Anxiety    Arthritis    Depression    Diabetes mellitus without complication (HCC)    End stage renal disease (HCC)    Hypertension    Pneumonia    Renal disorder     Past Surgical History:  Procedure Laterality Date   A/V FISTULAGRAM Left 06/08/2023   Procedure: A/V Fistulagram;  Surgeon: Jama Cordella MATSU, MD;  Location: [facility];  Service: Cardiovascular;  Laterality: Left;   A/V SHUNT INTERVENTION Left 09/29/2023   Procedure: A/V SHUNT INTERVENTION;  Surgeon: Marea Selinda RAMAN, MD;  Location: [facility];  Service: Cardiovascular;  Laterality: Left;   APPLICATION OF WOUND VAC Right 11/14/2021   Procedure: APPLICATION OF WOUND VAC;  Surgeon: Jama Cordella MATSU, MD;  Location: [facility] OR[facility];  Service: Vascular;  Laterality: Right;   AV FISTULA PLACEMENT Left 11/14/2021   Procedure: ARTERIOVENOUS (AV) FISTULA CREATION ( BRACHIAL CEPHALIC);  Surgeon: Jama Cordella MATSU, MD;  Location: [facility] OR[facility];  Service: Vascular;  Laterality: Left;    Social History   Socioeconomic History   Marital status: Married    Spouse name: ,Loraine   Number of children: Not on file   Years of education: Not on file   Highest education level: Not on file  Occupational History   Not on file  Tobacco Use   Smoking status: Never   Smokeless tobacco: Never  Vaping Use   Vaping status: Never Used  Substance and Sexual Activity   Alcohol use: No   Drug use: Not Currently   Sexual activity: Not Currently  Other Topics Concern   Not on file  Social History Narrative   Not on file   Social Drivers of Health   Tobacco Use: Low Risk (01/22/2024)   Patient History    Smoking Tobacco Use: Never    Smokeless Tobacco Use: Never    Passive Exposure: Not  on file  Recent Concern: Tobacco Use - Medium Risk (12/01/2023)   Received from [facility]   Patient History    Passive Exposure: Not on file    Smokeless Tobacco Use: Never    Smoking Tobacco Use: Former  Programmer, Applications: Low Risk  (12/01/2023)   Received from [facility]   Overall Financial Resource Strain (CARDIA)    Difficulty of Paying Living Expenses: Not hard at all  Food Insecurity: Patient Unable To Answer (01/24/2024)   Epic    Worried About Programme Researcher, Broadcasting/film/video in the Last Year: Patient unable to answer    Ran Out of Food in the Last Year: Patient unable to answer  Transportation Needs: Patient Unable To Answer (01/24/2024)   Epic    Lack of Transportation (Medical): Patient  unable to answer    Lack of Transportation (Non-Medical): Patient unable to answer  Physical Activity: Not on file  Stress: Not on file  Social Connections: Unknown (01/24/2024)   Social Connection and Isolation Panel    Frequency of Communication with Friends and Family: Patient unable to answer    Frequency of Social Gatherings with Friends and Family: Patient unable to answer    Attends Religious Services: Patient unable to answer    Active Member of Clubs or Organizations: Not on file    Attends Banker Meetings: Patient unable to answer    Marital Status: Patient unable to answer  Intimate Partner Violence: Patient Unable To Answer (01/24/2024)   Epic    Fear of Current or Ex-Partner: Patient unable to answer    Emotionally Abused: Patient unable to answer    Physically Abused: Patient unable to answer    Sexually Abused: Patient unable to answer  Depression (EYV7-0): Not on file  Alcohol Screen: Not on file  Housing: Unknown (01/24/2024)   Epic    Unable to Pay for Housing in the Last Year: Patient unable to answer    Number of Times Moved in the Last Year: 0    Homeless in the Last Year: Patient unable to answer  Utilities: Patient Unable To Answer (01/24/2024)   Epic    Threatened with loss of utilities: Patient unable to answer  Health Literacy: Not on file    History reviewed. No pertinent family history. Allergies[1] I? Current Facility-Administered Medications  Medication Dose Route Frequency Provider Last Rate Last Admin   0.9 %  sodium chloride  infusion  250 mL Intravenous Continuous Nelson, Dana G, NP   Held at 01/25/24 9043   acetaminophen  (TYLENOL ) tablet 650 mg  650 mg Oral Q6H PRN Mansy, Jan A, MD   650 mg at 01/23/24 1911   Or   acetaminophen  (TYLENOL ) suppository 650 mg  650 mg Rectal Q6H PRN Mansy, Jan A, MD       ALPRAZolam  (XANAX ) tablet 0.5 mg  0.5 mg Oral TID PRN Mansy, Jan A, MD   0.5 mg at 01/24/24 1508   amiodarone  (PACERONE ) tablet 100 mg   100 mg Oral Daily Arlon Honey W, DO   100 mg at 01/25/24 0930   ampicillin  (OMNIPEN) 2 g in sodium chloride  0.9 % 100 mL IVPB  2 g Intravenous Q12H Clair Marolyn NOVAK, RPH   Stopped at 01/25/24 9044   Chlorhexidine  Gluconate Cloth 2 % PADS 6 each  6 each Topical Daily Kathrene Almarie Bake, NP   6 each at 01/25/24 0930   cholecalciferol  (VITAMIN D3) 25 MCG (1000 UNIT) tablet 5,000 Units  5,000 Units Oral Daily Mansy, Jan A, MD   5,000 Units at 01/25/24 0930   diclofenac  Sodium (VOLTAREN ) 1 % topical gel 2 g  2 g Topical QID Mansy, Jan A, MD   2 g at 01/25/24 9068   docusate sodium  (COLACE) capsule 100 mg  100 mg Oral QHS Mansy, Jan A, MD   100 mg at 01/24/24 2144   donepezil  (ARICEPT ) tablet 10 mg  10 mg Oral QHS Mansy, Jan A, MD   10 mg at 01/24/24 2144   finasteride  (PROSCAR ) tablet 5 mg  5 mg Oral Daily Mansy, Jan A, MD   5 mg at 01/25/24 0930   insulin  aspart (novoLOG ) injection 0-15 Units  0-15 Units Subcutaneous TID WC Mansy, Jan A, MD   2 Units at 01/25/24 1158   insulin  aspart (novoLOG ) injection 0-5 Units  0-5 Units Subcutaneous QHS Mansy, Jan A, MD       lidocaine  (PF) (XYLOCAINE ) 1 % injection 5 mL  5 mL Intradermal PRN Levorn Ramonita SQUIBB, NP       magnesium  hydroxide (MILK OF MAGNESIA) suspension 30 mL  30 mL Oral Daily PRN Mansy, Jan A, MD       midodrine  (PROAMATINE ) tablet 10 mg  10 mg Oral TID WC Mansy, Jan A, MD   10 mg at 01/25/24 1158   morphine  (PF) 2 MG/ML injection 2 mg  2 mg Intravenous Q2H PRN Mansy, Jan A, MD       nitroGLYCERIN  (NITROSTAT ) SL tablet 0.4 mg  0.4 mg Sublingual Q5 min PRN Mansy, Jan A, MD       norepinephrine  (LEVOPHED ) 4mg  in (0.016 mg/mL) premix infusion  0-10 mcg/min Intravenous Titrated Nelson, Dana G, NP 30 mL/hr at 01/25/24 1211 8 mcg/min at 01/25/24 1211   ondansetron  (ZOFRAN ) tablet 4 mg  4 mg Oral Q6H PRN Mansy, Jan A, MD       Or   ondansetron  (ZOFRAN ) injection 4 mg  4 mg Intravenous Q6H PRN Mansy, Madison LABOR, MD       Oral care mouth rinse   15 mL Mouth Rinse PRN Kasa, Kurian, MD       oseltamivir  (TAMIFLU ) capsule 30 mg  30 mg Oral Q M,W,F-1800 Mansy, Jan A, MD   30 mg at 01/24/24 1811   pantoprazole  (PROTONIX ) injection 40 mg  40 mg Intravenous Daily Kasa, Kurian, MD   40 mg at 01/25/24 0930   tamsulosin  (FLOMAX ) capsule 0.4 mg  0.4 mg Oral Daily Mansy, Jan A, MD   0.4 mg at 01/25/24 0930   traZODone  (DESYREL ) tablet 25 mg  25 mg Oral QHS PRN Mansy, Jan A, MD         Abtx:  Anti-infectives (From admission, onward)    Start     Dose/Rate Route Frequency Ordered Stop   01/25/24 1000  ampicillin  (OMNIPEN) 2 g in sodium chloride  0.9 % 100 mL IVPB        2 g 300 mL/hr over 20 Minutes Intravenous Every 12 hours 01/25/24 0823     01/25/24 0145  ampicillin  (OMNIPEN) 2 g in sodium chloride  0.9 % 100 mL IVPB  Status:  Discontinued        2 g 300 mL/hr over 20 Minutes Intravenous Every 24 hours 01/25/24 0049 01/25/24 0823   01/24/24 1800  oseltamivir  (TAMIFLU ) capsule 30 mg        30 mg Oral Every M-W-F (1800) 01/23/24 0125 01/28/24 1759   01/24/24 1800  ceFEPIme  (MAXIPIME ) 1  g in sodium chloride  0.9 % 100 mL IVPB  Status:  Discontinued       Placed in Followed by Linked Group   1 g 200 mL/hr over 30 Minutes Intravenous Daily-1800 01/24/24 0124 01/24/24 1013   01/24/24 1800  vancomycin  (VANCOREADY) IVPB 750 mg/150 mL  Status:  Discontinued        750 mg 150 mL/hr over 60 Minutes Intravenous Every M-W-F (Hemodialysis) 01/24/24 0126 01/24/24 1159   01/24/24 1800  cefTRIAXone  (ROCEPHIN ) 2 g in sodium chloride  0.9 % 100 mL IVPB  Status:  Discontinued        2 g 200 mL/hr over 30 Minutes Intravenous Every 24 hours 01/24/24 1013 01/25/24 0049   01/24/24 1100  doxycycline  (VIBRAMYCIN ) 100 mg in sodium chloride  0.9 % 250 mL IVPB  Status:  Discontinued        100 mg 125 mL/hr over 120 Minutes Intravenous Every 12 hours 01/24/24 1013 01/25/24 1009   01/24/24 0215  ceFEPIme  (MAXIPIME ) 1 g in sodium chloride  0.9 % 100 mL IVPB       Placed  in Followed by Linked Group   1 g 200 mL/hr over 30 Minutes Intravenous  Once 01/24/24 0124 01/24/24 1121   01/24/24 0215  vancomycin  (VANCOREADY) IVPB 1500 mg/300 mL        1,500 mg 150 mL/hr over 120 Minutes Intravenous  Once 01/24/24 0125 01/24/24 0426   01/24/24 0100  metroNIDAZOLE  (FLAGYL ) IVPB 500 mg  Status:  Discontinued        500 mg 100 mL/hr over 60 Minutes Intravenous Every 12 hours 01/24/24 0050 01/24/24 1013   01/23/24 0130  oseltamivir  (TAMIFLU ) capsule 30 mg        30 mg Oral  Once 01/23/24 0105 01/23/24 0155       REVIEW OF SYSTEMS:  Const:  fever,  chills, negative weight loss Eyes: negative diplopia or visual changes, negative eye pain ENT: negative coryza, negative sore throat Resp: cough, , dyspnea Cards:  chest pain, no palpitations, no lower extremity edema GU: negative for frequency, dysuria and hematuria GI: Negative for abdominal pain, diarrhea, bleeding, constipation Skin: negative for rash and pruritus Heme: negative for easy bruising and gum/nose bleeding MS:  weakness Neurolo:negative for headaches, dizziness, vertigo, memory problems  Psych: negative for feelings of anxiety, depression  Endocrine: , diabetes Allergy/Immunology- negative for any medication or food allergies  Objective:  VITALS:  BP (!) 111/47   Pulse (!) 113   Temp 98.3 F (36.8 C) (Oral)   Resp 20   Ht 6' 5 (1.956 m)   Wt 99.4 kg   SpO2 99%   BMI 25.99 kg/m    PHYSICAL EXAM:  General: Alert, cooperative, no distress, appears stated age.  Head: Normocephalic, without obvious abnormality, atraumatic. Eyes: Conjunctivae clear, anicteric sclerae. Pupils are equal ENT Nares normal. No drainage or sinus tenderness. Lips, mucosa, and tongue normal. No Thrush Neck: Supple, symmetrical, no adenopathy, thyroid: non tender no carotid bruit and no JVD. Lungs:b/l air entry few crepts bases Heart: systolic murmur  Abdomen: Soft, non-tender,not distended. Bowel sounds normal.  No masses Extremities: atraumatic, no cyanosis. No edema. No clubbing Skin: No rashes or lesions. Or bruising Lymph: Cervical, supraclavicular normal. Neurologic: Grossly non-focal Pertinent Labs Lab Results CBC    Component Value Date/Time   WBC 11.3 (H) 01/25/2024 1309   RBC 3.70 (L) 01/25/2024 1309   HGB 9.1 (L) 01/25/2024 1309   HGB 11.5 (L) 05/06/2012 0843   HCT 28.6 (L) 01/25/2024  1309   HCT 35.5 (L) 05/06/2012 0843   PLT 202 01/25/2024 1309   PLT 146 (L) 05/06/2012 0843   MCV 77.3 (L) 01/25/2024 1309   MCV 83 05/06/2012 0843   MCH 24.6 (L) 01/25/2024 1309   MCHC 31.8 01/25/2024 1309   RDW 18.6 (H) 01/25/2024 1309   RDW 15.3 (H) 05/06/2012 0843   LYMPHSABS 1.4 01/25/2024 1309   LYMPHSABS 0.8 (L) 05/06/2012 0843   MONOABS 0.9 01/25/2024 1309   MONOABS 0.5 05/06/2012 0843   EOSABS 0.1 01/25/2024 1309   EOSABS 0.2 05/06/2012 0843   BASOSABS 0.1 01/25/2024 1309   BASOSABS 0.1 05/06/2012 0843       Latest Ref Rng & Units 01/25/2024    3:30 AM 01/24/2024    3:54 PM 01/24/2024    3:17 AM  CMP  Glucose 70 - 99 mg/dL 852  826  879   BUN 8 - 23 mg/dL 35  31  52   Creatinine 0.61 - 1.24 mg/dL 6.37  6.62  4.94   Sodium 135 - 145 mmol/L 130  132  135   Potassium 3.5 - 5.1 mmol/L 3.3  3.6  3.7   Chloride 98 - 111 mmol/L 93  94  97   CO2 22 - 32 mmol/L 27  27  25    Calcium  8.9 - 10.3 mg/dL 8.3  8.3  8.5   Total Protein 6.5 - 8.1 g/dL 6.5   6.7   Total Bilirubin 0.0 - 1.2 mg/dL 0.7   0.9   Alkaline Phos 38 - 126 U/L 64   66   AST 15 - 41 U/L 22   34   ALT 0 - 44 U/L 33   48       Microbiology: Recent Results (from the past 240 hours)  Culture, blood (Routine X 2) w Reflex to ID Panel     Status: Abnormal (Preliminary result)   Collection Time: 01/23/24 10:33 PM   Specimen: BLOOD RIGHT ARM  Result Value Ref Range Status   Specimen Description   Final    BLOOD RIGHT ARM Performed at [facility], [address], [place], [place] [address]    Special  Requests   Final    BOTTLES DRAWN AEROBIC AND ANAEROBIC Blood Culture results may not be optimal due to an inadequate volume of blood received in culture bottles Performed at [facility], [address] [place], [place] [address]    Culture  Setup Time   Final    GRAM POSITIVE COCCI IN BOTH AEROBIC AND ANAEROBIC BOTTLES CRITICAL RESULT CALLED TO, READ BACK BY AND VERIFIED WITH: KRISTAN MERRILL 01/24/24 1252 MU Performed at [facility], [address], [place], [place] [address]    Culture ENTEROCOCCUS FAECALIS (A)  Final   Report Status PENDING  Incomplete  Resp panel by RT-PCR (RSV, Flu A&B, Covid) Anterior Nasal Swab     Status: None   Collection Time: 01/23/24 10:33 PM   Specimen: Anterior Nasal Swab  Result Value Ref Range Status   SARS Coronavirus 2 by RT PCR NEGATIVE NEGATIVE Final    Comment: (NOTE) SARS-CoV-2 target nucleic acids are NOT DETECTED.  The SARS-CoV-2 RNA is generally detectable in upper respiratory specimens during the acute phase of infection. The lowest concentration of SARS-CoV-2 viral copies this assay can detect is 138 copies/mL. A negative result does not preclude SARS-Cov-2 infection and should not be used as the sole basis for treatment or other patient management decisions. A negative  result may occur with  improper specimen collection/handling, submission of specimen other than nasopharyngeal swab, presence of viral mutation(s) within the areas targeted by this assay, and inadequate number of viral copies(<138 copies/mL). A negative result must be combined with clinical observations, patient history, and epidemiological information. The expected result is Negative.  Fact Sheet for Patients:  bloggercourse.com  Fact Sheet for Healthcare Providers:  seriousbroker.it  This test is no t yet approved or cleared by the [place]  FDA and  has been authorized for detection and/or  diagnosis of SARS-CoV-2 by FDA under an Emergency Use Authorization (EUA). This EUA will remain  in effect (meaning this test can be used) for the duration of the COVID-19 declaration under Section 564(b)(1) of the Act, 21 U.S.C.section 360bbb-3(b)(1), unless the authorization is terminated  or revoked sooner.       Influenza A by PCR NEGATIVE NEGATIVE Final   Influenza B by PCR NEGATIVE NEGATIVE Final    Comment: (NOTE) The Xpert Xpress SARS-CoV-2/FLU/RSV plus assay is intended as an aid in the diagnosis of influenza from Nasopharyngeal swab specimens and should not be used as a sole basis for treatment. Nasal washings and aspirates are unacceptable for Xpert Xpress SARS-CoV-2/FLU/RSV testing.  Fact Sheet for Patients: bloggercourse.com  Fact Sheet for Healthcare Providers: seriousbroker.it  This test is not yet approved or cleared by the [place]  FDA and has been authorized for detection and/or diagnosis of SARS-CoV-2 by FDA under an Emergency Use Authorization (EUA). This EUA will remain in effect (meaning this test can be used) for the duration of the COVID-19 declaration under Section 564(b)(1) of the Act, 21 U.S.C. section 360bbb-3(b)(1), unless the authorization is terminated or revoked.     Resp Syncytial Virus by PCR NEGATIVE NEGATIVE Final    Comment: (NOTE) Fact Sheet for Patients: bloggercourse.com  Fact Sheet for Healthcare Providers: seriousbroker.it  This test is not yet approved or cleared by the [place]  FDA and has been authorized for detection and/or diagnosis of SARS-CoV-2 by FDA under an Emergency Use Authorization (EUA). This EUA will remain in effect (meaning this test can be used) for the duration of the COVID-19 declaration under Section 564(b)(1) of the Act, 21 U.S.C. section 360bbb-3(b)(1), unless the authorization is terminated  or revoked.  Performed at [facility], [address] [place], [place] [address]   Respiratory (~20 pathogens) panel by PCR     Status: None   Collection Time: 01/23/24 10:33 PM   Specimen: Nasopharyngeal Swab; Respiratory  Result Value Ref Range Status   Adenovirus NOT DETECTED NOT DETECTED Final   Coronavirus 229E NOT DETECTED NOT DETECTED Final    Comment: (NOTE) The Coronavirus on the Respiratory Panel, DOES NOT test for the novel  Coronavirus (2019 nCoV)    Coronavirus HKU1 NOT DETECTED NOT DETECTED Final   Coronavirus NL63 NOT DETECTED NOT DETECTED Final   Coronavirus OC43 NOT DETECTED NOT DETECTED Final   Metapneumovirus NOT DETECTED NOT DETECTED Final   Rhinovirus / Enterovirus NOT DETECTED NOT DETECTED Final   Influenza A NOT DETECTED NOT DETECTED Final   Influenza B NOT DETECTED NOT DETECTED Final   Parainfluenza Virus 1 NOT DETECTED NOT DETECTED Final   Parainfluenza Virus 2 NOT DETECTED NOT DETECTED Final   Parainfluenza Virus 3 NOT DETECTED NOT DETECTED Final   Parainfluenza Virus 4 NOT DETECTED NOT DETECTED Final   Respiratory Syncytial Virus NOT DETECTED NOT DETECTED Final   Bordetella pertussis NOT DETECTED NOT DETECTED Final   Bordetella Parapertussis NOT  DETECTED NOT DETECTED Final   Chlamydophila pneumoniae NOT DETECTED NOT DETECTED Final   Mycoplasma pneumoniae NOT DETECTED NOT DETECTED Final    Comment: Performed at [facility], [address], [place], [place] [address]  Culture, blood (Routine X 2) w Reflex to ID Panel     Status: Abnormal (Preliminary result)   Collection Time: 01/24/24 12:14 AM   Specimen: BLOOD LEFT HAND  Result Value Ref Range Status   Specimen Description   Final    BLOOD LEFT HAND Performed at [facility], [address], [place], [place] [address]    Special Requests   Final    BOTTLES DRAWN AEROBIC AND ANAEROBIC Blood Culture results may not be optimal due to an inadequate volume of blood received  in culture bottles Performed at [facility], [address] [place], [place] [address]    Culture  Setup Time   Final    GRAM POSITIVE COCCI IN BOTH AEROBIC AND ANAEROBIC BOTTLES CRITICAL RESULT CALLED TO, READ BACK BY AND VERIFIED WITH: PHARMD ESABRA NAPOLEON [id number] @ 2318 FH    Culture (A)  Final    ENTEROCOCCUS FAECALIS SUSCEPTIBILITIES TO FOLLOW Performed at [facility], [address], [place], [place] [address]    Report Status PENDING  Incomplete  Blood Culture ID Panel (Reflexed)     Status: Abnormal   Collection Time: 01/24/24 12:14 AM  Result Value Ref Range Status   Enterococcus faecalis DETECTED (A) NOT DETECTED Final    Comment: CRITICAL RESULT CALLED TO, READ BACK BY AND VERIFIED WITH: PHARMD ESABRA NAPOLEON [id number] @ 2318 FH    Enterococcus Faecium NOT DETECTED NOT DETECTED Final   Listeria monocytogenes NOT DETECTED NOT DETECTED Final   Staphylococcus species NOT DETECTED NOT DETECTED Final   Staphylococcus aureus (BCID) NOT DETECTED NOT DETECTED Final   Staphylococcus epidermidis NOT DETECTED NOT DETECTED Final   Staphylococcus lugdunensis NOT DETECTED NOT DETECTED Final   Streptococcus species NOT DETECTED NOT DETECTED Final   Streptococcus agalactiae NOT DETECTED NOT DETECTED Final   Streptococcus pneumoniae NOT DETECTED NOT DETECTED Final   Streptococcus pyogenes NOT DETECTED NOT DETECTED Final   A.calcoaceticus-baumannii NOT DETECTED NOT DETECTED Final   Bacteroides fragilis NOT DETECTED NOT DETECTED Final   Enterobacterales NOT DETECTED NOT DETECTED Final   Enterobacter cloacae complex NOT DETECTED NOT DETECTED Final   Escherichia coli NOT DETECTED NOT DETECTED Final   Klebsiella aerogenes NOT DETECTED NOT DETECTED Final   Klebsiella oxytoca NOT DETECTED NOT DETECTED Final   Klebsiella pneumoniae NOT DETECTED NOT DETECTED Final   Proteus species NOT DETECTED NOT DETECTED Final   Salmonella species NOT DETECTED NOT DETECTED Final   Serratia  marcescens NOT DETECTED NOT DETECTED Final   Haemophilus influenzae NOT DETECTED NOT DETECTED Final   Neisseria meningitidis NOT DETECTED NOT DETECTED Final   Pseudomonas aeruginosa NOT DETECTED NOT DETECTED Final   Stenotrophomonas maltophilia NOT DETECTED NOT DETECTED Final   Candida albicans NOT DETECTED NOT DETECTED Final   Candida auris NOT DETECTED NOT DETECTED Final   Candida glabrata NOT DETECTED NOT DETECTED Final   Candida krusei NOT DETECTED NOT DETECTED Final   Candida parapsilosis NOT DETECTED NOT DETECTED Final   Candida tropicalis NOT DETECTED NOT DETECTED Final   Cryptococcus neoformans/gattii NOT DETECTED NOT DETECTED Final   Vancomycin  resistance NOT DETECTED NOT DETECTED Final    Comment: Performed at [facility], [address] [place], [place] [address]  MRSA Next Gen by  PCR, Nasal     Status: None   Collection Time: 01/24/24  2:03 AM   Specimen: Nasal Mucosa; Nasal Swab  Result Value Ref Range Status   MRSA by PCR Next Gen NOT DETECTED NOT DETECTED Final    Comment: (NOTE) The GeneXpert MRSA Assay (FDA approved for NASAL specimens only), is one component of a comprehensive MRSA colonization surveillance program. It is not intended to diagnose MRSA infection nor to guide or monitor treatment for MRSA infections. Test performance is not FDA approved in patients less than 62 years old. Performed at [facility], [address] [place], [place] [address]     Lines and Device Date on insertion # of days DC  Central line     Foley     ETT      Patient has: []  acute illness w/systemic sxs  [mod] [x]  illness posing risk to life or function  [high]  I reviewed:  (3+) []  primary team note [x]  consultant note(s) []  procedure/op note(s) []  micro result(s)   [x]  CBC results [x]  chemistry results []  radiology report(s) []  nursing note(s)  I independently visualized:  (any)   []  cxs/plates in lab []  plain film images [x]  CT images []  PET images   []   path slide(s) []  ECG tracing []  MRI images []  nuclear scan  I discussed: (any) []  micro and/or path w/lab personnel []  drug options and/or interactions w/ID pharmD   []  procedure/OR findings w/other MD(s) []  echo and/or imaging w/other MD(s)   []  mgm't w/attending(s) involved in case []  setting up home abx w/OPAT team  Mgm't requires: []  prescription drug(s)  [mod] [x]  intensive toxicity monitoring  [high]   IMAGING RESULTS: Interstitial edema b/l  I have personally reviewed the films ?Splenic lesion - possible abscess    Impression/Recommendation ?84 y.o. 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     [1] No Known Allergies

## 2024-01-25 NOTE — Progress Notes (Signed)
 [facility] CLINIC CARDIOLOGY PROGRESS NOTE   Patient ID: Jeffery Joseph MRN: [id number] DOB/AGE: 1940/11/17 84 y.o.  Admit date: 01/22/2024 Referring Physician Dr. Madison Peaches Primary Physician Epifanio Alm SQUIBB, MD  Primary Cardiologist Dr. Florencio Reason for Consultation AoCHF  HPI: Jeffery Joseph is a 84 y.o. 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 Cardiology was consulted for further evaluation.   Interval History:  - Patient seen and examined this morning, resting comfortably in hospital bed. - Remains on levo.  - Feels his breathing is overall stable.  Denies any chest discomfort or palpitations. HR overall stable. - Will consider transitioning heparin  to eliquis  pending CT abd.  Review of systems complete and found to be negative unless listed above   Vitals:   01/25/24 0717 01/25/24 0730 01/25/24 0800 01/25/24 0815  BP: (!) 109/36 (!) 109/52 (!) 76/50 (!) 112/56  Pulse: (!) 110 (!) 112 (!) 114 (!) 124  Resp: (!) 21 16 13  (!) 21  Temp:   97.9 F (36.6 C)   TempSrc:   Oral   SpO2: 99% 98% 100% 91%  Weight:      Height:         Intake/Output Summary (Last 24 hours) at 01/25/2024 9050 Last data filed at 01/25/2024 0700 Gross per 24 hour  Intake 2417.14 ml  Output 2150 ml  Net 267.14 ml     PHYSICAL EXAM General: Chronically ill-appearing male, well nourished, in no acute distress. HEENT: Normocephalic and atraumatic. Neck: No JVD.  Lungs: Normal respiratory effort on room air. Clear bilaterally to auscultation. No wheezes, crackles, rhonchi.  Heart: HRR, elevated rate. Normal S1 and S2 without gallops or murmurs. Radial & DP pulses 2+ bilaterally. Abdomen: Non-distended appearing.  Msk: Normal strength and tone for age. Extremities: No clubbing, cyanosis or edema.    Neuro: Alert and oriented X 3. Psych: Mood appropriate, affect congruent.    LABS: Basic Metabolic Panel: Recent Labs    01/24/24 0317 01/24/24 1554 01/25/24 0330  NA 135 132* 130*  K 3.7 3.6 3.3*  CL 97* 94* 93*  CO2 25 27 27   GLUCOSE 120* 173* 147*  BUN 52* 31* 35*  CREATININE 5.05* 3.37* 3.62*  CALCIUM  8.5* 8.3* 8.3*  MG 2.0  --  1.9  PHOS 3.9 2.5 2.7   Liver Function Tests: Recent Labs    01/24/24 0317 01/24/24 1554 01/25/24 0330  AST 34  --  22  ALT 48*  --  33  ALKPHOS 66  --  64  BILITOT 0.9  --  0.7  PROT 6.7  --  6.5  ALBUMIN  2.7* 2.7* 2.6*   No results for input(s): LIPASE, AMYLASE in the last 72 hours. CBC: Recent Labs    01/24/24 0317 01/24/24 1005 01/25/24 0330  WBC 8.9  --  9.5  HGB 8.2* 9.1* 8.7*  HCT 25.0* 27.7* 28.4*  MCV 76.7*  --  79.1*  PLT 202  --  185   Cardiac Enzymes: No results for input(s): CKTOTAL, CKMB, CKMBINDEX, TROPONINIHS in the last 72 hours. BNP: No results for input(s): BNP in the last 72 hours. D-Dimer: No results for input(s): DDIMER in the last 72 hours. Hemoglobin A1C: Recent Labs    01/23/24 0430  HGBA1C 5.9*   Fasting Lipid Panel: No results for input(s): CHOL, HDL, LDLCALC, TRIG, CHOLHDL, LDLDIRECT  in the last 72 hours. Thyroid Function Tests: No results for input(s): TSH, T4TOTAL, T3FREE, THYROIDAB in the last 72 hours.  Invalid input(s): FREET3 Anemia Panel: Recent Labs    01/23/24 0323 01/23/24 0430 01/23/24 1008  VITAMINB12  --   --  1,698*  FOLATE  --   --  7.9  FERRITIN 882*  --   --   TIBC 178*  --   --   IRON 29*  --   --   RETICCTPCT  --  2.1  --     DG Chest 1 View Result Date: 01/23/2024 EXAM: 1 VIEW(S) XRAY OF THE CHEST 01/23/2024 10:37:00 PM COMPARISON: 01/22/2024 CLINICAL HISTORY: Lethargy FINDINGS: LINES, TUBES AND DEVICES: Left axillary stent noted. LUNGS AND PLEURA: Increased interstitial markings, lower lung/infrahilar predominant,  favoring mild interstitial edema although multifocal infection/pneumonia is also possible. No pleural effusion. No pneumothorax. HEART AND MEDIASTINUM: Stable cardiomegaly. Aortic atherosclerosis. BONES AND SOFT TISSUES: No acute osseous abnormality. IMPRESSION: 1. Increased lower lung/infrahilar predominant interstitial markings, favoring mild interstitial edema over multifocal infection/pneumonia. Electronically signed by: Pinkie Pebbles MD MD 01/23/2024 10:41 PM EST RP Workstation: [id number]     ECHO 07/2023: 1. Left ventricular ejection fraction, by estimation, is 55 to 60%. The left ventricle has normal function. The left ventricle has no regional wall motion abnormalities. The left ventricular internal cavity size was mildly dilated. There is moderate concentric left ventricular hypertrophy. Left ventricular diastolic parameters are consistent with Grade III diastolic dysfunction  (restrictive).   2. Right ventricular systolic function is normal. The right ventricular  size is normal.   3. Left atrial size was moderately dilated.   4. Right atrial size was mild to moderately dilated.   5. The mitral valve is grossly normal. Trivial mitral valve regurgitation.   6. Tricuspid valve regurgitation is mild to moderate.   7. The aortic valve is grossly normal. Aortic valve regurgitation is  mild. Mild aortic valve stenosis.    TELEMETRY (personally reviewed): Atrial flutter rate 110s  EKG (personally reviewed): Atrial flutter rate 111 bpm, PVCs  DATA reviewed by me 01/25/2024: last 24h vitals tele labs imaging I/O, hospitalist progress note, PCCM notes  Principal Problem:   Chest pain Active Problems:   Chronic atrial fibrillation with RVR (HCC)   Type 2 diabetes mellitus with chronic kidney disease, without long-term current use of insulin  (HCC)   BPH (benign prostatic hyperplasia)   End-stage renal disease on hemodialysis (HCC)   Influenza A    ASSESSMENT AND PLAN: Jeffery Joseph  is a 84 y.o. 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 Cardiology was consulted for further evaluation.   # Chronic atrial flutter # Acute on chronic HFpEF # ESRD on HD # Anemia Patient presented with worsening shortness of breath and chest tightness.  EKG without acute ischemic changes but did demonstrate atrial flutter which he has a known history of.  Hemoglobin was 4.5 on admission and he received transfusion, up to 9.1 this a.m.  Started on IV heparin . - Continue IV heparin , monitor H&H closely. Will consider transitioning to eliquis  if CT abdomen without acute abnormality requiring intervention.   - Suspect troponin secondary to demand ischemia, no plan for further cardiac invasive evaluation at this time. - Continue amiodarone  100 mg daily.  Dose was decreased at outpatient appointment due to borderline  slow heart rate.  Could consider transitioning to IV infusions however rates have remained stable and he appears relatively asymptomatic. - Continue atorvastatin  80 mg daily. - HD as per nephrology, appreciate their recommendations.  This patient's case was discussed and created with Dr. Florencio and he is in agreement.  Signed:  Danita Bloch, PA-C  01/25/2024, 9:49 AM [facility] Cardiology

## 2024-01-25 NOTE — Progress Notes (Signed)
 " [facility]  ROUNDING NOTE   Subjective:  Jeffery Joseph, 84 yo male is known to our practice, outpatient dialysis center at [facility] [address] And is followed by Jeffery Joseph.  Update:   Patient seen and evaluated at bedside this a.m. Underwent dialysis treatment yesterday. Tolerated treatment well.    Objective:  Vital signs in last 24 hours:  Temp:  [97.4 F (36.3 C)-98.7 F (37.1 C)] 98.3 F (36.8 C) (01/13 0400) Pulse Rate:  [105-120] 112 (01/13 0730) Resp:  [0-37] 16 (01/13 0730) BP: (90-131)/(35-85) 109/52 (01/13 0730) SpO2:  [93 %-100 %] 98 % (01/13 0730) Weight:  [99.4 kg] 99.4 kg (01/13 0630)  Weight change: 2.5 kg Filed Weights   01/22/24 1740 01/24/24 0122 01/25/24 0630  Weight: 63.5 kg 96.9 kg 99.4 kg    Intake/Output: I/O last 3 completed shifts: In: 3322 [P.O.:960; I.V.:1162; IV Piggyback:1200] Out: 2300 [Urine:800; Other:1500]   Intake/Output this shift:  No intake/output data recorded.  Physical Exam: General: NAD  Head: Normocephalic  Eyes: Anicteric  Neck: Supple, trachea midline  Lungs:  Clear to auscultation  Heart: Regular rate and rhythm  Abdomen:  Soft, nontender,   Extremities: No peripheral edema.  Neurologic: Alert and awake  Skin: No lesions  Access: Left AVF    Basic Metabolic Panel: Recent Labs  Lab 01/22/24 1745 01/23/24 0323 01/24/24 0317 01/24/24 1554 01/25/24 0330  NA 134* 133* 135 132* 130*  K 3.5 3.4* 3.7 3.6 3.3*  CL 94* 97* 97* 94* 93*  CO2 27 23 25 27 27   GLUCOSE 134* 105* 120* 173* 147*  BUN 46* 47* 52* 31* 35*  CREATININE 4.70* 4.68* 5.05* 3.37* 3.62*  CALCIUM  8.4* 8.0* 8.5* 8.3* 8.3*  MG  --   --  2.0  --  1.9  PHOS  --   --  3.9 2.5 2.7    Liver Function Tests: Recent Labs  Lab 01/22/24 1745 01/24/24 0317 01/24/24 1554 01/25/24 0330  AST 73* 34  --  22  ALT 70* 48*  --  33  ALKPHOS 86 66  --  64  BILITOT 0.5 0.9  --  0.7  PROT 7.6 6.7  --  6.5  ALBUMIN  3.1* 2.7* 2.7* 2.6*   No results  for input(s): LIPASE, AMYLASE in the last 168 hours. No results for input(s): AMMONIA in the last 168 hours.  CBC: Recent Labs  Lab 01/22/24 1745 01/23/24 0430 01/23/24 0826 01/23/24 2200 01/24/24 0317 01/24/24 1005 01/25/24 0330  WBC 11.8* 5.8  --   --  8.9  --  9.5  HGB 8.0* 4.5* 7.6* 7.8* 8.2* 9.1* 8.7*  HCT 25.1* 15.3*  --  24.0* 25.0* 27.7* 28.4*  MCV 76.5* 82.3  --   --  76.7*  --  79.1*  PLT 209 117*  --   --  202  --  185    Cardiac Enzymes: No results for input(s): CKTOTAL, CKMB, CKMBINDEX, TROPONINI in the last 168 hours.  BNP: Invalid input(s): POCBNP  CBG: Recent Labs  Lab 01/24/24 0123 01/24/24 0747 01/24/24 1123 01/24/24 1553 01/24/24 2058  GLUCAP 119* 119* 92 161* 126*    Microbiology: Results for orders placed or performed during the hospital encounter of 01/22/24  Culture, blood (Routine X 2) w Reflex to ID Panel     Status: None (Preliminary result)   Collection Time: 01/23/24 10:33 PM   Specimen: BLOOD RIGHT ARM  Result Value Ref Range Status   Specimen Description BLOOD RIGHT ARM  Final   Special Requests   Final    BOTTLES DRAWN AEROBIC AND ANAEROBIC Blood Culture results may not be optimal due to an inadequate volume of blood received in culture bottles   Culture  Setup Time   Final    GRAM POSITIVE COCCI IN BOTH AEROBIC AND ANAEROBIC BOTTLES CRITICAL RESULT CALLED TO, READ BACK BY AND VERIFIED WITH: KRISTAN MERRILL 01/24/24 1252 MU Performed at [facility], [address] [place], [place] [address]    Culture GRAM POSITIVE COCCI  Final   Report Status PENDING  Incomplete  Resp panel by RT-PCR (RSV, Flu A&B, Covid) Anterior Nasal Swab     Status: None   Collection Time: 01/23/24 10:33 PM   Specimen: Anterior Nasal Swab  Result Value Ref Range Status   SARS Coronavirus 2 by RT PCR NEGATIVE NEGATIVE Final    Comment: (NOTE) SARS-CoV-2 target nucleic acids are NOT DETECTED.  The SARS-CoV-2 RNA is generally  detectable in upper respiratory specimens during the acute phase of infection. The lowest concentration of SARS-CoV-2 viral copies this assay can detect is 138 copies/mL. A negative result does not preclude SARS-Cov-2 infection and should not be used as the sole basis for treatment or other patient management decisions. A negative result may occur with  improper specimen collection/handling, submission of specimen other than nasopharyngeal swab, presence of viral mutation(s) within the areas targeted by this assay, and inadequate number of viral copies(<138 copies/mL). A negative result must be combined with clinical observations, patient history, and epidemiological information. The expected result is Negative.  Fact Sheet for Patients:  bloggercourse.com  Fact Sheet for Healthcare Providers:  seriousbroker.it  This test is no t yet approved or cleared by the [place]  FDA and  has been authorized for detection and/or diagnosis of SARS-CoV-2 by FDA under an Emergency Use Authorization (EUA). This EUA will remain  in effect (meaning this test can be used) for the duration of the COVID-19 declaration under Section 564(b)(1) of the Act, 21 U.S.C.section 360bbb-3(b)(1), unless the authorization is terminated  or revoked sooner.       Influenza A by PCR NEGATIVE NEGATIVE Final   Influenza B by PCR NEGATIVE NEGATIVE Final    Comment: (NOTE) The Xpert Xpress SARS-CoV-2/FLU/RSV plus assay is intended as an aid in the diagnosis of influenza from Nasopharyngeal swab specimens and should not be used as a sole basis for treatment. Nasal washings and aspirates are unacceptable for Xpert Xpress SARS-CoV-2/FLU/RSV testing.  Fact Sheet for Patients: bloggercourse.com  Fact Sheet for Healthcare Providers: seriousbroker.it  This test is not yet approved or cleared by the [place]  FDA  and has been authorized for detection and/or diagnosis of SARS-CoV-2 by FDA under an Emergency Use Authorization (EUA). This EUA will remain in effect (meaning this test can be used) for the duration of the COVID-19 declaration under Section 564(b)(1) of the Act, 21 U.S.C. section 360bbb-3(b)(1), unless the authorization is terminated or revoked.     Resp Syncytial Virus by PCR NEGATIVE NEGATIVE Final    Comment: (NOTE) Fact Sheet for Patients: bloggercourse.com  Fact Sheet for Healthcare Providers: seriousbroker.it  This test is not yet approved or cleared by the [place]  FDA and has been authorized for detection and/or diagnosis of SARS-CoV-2 by FDA under an Emergency Use Authorization (EUA). This EUA will remain in effect (meaning this test can be used) for the duration of the COVID-19 declaration under Section 564(b)(1) of the Act, 21 U.S.C. section 360bbb-3(b)(1), unless the authorization is  terminated or revoked.  Performed at [facility], [address] [place], [place] [address]   Respiratory (~20 pathogens) panel by PCR     Status: None   Collection Time: 01/23/24 10:33 PM   Specimen: Nasopharyngeal Swab; Respiratory  Result Value Ref Range Status   Adenovirus NOT DETECTED NOT DETECTED Final   Coronavirus 229E NOT DETECTED NOT DETECTED Final    Comment: (NOTE) The Coronavirus on the Respiratory Panel, DOES NOT test for the novel  Coronavirus (2019 nCoV)    Coronavirus HKU1 NOT DETECTED NOT DETECTED Final   Coronavirus NL63 NOT DETECTED NOT DETECTED Final   Coronavirus OC43 NOT DETECTED NOT DETECTED Final   Metapneumovirus NOT DETECTED NOT DETECTED Final   Rhinovirus / Enterovirus NOT DETECTED NOT DETECTED Final   Influenza A NOT DETECTED NOT DETECTED Final   Influenza B NOT DETECTED NOT DETECTED Final   Parainfluenza Virus 1 NOT DETECTED NOT DETECTED Final   Parainfluenza Virus 2 NOT DETECTED NOT  DETECTED Final   Parainfluenza Virus 3 NOT DETECTED NOT DETECTED Final   Parainfluenza Virus 4 NOT DETECTED NOT DETECTED Final   Respiratory Syncytial Virus NOT DETECTED NOT DETECTED Final   Bordetella pertussis NOT DETECTED NOT DETECTED Final   Bordetella Parapertussis NOT DETECTED NOT DETECTED Final   Chlamydophila pneumoniae NOT DETECTED NOT DETECTED Final   Mycoplasma pneumoniae NOT DETECTED NOT DETECTED Final    Comment: Performed at [facility], [address], [place], [place] [address]  Culture, blood (Routine X 2) w Reflex to ID Panel     Status: None (Preliminary result)   Collection Time: 01/24/24 12:14 AM   Specimen: BLOOD LEFT HAND  Result Value Ref Range Status   Specimen Description   Final    BLOOD LEFT HAND Performed at [facility], [address] [place], [place] [address]    Special Requests   Final    BOTTLES DRAWN AEROBIC AND ANAEROBIC Blood Culture results may not be optimal due to an inadequate volume of blood received in culture bottles Performed at [facility], [address], [place], [place] [address]    Culture  Setup Time   Final    GRAM POSITIVE COCCI IN BOTH AEROBIC AND ANAEROBIC BOTTLES CRITICAL VALUE NOTED.  VALUE IS CONSISTENT WITH PREVIOUSLY REPORTED AND CALLED VALUE. CRITICAL RESULT CALLED TO, READ BACK BY AND VERIFIED WITH: MAYA CHARLENA NAPOLEON [id number] @ 2318 FH Performed at [facility], [address] [place], [place] [address]    Culture GRAM POSITIVE COCCI  Final   Report Status PENDING  Incomplete  Blood Culture ID Panel (Reflexed)     Status: Abnormal   Collection Time: 01/24/24 12:14 AM  Result Value Ref Range Status   Enterococcus faecalis DETECTED (A) NOT DETECTED Final    Comment: CRITICAL RESULT CALLED TO, READ BACK BY AND VERIFIED WITH: PHARMD ESABRA NAPOLEON [id number] @ 2318 FH    Enterococcus Faecium NOT DETECTED NOT DETECTED Final   Listeria monocytogenes NOT DETECTED NOT DETECTED Final    Staphylococcus species NOT DETECTED NOT DETECTED Final   Staphylococcus aureus (BCID) NOT DETECTED NOT DETECTED Final   Staphylococcus epidermidis NOT DETECTED NOT DETECTED Final   Staphylococcus lugdunensis NOT DETECTED NOT DETECTED Final   Streptococcus species NOT DETECTED NOT DETECTED Final   Streptococcus agalactiae NOT DETECTED NOT DETECTED Final   Streptococcus pneumoniae NOT DETECTED NOT DETECTED Final   Streptococcus pyogenes NOT DETECTED NOT DETECTED Final   A.calcoaceticus-baumannii NOT DETECTED NOT DETECTED  Final   Bacteroides fragilis NOT DETECTED NOT DETECTED Final   Enterobacterales NOT DETECTED NOT DETECTED Final   Enterobacter cloacae complex NOT DETECTED NOT DETECTED Final   Escherichia coli NOT DETECTED NOT DETECTED Final   Klebsiella aerogenes NOT DETECTED NOT DETECTED Final   Klebsiella oxytoca NOT DETECTED NOT DETECTED Final   Klebsiella pneumoniae NOT DETECTED NOT DETECTED Final   Proteus species NOT DETECTED NOT DETECTED Final   Salmonella species NOT DETECTED NOT DETECTED Final   Serratia marcescens NOT DETECTED NOT DETECTED Final   Haemophilus influenzae NOT DETECTED NOT DETECTED Final   Neisseria meningitidis NOT DETECTED NOT DETECTED Final   Pseudomonas aeruginosa NOT DETECTED NOT DETECTED Final   Stenotrophomonas maltophilia NOT DETECTED NOT DETECTED Final   Candida albicans NOT DETECTED NOT DETECTED Final   Candida auris NOT DETECTED NOT DETECTED Final   Candida glabrata NOT DETECTED NOT DETECTED Final   Candida krusei NOT DETECTED NOT DETECTED Final   Candida parapsilosis NOT DETECTED NOT DETECTED Final   Candida tropicalis NOT DETECTED NOT DETECTED Final   Cryptococcus neoformans/gattii NOT DETECTED NOT DETECTED Final   Vancomycin  resistance NOT DETECTED NOT DETECTED Final    Comment: Performed at [facility], [address] [place], [place] [address]  MRSA Next Gen by PCR, Nasal     Status: None   Collection Time: 01/24/24  2:03 AM    Specimen: Nasal Mucosa; Nasal Swab  Result Value Ref Range Status   MRSA by PCR Next Gen NOT DETECTED NOT DETECTED Final    Comment: (NOTE) The GeneXpert MRSA Assay (FDA approved for NASAL specimens only), is one component of a comprehensive MRSA colonization surveillance program. It is not intended to diagnose MRSA infection nor to guide or monitor treatment for MRSA infections. Test performance is not FDA approved in patients less than 69 years old. Performed at [facility], [address] [place], [place] [address]     Coagulation Studies: Recent Labs    01/23/24 0343 01/23/24 1859  LABPROT 24.6* 25.6*  INR 2.1* 2.2*    Urinalysis: No results for input(s): COLORURINE, LABSPEC, PHURINE, GLUCOSEU, HGBUR, BILIRUBINUR, KETONESUR, PROTEINUR, UROBILINOGEN, NITRITE, LEUKOCYTESUR in the last 72 hours.  Invalid input(s): APPERANCEUR    Imaging: DG Chest 1 View Result Date: 01/23/2024 EXAM: 1 VIEW(S) XRAY OF THE CHEST 01/23/2024 10:37:00 PM COMPARISON: 01/22/2024 CLINICAL HISTORY: Lethargy FINDINGS: LINES, TUBES AND DEVICES: Left axillary stent noted. LUNGS AND PLEURA: Increased interstitial markings, lower lung/infrahilar predominant, favoring mild interstitial edema although multifocal infection/pneumonia is also possible. No pleural effusion. No pneumothorax. HEART AND MEDIASTINUM: Stable cardiomegaly. Aortic atherosclerosis. BONES AND SOFT TISSUES: No acute osseous abnormality. IMPRESSION: 1. Increased lower lung/infrahilar predominant interstitial markings, favoring mild interstitial edema over multifocal infection/pneumonia. Electronically signed by: Pinkie Pebbles MD MD 01/23/2024 10:41 PM EST RP Workstation: [id number]     Medications:    ampicillin  (OMNIPEN) IV Stopped (01/25/24 0250)   doxycycline  (VIBRAMYCIN ) IV Stopped (01/25/24 9791)   heparin  1,150 Units/hr (01/25/24 0700)   norepinephrine  (LEVOPHED ) Adult infusion 5 mcg/min  (01/25/24 0742)    amiodarone   100 mg Oral Daily   Chlorhexidine  Gluconate Cloth  6 each Topical Daily   cholecalciferol   5,000 Units Oral Daily   diclofenac  Sodium  2 g Topical QID   docusate sodium   100 mg Oral QHS   donepezil   10 mg Oral QHS   finasteride   5 mg Oral Daily   insulin  aspart  0-15 Units Subcutaneous TID WC   insulin  aspart  0-5 Units Subcutaneous QHS   midodrine   10 mg Oral TID WC   oseltamivir   30 mg Oral Q M,W,F-1800   pantoprazole  (PROTONIX ) IV  40 mg Intravenous Daily   tamsulosin   0.4 mg Oral Daily   acetaminophen  **OR** acetaminophen , ALPRAZolam , lidocaine  (PF), lidocaine -prilocaine , magnesium  hydroxide, morphine  injection, nitroGLYCERIN , ondansetron  **OR** ondansetron  (ZOFRAN ) IV, mouth rinse, pentafluoroprop-tetrafluoroeth, traZODone   Assessment/ Plan:  Jeffery Joseph is a 84 y.o.  male  with ESRD on hemodialysis, hypertension, diabetes mellitus type II, BPH, chronic afib, peripheral arterial disease, and diastolic congestive heart failure who presented to hospital with atypical chest pain found to have hgb 4.5 and elevated troponin.   Outpatient dialysis [facility] [address]/MWF/AVF and is followed by Jeffery Joseph  End stage Renal Disease on hemodialysis  Patient completed dialysis treatment yesterday.  Tolerated treatment well.  Next dialysis treatment scheduled for tomorrow.  Anemia with chronic kidney disease and suspected acute blood loss.  Hgb 4.5, POA. Suspect GI bleed. Fecal occult positive. Patient received one unit of blood.  Hemoglobin currently 8.7.  Latest Reference Range & Units 01/23/24 03:23  Iron 45 - 182 ug/dL 29 (L)  UIBC ug/dL 850  TIBC 749 - 549 ug/dL 821 (L)  Saturation Ratios 17.9 - 39.5 % 16 (L)  Ferritin 24 - 336 ng/mL 882 (H)  (L): Data is abnormally low (H): Data is abnormally high    Hypotension:   Patient still on norepinephrine  this a.m.  Hopefully can be weaned off this a.m.   Diabetes mellitus type II with chronic kidney  disease  Most recent A1C 5.9 01/23/24. SSI managed by primary team  5. Chronic Afib  Patient on amiodarone .   LOS: 2 Akeira Lahm 1/13/20267:47 AM  "

## 2024-01-25 NOTE — Consult Note (Signed)
 Pharmacy Consult Note - Anticoagulation  Pharmacy Consult for heparin  infusion Indication: chest pain/ACS Allergies[1]  PATIENT MEASUREMENTS: Height: 6' 5 (195.6 cm) Weight: 99.4 kg (219 lb 2.2 oz) IBW/kg (Calculated) : 89.1 HEPARIN  DW (KG): 63.5  VITAL SIGNS: Temp: 97.9 F (36.6 C) (01/13 0800) Temp Source: Oral (01/13 0800) BP: 112/56 (01/13 0815) Pulse Rate: 124 (01/13 0815)  Recent Labs    01/23/24 1859 01/23/24 2200 01/25/24 0330 01/25/24 0802  HGB  --    < > 8.7*  --   HCT  --    < > 28.4*  --   PLT  --    < > 185  --   APTT 49*   < >  --  79*  LABPROT 25.6*  --   --   --   INR 2.2*  --   --   --   HEPARINUNFRC >1.10*  --  >1.10*  --   CREATININE  --    < > 3.62*  --    < > = values in this interval not displayed.    Estimated Creatinine Clearance: 19.1 mL/min (A) (by C-G formula based on SCr of 3.62 mg/dL (H)).  PAST MEDICAL HISTORY: Past Medical History:  Diagnosis Date   Anxiety    Arthritis    Depression    Diabetes mellitus without complication (HCC)    End stage renal disease (HCC)    Hypertension    Pneumonia    Renal disorder     ASSESSMENT: 84 y.o. male with PMH CHF, A.fib, and ESRD on HD is presenting with SOB abd chest pain. Troponin elevated at 695 on admission. Patient is on Apixaban  for a.fib per chart review. Pharmacy has been consulted to initiate and manage heparin  intravenous infusion.  Last dose of Eliquis : 1/10 in the AM  Goal(s) of therapy: Heparin  level 0.3 - 0.7 units/mL aPTT 66 - 102 seconds Monitor platelets by anticoagulation protocol: Yes   Baseline anticoagulation labs: Recent Labs    01/23/24 0343 01/23/24 0430 01/23/24 0826 01/23/24 1859 01/23/24 2200 01/24/24 0317 01/24/24 1005 01/24/24 1419 01/24/24 2318 01/25/24 0330 01/25/24 0802  APTT 48*  --   --  49*  --  52*  --  47* 71*  --  79*  INR 2.1*  --   --  2.2*  --   --   --   --   --   --   --   HGB  --  4.5*   < >  --    < > 8.2* 9.1*  --   --  8.7*   --   PLT  --  117*  --   --   --  202  --   --   --  185  --    < > = values in this interval not displayed.   Baseline HL/aPTT, INR pending. May be elevated given DOAC PTA  0112 0317 aPTT 52, subtherapeutic; 800 un/hr 0112 1419 aPTT 47, subtherapeutic; 950 un/hr 0112 2318 aPTT 71, therapeutic x1; 1150 un/hr 0113 0802 aPTT 79, therapeutic x 2; 1150 un/hr  PLAN: --Continue heparin  infusion at 1150 units/hour --Check aPTT and HL tomorrow AM --Daily CBC  Thank you for involving pharmacy in this patient's care.   Marolyn KATHEE Mare 01/25/2024 9:43 AM    [1] No Known Allergies

## 2024-01-25 NOTE — Consult Note (Signed)
 Pharmacy Consult Note - Anticoagulation  Pharmacy Consult for heparin  infusion Indication: chest pain/ACS Allergies[1]  PATIENT MEASUREMENTS: Height: 6' 5 (195.6 cm) Weight:  (bed scale off) IBW/kg (Calculated) : 89.1 HEPARIN  DW (KG): 63.5  VITAL SIGNS: Temp: 97.4 F (36.3 C) (01/13 0000) Temp Source: Axillary (01/13 0000) BP: 107/51 (01/13 0000) Pulse Rate: 111 (01/13 0000)  Recent Labs    01/23/24 1859 01/23/24 2200 01/24/24 0317 01/24/24 1005 01/24/24 1419 01/24/24 1554 01/24/24 2318  HGB  --    < > 8.2* 9.1*  --   --   --   HCT  --    < > 25.0* 27.7*  --   --   --   PLT  --   --  202  --   --   --   --   APTT 49*  --  52*  --    < >  --  71*  LABPROT 25.6*  --   --   --   --   --   --   INR 2.2*  --   --   --   --   --   --   HEPARINUNFRC >1.10*  --   --   --   --   --   --   CREATININE  --   --  5.05*  --   --  3.37*  --    < > = values in this interval not displayed.    Estimated Creatinine Clearance: 14.7 mL/min (A) (by C-G formula based on SCr of 3.37 mg/dL (H)).  PAST MEDICAL HISTORY: Past Medical History:  Diagnosis Date   Anxiety    Arthritis    Depression    Diabetes mellitus without complication (HCC)    End stage renal disease (HCC)    Hypertension    Pneumonia    Renal disorder     ASSESSMENT: 84 y.o. male with PMH CHF, A.fib, and ESRD on HD is presenting with SOB abd chest pain. Troponin elevated at 695 on admission. Patient is on Apixaban  for a.fib per chart review. Pharmacy has been consulted to initiate and manage heparin  intravenous infusion.  Last dose of Eliquis : 1/10 in the AM  Goal(s) of therapy: Heparin  level 0.3 - 0.7 units/mL aPTT 66 - 102 seconds Monitor platelets by anticoagulation protocol: Yes   Baseline anticoagulation labs: Recent Labs    01/22/24 1745 01/22/24 1745 01/23/24 0343 01/23/24 0430 01/23/24 0826 01/23/24 1859 01/23/24 2200 01/24/24 0317 01/24/24 1005 01/24/24 1419 01/24/24 2318  APTT  --    < >  48*  --   --  49*  --  52*  --  47* 71*  INR  --   --  2.1*  --   --  2.2*  --   --   --   --   --   HGB 8.0*  --   --  4.5*   < >  --  7.8* 8.2* 9.1*  --   --   PLT 209  --   --  117*  --   --   --  202  --   --   --    < > = values in this interval not displayed.   Baseline HL/aPTT, INR pending. May be elevated given DOAC PTA  0112 0317 aPTT 52, subtherapeutic; 800 un/hr 0112 1419 aPTT 47, subtherapeutic; 950 un/hr 0112 2318 aPTT 71, therapeutic x1; 1150 u/hr  PLAN: --Continue heparin  infusion at 1150 units/hour --Check  confirmatory aPTT in 8 hours. HL tomorrow AM to assess for correlation --Daily CBC  Thank you for involving pharmacy in this patient's care.   Damien Napoleon, PharmD Clinical Pharmacist 01/25/2024 12:20 AM    [1] No Known Allergies

## 2024-01-25 NOTE — Plan of Care (Signed)
" °  Problem: Coping: Goal: Ability to adjust to condition or change in health will improve Outcome: Progressing   Problem: Health Behavior/Discharge Planning: Goal: Ability to identify and utilize available resources and services will improve Outcome: Progressing   Problem: Nutritional: Goal: Maintenance of adequate nutrition will improve Outcome: Progressing Goal: Progress toward achieving an optimal weight will improve Outcome: Progressing   Problem: Education: Goal: Knowledge of General Education information will improve Description: Including pain rating scale, medication(s)/side effects and non-pharmacologic comfort measures Outcome: Progressing   Problem: Health Behavior/Discharge Planning: Goal: Ability to manage health-related needs will improve Outcome: Progressing   Problem: Clinical Measurements: Goal: Diagnostic test results will improve Outcome: Progressing   "

## 2024-01-26 ENCOUNTER — Inpatient Hospital Stay

## 2024-01-26 DIAGNOSIS — I4891 Unspecified atrial fibrillation: Secondary | ICD-10-CM

## 2024-01-26 DIAGNOSIS — N186 End stage renal disease: Secondary | ICD-10-CM | POA: Diagnosis not present

## 2024-01-26 DIAGNOSIS — E1151 Type 2 diabetes mellitus with diabetic peripheral angiopathy without gangrene: Secondary | ICD-10-CM | POA: Diagnosis not present

## 2024-01-26 DIAGNOSIS — A4181 Sepsis due to Enterococcus: Secondary | ICD-10-CM | POA: Diagnosis not present

## 2024-01-26 DIAGNOSIS — I6350 Cerebral infarction due to unspecified occlusion or stenosis of unspecified cerebral artery: Secondary | ICD-10-CM | POA: Diagnosis not present

## 2024-01-26 DIAGNOSIS — Z7901 Long term (current) use of anticoagulants: Secondary | ICD-10-CM

## 2024-01-26 DIAGNOSIS — R6521 Severe sepsis with septic shock: Secondary | ICD-10-CM | POA: Diagnosis not present

## 2024-01-26 DIAGNOSIS — R7881 Bacteremia: Secondary | ICD-10-CM | POA: Diagnosis not present

## 2024-01-26 DIAGNOSIS — D735 Infarction of spleen: Secondary | ICD-10-CM | POA: Diagnosis not present

## 2024-01-26 DIAGNOSIS — G9340 Encephalopathy, unspecified: Secondary | ICD-10-CM

## 2024-01-26 DIAGNOSIS — B952 Enterococcus as the cause of diseases classified elsewhere: Secondary | ICD-10-CM | POA: Diagnosis not present

## 2024-01-26 DIAGNOSIS — D733 Abscess of spleen: Secondary | ICD-10-CM | POA: Diagnosis not present

## 2024-01-26 DIAGNOSIS — I120 Hypertensive chronic kidney disease with stage 5 chronic kidney disease or end stage renal disease: Secondary | ICD-10-CM

## 2024-01-26 DIAGNOSIS — E1122 Type 2 diabetes mellitus with diabetic chronic kidney disease: Secondary | ICD-10-CM | POA: Diagnosis not present

## 2024-01-26 LAB — CULTURE, BLOOD (ROUTINE X 2)

## 2024-01-26 LAB — GLUCOSE, CAPILLARY
Glucose-Capillary: 122 mg/dL — ABNORMAL HIGH (ref 70–99)
Glucose-Capillary: 115 mg/dL — ABNORMAL HIGH (ref 70–99)
Glucose-Capillary: 130 mg/dL — ABNORMAL HIGH (ref 70–99)
Glucose-Capillary: 132 mg/dL — ABNORMAL HIGH (ref 70–99)

## 2024-01-26 LAB — MAGNESIUM: Magnesium: 1.8 mg/dL (ref 1.7–2.4)

## 2024-01-26 LAB — TYPE AND SCREEN
ABO/RH(D): B POS
Antibody Screen: NEGATIVE
Unit division: 0
Unit division: 0
Unit division: 0

## 2024-01-26 LAB — CBC
HCT: 27.1 % — ABNORMAL LOW (ref 39.0–52.0)
Hemoglobin: 8.6 g/dL — ABNORMAL LOW (ref 13.0–17.0)
MCH: 24.8 pg — ABNORMAL LOW (ref 26.0–34.0)
MCHC: 31.7 g/dL (ref 30.0–36.0)
MCV: 78.1 fL — ABNORMAL LOW (ref 80.0–100.0)
Platelets: 193 K/uL (ref 150–400)
RBC: 3.47 MIL/uL — ABNORMAL LOW (ref 4.22–5.81)
RDW: 18.6 % — ABNORMAL HIGH (ref 11.5–15.5)
WBC: 10.5 K/uL (ref 4.0–10.5)
nRBC: 0 % (ref 0.0–0.2)

## 2024-01-26 LAB — BPAM RBC
Blood Product Expiration Date: 202602012359
Blood Product Expiration Date: 202602012359
Blood Product Expiration Date: 202602012359
ISSUE DATE / TIME: 202601110755
Unit Type and Rh: 5100
Unit Type and Rh: 5100
Unit Type and Rh: 5100

## 2024-01-26 LAB — PREPARE RBC (CROSSMATCH)

## 2024-01-26 LAB — BASIC METABOLIC PANEL WITH GFR
Anion gap: 9 (ref 5–15)
BUN: 39 mg/dL — ABNORMAL HIGH (ref 8–23)
CO2: 26 mmol/L (ref 22–32)
Calcium: 8.3 mg/dL — ABNORMAL LOW (ref 8.9–10.3)
Chloride: 93 mmol/L — ABNORMAL LOW (ref 98–111)
Creatinine, Ser: 3.77 mg/dL — ABNORMAL HIGH (ref 0.61–1.24)
GFR, Estimated: 15 mL/min — ABNORMAL LOW
Glucose, Bld: 137 mg/dL — ABNORMAL HIGH (ref 70–99)
Potassium: 3.5 mmol/L (ref 3.5–5.1)
Sodium: 128 mmol/L — ABNORMAL LOW (ref 135–145)

## 2024-01-26 LAB — PROTIME-INR
INR: 1.6 — ABNORMAL HIGH (ref 0.8–1.2)
Prothrombin Time: 19.8 s — ABNORMAL HIGH (ref 11.4–15.2)

## 2024-01-26 LAB — PHOSPHORUS: Phosphorus: 3.2 mg/dL (ref 2.5–4.6)

## 2024-01-26 MED ORDER — SODIUM CHLORIDE 0.9 % IV SOLN
2.0000 g | Freq: Two times a day (BID) | INTRAVENOUS | Status: DC
  Administered 2024-01-27 – 2024-02-03 (×16): 2 g via INTRAVENOUS
  Filled 2024-01-26 (×17): qty 20

## 2024-01-26 MED ORDER — POTASSIUM CHLORIDE CRYS ER 20 MEQ PO TBCR
20.0000 meq | EXTENDED_RELEASE_TABLET | Freq: Once | ORAL | Status: AC
  Administered 2024-01-26: 20 meq via ORAL
  Filled 2024-01-26: qty 1

## 2024-01-26 MED ORDER — MAGNESIUM SULFATE IN D5W 1-5 GM/100ML-% IV SOLN
1.0000 g | Freq: Once | INTRAVENOUS | Status: AC
  Administered 2024-01-26: 1 g via INTRAVENOUS
  Filled 2024-01-26: qty 100

## 2024-01-26 MED ORDER — FENTANYL CITRATE (PF) 100 MCG/2ML IJ SOLN
INTRAMUSCULAR | Status: AC
  Filled 2024-01-26: qty 2

## 2024-01-26 MED ORDER — MIDAZOLAM HCL 5 MG/5ML IJ SOLN
INTRAMUSCULAR | Status: AC | PRN
  Administered 2024-01-26: .5 mg via INTRAVENOUS

## 2024-01-26 MED ORDER — SODIUM CHLORIDE 0.9 % IV SOLN: INTRAVENOUS | Status: AC

## 2024-01-26 MED ORDER — FENTANYL CITRATE (PF) 50 MCG/ML IJ SOSY
25.0000 ug | PREFILLED_SYRINGE | INTRAMUSCULAR | Status: DC | PRN
  Administered 2024-01-26: 25 ug via INTRAVENOUS
  Filled 2024-01-26: qty 1

## 2024-01-26 MED ORDER — MIDAZOLAM HCL 2 MG/2ML IJ SOLN
INTRAMUSCULAR | Status: AC
  Filled 2024-01-26: qty 2

## 2024-01-26 MED ORDER — FENTANYL CITRATE (PF) 100 MCG/2ML IJ SOLN
INTRAMUSCULAR | Status: AC | PRN
  Administered 2024-01-26: 25 ug via INTRAVENOUS

## 2024-01-26 NOTE — Procedures (Signed)
 Interventional Radiology Procedure Note  Procedure: Placement of a 12D drain into the splenic fluid collection yielding 90 mL brown fluid.  Sample sent for culture.   Complications: None  Estimated Blood Loss: None  Recommendations: - Drain to JP - Do NOT flush - Follow cultures - Remove drain as soon as possible once output is minimal    Signed,  Wilkie LOIS Lent, MD

## 2024-01-26 NOTE — Progress Notes (Signed)
 Patient clinically stable post CT 12 FR drain placement per Dr Karalee, tolerated well. 90 ml of dark burgundy/thick, foul smelling drainage pulled off with insertion, sent to lab. Vitals remained stable with Versed  0.5 mg along with Fentanyl  25 mcg IV for procedure. Post procedure/recovery/CCU 4 report given at bedside to [facility].

## 2024-01-26 NOTE — Consult Note (Signed)
 Stroke Neurology Consultation Note  Consult Requested by: Dr. Isaiah  Reason for Consult: stroke  Consult Date: 01/26/2024   The history was obtained from the daughter and chart.  During history and examination, all items were able to obtain unless otherwise noted.  History of Present Illness:  Jeffery Joseph is a 84 y.o. African American male with PMH of ESRD on hemodialysis, diabetes, hypertension, hyperlipidemia, PAD, A-fib on Eliquis , BPH and dementia admitted for shortness of breath, chest tightness, sweating.  Developed fever and chills, found to have sepsis, septic shock.  CT abdomen pelvis found to have splenic abscess and separate splenic infarction.  Blood culture positive for Enterococcus faecalis.  ID consulted, currently on ampicillin  treatment.  Patient also has presumed flu infection, on Tamiflu .  MRI was done for regulation of encephalopathy found to have left temporal occipital small infarct.  Neurology consulted.  Past Medical History:  Diagnosis Date   Anxiety    Arthritis    Depression    Diabetes mellitus without complication (HCC)    End stage renal disease (HCC)    Hypertension    Pneumonia    Renal disorder     Past Surgical History:  Procedure Laterality Date   A/V FISTULAGRAM Left 06/08/2023   Procedure: A/V Fistulagram;  Surgeon: Jama Cordella MATSU, MD;  Location: [facility];  Service: Cardiovascular;  Laterality: Left;   A/V SHUNT INTERVENTION Left 09/29/2023   Procedure: A/V SHUNT INTERVENTION;  Surgeon: Marea Selinda RAMAN, MD;  Location: [facility];  Service: Cardiovascular;  Laterality: Left;   APPLICATION OF WOUND VAC Right 11/14/2021   Procedure: APPLICATION OF WOUND VAC;  Surgeon: Jama Cordella MATSU, MD;  Location: [facility];  Service: Vascular;  Laterality: Right;   AV FISTULA PLACEMENT Left 11/14/2021   Procedure: ARTERIOVENOUS (AV) FISTULA CREATION ( BRACHIAL CEPHALIC);  Surgeon: Jama Cordella MATSU, MD;  Location: [facility] OR[facility];  Service:  Vascular;  Laterality: Left;    History reviewed. No pertinent family history.  Social History:  reports that he has never smoked. He has never used smokeless tobacco. He reports that he does not currently use drugs. He reports that he does not drink alcohol.  Allergies: Allergies[1]  Medications Ordered Prior to Encounter[2]  Review of Systems: A full ROS was attempted today and was able to be performed.  Systems assessed include - Constitutional, Eyes, HENT, Respiratory, Cardiovascular, Gastrointestinal, Genitourinary, Integument/breast, Hematologic/lymphatic, Musculoskeletal, Neurological, Behavioral/Psych, Endocrine, Allergic/Immunologic - with pertinent responses as per HPI.  Physical Examination: Temp:  [97.8 F (36.6 C)-98.5 F (36.9 C)] 97.9 F (36.6 C) (01/14 1300) Pulse Rate:  [58-117] 114 (01/14 1345) Resp:  [9-26] 15 (01/14 1345) BP: (79-130)/(30-65) 113/42 (01/14 1345) SpO2:  [86 %-100 %] 95 % (01/14 1345) Weight:  [100.2 kg] 100.2 kg (01/14 0500)  General - well nourished, well developed, in no apparent distress.    Ophthalmologic - fundi not visualized due to noncooperation.    Cardiovascular - irregularly irregular heart rate and rhythm.  Neuro - awake, alert, eyes open, orientated to age, place, time and people and situation. No aphasia, paucity of speech, naming 3/4 and able to repeat, following all simple commands. No gaze palsy, tracking bilaterally, visual field full, PERRL. No facial droop. Tongue midline. Bilateral UEs 5/5, no drift but with mild asterixis. Bilaterally LEs 4/5, no drift but with mild asterixis. Sensation symmetrical bilaterally, b/l FTN intact, gait not tested.    Data Reviewed: CT GUIDED PERITONEAL/RETROPERITONEAL FLUID DRAIN BY PERC CATH Result Date: 01/26/2024  INDICATION: 84 year old male with sepsis and new low-attenuation fluid collection versus liquified infarct in the spleen. He presents for CT-guided aspiration with possible drain  placement. EXAM: CT-guided drain placement TECHNIQUE: Multidetector CT imaging of the abdomen was performed following the standard protocol without IV contrast. RADIATION DOSE REDUCTION: This exam was performed according to the departmental dose-optimization program which includes automated exposure control, adjustment of the mA and/or kV according to patient size and/or use of iterative reconstruction technique. MEDICATIONS: The patient is currently admitted to the hospital and receiving intravenous antibiotics. The antibiotics were administered within an appropriate time frame prior to the initiation of the procedure. ANESTHESIA/SEDATION: Moderate (conscious) sedation was employed during this procedure. A total of Versed  0.5 mg and Fentanyl  25 mcg was administered intravenously by the radiology nurse. Total intra-service moderate Sedation Time: 10 minutes. The patient's level of consciousness and vital signs were monitored continuously by radiology nursing throughout the procedure under my direct supervision. COMPLICATIONS: None immediate. PROCEDURE: Informed written consent was obtained from the patient after a thorough discussion of the procedural risks, benefits and alternatives. All questions were addressed. Maximal Sterile Barrier Technique was utilized including caps, mask, sterile gowns, sterile gloves, sterile drape, hand hygiene and skin antiseptic. A timeout was performed prior to the initiation of the procedure. A planning axial CT scan was performed. The low-attenuation region in the spleen was identified. A skin entry site was selected and marked. The skin was sterilely prepped and draped in the standard fashion using chlorhexidine  skin prep. Local anesthesia was attained by infiltration with 1% lidocaine . A small dermatotomy was made. Under intermittent CT guidance, an 18 gauge trocar needle was advanced into the fluid collection. Aspiration yields foul-smelling thin reddish brown fluid. It is  unclear if this represents liquified hematoma or simple abscess. Regardless, the foul small suggests infection and therefore drain placement will be performed. A 0.035 wire was coiled in the collection. The trocar needle was removed. The percutaneous tract was dilated to 12 French. A 12 French all-purpose drainage catheter was advanced over the wire and formed. Aspiration yields 90 mL of the brownish red foul-smelling fluid. Samples were sent for Gram stain and culture. Follow-up CT imaging demonstrates a well-positioned drainage catheter with no evidence of hemorrhage. The pleura remains cephalad to the drain. No evidence of transgression. IMPRESSION: Successful placement of a 12 French drainage catheter into the splenic fluid collection yielding 90 mL of reddish brown foul-smelling fluid. Samples were sent for Gram stain and culture. PLAN: Maintain drain to JP bulb suction. Do not flush. Recommend removal of drainage catheter as soon as possible (once output is scant for 48 hours). Electronically Signed   By: Wilkie Lent M.D.   On: 01/26/2024 12:55   CT Angio Abd/Pel w/ and/or w/o Result Date: 01/26/2024 CLINICAL DATA:  Splenic fluid collection. Possible infarct versus abscess. EXAM: CTA ABDOMEN AND PELVIS WITHOUT AND WITH CONTRAST TECHNIQUE: Multidetector CT imaging of the abdomen and pelvis was performed using the standard protocol during bolus administration of intravenous contrast. Multiplanar reconstructed images and MIPs were obtained and reviewed to evaluate the vascular anatomy. RADIATION DOSE REDUCTION: This exam was performed according to the departmental dose-optimization program which includes automated exposure control, adjustment of the mA and/or kV according to patient size and/or use of iterative reconstruction technique. CONTRAST:  OMNIPAQUE  IOHEXOL  350 MG/ML SOLN COMPARISON:  CT scan without contrast performed yesterday 01/25/2024 FINDINGS: VASCULAR Aorta: Normal caliber aorta  without aneurysm, dissection, vasculitis or significant stenosis. Scattered atherosclerotic vascular calcifications.  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 The left hepatic artery is replaced to the left gastric artery. No aneurysm or dissection. SMA: Patent without evidence of aneurysm, dissection, vasculitis or significant stenosis. Renals: Solitary renal arteries bilaterally. Calcified plaque results in high-grade stenosis of the left renal artery. The right renal artery is patent but relatively small in caliber. No aneurysm or changes of FMD. IMA: Patent without evidence of aneurysm, dissection, vasculitis or significant stenosis. Inflow: Patent without evidence of aneurysm, dissection, vasculitis or significant stenosis. Proximal Outflow: Bilateral common femoral and visualized portions of the superficial and profunda femoral arteries are patent without evidence of aneurysm, dissection, vasculitis or significant stenosis. Veins: Widely patent portal, splenic and visceral veins. Review of the MIP images confirms the above findings. NON-VASCULAR Lower chest: Dependent atelectasis bilaterally. Cardiomegaly. Calcified atherosclerotic plaque along the coronary arteries. Thickening calcified aortic valve. Hepatobiliary: Normal hepatic contour and morphology. No discrete hepatic lesions. Normal appearance of the gallbladder. No intra or extrahepatic biliary ductal dilatation. Pancreas: Unremarkable. No pancreatic ductal dilatation or surrounding inflammatory changes. Spleen: Oblong rounded hypoechoic collection in the subcapsular aspect of the spleen extends from cranial to caudal and measures 12.0 x 4.2 x 4.4 cm. The collection is circumscribed. No evidence of peripheral enhancement or hyperemia. There is a second smaller wedge-shaped focus of ill-defined low attenuation in the anterior lower pole of the spleen. Overall, these findings suggest a  combination of acute and subacute splenic infarcts. Adrenals/Urinary Tract: Normal adrenal glands. 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 Similar findings have been noted on multiple prior examinations. Stomach/Bowel: Stomach is within normal limits. Appendix appears normal. No evidence of bowel wall thickening, distention, or inflammatory changes. Lymphatic: No suspicious lymphadenopathy. Reproductive: Prostatomegaly. Other: No abdominal wall hernia or abnormality. No abdominopelvic ascites. Musculoskeletal: No acute fracture or aggressive appearing lytic or blastic osseous lesion. Lower lumbar degenerative disc disease and facet arthropathy. IMPRESSION: 1. Oblong low-attenuation fluid collection in the subcapsular periphery of the spleen extending from cranial to caudal. There is a second smaller wedge-shaped and less well-defined region of decreased enhancement in the inferior and anterior aspect of the splenic tip. Overall findings are most suggestive of a combination of acute and subacute splenic infarct. No peripheral enhancement, hyperemia or internal gas to suggest the presence of infection. 2. 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 3. 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 4. Extensive cystic changes of both kidneys with cysts of variable density and complexity as seen on prior imaging. As before, outpatient MRI with gadolinium contrast could be considered to exclude neoplasm if clinically warranted. 5. Additional ancillary findings as above. Electronically Signed   By: Wilkie Lent M.D.   On: 01/26/2024 08:46   MR BRAIN WO CONTRAST Result Date: 01/25/2024 EXAM: MRI BRAIN WITHOUT CONTRAST 01/25/2024 02:21:13 PM TECHNIQUE: Multiplanar multisequence MRI of the head/brain was performed without the administration of intravenous contrast. COMPARISON: None available. CLINICAL HISTORY: Mental status  change, unknown cause. The patient presents with a mental status change of unknown cause. 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 T2 hyperintensities in the cerebral white matter bilaterally are nonspecific but compatible with mild chronic small vessel ischemic disease. There is mild to moderate cerebral atrophy. No intracranial hemorrhage, mass, midline shift, hydrocephalus, or extra-axial fluid collection is identified. Major intracranial vascular flow voids  are preserved. ORBITS: No significant abnormality. SINUSES AND MASTOIDS: No significant abnormality. BONES AND SOFT TISSUES: Normal marrow signal. No significant soft tissue abnormality. IMPRESSION: 1. 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 2. Mild chronic small vessel ischemic disease. Electronically signed by: Dasie Hamburg MD 01/25/2024 04:40 PM EST RP Workstation: [id number]   ECHOCARDIOGRAM COMPLETE Result Date: 01/25/2024    ECHOCARDIOGRAM REPORT   Patient Name:   Jeffery Joseph Date of Exam: 01/25/2024 Medical Rec #:  [id number]     Height:       77.0 in Accession #:    [id number]    Weight:       219.1 lb Date of Birth:  27-Aug-1940     BSA:          2.325 m Patient Age:    83 years      BP:           111/47 mmHg Patient Gender: M             HR:           113 bpm. Exam Location:  [facility] Procedure: 2D Echo, Cardiac Doppler, Color Doppler, 3D Echo and Strain Analysis            (Both Spectral and Color Flow Doppler were utilized during            procedure). Indications:     Shock R57.9  History:         Patient has prior history of Echocardiogram examinations, most                  recent 07/18/2023. Risk Factors:Diabetes and Hypertension.  Sonographer:     Christopher Furnace Referring Phys:  [id number] LONELL KANDICE MOOSE Diagnosing Phys: Cara JONETTA Lovelace MD  Sonographer Comments: Global longitudinal strain was attempted. IMPRESSIONS  1. Left ventricular ejection fraction, by  estimation, is 45 to 50%. The left ventricle has mildly decreased function. The left ventricle demonstrates global hypokinesis. The left ventricular internal cavity size was mildly dilated. There is mild asymmetric left ventricular hypertrophy. Left ventricular diastolic parameters are consistent with Grade II diastolic dysfunction (pseudonormalization). The average left ventricular global longitudinal strain is 6.7 %. The global longitudinal strain is abnormal.  2. Right ventricular systolic function is low normal. The right ventricular size is mildly enlarged.  3. Left atrial size was mildly dilated.  4. The mitral valve is grossly normal. Mild to moderate mitral valve regurgitation.  5. Tricuspid valve regurgitation is mild to moderate.  6. The aortic valve is calcified. Aortic valve regurgitation is mild. Mild aortic valve stenosis. FINDINGS  Left Ventricle: Left ventricular ejection fraction, by estimation, is 45 to 50%. The left ventricle has mildly decreased function. The left ventricle demonstrates global hypokinesis. The average left ventricular global longitudinal strain is 6.7 %. Strain was performed and the global longitudinal strain is abnormal. The left ventricular internal cavity size was mildly dilated. There is mild asymmetric left ventricular hypertrophy. Left ventricular diastolic parameters are consistent with Grade II diastolic dysfunction (pseudonormalization). Right Ventricle: The right ventricular size is mildly enlarged. No increase in right ventricular wall thickness. Right ventricular systolic function is low normal. Left Atrium: Left atrial size was mildly dilated. Right Atrium: Right atrial size was normal in size. Pericardium: There is no evidence of pericardial effusion. Mitral Valve: The mitral valve is grossly normal. Mild to moderate mitral valve regurgitation. MV peak gradient, 12.4 mmHg. The mean mitral valve gradient is 6.0 mmHg. Tricuspid  Valve: The tricuspid valve is normal in  structure. Tricuspid valve regurgitation is mild to moderate. Aortic Valve: The aortic valve is calcified. Aortic valve regurgitation is mild. Aortic regurgitation PHT measures 206 msec. Mild aortic stenosis is present. Aortic valve mean gradient measures 16.2 mmHg. Aortic valve peak gradient measures 30.7 mmHg. Aortic valve area, by VTI measures 1.51 cm. Pulmonic Valve: The pulmonic valve was not well visualized. Pulmonic valve regurgitation is not visualized. Aorta: The aortic root was not well visualized. IAS/Shunts: No atrial level shunt detected by color flow Doppler. Additional Comments: 3D was performed not requiring image post processing on an independent workstation and was abnormal.  LEFT VENTRICLE PLAX 2D LVIDd:         5.20 cm      Diastology LVIDs:         3.90 cm      LV e' medial:    3.59 cm/s LV PW:         1.10 cm      LV E/e' medial:  39.0 LV IVS:        1.40 cm      LV e' lateral:   8.27 cm/s LVOT diam:     2.00 cm      LV E/e' lateral: 16.9 LV SV:         62 LV SV Index:   26           2D Longitudinal Strain LVOT Area:     3.14 cm     2D Strain GLS (A4C):   5.9 % LV IVRT:       48 msec      2D Strain GLS (A3C):   6.1 %                             2D Strain GLS (A2C):   8.2 %                             2D Strain GLS Avg:     6.7 % LV Volumes (MOD) LV vol d, MOD A2C: 147.0 ml LV vol d, MOD A4C: 112.0 ml LV vol s, MOD A2C: 83.5 ml LV vol s, MOD A4C: 72.4 ml LV SV MOD A2C:     63.5 ml LV SV MOD A4C:     112.0 ml LV SV MOD BP:      47.7 ml RIGHT VENTRICLE RV Basal diam:  4.00 cm RV Mid diam:    3.50 cm RV S prime:     11.90 cm/s TAPSE (M-mode): 1.8 cm LEFT ATRIUM             Index        RIGHT ATRIUM           Index LA diam:        4.00 cm 1.72 cm/m   RA Area:     15.20 cm LA Vol (A2C):   97.8 ml 42.06 ml/m  RA Volume:   31.10 ml  13.37 ml/m LA Vol (A4C):   91.0 ml 39.13 ml/m LA Biplane Vol: 96.0 ml 41.28 ml/m  AORTIC VALVE AV Area (Vmax):    1.27 cm AV Area (Vmean):   1.28 cm AV Area  (VTI):     1.51 cm AV Vmax:           277.25 cm/s AV Vmean:  183.000 cm/s AV VTI:            0.409 m AV Peak Grad:      30.7 mmHg AV Mean Grad:      16.2 mmHg LVOT Vmax:         112.00 cm/s LVOT Vmean:        74.500 cm/s LVOT VTI:          0.196 m LVOT/AV VTI ratio: 0.48 AI PHT:            206 msec  AORTA Ao Root diam: 2.70 cm MITRAL VALVE                  TRICUSPID VALVE MV Area (PHT): 5.31 cm       TR Peak grad:   26.4 mmHg MV Area VTI:   2.15 cm       TR Vmax:        257.00 cm/s MV Peak grad:  12.4 mmHg MV Mean grad:  6.0 mmHg       SHUNTS MV Vmax:       1.76 m/s       Systemic VTI:  0.20 m MV Vmean:      119.0 cm/s     Systemic Diam: 2.00 cm MV Decel Time: 143 msec MR Peak grad:    108.6 mmHg MR Mean grad:    74.0 mmHg MR Vmax:         521.00 cm/s MR Vmean:        418.0 cm/s MR PISA:         4.02 cm MR PISA Eff ROA: 25 mm MR PISA Radius:  0.80 cm MV E velocity: 140.00 cm/s MV A velocity: 114.00 cm/s MV E/A ratio:  1.23 Dwayne D Callwood MD Electronically signed by Cara JONETTA Lovelace MD Signature Date/Time: 01/25/2024/2:49:15 PM    Final    CT ABDOMEN PELVIS WO CONTRAST Result Date: 01/25/2024 EXAM: CT ABDOMEN AND PELVIS WITHOUT CONTRAST 01/25/2024 11:06:03 AM TECHNIQUE: CT of the abdomen and pelvis was performed without the administration of intravenous contrast. Multiplanar reformatted images are provided for review. Automated exposure control, iterative reconstruction, and/or weight-based adjustment of the mA/kV was utilized to reduce the radiation dose to as low as reasonably achievable. COMPARISON: 09/07/2023 CLINICAL HISTORY: Sepsis. FINDINGS: LOWER CHEST: Coronary and aortic atherosclerosis with moderate cardiomegaly. Trace bilateral pleural effusions with mild atelectasis in the lung bases. Hazy airspace opacity medially in the left lower lobe on image 9 series 4 is nonspecific, early bronchopneumonia not excluded. LIVER: The liver is unremarkable. GALLBLADDER AND BILE DUCTS: Contracted  gallbladder. No biliary ductal dilatation. SPLEEN: Abnormal hypodense lesions in the spleen measuring up to 10.5 x 4.5 x 4.0 cm (volume = 99 cm\S\3), not present on 09/07/2023. Splenic abscess is not excluded. PANCREAS: No acute abnormality. ADRENAL GLANDS: No acute abnormality. KIDNEYS, URETERS AND BLADDER: Scattered hypodense and hyperdense renal lesions similar to previous, suspicious for polycystic kidney disease. Enhancement not assessed on today's noncontrast examination. Overall morphology similar to the 09/07/2023 exam. No stones in the kidneys or ureters. No hydronephrosis. No perinephric or periureteral stranding. Urinary bladder is unremarkable. GI AND BOWEL: Stomach demonstrates no acute abnormality. Stable dilated appendix at 9 mm diameter on image 73 series 2 with internal high density suspicious for appendicolith, similar appearance on 09/07/2023. Given the chronic prominence, an underlying appendiceal mass cannot be readily excluded. No current periappendiceal inflammatory findings. There is no bowel obstruction. PERITONEUM AND RETROPERITONEUM: No ascites. No free air. New  nonspecific presacral edema. VASCULATURE: Aorta is normal in caliber. Atherosclerosis atheromatous plaque at the origin of the superior mesenteric artery. LYMPH NODES: No lymphadenopathy. REPRODUCTIVE ORGANS: Stable prostatomegaly. BONES AND SOFT TISSUES: Low level subcutaneous edema along the flanks. Bridging spurring of both sacroiliac joints. 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 Grade 1 degenerative anterolisthesis at L4-L5 with lower lumbar spondylosis and degenerative disc disease and suspected bilateral foraminal stenosis at L3-L4 and L4-L5 with borderline bilateral foraminal stenosis at L5-S1. A small umbilical hernia contains adipose tissue. IMPRESSION: 1. Abnormal hypodense lesions in the spleen measuring up to 10.5 x 4.5 x 4.0 cm (volume = 99 cm\S\3), not present on prior exam, raising concern  for splenic abscesses or hematomas. 2. Stable dilated appendix at 9 mm diameter with internal high density suspicious for appendicolith. Given the chronic prominence, an underlying appendiceal mass cannot be readily excluded. 3. Hazy airspace opacity medially in the left lower lobe, nonspecific, with early bronchopneumonia not excluded. 4. Scattered hypodense and hyperdense renal lesions are suspicious for polycystic kidney disease. 5. New nonspecific presacral edema. 6. 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 7. Grade 1 degenerative anterolisthesis at L4-5 with lower lumbar spondylosis and degenerative disc disease and suspected bilateral foraminal stenosis at L3-4 and L4-5 with borderline bilateral foraminal stenosis at L5-S1. 8. Coronary and aortic atherosclerosis with moderate cardiomegaly. 9. Trace bilateral pleural effusions with mild atelectasis in the lung bases. 10. Stable prostatomegaly. Impression #1 will be telephoned to the referring provider or the referring provider's representative by professional radiology assistant [facility]) personnel, with communication documented in the clario dashboard. Electronically signed by: Ryan Salvage MD MD 01/25/2024 11:37 AM EST RP Workstation: [id number]   DG Chest 1 View Result Date: 01/23/2024 EXAM: 1 VIEW(S) XRAY OF THE CHEST 01/23/2024 10:37:00 PM COMPARISON: 01/22/2024 CLINICAL HISTORY: Lethargy FINDINGS: LINES, TUBES AND DEVICES: Left axillary stent noted. LUNGS AND PLEURA: Increased interstitial markings, lower lung/infrahilar predominant, favoring mild interstitial edema although multifocal infection/pneumonia is also possible. No pleural effusion. No pneumothorax. HEART AND MEDIASTINUM: Stable cardiomegaly. Aortic atherosclerosis. BONES AND SOFT TISSUES: No acute osseous abnormality. IMPRESSION: 1. Increased lower lung/infrahilar predominant interstitial markings, favoring mild interstitial edema over multifocal infection/pneumonia.  Electronically signed by: Pinkie Pebbles MD MD 01/23/2024 10:41 PM EST RP Workstation: [id number]   CT Chest Wo Contrast Result Date: 01/22/2024 CLINICAL DATA:  Respiratory illness, nondiagnostic xray, chest pressure, short of breath EXAM: CT CHEST WITHOUT CONTRAST TECHNIQUE: Multidetector CT imaging of the chest was performed following the standard protocol without IV contrast. RADIATION DOSE REDUCTION: This exam was performed according to the departmental dose-optimization program which includes automated exposure control, adjustment of the mA and/or kV according to patient size and/or use of iterative reconstruction technique. COMPARISON:  01/22/2024, 09/07/2023 FINDINGS: Cardiovascular: Unenhanced imaging of the heart demonstrates cardiomegaly without pericardial effusion. Normal caliber of the thoracic aorta. Atherosclerosis of the aorta and coronary vasculature. Assessment of the vascular lumen cannot be performed without intravenous contrast. Mediastinum/Nodes: No enlarged mediastinal or axillary lymph nodes. Thyroid gland, trachea, and esophagus demonstrate no significant findings. Lungs/Pleura: No acute airspace disease, effusion, or pneumothorax. Dependent hypoventilatory changes are noted. Central airways are patent. Upper Abdomen: Since the prior abdominal CT 09/07/2023, there has been development of borderline splenomegaly measuring 12.7 x 11.9 x 6.8 cm. Within the superolateral aspect of the spleen there is a new indeterminate 6.1 x 6.8 x 4.6 cm hypodensity. Musculoskeletal: No acute or destructive bony abnormalities. Reconstructed images demonstrate no additional findings. IMPRESSION: 1. Cardiomegaly.  No pericardial effusion.  2. No acute airspace disease. 3. Borderline splenomegaly and indeterminate 6.8 cm splenic hypodensity, which have developed in the interim since the 09/07/2023 CT. This is incompletely characterized on this exam, and if further evaluation is clinically indicated a  contrasted CT or MRI of the abdomen could be considered. 4. Aortic Atherosclerosis (ICD10-I70.0). Coronary artery atherosclerosis. Electronically Signed   By: Ozell Daring M.D.   On: 01/22/2024 20:00   DG Chest 2 View Result Date: 01/22/2024 CLINICAL DATA:  Chest pain and shortness of breath. EXAM: CHEST - 2 VIEW COMPARISON:  07/19/2023. FINDINGS: The heart is enlarged and the mediastinal contour is within normal limits. Atherosclerotic calcification of the aorta is noted. Mild atelectasis or scarring is present at the left lung base. No consolidation, effusion, or pneumothorax is seen. No acute osseous abnormality. A vascular stent is noted in the axillary region on the left. IMPRESSION: No active cardiopulmonary disease. Electronically Signed   By: Leita Birmingham M.D.   On: 01/22/2024 18:17   VAS US  DUPLEX DIALYSIS ACCESS (AVF, AVG) Result Date: 01/19/2024 DIALYSIS ACCESS Patient Name:  Jeffery Joseph  Date of Exam:   01/18/2024 Medical Rec #: [id number]      Accession #:    [id number] Date of Birth: 1940-06-03      Patient Gender: M Patient Age:   42 years Exam Location:  [facility] Procedure:      VAS US  DUPLEX DIALYSIS ACCESS (AVF, AVG) Referring Phys: SELINDA GU --------------------------------------------------------------------------------  Reason for Exam: Routine follow up. Access Site: Left Upper Extremity. Access Type: Brachial-cephalic AVF. History: 2023: Left brachial-cephalic AVF created;          06/08/23: Stent placed at cephalic / subclavian confluence;          09/2023: mid upper arm PTA and stent;. Performing Technologist: Elsie Churn RT, RDMS, RVT  Examination Guidelines: A complete evaluation includes B-mode imaging, spectral Doppler, color Doppler, and power Doppler as needed of all accessible portions of each vessel. Unilateral testing is considered an integral part of a complete examination. Limited examinations for reoccurring indications may be performed as noted.  Findings:  +--------------------+----------+-----------------+--------+ AVF                 PSV (cm/s)Flow Vol (mL/min)Comments +--------------------+----------+-----------------+--------+ Native artery inflow   196          1496                +--------------------+----------+-----------------+--------+ AVF Anastomosis        418                              +--------------------+----------+-----------------+--------+  +---------------+----------+-------------+----------+-----------------------+ OUTFLOW VEIN   PSV (cm/s)Diameter (cm)Depth (cm)       Describe         +---------------+----------+-------------+----------+-----------------------+ Subclavian vein   194                                                   +---------------+----------+-------------+----------+-----------------------+ Confluence        129                                    stent          +---------------+----------+-------------+----------+-----------------------+ Clavicle  139                                    stent          +---------------+----------+-------------+----------+-----------------------+ Shoulder          152                                    stent          +---------------+----------+-------------+----------+-----------------------+ Prox UA           133                                    stent          +---------------+----------+-------------+----------+-----------------------+ Mid UA            150                                    stent          +---------------+----------+-------------+----------+-----------------------+ Dist UA           325                           possible retained valve +---------------+----------+-------------+----------+-----------------------+ AC Fossa          119                                                   +---------------+----------+-------------+----------+-----------------------+  Summary: Patent left brachial-cephalic  AVF with no focal hemodynamically significant velocity increases or internal vessel narrowing. Normal Flow Volume noted.  *See table(s) above for measurements and observations.  Diagnosing physician: Selinda Gu MD Electronically signed by Selinda Gu MD on 01/19/2024 at 7:24:41 AM.  --------------------------------------------------------------------------------   Final     Assessment: 84 y.o. male with PMH of ESRD on hemodialysis, diabetes, hypertension, hyperlipidemia, PAD, A-fib on Eliquis , BPH and dementia admitted for sepsis, septic shock, splenic abscess and separate splenic infarction with Enterococcus faecalis bacteremia.  Currently on ampicillin  treatment.  MRI was done for regulation of encephalopathy found to have left temporal occipital small infarct.  Neurology consulted.  Etiology for patient's stroke not quite clear, TEE pending to evaluate endocarditis.  However patient has single small stroke, not consistent with endocarditis and related stroke.  Patient does have A-fib but was on home Eliquis .  DDx including stroke in the setting of septic shock.  Will continue further stroke workup including MRI to rule out mycotic aneurysm, carotid Doppler.  If mycotic aneurysm is ruled out, may consider starting heparin  IV.  Continue current management including amlodipine  infusion, Aricept , midodrine  with Levophed .  Plan: - TEE pending to evaluate endocarditis - MRA and carotid Doppler pending - Once mycotic aneurysm ruled out, may consider starting heparin  IV - Continue home Aricept  for dementia - Continue midodrine , wean off Levophed  as able - Will follow  Thank you for this consultation and allowing us  to participate in the care of this patient.  Ary Cummins, MD PhD Stroke Neurology 01/26/2024 10:29 PM  This patient is critically ill due to sepsis, septic shock, bacteremia, stroke and at significant risk of neurological worsening, death form severe sepsis, endocarditis, recurrent stroke,  hemorrhagic transmission. This patient's care requires constant monitoring of vital signs, hemodynamics, respiratory and cardiac monitoring, review of multiple databases, neurological assessment, discussion with family, other specialists and medical decision making of high complexity. I spent 35 minutes of neurocritical care time in the care of this patient. I had long discussion with daughter at bedside, updated pt current condition, treatment plan and potential prognosis, and answered all the questions.  She expressed understanding and appreciation.         [1] No Known Allergies [2]  No current facility-administered medications on file prior to encounter.   Current Outpatient Medications on File Prior to Encounter  Medication Sig Dispense Refill   acetaminophen  (TYLENOL ) 325 MG tablet Take 650 mg by mouth every 6 (six) hours as needed.     ALPRAZolam  (XANAX ) 0.5 MG tablet Take 0.5 mg by mouth 3 (three) times daily as needed for sleep.     amiodarone  (PACERONE ) 200 MG tablet Take 2 tablets (400 mg total) by mouth 2 (two) times daily for 7 days, THEN 1 tablet (200 mg total) daily. 58 tablet 0   apixaban  (ELIQUIS ) 2.5 MG TABS tablet Take 1 tablet (2.5 mg total) by mouth 2 (two) times daily. 60 tablet 0   Cholecalciferol  125 MCG (5000 UT) TABS Take 5,000 Units by mouth daily.     clopidogrel  (PLAVIX ) 75 MG tablet Take 1 tablet (75 mg total) by mouth daily. 30 tablet 11   diclofenac  Sodium (VOLTAREN ) 1 % GEL Apply 2 g topically 4 (four) times daily. 100 g 0   Docusate Sodium  (DSS) 100 MG CAPS Take 100 mg by mouth at bedtime.     donepezil  (ARICEPT ) 10 MG tablet Take 10 mg by mouth at bedtime.     finasteride  (PROSCAR ) 5 MG tablet Take 1 tablet (5 mg total) by mouth daily. 90 tablet 3   lidocaine -prilocaine  (EMLA ) cream Apply 1 Application topically as needed (Dialysis).     metoprolol  succinate (TOPROL -XL) 25 MG 24 hr tablet Take 0.5 tablets (12.5 mg total) by mouth daily. 30 tablet 0    tamsulosin  (FLOMAX ) 0.4 MG CAPS capsule Take 1 capsule (0.4 mg total) by mouth daily. 90 capsule 3   enalapril  (VASOTEC ) 20 MG tablet Take 20 mg by mouth 2 (two) times daily. (Patient not taking: Reported on 01/20/2024)     furosemide  (LASIX ) 40 MG tablet Take 1 tablet (40 mg total) by mouth every other day. (Patient not taking: Reported on 01/20/2024) 30 tablet 0   glipiZIDE  (GLUCOTROL  XL) 5 MG 24 hr tablet Take by mouth. (Patient not taking: Reported on 01/20/2024)     LOKELMA 10 g PACK packet Take 1 packet by mouth daily. (Patient not taking: Reported on 01/20/2024)     sodium bicarbonate  650 MG tablet Take 650 mg by mouth 2 (two) times daily. (Patient not taking: Reported on 01/20/2024)

## 2024-01-26 NOTE — Progress Notes (Signed)
 " [facility]  ROUNDING NOTE   Subjective:  Mr. Alfieri, 84 yo male is known to our practice, outpatient dialysis center at [address] [facility] [address] And is followed by Dr. Marcelino.  Update:   Patient appears to have possible splenic abscess at the moment. Due for dialysis treatment today as well.  Currently on pressors.    Objective:  Vital signs in last 24 hours:  Temp:  [97.8 F (36.6 C)-98.5 F (36.9 C)] 98.5 F (36.9 C) (01/14 0400) Pulse Rate:  [41-124] 114 (01/14 0645) Resp:  [9-30] 16 (01/14 0645) BP: (76-130)/(30-65) 95/38 (01/14 0645) SpO2:  [88 %-100 %] 97 % (01/14 0645) Weight:  [100.2 kg] 100.2 kg (01/14 0500)  Weight change: 0.8 kg Filed Weights   01/24/24 0122 01/25/24 0630 01/26/24 0500  Weight: 96.9 kg 99.4 kg 100.2 kg    Intake/Output: I/O last 3 completed shifts: In: 1934.9 [P.O.:240; I.V.:1144.9; IV Piggyback:550] Out: 900 [Urine:900]   Intake/Output this shift:  No intake/output data recorded.  Physical Exam: General: NAD  Head: Normocephalic  Eyes: Anicteric  Neck: Supple, trachea midline  Lungs:  Clear to auscultation  Heart: Regular rate and rhythm  Abdomen:  Soft, nontender,   Extremities: No peripheral edema.  Neurologic: Alert and awake  Skin: No lesions  Access: Left AVF    Basic Metabolic Panel: Recent Labs  Lab 01/23/24 0323 01/24/24 0317 01/24/24 1554 01/25/24 0330 01/26/24 0327  NA 133* 135 132* 130* 128*  K 3.4* 3.7 3.6 3.3* 3.5  CL 97* 97* 94* 93* 93*  CO2 23 25 27 27 26   GLUCOSE 105* 120* 173* 147* 137*  BUN 47* 52* 31* 35* 39*  CREATININE 4.68* 5.05* 3.37* 3.62* 3.77*  CALCIUM  8.0* 8.5* 8.3* 8.3* 8.3*  MG  --  2.0  --  1.9 1.8  PHOS  --  3.9 2.5 2.7 3.2    Liver Function Tests: Recent Labs  Lab 01/22/24 1745 01/24/24 0317 01/24/24 1554 01/25/24 0330  AST 73* 34  --  22  ALT 70* 48*  --  33  ALKPHOS 86 66  --  64  BILITOT 0.5 0.9  --  0.7  PROT 7.6 6.7  --  6.5  ALBUMIN  3.1* 2.7* 2.7* 2.6*   No  results for input(s): LIPASE, AMYLASE in the last 168 hours. No results for input(s): AMMONIA in the last 168 hours.  CBC: Recent Labs  Lab 01/23/24 0430 01/23/24 0826 01/24/24 0317 01/24/24 1005 01/25/24 0330 01/25/24 1309 01/26/24 0327  WBC 5.8  --  8.9  --  9.5 11.3* 10.5  NEUTROABS  --   --   --   --   --  8.8*  --   HGB 4.5*   < > 8.2* 9.1* 8.7* 9.1* 8.6*  HCT 15.3*   < > 25.0* 27.7* 28.4* 28.6* 27.1*  MCV 82.3  --  76.7*  --  79.1* 77.3* 78.1*  PLT 117*  --  202  --  185 202 193   < > = values in this interval not displayed.    Cardiac Enzymes: No results for input(s): CKTOTAL, CKMB, CKMBINDEX, TROPONINI in the last 168 hours.  BNP: Invalid input(s): POCBNP  CBG: Recent Labs  Lab 01/24/24 2058 01/25/24 0749 01/25/24 1155 01/25/24 1602 01/25/24 2147  GLUCAP 126* 137* 136* 87 129*    Microbiology: Results for orders placed or performed during the hospital encounter of 01/22/24  Culture, blood (Routine X 2) w Reflex to ID Panel  Status: Abnormal (Preliminary result)   Collection Time: 01/23/24 10:33 PM   Specimen: BLOOD RIGHT ARM  Result Value Ref Range Status   Specimen Description   Final    BLOOD RIGHT ARM Performed at [facility], [address], [place], [place] [address]    Special Requests   Final    BOTTLES DRAWN AEROBIC AND ANAEROBIC Blood Culture results may not be optimal due to an inadequate volume of blood received in culture bottles Performed at [facility], [address], [place], [place] [address]    Culture  Setup Time   Final    GRAM POSITIVE COCCI IN BOTH AEROBIC AND ANAEROBIC BOTTLES CRITICAL RESULT CALLED TO, READ BACK BY AND VERIFIED WITH: KRISTAN MERRILL 01/24/24 1252 MU Performed at [facility], [address], [place], [place] [address]    Culture ENTEROCOCCUS FAECALIS (A)  Final   Report Status PENDING  Incomplete  Resp panel by RT-PCR (RSV, Flu A&B, Covid) Anterior Nasal Swab      Status: None   Collection Time: 01/23/24 10:33 PM   Specimen: Anterior Nasal Swab  Result Value Ref Range Status   SARS Coronavirus 2 by RT PCR NEGATIVE NEGATIVE Final    Comment: (NOTE) SARS-CoV-2 target nucleic acids are NOT DETECTED.  The SARS-CoV-2 RNA is generally detectable in upper respiratory specimens during the acute phase of infection. The lowest concentration of SARS-CoV-2 viral copies this assay can detect is 138 copies/mL. A negative result does not preclude SARS-Cov-2 infection and should not be used as the sole basis for treatment or other patient management decisions. A negative result may occur with  improper specimen collection/handling, submission of specimen other than nasopharyngeal swab, presence of viral mutation(s) within the areas targeted by this assay, and inadequate number of viral copies(<138 copies/mL). A negative result must be combined with clinical observations, patient history, and epidemiological information. The expected result is Negative.  Fact Sheet for Patients:  bloggercourse.com  Fact Sheet for Healthcare Providers:  seriousbroker.it  This test is no t yet approved or cleared by the [place]  FDA and  has been authorized for detection and/or diagnosis of SARS-CoV-2 by FDA under an Emergency Use Authorization (EUA). This EUA will remain  in effect (meaning this test can be used) for the duration of the COVID-19 declaration under Section 564(b)(1) of the Act, 21 U.S.C.section 360bbb-3(b)(1), unless the authorization is terminated  or revoked sooner.       Influenza A by PCR NEGATIVE NEGATIVE Final   Influenza B by PCR NEGATIVE NEGATIVE Final    Comment: (NOTE) The Xpert Xpress SARS-CoV-2/FLU/RSV plus assay is intended as an aid in the diagnosis of influenza from Nasopharyngeal swab specimens and should not be used as a sole basis for treatment. Nasal washings and aspirates are  unacceptable for Xpert Xpress SARS-CoV-2/FLU/RSV testing.  Fact Sheet for Patients: bloggercourse.com  Fact Sheet for Healthcare Providers: seriousbroker.it  This test is not yet approved or cleared by the [place]  FDA and has been authorized for detection and/or diagnosis of SARS-CoV-2 by FDA under an Emergency Use Authorization (EUA). This EUA will remain in effect (meaning this test can be used) for the duration of the COVID-19 declaration under Section 564(b)(1) of the Act, 21 U.S.C. section 360bbb-3(b)(1), unless the authorization is terminated or revoked.     Resp Syncytial Virus by PCR NEGATIVE NEGATIVE Final    Comment: (NOTE) Fact Sheet for Patients: bloggercourse.com  Fact Sheet for Healthcare Providers: seriousbroker.it  This test is  not yet approved or cleared by the [place]  FDA and has been authorized for detection and/or diagnosis of SARS-CoV-2 by FDA under an Emergency Use Authorization (EUA). This EUA will remain in effect (meaning this test can be used) for the duration of the COVID-19 declaration under Section 564(b)(1) of the Act, 21 U.S.C. section 360bbb-3(b)(1), unless the authorization is terminated or revoked.  Performed at [facility], [address] [place], [place] [address]   Respiratory (~20 pathogens) panel by PCR     Status: None   Collection Time: 01/23/24 10:33 PM   Specimen: Nasopharyngeal Swab; Respiratory  Result Value Ref Range Status   Adenovirus NOT DETECTED NOT DETECTED Final   Coronavirus 229E NOT DETECTED NOT DETECTED Final    Comment: (NOTE) The Coronavirus on the Respiratory Panel, DOES NOT test for the novel  Coronavirus (2019 nCoV)    Coronavirus HKU1 NOT DETECTED NOT DETECTED Final   Coronavirus NL63 NOT DETECTED NOT DETECTED Final   Coronavirus OC43 NOT DETECTED NOT DETECTED Final   Metapneumovirus NOT  DETECTED NOT DETECTED Final   Rhinovirus / Enterovirus NOT DETECTED NOT DETECTED Final   Influenza A NOT DETECTED NOT DETECTED Final   Influenza B NOT DETECTED NOT DETECTED Final   Parainfluenza Virus 1 NOT DETECTED NOT DETECTED Final   Parainfluenza Virus 2 NOT DETECTED NOT DETECTED Final   Parainfluenza Virus 3 NOT DETECTED NOT DETECTED Final   Parainfluenza Virus 4 NOT DETECTED NOT DETECTED Final   Respiratory Syncytial Virus NOT DETECTED NOT DETECTED Final   Bordetella pertussis NOT DETECTED NOT DETECTED Final   Bordetella Parapertussis NOT DETECTED NOT DETECTED Final   Chlamydophila pneumoniae NOT DETECTED NOT DETECTED Final   Mycoplasma pneumoniae NOT DETECTED NOT DETECTED Final    Comment: Performed at [facility], [address], [place], [place] [address]  Culture, blood (Routine X 2) w Reflex to ID Panel     Status: Abnormal (Preliminary result)   Collection Time: 01/24/24 12:14 AM   Specimen: BLOOD LEFT HAND  Result Value Ref Range Status   Specimen Description   Final    BLOOD LEFT HAND Performed at [facility], [address], [place], [place] [address]    Special Requests   Final    BOTTLES DRAWN AEROBIC AND ANAEROBIC Blood Culture results may not be optimal due to an inadequate volume of blood received in culture bottles Performed at [facility], [address] [place], [place] [address]    Culture  Setup Time   Final    GRAM POSITIVE COCCI IN BOTH AEROBIC AND ANAEROBIC BOTTLES CRITICAL RESULT CALLED TO, READ BACK BY AND VERIFIED WITH: PHARMD ESABRA NAPOLEON [id number] @ 2318 FH    Culture (A)  Final    ENTEROCOCCUS FAECALIS SUSCEPTIBILITIES TO FOLLOW Performed at [facility], [address] [place], [place] [address]    Report Status PENDING  Incomplete  Blood Culture ID Panel (Reflexed)     Status: Abnormal   Collection Time: 01/24/24 12:14 AM  Result Value Ref Range Status   Enterococcus faecalis DETECTED (A) NOT DETECTED Final     Comment: CRITICAL RESULT CALLED TO, READ BACK BY AND VERIFIED WITH: PHARMD ESABRA NAPOLEON [id number] @ 2318 FH    Enterococcus Faecium NOT DETECTED NOT DETECTED Final   Listeria monocytogenes NOT DETECTED NOT DETECTED Final   Staphylococcus species NOT DETECTED NOT DETECTED Final   Staphylococcus aureus (BCID) NOT DETECTED NOT DETECTED Final   Staphylococcus epidermidis NOT DETECTED  NOT DETECTED Final   Staphylococcus lugdunensis NOT DETECTED NOT DETECTED Final   Streptococcus species NOT DETECTED NOT DETECTED Final   Streptococcus agalactiae NOT DETECTED NOT DETECTED Final   Streptococcus pneumoniae NOT DETECTED NOT DETECTED Final   Streptococcus pyogenes NOT DETECTED NOT DETECTED Final   A.calcoaceticus-baumannii NOT DETECTED NOT DETECTED Final   Bacteroides fragilis NOT DETECTED NOT DETECTED Final   Enterobacterales NOT DETECTED NOT DETECTED Final   Enterobacter cloacae complex NOT DETECTED NOT DETECTED Final   Escherichia coli NOT DETECTED NOT DETECTED Final   Klebsiella aerogenes NOT DETECTED NOT DETECTED Final   Klebsiella oxytoca NOT DETECTED NOT DETECTED Final   Klebsiella pneumoniae NOT DETECTED NOT DETECTED Final   Proteus species NOT DETECTED NOT DETECTED Final   Salmonella species NOT DETECTED NOT DETECTED Final   Serratia marcescens NOT DETECTED NOT DETECTED Final   Haemophilus influenzae NOT DETECTED NOT DETECTED Final   Neisseria meningitidis NOT DETECTED NOT DETECTED Final   Pseudomonas aeruginosa NOT DETECTED NOT DETECTED Final   Stenotrophomonas maltophilia NOT DETECTED NOT DETECTED Final   Candida albicans NOT DETECTED NOT DETECTED Final   Candida auris NOT DETECTED NOT DETECTED Final   Candida glabrata NOT DETECTED NOT DETECTED Final   Candida krusei NOT DETECTED NOT DETECTED Final   Candida parapsilosis NOT DETECTED NOT DETECTED Final   Candida tropicalis NOT DETECTED NOT DETECTED Final   Cryptococcus neoformans/gattii NOT DETECTED NOT DETECTED Final   Vancomycin   resistance NOT DETECTED NOT DETECTED Final    Comment: Performed at [facility], [address] [place], [place] [address]  MRSA Next Gen by PCR, Nasal     Status: None   Collection Time: 01/24/24  2:03 AM   Specimen: Nasal Mucosa; Nasal Swab  Result Value Ref Range Status   MRSA by PCR Next Gen NOT DETECTED NOT DETECTED Final    Comment: (NOTE) The GeneXpert MRSA Assay (FDA approved for NASAL specimens only), is one component of a comprehensive MRSA colonization surveillance program. It is not intended to diagnose MRSA infection nor to guide or monitor treatment for MRSA infections. Test performance is not FDA approved in patients less than 46 years old. Performed at [facility], [address] [place], [place] [address]     Coagulation Studies: Recent Labs    01/23/24 1859  LABPROT 25.6*  INR 2.2*    Urinalysis: No results for input(s): COLORURINE, LABSPEC, PHURINE, GLUCOSEU, HGBUR, BILIRUBINUR, KETONESUR, PROTEINUR, UROBILINOGEN, NITRITE, LEUKOCYTESUR in the last 72 hours.  Invalid input(s): APPERANCEUR    Imaging: MR BRAIN WO CONTRAST Result Date: 01/25/2024 EXAM: MRI BRAIN WITHOUT CONTRAST 01/25/2024 02:21:13 PM TECHNIQUE: Multiplanar multisequence MRI of the head/brain was performed without the administration of intravenous contrast. COMPARISON: None available. CLINICAL HISTORY: Mental status change, unknown cause. The patient presents with a mental status change of unknown cause. 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 T2 hyperintensities in the cerebral white matter bilaterally are nonspecific but compatible with mild chronic small vessel ischemic disease. There is mild to moderate cerebral atrophy. No intracranial hemorrhage, mass, midline shift, hydrocephalus, or extra-axial fluid collection is identified. Major intracranial vascular flow voids are preserved.  ORBITS: No significant abnormality. SINUSES AND MASTOIDS: No significant abnormality. BONES AND SOFT TISSUES: Normal marrow signal. No significant soft tissue abnormality. IMPRESSION: 1. 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 2. Mild chronic small vessel ischemic disease. Electronically signed by: Dasie Hamburg MD 01/25/2024 04:40 PM  EST RP Workstation: [id number]   ECHOCARDIOGRAM COMPLETE Result Date: 01/25/2024    ECHOCARDIOGRAM REPORT   Patient Name:   RASHAAN WYLES Date of Exam: 01/25/2024 Medical Rec #:  [id number]     Height:       77.0 in Accession #:    [id number]    Weight:       219.1 lb Date of Birth:  16-Feb-1940     BSA:          2.325 m Patient Age:    83 years      BP:           111/47 mmHg Patient Gender: M             HR:           113 bpm. Exam Location:  [facility] Procedure: 2D Echo, Cardiac Doppler, Color Doppler, 3D Echo and Strain Analysis            (Both Spectral and Color Flow Doppler were utilized during            procedure). Indications:     Shock R57.9  History:         Patient has prior history of Echocardiogram examinations, most                  recent 07/18/2023. Risk Factors:Diabetes and Hypertension.  Sonographer:     Christopher Furnace Referring Phys:  [id number] LONELL KANDICE MOOSE Diagnosing Phys: Cara JONETTA Lovelace MD  Sonographer Comments: Global longitudinal strain was attempted. IMPRESSIONS  1. Left ventricular ejection fraction, by estimation, is 45 to 50%. The left ventricle has mildly decreased function. The left ventricle demonstrates global hypokinesis. The left ventricular internal cavity size was mildly dilated. There is mild asymmetric left ventricular hypertrophy. Left ventricular diastolic parameters are consistent with Grade II diastolic dysfunction (pseudonormalization). The average left ventricular global longitudinal strain is 6.7 %. The global longitudinal strain is abnormal.  2. Right ventricular systolic function is low normal. The right ventricular size is mildly  enlarged.  3. Left atrial size was mildly dilated.  4. The mitral valve is grossly normal. Mild to moderate mitral valve regurgitation.  5. Tricuspid valve regurgitation is mild to moderate.  6. The aortic valve is calcified. Aortic valve regurgitation is mild. Mild aortic valve stenosis. FINDINGS  Left Ventricle: Left ventricular ejection fraction, by estimation, is 45 to 50%. The left ventricle has mildly decreased function. The left ventricle demonstrates global hypokinesis. The average left ventricular global longitudinal strain is 6.7 %. Strain was performed and the global longitudinal strain is abnormal. The left ventricular internal cavity size was mildly dilated. There is mild asymmetric left ventricular hypertrophy. Left ventricular diastolic parameters are consistent with Grade II diastolic dysfunction (pseudonormalization). Right Ventricle: The right ventricular size is mildly enlarged. No increase in right ventricular wall thickness. Right ventricular systolic function is low normal. Left Atrium: Left atrial size was mildly dilated. Right Atrium: Right atrial size was normal in size. Pericardium: There is no evidence of pericardial effusion. Mitral Valve: The mitral valve is grossly normal. Mild to moderate mitral valve regurgitation. MV peak gradient, 12.4 mmHg. The mean mitral valve gradient is 6.0 mmHg. Tricuspid Valve: The tricuspid valve is normal in structure. Tricuspid valve regurgitation is mild to moderate. Aortic Valve: The aortic valve is calcified. Aortic valve regurgitation is mild. Aortic regurgitation PHT measures 206 msec. Mild aortic stenosis is present. Aortic valve mean gradient measures 16.2 mmHg. Aortic valve peak gradient measures 30.7  mmHg. Aortic valve area, by VTI measures 1.51 cm. Pulmonic Valve: The pulmonic valve was not well visualized. Pulmonic valve regurgitation is not visualized. Aorta: The aortic root was not well visualized. IAS/Shunts: No atrial level shunt detected  by color flow Doppler. Additional Comments: 3D was performed not requiring image post processing on an independent workstation and was abnormal.  LEFT VENTRICLE PLAX 2D LVIDd:         5.20 cm      Diastology LVIDs:         3.90 cm      LV e' medial:    3.59 cm/s LV PW:         1.10 cm      LV E/e' medial:  39.0 LV IVS:        1.40 cm      LV e' lateral:   8.27 cm/s LVOT diam:     2.00 cm      LV E/e' lateral: 16.9 LV SV:         62 LV SV Index:   26           2D Longitudinal Strain LVOT Area:     3.14 cm     2D Strain GLS (A4C):   5.9 % LV IVRT:       48 msec      2D Strain GLS (A3C):   6.1 %                             2D Strain GLS (A2C):   8.2 %                             2D Strain GLS Avg:     6.7 % LV Volumes (MOD) LV vol d, MOD A2C: 147.0 ml LV vol d, MOD A4C: 112.0 ml LV vol s, MOD A2C: 83.5 ml LV vol s, MOD A4C: 72.4 ml LV SV MOD A2C:     63.5 ml LV SV MOD A4C:     112.0 ml LV SV MOD BP:      47.7 ml RIGHT VENTRICLE RV Basal diam:  4.00 cm RV Mid diam:    3.50 cm RV S prime:     11.90 cm/s TAPSE (M-mode): 1.8 cm LEFT ATRIUM             Index        RIGHT ATRIUM           Index LA diam:        4.00 cm 1.72 cm/m   RA Area:     15.20 cm LA Vol (A2C):   97.8 ml 42.06 ml/m  RA Volume:   31.10 ml  13.37 ml/m LA Vol (A4C):   91.0 ml 39.13 ml/m LA Biplane Vol: 96.0 ml 41.28 ml/m  AORTIC VALVE AV Area (Vmax):    1.27 cm AV Area (Vmean):   1.28 cm AV Area (VTI):     1.51 cm AV Vmax:           277.25 cm/s AV Vmean:          183.000 cm/s AV VTI:            0.409 m AV Peak Grad:      30.7 mmHg AV Mean Grad:      16.2 mmHg LVOT Vmax:         112.00 cm/s  LVOT Vmean:        74.500 cm/s LVOT VTI:          0.196 m LVOT/AV VTI ratio: 0.48 AI PHT:            206 msec  AORTA Ao Root diam: 2.70 cm MITRAL VALVE                  TRICUSPID VALVE MV Area (PHT): 5.31 cm       TR Peak grad:   26.4 mmHg MV Area VTI:   2.15 cm       TR Vmax:        257.00 cm/s MV Peak grad:  12.4 mmHg MV Mean grad:  6.0 mmHg       SHUNTS MV  Vmax:       1.76 m/s       Systemic VTI:  0.20 m MV Vmean:      119.0 cm/s     Systemic Diam: 2.00 cm MV Decel Time: 143 msec MR Peak grad:    108.6 mmHg MR Mean grad:    74.0 mmHg MR Vmax:         521.00 cm/s MR Vmean:        418.0 cm/s MR PISA:         4.02 cm MR PISA Eff ROA: 25 mm MR PISA Radius:  0.80 cm MV E velocity: 140.00 cm/s MV A velocity: 114.00 cm/s MV E/A ratio:  1.23 Dwayne D Callwood MD Electronically signed by Cara JONETTA Lovelace MD Signature Date/Time: 01/25/2024/2:49:15 PM    Final    CT ABDOMEN PELVIS WO CONTRAST Result Date: 01/25/2024 EXAM: CT ABDOMEN AND PELVIS WITHOUT CONTRAST 01/25/2024 11:06:03 AM TECHNIQUE: CT of the abdomen and pelvis was performed without the administration of intravenous contrast. Multiplanar reformatted images are provided for review. Automated exposure control, iterative reconstruction, and/or weight-based adjustment of the mA/kV was utilized to reduce the radiation dose to as low as reasonably achievable. COMPARISON: 09/07/2023 CLINICAL HISTORY: Sepsis. FINDINGS: LOWER CHEST: Coronary and aortic atherosclerosis with moderate cardiomegaly. Trace bilateral pleural effusions with mild atelectasis in the lung bases. Hazy airspace opacity medially in the left lower lobe on image 9 series 4 is nonspecific, early bronchopneumonia not excluded. LIVER: The liver is unremarkable. GALLBLADDER AND BILE DUCTS: Contracted gallbladder. No biliary ductal dilatation. SPLEEN: Abnormal hypodense lesions in the spleen measuring up to 10.5 x 4.5 x 4.0 cm (volume = 99 cm\S\3), not present on 09/07/2023. Splenic abscess is not excluded. PANCREAS: No acute abnormality. ADRENAL GLANDS: No acute abnormality. KIDNEYS, URETERS AND BLADDER: Scattered hypodense and hyperdense renal lesions similar to previous, suspicious for polycystic kidney disease. Enhancement not assessed on today's noncontrast examination. Overall morphology similar to the 09/07/2023 exam. No stones in the kidneys or  ureters. No hydronephrosis. No perinephric or periureteral stranding. Urinary bladder is unremarkable. GI AND BOWEL: Stomach demonstrates no acute abnormality. Stable dilated appendix at 9 mm diameter on image 73 series 2 with internal high density suspicious for appendicolith, similar appearance on 09/07/2023. Given the chronic prominence, an underlying appendiceal mass cannot be readily excluded. No current periappendiceal inflammatory findings. There is no bowel obstruction. PERITONEUM AND RETROPERITONEUM: No ascites. No free air. New nonspecific presacral edema. VASCULATURE: Aorta is normal in caliber. Atherosclerosis atheromatous plaque at the origin of the superior mesenteric artery. LYMPH NODES: No lymphadenopathy. REPRODUCTIVE ORGANS: Stable prostatomegaly. BONES AND SOFT TISSUES: Low level subcutaneous edema along the flanks. Bridging spurring of both sacroiliac joints. 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 Grade 1 degenerative anterolisthesis at L4-L5 with lower lumbar spondylosis and degenerative disc disease and suspected bilateral foraminal stenosis at L3-L4 and L4-L5 with borderline bilateral foraminal stenosis at L5-S1. A small umbilical hernia contains adipose tissue. IMPRESSION: 1. Abnormal hypodense lesions in the spleen measuring up to 10.5 x 4.5 x 4.0 cm (volume = 99 cm\S\3), not present on prior exam, raising concern for splenic abscesses or hematomas. 2. Stable dilated appendix at 9 mm diameter with internal high density suspicious for appendicolith. Given the chronic prominence, an underlying appendiceal mass cannot be readily excluded. 3. Hazy airspace opacity medially in the left lower lobe, nonspecific, with early bronchopneumonia not excluded. 4. Scattered hypodense and hyperdense renal lesions are suspicious for polycystic kidney disease. 5. New nonspecific presacral edema. 6. 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 7. Grade 1 degenerative  anterolisthesis at L4-5 with lower lumbar spondylosis and degenerative disc disease and suspected bilateral foraminal stenosis at L3-4 and L4-5 with borderline bilateral foraminal stenosis at L5-S1. 8. Coronary and aortic atherosclerosis with moderate cardiomegaly. 9. Trace bilateral pleural effusions with mild atelectasis in the lung bases. 10. Stable prostatomegaly. Impression #1 will be telephoned to the referring provider or the referring provider's representative by professional radiology assistant [facility]) personnel, with communication documented in the clario dashboard. Electronically signed by: Ryan Salvage MD MD 01/25/2024 11:37 AM EST RP Workstation: [id number]     Medications:    sodium chloride  Stopped (01/25/24 0956)   ampicillin  (OMNIPEN) IV Stopped (01/25/24 2221)   magnesium  sulfate bolus IVPB     norepinephrine  (LEVOPHED ) Adult infusion 6 mcg/min (01/26/24 0600)    amiodarone   100 mg Oral Daily   Chlorhexidine  Gluconate Cloth  6 each Topical Daily   cholecalciferol   5,000 Units Oral Daily   diclofenac  Sodium  2 g Topical QID   docusate sodium   100 mg Oral QHS   donepezil   10 mg Oral QHS   finasteride   5 mg Oral Daily   insulin  aspart  0-15 Units Subcutaneous TID WC   insulin  aspart  0-5 Units Subcutaneous QHS   midodrine   10 mg Oral TID WC   oseltamivir   30 mg Oral Q M,W,F-1800   pantoprazole  (PROTONIX ) IV  40 mg Intravenous Daily   potassium chloride   20 mEq Oral Once   tamsulosin   0.4 mg Oral Daily   acetaminophen  **OR** acetaminophen , ALPRAZolam , lidocaine  (PF), magnesium  hydroxide, morphine  injection, nitroGLYCERIN , ondansetron  **OR** ondansetron  (ZOFRAN ) IV, mouth rinse, traZODone   Assessment/ Plan:  Mr. JIMMYLEE RATTERREE is a 84 y.o.  male  with ESRD on hemodialysis, hypertension, diabetes mellitus type II, BPH, chronic afib, peripheral arterial disease, and diastolic congestive heart failure who presented to hospital with atypical chest pain found to have hgb 4.5  and elevated troponin.   Outpatient dialysis [facility] [address]/MWF/AVF and is followed by Dr. Marcelino  End stage Renal Disease on hemodialysis  Patient will be due for hemodialysis treatment today.  Currently on norepinephrine  and will need to monitor blood pressure closely.  Anemia with chronic kidney disease and suspected acute blood loss.  Hgb 4.5, POA. Suspect GI bleed. Fecal occult positive.  Hemoglobin currently 8.6.  Continue to monitor CBC closely.  Latest Reference Range & Units 01/23/24 03:23  Iron 45 - 182 ug/dL 29 (L)  UIBC ug/dL 850  TIBC 749 - 549 ug/dL 821 (L)  Saturation Ratios 17.9 - 39.5 % 16 (L)  Ferritin 24 - 336 ng/mL 882 (H)  (L): Data is abnormally low (H):  Data is abnormally high    Hypotension:   Patient remains on norepinephrine  at this time.  Continue to monitor blood pressure trend.   Diabetes mellitus type II with chronic kidney disease  Most recent A1C 5.9 01/23/24. SSI managed by primary team  5. Chronic Afib  Patient on amiodarone .  6.  Possible splenic abscess.  Appreciate ID input.  Patient to undergo IR procedure for this today.   LOS: 3 Prima Rayner 1/14/20267:49 AM  "

## 2024-01-26 NOTE — Progress Notes (Signed)
" °  Bedside ICU 4  Informed consent signed and in chart.    TX duration:2.5hrs   Hand-off given to patient's nurse. No acute distress noted  no c/o   Access used: LAVF Access issues: none   Total UF removed: 1.0L Medication(s) given:  Post HD VS: wnl Post HD weight:      Olivia Hurst LPN [facility]   "

## 2024-01-26 NOTE — Progress Notes (Signed)
" ° ° °  PROCEDURAL EXPEDITER PROGRESS NOTE  Patient Name: Jeffery Joseph  DOB:[birth year] Date of Admission: 01/22/2024  Date of Assessment:01/26/2024   -------------------------------------------------------------------------------------------------------------------   Brief clinical summary: 84 yr old male going for TEE on 01/27/24.  Type 2 DM,ESRD on HD on MWF HTN,  Orders in place:  Yes   Communication with surgical team if no orders: n/a  Labs, test, and orders reviewed: n/a  Requires surgical clearance:  No  What type of clearance: n/a  Clearance received: n/a  Barriers noted:n/a   Intervention provided by [facility] team: n/a  Barrier resolved:  not applicable   -------------------------------------------------------------------------------------------------------------------  [employer], Ronal DELENA Bald Please contact us  directly via secure chat (search for [facility]) or by calling us  at [phone] [facility]).  "

## 2024-01-26 NOTE — Progress Notes (Signed)
 [facility] CARDIOLOGY PROGRESS NOTE   Patient ID: Jeffery Joseph MRN: [id number] DOB/AGE: 08-31-1940 84 y.o.  Admit date: 01/22/2024 Referring Physician Dr. Madison Peaches Primary Physician Epifanio Alm SQUIBB, MD  Primary Cardiologist Dr. Florencio Reason for Consultation AoCHF  HPI: KELVON GIANNINI is a 84 y.o. 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 Cardiology was consulted for further evaluation.   Interval History:  - Patient seen and examined this morning, resting comfortably in hospital bed with daughter at bedside. - Remains on levo.  - Feels his breathing is overall stable.  Denies any chest discomfort or palpitations. HR overall stable.  Review of systems complete and found to be negative unless listed above   Vitals:   01/26/24 0830 01/26/24 0845 01/26/24 0900 01/26/24 0915  BP: (!) 108/44 (!) 89/37 (!) 93/38 (!) 98/46  Pulse: (!) 116 (!) 112 (!) 113 (!) 112  Resp: 18 11 17 15   Temp:      TempSrc:      SpO2: 95% 95% 95% 96%  Weight:      Height:         Intake/Output Summary (Last 24 hours) at 01/26/2024 0947 Last data filed at 01/26/2024 9092 Gross per 24 hour  Intake 1176.33 ml  Output 900 ml  Net 276.33 ml     PHYSICAL EXAM General: Chronically ill-appearing male, well nourished, in no acute distress. HEENT: Normocephalic and atraumatic. Neck: No JVD.  Lungs: Normal respiratory effort on room air. Clear bilaterally to auscultation. No wheezes, crackles, rhonchi.  Heart: HRR, elevated rate. Normal S1 and S2 without gallops or murmurs. Radial & DP pulses 2+ bilaterally. Abdomen: Non-distended appearing.  Msk: Normal strength and tone for age. Extremities: No clubbing, cyanosis or edema.   Neuro: Alert and oriented X 3. Psych: Mood appropriate, affect congruent.     LABS: Basic Metabolic Panel: Recent Labs    01/25/24 0330 01/26/24 0327  NA 130* 128*  K 3.3* 3.5  CL 93* 93*  CO2 27 26  GLUCOSE 147* 137*  BUN 35* 39*  CREATININE 3.62* 3.77*  CALCIUM  8.3* 8.3*  MG 1.9 1.8  PHOS 2.7 3.2   Liver Function Tests: Recent Labs    01/24/24 0317 01/24/24 1554 01/25/24 0330  AST 34  --  22  ALT 48*  --  33  ALKPHOS 66  --  64  BILITOT 0.9  --  0.7  PROT 6.7  --  6.5  ALBUMIN  2.7* 2.7* 2.6*   No results for input(s): LIPASE, AMYLASE in the last 72 hours. CBC: Recent Labs    01/25/24 1309 01/26/24 0327  WBC 11.3* 10.5  NEUTROABS 8.8*  --   HGB 9.1* 8.6*  HCT 28.6* 27.1*  MCV 77.3* 78.1*  PLT 202 193   Cardiac Enzymes: No results for input(s): CKTOTAL, CKMB, CKMBINDEX, TROPONINIHS in the last 72 hours. BNP: No results for input(s): BNP in the last 72 hours. D-Dimer: No results for input(s): DDIMER in the last 72 hours. Hemoglobin A1C: No results for input(s): HGBA1C in the last 72 hours.  Fasting Lipid Panel: No results for input(s): CHOL, HDL, LDLCALC, TRIG, CHOLHDL, LDLDIRECT in the last 72 hours. Thyroid Function Tests: No results for input(s): TSH, T4TOTAL, T3FREE, THYROIDAB in the last 72 hours.  Invalid input(s): FREET3 Anemia Panel: Recent Labs    01/23/24  1008  VITAMINB12 1,698*  FOLATE 7.9    CT Angio Abd/Pel w/ and/or w/o Result Date: 01/26/2024 CLINICAL DATA:  Splenic fluid collection. Possible infarct versus abscess. EXAM: CTA ABDOMEN AND PELVIS WITHOUT AND WITH CONTRAST TECHNIQUE: Multidetector CT imaging of the abdomen and pelvis was performed using the standard protocol during bolus administration of intravenous contrast. Multiplanar reconstructed images and MIPs were obtained and reviewed to evaluate the vascular anatomy. RADIATION DOSE REDUCTION: This exam was performed according to the departmental dose-optimization program which includes automated exposure  control, adjustment of the mA and/or kV according to patient size and/or use of iterative reconstruction technique. CONTRAST:  OMNIPAQUE  IOHEXOL  350 MG/ML SOLN COMPARISON:  CT scan without contrast performed yesterday 01/25/2024 FINDINGS: VASCULAR Aorta: Normal caliber aorta without aneurysm, dissection, vasculitis or significant stenosis. Scattered atherosclerotic vascular calcifications. 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 The left hepatic artery is replaced to the left gastric artery. No aneurysm or dissection. SMA: Patent without evidence of aneurysm, dissection, vasculitis or significant stenosis. Renals: Solitary renal arteries bilaterally. Calcified plaque results in high-grade stenosis of the left renal artery. The right renal artery is patent but relatively small in caliber. No aneurysm or changes of FMD. IMA: Patent without evidence of aneurysm, dissection, vasculitis or significant stenosis. Inflow: Patent without evidence of aneurysm, dissection, vasculitis or significant stenosis. Proximal Outflow: Bilateral common femoral and visualized portions of the superficial and profunda femoral arteries are patent without evidence of aneurysm, dissection, vasculitis or significant stenosis. Veins: Widely patent portal, splenic and visceral veins. Review of the MIP images confirms the above findings. NON-VASCULAR Lower chest: Dependent atelectasis bilaterally. Cardiomegaly. Calcified atherosclerotic plaque along the coronary arteries. Thickening calcified aortic valve. Hepatobiliary: Normal hepatic contour and morphology. No discrete hepatic lesions. Normal appearance of the gallbladder. No intra or extrahepatic biliary ductal dilatation. Pancreas: Unremarkable. No pancreatic ductal dilatation or surrounding inflammatory changes. Spleen: Oblong rounded hypoechoic collection in the subcapsular aspect of the spleen extends from  cranial to caudal and measures 12.0 x 4.2 x 4.4 cm. The collection is circumscribed. No evidence of peripheral enhancement or hyperemia. There is a second smaller wedge-shaped focus of ill-defined low attenuation in the anterior lower pole of the spleen. Overall, these findings suggest a combination of acute and subacute splenic infarcts. Adrenals/Urinary Tract: Normal adrenal glands. 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 Similar findings have been noted on multiple prior examinations. Stomach/Bowel: Stomach is within normal limits. Appendix appears normal. No evidence of bowel wall thickening, distention, or inflammatory changes. Lymphatic: No suspicious lymphadenopathy. Reproductive: Prostatomegaly. Other: No abdominal wall hernia or abnormality. No abdominopelvic ascites. Musculoskeletal: No acute fracture or aggressive appearing lytic or blastic osseous lesion. Lower lumbar degenerative disc disease and facet arthropathy. IMPRESSION: 1. Oblong low-attenuation fluid collection in the subcapsular periphery of the spleen extending from cranial to caudal. There is a second smaller wedge-shaped and less well-defined region of decreased enhancement in the inferior and anterior aspect of the splenic tip. Overall findings are most suggestive of a combination of acute and subacute splenic infarct. No peripheral enhancement, hyperemia or internal gas to suggest the presence of infection. 2. 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 3. 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 4. Extensive cystic changes of both kidneys with cysts of variable density and complexity as seen on prior imaging. As before, outpatient MRI with gadolinium contrast could be  considered to exclude neoplasm if clinically warranted. 5. Additional ancillary findings as above. Electronically Signed   By: Wilkie Lent M.D.   On: 01/26/2024 08:46   MR  BRAIN WO CONTRAST Result Date: 01/25/2024 EXAM: MRI BRAIN WITHOUT CONTRAST 01/25/2024 02:21:13 PM TECHNIQUE: Multiplanar multisequence MRI of the head/brain was performed without the administration of intravenous contrast. COMPARISON: None available. CLINICAL HISTORY: Mental status change, unknown cause. The patient presents with a mental status change of unknown cause. 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 T2 hyperintensities in the cerebral white matter bilaterally are nonspecific but compatible with mild chronic small vessel ischemic disease. There is mild to moderate cerebral atrophy. No intracranial hemorrhage, mass, midline shift, hydrocephalus, or extra-axial fluid collection is identified. Major intracranial vascular flow voids are preserved. ORBITS: No significant abnormality. SINUSES AND MASTOIDS: No significant abnormality. BONES AND SOFT TISSUES: Normal marrow signal. No significant soft tissue abnormality. IMPRESSION: 1. 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 2. Mild chronic small vessel ischemic disease. Electronically signed by: Dasie Hamburg MD 01/25/2024 04:40 PM EST RP Workstation: [id number]   ECHOCARDIOGRAM COMPLETE Result Date: 01/25/2024    ECHOCARDIOGRAM REPORT   Patient Name:   Jeffery Joseph Date of Exam: 01/25/2024 Medical Rec #:  [id number]     Height:       77.0 in Accession #:    [id number]    Weight:       219.1 lb Date of Birth:  [birth year]     BSA:          2.325 m Patient Age:    83 years      BP:           111/47 mmHg Patient Gender: M             HR:           113 bpm. Exam Location:  [facility] Procedure: 2D Echo, Cardiac Doppler, Color Doppler, 3D Echo and Strain Analysis            (Both Spectral and Color Flow Doppler were utilized during            procedure). Indications:     Shock R57.9  History:         Patient has prior history of Echocardiogram examinations, most                  recent  07/18/2023. Risk Factors:Diabetes and Hypertension.  Sonographer:     Christopher Furnace Referring Phys:  [id number] LONELL KANDICE MOOSE Diagnosing Phys: Cara JONETTA Lovelace MD  Sonographer Comments: Global longitudinal strain was attempted. IMPRESSIONS  1. Left ventricular ejection fraction, by estimation, is 45 to 50%. The left ventricle has mildly decreased function. The left ventricle demonstrates global hypokinesis. The left ventricular internal cavity size was mildly dilated. There is mild asymmetric left ventricular hypertrophy. Left ventricular diastolic parameters are consistent with Grade II diastolic dysfunction (pseudonormalization). The average left ventricular global longitudinal strain is 6.7 %. The global longitudinal strain is abnormal.  2. Right ventricular systolic function is low normal. The right ventricular size is mildly enlarged.  3. Left atrial size was mildly dilated.  4. The mitral valve is grossly normal. Mild to moderate mitral valve regurgitation.  5. Tricuspid valve regurgitation is mild to moderate.  6. The aortic valve is calcified. Aortic valve regurgitation is mild. Mild aortic valve stenosis. FINDINGS  Left Ventricle: Left ventricular ejection fraction, by  estimation, is 45 to 50%. The left ventricle has mildly decreased function. The left ventricle demonstrates global hypokinesis. The average left ventricular global longitudinal strain is 6.7 %. Strain was performed and the global longitudinal strain is abnormal. The left ventricular internal cavity size was mildly dilated. There is mild asymmetric left ventricular hypertrophy. Left ventricular diastolic parameters are consistent with Grade II diastolic dysfunction (pseudonormalization). Right Ventricle: The right ventricular size is mildly enlarged. No increase in right ventricular wall thickness. Right ventricular systolic function is low normal. Left Atrium: Left atrial size was mildly dilated. Right Atrium: Right atrial size was normal in size.  Pericardium: There is no evidence of pericardial effusion. Mitral Valve: The mitral valve is grossly normal. Mild to moderate mitral valve regurgitation. MV peak gradient, 12.4 mmHg. The mean mitral valve gradient is 6.0 mmHg. Tricuspid Valve: The tricuspid valve is normal in structure. Tricuspid valve regurgitation is mild to moderate. Aortic Valve: The aortic valve is calcified. Aortic valve regurgitation is mild. Aortic regurgitation PHT measures 206 msec. Mild aortic stenosis is present. Aortic valve mean gradient measures 16.2 mmHg. Aortic valve peak gradient measures 30.7 mmHg. Aortic valve area, by VTI measures 1.51 cm. Pulmonic Valve: The pulmonic valve was not well visualized. Pulmonic valve regurgitation is not visualized. Aorta: The aortic root was not well visualized. IAS/Shunts: No atrial level shunt detected by color flow Doppler. Additional Comments: 3D was performed not requiring image post processing on an independent workstation and was abnormal.  LEFT VENTRICLE PLAX 2D LVIDd:         5.20 cm      Diastology LVIDs:         3.90 cm      LV e' medial:    3.59 cm/s LV PW:         1.10 cm      LV E/e' medial:  39.0 LV IVS:        1.40 cm      LV e' lateral:   8.27 cm/s LVOT diam:     2.00 cm      LV E/e' lateral: 16.9 LV SV:         62 LV SV Index:   26           2D Longitudinal Strain LVOT Area:     3.14 cm     2D Strain GLS (A4C):   5.9 % LV IVRT:       48 msec      2D Strain GLS (A3C):   6.1 %                             2D Strain GLS (A2C):   8.2 %                             2D Strain GLS Avg:     6.7 % LV Volumes (MOD) LV vol d, MOD A2C: 147.0 ml LV vol d, MOD A4C: 112.0 ml LV vol s, MOD A2C: 83.5 ml LV vol s, MOD A4C: 72.4 ml LV SV MOD A2C:     63.5 ml LV SV MOD A4C:     112.0 ml LV SV MOD BP:      47.7 ml RIGHT VENTRICLE RV Basal diam:  4.00 cm RV Mid diam:    3.50 cm RV S prime:     11.90 cm/s TAPSE (M-mode): 1.8 cm LEFT  ATRIUM             Index        RIGHT ATRIUM           Index LA diam:         4.00 cm 1.72 cm/m   RA Area:     15.20 cm LA Vol (A2C):   97.8 ml 42.06 ml/m  RA Volume:   31.10 ml  13.37 ml/m LA Vol (A4C):   91.0 ml 39.13 ml/m LA Biplane Vol: 96.0 ml 41.28 ml/m  AORTIC VALVE AV Area (Vmax):    1.27 cm AV Area (Vmean):   1.28 cm AV Area (VTI):     1.51 cm AV Vmax:           277.25 cm/s AV Vmean:          183.000 cm/s AV VTI:            0.409 m AV Peak Grad:      30.7 mmHg AV Mean Grad:      16.2 mmHg LVOT Vmax:         112.00 cm/s LVOT Vmean:        74.500 cm/s LVOT VTI:          0.196 m LVOT/AV VTI ratio: 0.48 AI PHT:            206 msec  AORTA Ao Root diam: 2.70 cm MITRAL VALVE                  TRICUSPID VALVE MV Area (PHT): 5.31 cm       TR Peak grad:   26.4 mmHg MV Area VTI:   2.15 cm       TR Vmax:        257.00 cm/s MV Peak grad:  12.4 mmHg MV Mean grad:  6.0 mmHg       SHUNTS MV Vmax:       1.76 m/s       Systemic VTI:  0.20 m MV Vmean:      119.0 cm/s     Systemic Diam: 2.00 cm MV Decel Time: 143 msec MR Peak grad:    108.6 mmHg MR Mean grad:    74.0 mmHg MR Vmax:         521.00 cm/s MR Vmean:        418.0 cm/s MR PISA:         4.02 cm MR PISA Eff ROA: 25 mm MR PISA Radius:  0.80 cm MV E velocity: 140.00 cm/s MV A velocity: 114.00 cm/s MV E/A ratio:  1.23 Dwayne D Callwood MD Electronically signed by Cara JONETTA Lovelace MD Signature Date/Time: 01/25/2024/2:49:15 PM    Final    CT ABDOMEN PELVIS WO CONTRAST Result Date: 01/25/2024 EXAM: CT ABDOMEN AND PELVIS WITHOUT CONTRAST 01/25/2024 11:06:03 AM TECHNIQUE: CT of the abdomen and pelvis was performed without the administration of intravenous contrast. Multiplanar reformatted images are provided for review. Automated exposure control, iterative reconstruction, and/or weight-based adjustment of the mA/kV was utilized to reduce the radiation dose to as low as reasonably achievable. COMPARISON: 09/07/2023 CLINICAL HISTORY: Sepsis. FINDINGS: LOWER CHEST: Coronary and aortic atherosclerosis with moderate cardiomegaly. Trace  bilateral pleural effusions with mild atelectasis in the lung bases. Hazy airspace opacity medially in the left lower lobe on image 9 series 4 is nonspecific, early bronchopneumonia not excluded. LIVER: The liver is unremarkable. GALLBLADDER AND BILE DUCTS: Contracted gallbladder. No biliary ductal dilatation. SPLEEN: Abnormal hypodense lesions in  the spleen measuring up to 10.5 x 4.5 x 4.0 cm (volume = 99 cm\S\3), not present on 09/07/2023. Splenic abscess is not excluded. PANCREAS: No acute abnormality. ADRENAL GLANDS: No acute abnormality. KIDNEYS, URETERS AND BLADDER: Scattered hypodense and hyperdense renal lesions similar to previous, suspicious for polycystic kidney disease. Enhancement not assessed on today's noncontrast examination. Overall morphology similar to the 09/07/2023 exam. No stones in the kidneys or ureters. No hydronephrosis. No perinephric or periureteral stranding. Urinary bladder is unremarkable. GI AND BOWEL: Stomach demonstrates no acute abnormality. Stable dilated appendix at 9 mm diameter on image 73 series 2 with internal high density suspicious for appendicolith, similar appearance on 09/07/2023. Given the chronic prominence, an underlying appendiceal mass cannot be readily excluded. No current periappendiceal inflammatory findings. There is no bowel obstruction. PERITONEUM AND RETROPERITONEUM: No ascites. No free air. New nonspecific presacral edema. VASCULATURE: Aorta is normal in caliber. Atherosclerosis atheromatous plaque at the origin of the superior mesenteric artery. LYMPH NODES: No lymphadenopathy. REPRODUCTIVE ORGANS: Stable prostatomegaly. BONES AND SOFT TISSUES: Low level subcutaneous edema along the flanks. Bridging spurring of both sacroiliac joints. 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 Grade 1 degenerative anterolisthesis at L4-L5 with lower lumbar spondylosis and degenerative disc disease and suspected bilateral foraminal stenosis at L3-L4 and  L4-L5 with borderline bilateral foraminal stenosis at L5-S1. A small umbilical hernia contains adipose tissue. IMPRESSION: 1. Abnormal hypodense lesions in the spleen measuring up to 10.5 x 4.5 x 4.0 cm (volume = 99 cm\S\3), not present on prior exam, raising concern for splenic abscesses or hematomas. 2. Stable dilated appendix at 9 mm diameter with internal high density suspicious for appendicolith. Given the chronic prominence, an underlying appendiceal mass cannot be readily excluded. 3. Hazy airspace opacity medially in the left lower lobe, nonspecific, with early bronchopneumonia not excluded. 4. Scattered hypodense and hyperdense renal lesions are suspicious for polycystic kidney disease. 5. New nonspecific presacral edema. 6. 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 7. Grade 1 degenerative anterolisthesis at L4-5 with lower lumbar spondylosis and degenerative disc disease and suspected bilateral foraminal stenosis at L3-4 and L4-5 with borderline bilateral foraminal stenosis at L5-S1. 8. Coronary and aortic atherosclerosis with moderate cardiomegaly. 9. Trace bilateral pleural effusions with mild atelectasis in the lung bases. 10. Stable prostatomegaly. Impression #1 will be telephoned to the referring provider or the referring provider's representative by professional radiology assistant [facility] personnel, with communication documented in the clario dashboard. Electronically signed by: Ryan Salvage MD MD 01/25/2024 11:37 AM EST RP Workstation: [id number]     ECHO 07/2023: 1. Left ventricular ejection fraction, by estimation, is 55 to 60%. The left ventricle has normal function. The left ventricle has no regional wall motion abnormalities. The left ventricular internal cavity size was mildly dilated. There is moderate concentric left ventricular hypertrophy. Left ventricular diastolic parameters are consistent with Grade III diastolic dysfunction  (restrictive).   2. Right  ventricular systolic function is normal. The right ventricular  size is normal.   3. Left atrial size was moderately dilated.   4. Right atrial size was mild to moderately dilated.   5. The mitral valve is grossly normal. Trivial mitral valve regurgitation.   6. Tricuspid valve regurgitation is mild to moderate.   7. The aortic valve is grossly normal. Aortic valve regurgitation is  mild. Mild aortic valve stenosis.    TELEMETRY (personally reviewed): Atrial flutter rate 110s  EKG (personally reviewed): Atrial flutter rate 111 bpm, PVCs  DATA reviewed by me 01/26/2024: last 24h  vitals tele labs imaging I/O, hospitalist progress note, PCCM notes  Principal Problem:   Chest pain Active Problems:   Chronic atrial fibrillation (HCC)   Type 2 diabetes mellitus with chronic kidney disease, without long-term current use of insulin  (HCC)   BPH (benign prostatic hyperplasia)   End-stage renal disease on hemodialysis (HCC)   Influenza   Pressure injury of skin   Bacteremia   Splenic abscess   Bacteremia due to Enterococcus    ASSESSMENT AND PLAN: Jeffery Joseph is a 84 y.o. 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 Cardiology was consulted for further evaluation.   # Chronic atrial flutter # Acute on chronic HFpEF # ESRD on HD # Anemia Patient presented with worsening shortness of breath and chest tightness.  EKG without acute ischemic changes but did demonstrate atrial flutter which he has a known history of.  Hemoglobin was 4.5 on admission and he received transfusion, up to 9.1 this a.m.  Started on IV heparin . - Continue IV heparin , monitor H&H closely. - MRI brain yesterday revealed acute stroke, CTA suggestive of splenic infarct.  Plan for IR procedure today for splenic collection.  Blood  cultures positive for Enterococcus faecalis.  ID recommending TEE which I have tentatively scheduled for tomorrow.  Plan discussed with patient and daughter. - Suspect troponin secondary to demand ischemia.  - Continue amiodarone  100 mg daily.  Dose was decreased at outpatient appointment due to borderline slow heart rate.  Could consider transitioning to IV infusions however rates have remained stable and he appears relatively asymptomatic. - Continue atorvastatin  80 mg daily. - HD as per nephrology, appreciate their recommendations.  This patient's case was discussed and created with Dr. Florencio and he is in agreement.  Signed:  Danita Bloch, PA-C  01/26/2024, 9:47 AM [facility] Cardiology

## 2024-01-26 NOTE — Progress Notes (Signed)
 Heart Failure Navigator Progress Note Assessed for Heart & Vascular TOC clinic readiness.  Does not meet criteria due to current [facility] Cardiology patient.  Navigator will sign off at this time.  Charmaine Pines, RN, BSN [facility] Heart Failure Navigator Secure Chat Only

## 2024-01-26 NOTE — Progress Notes (Signed)
 "  NAME:  Jeffery Joseph, MRN:  [id number], DOB:  [birth year], LOS: 3 ADMISSION DATE:  01/22/2024, CONSULTATION DATE: 01/24/2024 REFERRING MD: Dr. 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 Symptoms began several days prior to presentation. Patient was taking oseltamivir  for possible influenza. He reported dry cough and denied nausea, vomiting, abdominal pain, diarrhea, melena, or hematochezia. Last hemodialysis session was on Friday prior to admission.  ED Course: On arrival, vital signs: BP 106/49 mmHg, HR 111 bpm, RR 20, T 23F, SpO? 100% on room air. Initial labs showed WBC 11.8 K/L, Hgb 8.0 g/dL, platelets 790 K/L, sodium 134 mmol/L, potassium 3.5 mmol/L, BUN 46 mg/dL, creatinine 4.7 mg/dL, lactate 2.2 mmol/L, AST 73 U/L, ALT 70 U/L. Chest X-ray demonstrated cardiomegaly without acute consolidation. CT chest without contrast showed cardiomegaly and no acute airspace disease. Patient received 500 mL IV normal saline bolus, famotidine , and antacids and was admitted to TRH service for observation.  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), T2DM, HTN, PAD, chronic atrial fibrillation on anticoagulation, chronic anemia, anxiety/depression, and BPH   Micro Data  COVID/Influenza A&B/RSV 01/11>>negative  RVP 01/11>>negative  Blood x2 01/11>>enterococcus Faecalis  MRSA PCR 01/12>>negative  Tracheal aspirate 01/13>>  Significant Hospital Events: Including procedures, antibiotic start and stop dates in addition to other pertinent events   1/11: Admitted to trh service 1/12: Became hypotensive  started on levophed , critical care consulted 1/13: Pt continues to require levophed  gtt complains of his stomach not feeling good CT   Abd Pelvis pending concerning for splenic fluid collection concerning for possible abscess, Surgery and IR consulted, recommends aspiration and drain placement.  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 1/14: No acute events overnight, afebrile, remains on Levophed  (weaned to 6 mcg).  More awake and alert, consult Neurology for acute CVA on MRI yesterday.  Tentative plan for IR to perform splenic aspiration/drain today.  Cardiology tentatively planning for TEE tomorrow.  Interim History / Subjective:  As outlined above under significant events   Objective    Blood pressure (!) 95/38, pulse (!) 114, temperature 98.5 F (36.9 C), temperature source Oral, resp. rate 16, height 6' 5 (1.956 m), weight 100.2 kg, SpO2 97%.        Intake/Output Summary (Last 24 hours) at 01/26/2024 0735 Last data filed at 01/26/2024 0600 Gross per 24 hour  Intake 1108.18 ml  Output 650 ml  Net 458.18 ml   Filed Weights   01/24/24 0122 01/25/24 0630 01/26/24 0500  Weight: 96.9 kg 99.4 kg 100.2 kg    Examination: General: Acute on chronically-ill appearing male, NAD on RA  HENT: Supple, no JVD  Lungs: Diminished throughout, even, non labored  Cardiovascular: Sinus tachycardia, no m/r/g, 2+ radial/1+, trace generalized edema Abdomen: Hypoactive BS x4, obese, taut, non tender  Extremities: Normal bulk and tone, moves all extremities  Neuro: Alert and oriented x2, follows commands, PERRLA GU: Deferred  Resolved problem list   Assessment and Plan   #Acute CVA #Dementia #Depression  MRI Brain 01/25/24: 5 mm acute infarct at the left temporooccipital junction: Mild chronic small vessel ischemic disease. -Treatment of metabolic derangements as outlined  above -Provide supportive care -Promote normal sleep/wake cycle and family presence -Avoid  sedating medications as able -Neurology consulted, appreciate input  -Continue outpatient donepezil  and prn xanax    #Shock: Septic and hypovolemic  #Elevated troponin due to demand ischemia  #Chronic atrial flutter/fibrillation   #Acute on chronic HFpEF  Hx: HTN and PAD  -Echocardiogram 01/25/24: LVEF 45 to 50%, grade 2 diastolic dysfunction, RV systolic function is low normal, RV size mildly enlarged mild to moderate mitral valve regurgitation, mild to moderate tricuspid valve regurgitation, mild aortic valve regurgitation, mild aortic valve stenosis -Continuous cardiac monitoring -Maintain MAP >65 -Cautious IV fluids -Vasopressors as needed to maintain MAP goal -Continue Midodrine   -Trend lactic acid until normalized -HS Troponin peaked at 789 -Volume removal with dialysis -Cardiology following, appreciate inpnt -Continue scheduled amiodarone  and finasteride  -Holding Anticoagulation for now given pending IR aspiration of splenic abscess and new CVA  #Severe Sepsis due to ... #Enterococcus Faecalis Bacteremia  #Concern for possible Splenic Abscess #Previous influenza infection  -Monitor fever curve -Trend WBC's & Procalcitonin -Follow cultures as above -ID following, appreciate input, Continue empiric Ampicillin  pending cultures & sensitivities - General Surgery and IR consulted, appreciate input ~tentative plan for drainage of abscess by IR on 1/14 -Tentative plan for TEE with cardiology on 1/15 -Continue Tamiflu    #Pulmonary edema  - Supplemental O2 for dyspnea and/or hypoxia - Maintain O2 sats 92% or higher  - Prn CXR's  - Fluid removal as tolerated during HD   #ESRD on HD  #Hypokalemia  #Hyponatremia  #Hypomagnesia  - Trend BMP  - Replace electrolytes as indicated  - Strict I&O's - Nephrology consulted appreciate input: HD per recommendations   #Anemia of chronic kidney disease  - Trend CBC  - Monitor for s/sx of bleeding  - Transfuse for hgb <7  Type II  diabetes mellitus  - CBG's ac/hs - SSI  - Target CBG readings 140 to 180 - Follow hypo/hyperglycemic protocol     Labs   CBC: Recent Labs  Lab 01/23/24 0430 01/23/24 0826 01/24/24 0317 01/24/24 1005 01/25/24 0330 01/25/24 1309 01/26/24 0327  WBC 5.8  --  8.9  --  9.5 11.3* 10.5  NEUTROABS  --   --   --   --   --  8.8*  --   HGB 4.5*   < > 8.2* 9.1* 8.7* 9.1* 8.6*  HCT 15.3*   < > 25.0* 27.7* 28.4* 28.6* 27.1*  MCV 82.3  --  76.7*  --  79.1* 77.3* 78.1*  PLT 117*  --  202  --  185 202 193   < > = values in this interval not displayed.    Basic Metabolic Panel: Recent Labs  Lab 01/23/24 0323 01/24/24 0317 01/24/24 1554 01/25/24 0330 01/26/24 0327  NA 133* 135 132* 130* 128*  K 3.4* 3.7 3.6 3.3* 3.5  CL 97* 97* 94* 93* 93*  CO2 23 25 27 27 26   GLUCOSE 105* 120* 173* 147* 137*  BUN 47* 52* 31* 35* 39*  CREATININE 4.68* 5.05* 3.37* 3.62* 3.77*  CALCIUM  8.0* 8.5* 8.3* 8.3* 8.3*  MG  --  2.0  --  1.9 1.8  PHOS  --  3.9 2.5 2.7 3.2   GFR: Estimated Creatinine Clearance: 18.4 mL/min (A) (by C-G formula based on SCr of 3.77 mg/dL (H)). Recent Labs  Lab 01/23/24 0323 01/23/24 0430 01/23/24 2200 01/24/24 0317 01/24/24 2318 01/25/24 0330 01/25/24 1158 01/25/24 1309 01/26/24 0327  PROCALCITON  --   --  100.00  --   --   --   --   --   --   WBC  --    < >  --  8.9  --  9.5  --  11.3* 10.5  LATICACIDVEN 1.5  --  1.4  --  1.5  --  2.1*  --   --    < > = values in this interval not displayed.    Liver Function Tests: Recent Labs  Lab 01/22/24 1745 01/24/24 0317 01/24/24 1554 01/25/24 0330  AST 73* 34  --  22  ALT 70* 48*  --  33  ALKPHOS 86 66  --  64  BILITOT 0.5 0.9  --  0.7  PROT 7.6 6.7  --  6.5  ALBUMIN  3.1* 2.7* 2.7* 2.6*   No results for input(s): LIPASE, AMYLASE in the last 168 hours. No results for input(s): AMMONIA in the last 168 hours.  ABG    Component Value Date/Time   HCO3 27.2 01/23/2024 2300   TCO2 17 (L) 11/14/2021 0718    O2SAT 81.4 01/23/2024 2300     Coagulation Profile: Recent Labs  Lab 01/23/24 0343 01/23/24 1859  INR 2.1* 2.2*    Cardiac Enzymes: No results for input(s): CKTOTAL, CKMB, CKMBINDEX, TROPONINI in the last 168 hours.  HbA1C: Hgb A1c MFr Bld  Date/Time Value Ref Range Status  01/23/2024 04:30 AM 5.9 (H) 4.8 - 5.6 % Final    Comment:    (NOTE) Diagnosis of Diabetes The following HbA1c ranges recommended by the American Diabetes Association (ADA) may be used as an aid in the diagnosis of diabetes mellitus.  Hemoglobin             Suggested A1C NGSP%              Diagnosis  <5.7                   Non Diabetic  5.7-6.4                Pre-Diabetic  >6.4                   Diabetic  <7.0                   Glycemic control for                       adults with diabetes.    01/23/2024 03:23 AM 5.7 (H) 4.8 - 5.6 % Final    Comment:    (NOTE) Diagnosis of Diabetes The following HbA1c ranges recommended by the American Diabetes Association (ADA) may be used as an aid in the diagnosis of diabetes mellitus.  Hemoglobin             Suggested A1C NGSP%              Diagnosis  <5.7                   Non Diabetic  5.7-6.4                Pre-Diabetic  >6.4                   Diabetic  <7.0                   Glycemic control for  adults with diabetes.      CBG: Recent Labs  Lab 01/24/24 2058 01/25/24 0749 01/25/24 1155 01/25/24 1602 01/25/24 2147  GLUCAP 126* 137* 136* 87 129*    Review of Systems: Positives in BOLD   Gen: Denies fever, chills, weight change, fatigue, night sweats HEENT: Denies blurred vision, double vision, hearing loss, tinnitus, sinus congestion, rhinorrhea, sore throat, neck stiffness, dysphagia PULM: Denies shortness of breath, cough, sputum production, hemoptysis, wheezing CV: Denies chest pain, edema, orthopnea, paroxysmal nocturnal dyspnea, palpitations GI: abdominal discomfort, nausea, vomiting, diarrhea,  hematochezia, melena, constipation, change in bowel habits GU: Denies dysuria, hematuria, polyuria, oliguria, urethral discharge Endocrine: Denies hot or cold intolerance, polyuria, polyphagia or appetite change Derm: Denies rash, dry skin, scaling or peeling skin change Heme: Denies easy bruising, bleeding, bleeding gums Neuro: Denies headache, numbness, weakness, slurred speech, loss of memory or consciousness  Past Medical History:  He,  has a past medical history of Anxiety, Arthritis, Depression, Diabetes mellitus without complication (HCC), End stage renal disease (HCC), Hypertension, Pneumonia, and Renal disorder.   Surgical History:   Past Surgical History:  Procedure Laterality Date   A/V FISTULAGRAM Left 06/08/2023   Procedure: A/V Fistulagram;  Surgeon: Jama Cordella MATSU, MD;  Location: [facility];  Service: Cardiovascular;  Laterality: Left;   A/V SHUNT INTERVENTION Left 09/29/2023   Procedure: A/V SHUNT INTERVENTION;  Surgeon: Marea Selinda RAMAN, MD;  Location: [facility];  Service: Cardiovascular;  Laterality: Left;   APPLICATION OF WOUND VAC Right 11/14/2021   Procedure: APPLICATION OF WOUND VAC;  Surgeon: Jama Cordella MATSU, MD;  Location: [facility] OR[facility];  Service: Vascular;  Laterality: Right;   AV FISTULA PLACEMENT Left 11/14/2021   Procedure: ARTERIOVENOUS (AV) FISTULA CREATION ( BRACHIAL CEPHALIC);  Surgeon: Jama Cordella MATSU, MD;  Location: [facility] OR[facility];  Service: Vascular;  Laterality: Left;     Social History:   reports that he has never smoked. He has never used smokeless tobacco. He reports that he does not currently use drugs. He reports that he does not drink alcohol.   Family History:  His family history is not on file.   Allergies Allergies[1]   Home Medications  Prior to Admission medications  Medication Sig Start Date End Date Taking? Authorizing Provider  acetaminophen  (TYLENOL ) 325 MG tablet Take 650 mg by mouth every 6 (six) hours as needed.    Yes [provider]  ALPRAZolam  (XANAX ) 0.5 MG tablet Take 0.5 mg by mouth 3 (three) times daily as needed for sleep. 02/05/21  Yes [provider]  amiodarone  (PACERONE ) 200 MG tablet Take 2 tablets (400 mg total) by mouth 2 (two) times daily for 7 days, THEN 1 tablet (200 mg total) daily. 07/24/23 01/22/24 Yes Sreenath, Sudheer B, MD  apixaban  (ELIQUIS ) 2.5 MG TABS tablet Take 1 tablet (2.5 mg total) by mouth 2 (two) times daily. 07/24/23 01/23/24 Yes Sreenath, Sudheer B, MD  Cholecalciferol  125 MCG (5000 UT) TABS Take 5,000 Units by mouth daily.   Yes [provider]  clopidogrel  (PLAVIX ) 75 MG tablet Take 1 tablet (75 mg total) by mouth daily. 06/09/23  Yes Schnier, Cordella MATSU, MD  diclofenac  Sodium (VOLTAREN ) 1 % GEL Apply 2 g topically 4 (four) times daily. 07/24/23  Yes Sreenath, Sudheer B, MD  Docusate Sodium  (DSS) 100 MG CAPS Take 100 mg by mouth at bedtime.   Yes [provider]  donepezil  (ARICEPT ) 10 MG tablet Take 10 mg by mouth at bedtime. 05/14/21  Yes [provider]  finasteride  (PROSCAR ) 5 MG tablet Take 1 tablet (5 mg total) by mouth daily. 08/12/19  Yes Stoioff, Glendia BROCKS, MD  lidocaine -prilocaine  (EMLA ) cream Apply 1 Application topically as needed (Dialysis). 08/18/23  Yes [provider]  metoprolol  succinate (TOPROL -XL) 25 MG 24 hr tablet Take 0.5 tablets (12.5 mg total) by mouth daily. 07/24/23 01/22/24 Yes Sreenath, Sudheer B, MD  tamsulosin  (FLOMAX ) 0.4 MG CAPS capsule Take 1 capsule (0.4 mg total) by mouth daily. 08/12/19  Yes Stoioff, Glendia BROCKS, MD  enalapril  (VASOTEC ) 20 MG tablet Take 20 mg by mouth 2 (two) times daily. Patient not taking: Reported on 01/20/2024 08/12/21   [provider]  furosemide  (LASIX ) 40 MG tablet Take 1 tablet (40 mg total) by mouth every other day. Patient not taking: Reported on 01/20/2024 07/25/23   Jhonny Calvin NOVAK, MD  glipiZIDE  (GLUCOTROL  XL) 5 MG 24 hr tablet Take by mouth. Patient not taking:  Reported on 01/20/2024 12/01/11   [provider]  LOKELMA 10 g PACK packet Take 1 packet by mouth daily. Patient not taking: Reported on 01/20/2024 07/12/23   [provider]  sodium bicarbonate  650 MG tablet Take 650 mg by mouth 2 (two) times daily. Patient not taking: Reported on 01/20/2024 06/16/23   [provider]     Critical care time: 40 minutes      Inge Lecher, AGACNP-BC [facility] Prefer epic messenger for cross cover needs If after hours, please call E-link            [1] No Known Allergies  "

## 2024-01-26 NOTE — Consult Note (Signed)
 "   Chief Complaint:  Splenic Fluid collection concerning for abscess vs infarct  Procedure: Percutaneous Aspiration with possible Drain Placement  Referring Provider(s): Arthea Platt, PA-C  Supervising Physician: Karalee Beat  Patient Status: [facility] - In-pt  History of Present Illness: Jeffery Joseph is a 84 y.o. male with a history of dementia on Aricept , ESRD on HD, DM, HTN, A-fib on Eliquis , chronic anemia, and recent flu infection who presented to the ED on 1/10 with complaints of worsening shortness of breath and weakness despite having previously taken Tamiflu . He was admitted for functional decline and chest pain in the setting of elevated Troponin. Overnight on 1/11, patient was noted to be increasingly lethargic and hypotensive. He was started on IV levophed  and transferred to the ICU where he has remained on pressors. WBC minimally elevated to 11.3 on 1/13 and blood cultures from 1/11 positive for enterococcus faecalis. Due to infection concern patient underwent CT A/P on 1/13 which revealed:  Abnormal hypodense lesions in the spleen measuring up to 10.5 x 4.5 x 4.0 cm (volume = 99 cm\S\3), not present on prior exam, raising concern for splenic abscesses or hematomas.  IR was subsequently consulted for possible aspiration vs drain placement due to splenic abscess. Imaging was reviewed by Dr. Karalee who recommended CTA to rule out concern for bleed/infarct. CTA completed and demonstrating:  Oblong low-attenuation fluid collection in the subcapsular periphery of the  spleen extending from cranial to caudal. There is a second smaller  wedge-shaped and less well-defined region of decreased enhancement in the inferior and anterior aspect of the splenic tip. Overall findings are most suggestive of a combination of acute and subacute splenic infarct. No peripheral enhancement, hyperemia or internal gas to suggest the presence of infection.  Case was again reviewed by Dr. Karalee  who is agreeable to cautiously move forward with splenic aspiration with possible drain placement.   Patient is currently resting in bed in no acute distress with his daughter at the bedside. He is currently without concern. Denies any chest pain, shortness of breath, fevers/chills, abdominal pain, or nausea/vomiting. NPO since midnight. Last dose of Eliquis  and Plavix  was prior to arrival on 1/10. Team attempted to wean from levo earlier this morning, leading to worsening hypotension. He will remain on levo for the procedure per RN. All questions and concerns answered at the bedside.   Patient is Full Code for the procedure per family   Past Medical History:  Diagnosis Date   Anxiety    Arthritis    Depression    Diabetes mellitus without complication (HCC)    End stage renal disease (HCC)    Hypertension    Pneumonia    Renal disorder     Past Surgical History:  Procedure Laterality Date   A/V FISTULAGRAM Left 06/08/2023   Procedure: A/V Fistulagram;  Surgeon: Jama Cordella MATSU, MD;  Location: [facility] INVASIVE CV [facility];  Service: Cardiovascular;  Laterality: Left;   A/V SHUNT INTERVENTION Left 09/29/2023   Procedure: A/V SHUNT INTERVENTION;  Surgeon: Marea Selinda RAMAN, MD;  Location: [facility] INV[facility] CV [facility];  Service: Cardiovascular;  Laterality: Left;   APPLICATION OF WOUND VAC Right 11/14/2021   Procedure: APPLICATION OF WOUND VAC;  Surgeon: Jama Cordella MATSU, MD;  Location: [facility] ORS;  Service: Vascular;  Laterality: Right;   AV FISTULA PLACEMENT Left 11/14/2021   Procedure: ARTERIOVENOUS (AV) FISTULA CREATION ( BRACHIAL CEPHALIC);  Surgeon: Jama Cordella MATSU, MD;  Location: [facility] ORS;  Service: Vascular;  Laterality: Left;  Allergies: Patient has no known allergies.  Medications: Prior to Admission medications  Medication Sig Start Date End Date Taking? Authorizing Provider  acetaminophen  (TYLENOL ) 325 MG tablet Take 650 mg by mouth every 6 (six) hours as needed.   Yes [provider]  ALPRAZolam  (XANAX ) 0.5 MG tablet Take 0.5 mg by mouth 3 (three) times daily as needed for sleep. 02/05/21  Yes [provider]  amiodarone  (PACERONE ) 200 MG tablet Take 2 tablets (400 mg total) by mouth 2 (two) times daily for 7 days, THEN 1 tablet (200 mg total) daily. 07/24/23 01/22/24 Yes Sreenath, Sudheer B, MD  apixaban  (ELIQUIS ) 2.5 MG TABS tablet Take 1 tablet (2.5 mg total) by mouth 2 (two) times daily. 07/24/23 01/23/24 Yes Sreenath, Sudheer B, MD  Cholecalciferol  125 MCG (5000 UT) TABS Take 5,000 Units by mouth daily.   Yes [provider]  clopidogrel  (PLAVIX ) 75 MG tablet Take 1 tablet (75 mg total) by mouth daily. 06/09/23  Yes Schnier, Cordella MATSU, MD  diclofenac  Sodium (VOLTAREN ) 1 % GEL Apply 2 g topically 4 (four) times daily. 07/24/23  Yes Sreenath, Sudheer B, MD  Docusate Sodium  (DSS) 100 MG CAPS Take 100 mg by mouth at bedtime.   Yes [provider]  donepezil  (ARICEPT ) 10 MG tablet Take 10 mg by mouth at bedtime. 05/14/21  Yes [provider]  finasteride  (PROSCAR ) 5 MG tablet Take 1 tablet (5 mg total) by mouth daily. 08/12/19  Yes Stoioff, Glendia BROCKS, MD  lidocaine -prilocaine  (EMLA ) cream Apply 1 Application topically as needed (Dialysis). 08/18/23  Yes [provider]  metoprolol  succinate (TOPROL -XL) 25 MG 24 hr tablet Take 0.5 tablets (12.5 mg total) by mouth daily. 07/24/23 01/22/24 Yes Sreenath, Sudheer B, MD  tamsulosin  (FLOMAX ) 0.4 MG CAPS capsule Take 1 capsule (0.4 mg total) by mouth daily. 08/12/19  Yes Stoioff, Glendia BROCKS, MD  enalapril  (VASOTEC ) 20 MG tablet Take 20 mg by mouth 2 (two) times daily. Patient not taking: Reported on 01/20/2024 08/12/21   [provider]  furosemide  (LASIX ) 40 MG tablet Take 1 tablet (40 mg total) by mouth every other day. Patient not taking: Reported on 01/20/2024 07/25/23   Jhonny Calvin NOVAK, MD  glipiZIDE  (GLUCOTROL  XL) 5 MG 24 hr tablet Take by mouth. Patient not taking: Reported on  01/20/2024 12/01/11   [provider]  LOKELMA 10 g PACK packet Take 1 packet by mouth daily. Patient not taking: Reported on 01/20/2024 07/12/23   [provider]  sodium bicarbonate  650 MG tablet Take 650 mg by mouth 2 (two) times daily. Patient not taking: Reported on 01/20/2024 06/16/23   [provider]     History reviewed. No pertinent family history.  Social History   Socioeconomic History   Marital status: Married    Spouse name: ,Loraine   Number of children: Not on file   Years of education: Not on file   Highest education level: Not on file  Occupational History   Not on file  Tobacco Use   Smoking status: Never   Smokeless tobacco: Never  Vaping Use   Vaping status: Never Used  Substance and Sexual Activity   Alcohol use: No   Drug use: Not Currently   Sexual activity: Not Currently  Other Topics Concern   Not on file  Social History Narrative   Not on file   Social Drivers of Health   Tobacco Use: Low Risk (01/22/2024)   Patient History    Smoking Tobacco Use: Never  Smokeless Tobacco Use: Never    Passive Exposure: Not on file  Recent Concern: Tobacco Use - Medium Risk (12/01/2023)   [facility]   Patient History    Passive Exposure: Not on file    Smokeless Tobacco Use: Never    Smoking Tobacco Use: Former  Physicist, Medical Strain: Low Risk  (12/01/2023)   Received from [facility]   Overall Financial Resource Strain (CARDIA)    Difficulty of Paying Living Expenses: Not hard at all  Food Insecurity: Patient Unable To Answer (01/24/2024)   Epic    Worried About Programme Researcher, Broadcasting/film/video in the Last Year: Patient unable to answer    Ran Out of Food in the Last Year: Patient unable to answer  Transportation Needs: Patient Unable To Answer (01/24/2024)   Epic    Lack of Transportation (Medical): Patient unable to answer    Lack of Transportation (Non-Medical): Patient unable to answer   Physical Activity: Not on file  Stress: Not on file  Social Connections: Unknown (01/24/2024)   Social Connection and Isolation Panel    Frequency of Communication with Friends and Family: Patient unable to answer    Frequency of Social Gatherings with Friends and Family: Patient unable to answer    Attends Religious Services: Patient unable to answer    Active Member of Clubs or Organizations: Not on file    Attends Banker Meetings: Patient unable to answer    Marital Status: Patient unable to answer  Depression (PHQ2-9): Not on file  Alcohol Screen: Not on file  Housing: Unknown (01/24/2024)   Epic    Unable to Pay for Housing in the Last Year: Patient unable to answer    Number of Times Moved in the Last Year: 0    Homeless in the Last Year: Patient unable to answer  Utilities: Patient Unable To Answer (01/24/2024)   Epic    Threatened with loss of utilities: Patient unable to answer  Health Literacy: Not on file    Review of Systems Patient denies any headache, chest pain, shortness of breath, abdominal pain, N/V, or fever/chills. All other systems are negative.   Vital Signs: BP (!) 109/41   Pulse (!) 114   Temp 98 F (36.7 C) (Oral)   Resp 15   Ht 6' 5 (1.956 m)   Wt 220 lb 14.4 oz (100.2 kg)   SpO2 95%   BMI 26.19 kg/m   Advance Care Plan: The advanced care plan/surrogate decision maker was discussed at the time of visit and documented in the medical record.    Physical Exam Vitals reviewed.  Constitutional:      Appearance: Normal appearance.  HENT:     Mouth/Throat:     Mouth: Mucous membranes are moist.     Pharynx: Oropharynx is clear.  Cardiovascular:     Rate and Rhythm: Regular rhythm. Tachycardia present.     Heart sounds: Normal heart sounds.  Pulmonary:     Effort: Pulmonary effort is normal.     Breath sounds: Normal breath sounds.  Abdominal:     General: Abdomen is flat.     Palpations: Abdomen is soft.     Tenderness: There  is no abdominal tenderness.  Skin:    General: Skin is warm and dry.  Neurological:     Mental Status: He is alert. Mental status is at baseline.  Psychiatric:        Behavior: Behavior normal.  Imaging: CT Angio Abd/Pel w/ and/or w/o Result Date: 01/26/2024 CLINICAL DATA:  Splenic fluid collection. Possible infarct versus abscess. EXAM: CTA ABDOMEN AND PELVIS WITHOUT AND WITH CONTRAST TECHNIQUE: Multidetector CT imaging of the abdomen and pelvis was performed using the standard protocol during bolus administration of intravenous contrast. Multiplanar reconstructed images and MIPs were obtained and reviewed to evaluate the vascular anatomy. RADIATION DOSE REDUCTION: This exam was performed according to the departmental dose-optimization program which includes automated exposure control, adjustment of the mA and/or kV according to patient size and/or use of iterative reconstruction technique. CONTRAST:  OMNIPAQUE  IOHEXOL  350 MG/ML SOLN COMPARISON:  CT scan without contrast performed yesterday 01/25/2024 FINDINGS: VASCULAR Aorta: Normal caliber aorta without aneurysm, dissection, vasculitis or significant stenosis. Scattered atherosclerotic vascular calcifications. 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 The left hepatic artery is replaced to the left gastric artery. No aneurysm or dissection. SMA: Patent without evidence of aneurysm, dissection, vasculitis or significant stenosis. Renals: Solitary renal arteries bilaterally. Calcified plaque results in high-grade stenosis of the left renal artery. The right renal artery is patent but relatively small in caliber. No aneurysm or changes of FMD. IMA: Patent without evidence of aneurysm, dissection, vasculitis or significant stenosis. Inflow: Patent without evidence of aneurysm, dissection, vasculitis or significant stenosis. Proximal Outflow: Bilateral common femoral and  visualized portions of the superficial and profunda femoral arteries are patent without evidence of aneurysm, dissection, vasculitis or significant stenosis. Veins: Widely patent portal, splenic and visceral veins. Review of the MIP images confirms the above findings. NON-VASCULAR Lower chest: Dependent atelectasis bilaterally. Cardiomegaly. Calcified atherosclerotic plaque along the coronary arteries. Thickening calcified aortic valve. Hepatobiliary: Normal hepatic contour and morphology. No discrete hepatic lesions. Normal appearance of the gallbladder. No intra or extrahepatic biliary ductal dilatation. Pancreas: Unremarkable. No pancreatic ductal dilatation or surrounding inflammatory changes. Spleen: Oblong rounded hypoechoic collection in the subcapsular aspect of the spleen extends from cranial to caudal and measures 12.0 x 4.2 x 4.4 cm. The collection is circumscribed. No evidence of peripheral enhancement or hyperemia. There is a second smaller wedge-shaped focus of ill-defined low attenuation in the anterior lower pole of the spleen. Overall, these findings suggest a combination of acute and subacute splenic infarcts. Adrenals/Urinary Tract: Normal adrenal glands. 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 Similar findings have been noted on multiple prior examinations. Stomach/Bowel: Stomach is within normal limits. Appendix appears normal. No evidence of bowel wall thickening, distention, or inflammatory changes. Lymphatic: No suspicious lymphadenopathy. Reproductive: Prostatomegaly. Other: No abdominal wall hernia or abnormality. No abdominopelvic ascites. Musculoskeletal: No acute fracture or aggressive appearing lytic or blastic osseous lesion. Lower lumbar degenerative disc disease and facet arthropathy. IMPRESSION: 1. Oblong low-attenuation fluid collection in the subcapsular periphery of the spleen extending from cranial to caudal. There is a second smaller  wedge-shaped and less well-defined region of decreased enhancement in the inferior and anterior aspect of the splenic tip. Overall findings are most suggestive of a combination of acute and subacute splenic infarct. No peripheral enhancement, hyperemia or internal gas to suggest the presence of infection. 2. 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 3. 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 4. Extensive cystic changes of both kidneys with cysts of variable density and complexity as seen on prior imaging. As before, outpatient MRI with gadolinium contrast could be considered to exclude neoplasm if clinically warranted. 5. Additional  ancillary findings as above. Electronically Signed   By: Wilkie Lent M.D.   On: 01/26/2024 08:46   MR BRAIN WO CONTRAST Result Date: 01/25/2024 EXAM: MRI BRAIN WITHOUT CONTRAST 01/25/2024 02:21:13 PM TECHNIQUE: Multiplanar multisequence MRI of the head/brain was performed without the administration of intravenous contrast. COMPARISON: None available. CLINICAL HISTORY: Mental status change, unknown cause. The patient presents with a mental status change of unknown cause. 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 T2 hyperintensities in the cerebral white matter bilaterally are nonspecific but compatible with mild chronic small vessel ischemic disease. There is mild to moderate cerebral atrophy. No intracranial hemorrhage, mass, midline shift, hydrocephalus, or extra-axial fluid collection is identified. Major intracranial vascular flow voids are preserved. ORBITS: No significant abnormality. SINUSES AND MASTOIDS: No significant abnormality. BONES AND SOFT TISSUES: Normal marrow signal. No significant soft tissue abnormality. IMPRESSION: 1. 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 2. Mild chronic small vessel ischemic disease.  Electronically signed by: Dasie Hamburg MD 01/25/2024 04:40 PM EST RP Workstation: [id number]   ECHOCARDIOGRAM COMPLETE Result Date: 01/25/2024    ECHOCARDIOGRAM REPORT   Patient Name:   JODEY BURBANO Date of Exam: 01/25/2024 Medical Rec #:  [id number]     Height:       77.0 in Accession #:    [id number]    Weight:       219.1 lb Date of Birth:  10/21/1940     BSA:          2.325 m Patient Age:    83 years      BP:           111/47 mmHg Patient Gender: M             HR:           113 bpm. Exam Location:  [facility] Procedure: 2D Echo, Cardiac Doppler, Color Doppler, 3D Echo and Strain Analysis            (Both Spectral and Color Flow Doppler were utilized during            procedure). Indications:     Shock R57.9  History:         Patient has prior history of Echocardiogram examinations, most                  recent 07/18/2023. Risk Factors:Diabetes and Hypertension.  Sonographer:     Christopher Furnace Referring Phys:  [id number] LONELL KANDICE MOOSE Diagnosing Phys: Cara JONETTA Lovelace MD  Sonographer Comments: Global longitudinal strain was attempted. IMPRESSIONS  1. Left ventricular ejection fraction, by estimation, is 45 to 50%. The left ventricle has mildly decreased function. The left ventricle demonstrates global hypokinesis. The left ventricular internal cavity size was mildly dilated. There is mild asymmetric left ventricular hypertrophy. Left ventricular diastolic parameters are consistent with Grade II diastolic dysfunction (pseudonormalization). The average left ventricular global longitudinal strain is 6.7 %. The global longitudinal strain is abnormal.  2. Right ventricular systolic function is low normal. The right ventricular size is mildly enlarged.  3. Left atrial size was mildly dilated.  4. The mitral valve is grossly normal. Mild to moderate mitral valve regurgitation.  5. Tricuspid valve regurgitation is mild to moderate.  6. The aortic valve is calcified. Aortic valve regurgitation is mild. Mild aortic valve stenosis.  FINDINGS  Left Ventricle: Left ventricular ejection fraction, by estimation, is 45 to 50%. The left ventricle has  mildly decreased function. The left ventricle demonstrates global hypokinesis. The average left ventricular global longitudinal strain is 6.7 %. Strain was performed and the global longitudinal strain is abnormal. The left ventricular internal cavity size was mildly dilated. There is mild asymmetric left ventricular hypertrophy. Left ventricular diastolic parameters are consistent with Grade II diastolic dysfunction (pseudonormalization). Right Ventricle: The right ventricular size is mildly enlarged. No increase in right ventricular wall thickness. Right ventricular systolic function is low normal. Left Atrium: Left atrial size was mildly dilated. Right Atrium: Right atrial size was normal in size. Pericardium: There is no evidence of pericardial effusion. Mitral Valve: The mitral valve is grossly normal. Mild to moderate mitral valve regurgitation. MV peak gradient, 12.4 mmHg. The mean mitral valve gradient is 6.0 mmHg. Tricuspid Valve: The tricuspid valve is normal in structure. Tricuspid valve regurgitation is mild to moderate. Aortic Valve: The aortic valve is calcified. Aortic valve regurgitation is mild. Aortic regurgitation PHT measures 206 msec. Mild aortic stenosis is present. Aortic valve mean gradient measures 16.2 mmHg. Aortic valve peak gradient measures 30.7 mmHg. Aortic valve area, by VTI measures 1.51 cm. Pulmonic Valve: The pulmonic valve was not well visualized. Pulmonic valve regurgitation is not visualized. Aorta: The aortic root was not well visualized. IAS/Shunts: No atrial level shunt detected by color flow Doppler. Additional Comments: 3D was performed not requiring image post processing on an independent workstation and was abnormal.  LEFT VENTRICLE PLAX 2D LVIDd:         5.20 cm      Diastology LVIDs:         3.90 cm      LV e' medial:    3.59 cm/s LV PW:         1.10 cm       LV E/e' medial:  39.0 LV IVS:        1.40 cm      LV e' lateral:   8.27 cm/s LVOT diam:     2.00 cm      LV E/e' lateral: 16.9 LV SV:         62 LV SV Index:   26           2D Longitudinal Strain LVOT Area:     3.14 cm     2D Strain GLS (A4C):   5.9 % LV IVRT:       48 msec      2D Strain GLS (A3C):   6.1 %                             2D Strain GLS (A2C):   8.2 %                             2D Strain GLS Avg:     6.7 % LV Volumes (MOD) LV vol d, MOD A2C: 147.0 ml LV vol d, MOD A4C: 112.0 ml LV vol s, MOD A2C: 83.5 ml LV vol s, MOD A4C: 72.4 ml LV SV MOD A2C:     63.5 ml LV SV MOD A4C:     112.0 ml LV SV MOD BP:      47.7 ml RIGHT VENTRICLE RV Basal diam:  4.00 cm RV Mid diam:    3.50 cm RV S prime:     11.90 cm/s TAPSE (M-mode): 1.8 cm LEFT ATRIUM  Index        RIGHT ATRIUM           Index LA diam:        4.00 cm 1.72 cm/m   RA Area:     15.20 cm LA Vol (A2C):   97.8 ml 42.06 ml/m  RA Volume:   31.10 ml  13.37 ml/m LA Vol (A4C):   91.0 ml 39.13 ml/m LA Biplane Vol: 96.0 ml 41.28 ml/m  AORTIC VALVE AV Area (Vmax):    1.27 cm AV Area (Vmean):   1.28 cm AV Area (VTI):     1.51 cm AV Vmax:           277.25 cm/s AV Vmean:          183.000 cm/s AV VTI:            0.409 m AV Peak Grad:      30.7 mmHg AV Mean Grad:      16.2 mmHg LVOT Vmax:         112.00 cm/s LVOT Vmean:        74.500 cm/s LVOT VTI:          0.196 m LVOT/AV VTI ratio: 0.48 AI PHT:            206 msec  AORTA Ao Root diam: 2.70 cm MITRAL VALVE                  TRICUSPID VALVE MV Area (PHT): 5.31 cm       TR Peak grad:   26.4 mmHg MV Area VTI:   2.15 cm       TR Vmax:        257.00 cm/s MV Peak grad:  12.4 mmHg MV Mean grad:  6.0 mmHg       SHUNTS MV Vmax:       1.76 m/s       Systemic VTI:  0.20 m MV Vmean:      119.0 cm/s     Systemic Diam: 2.00 cm MV Decel Time: 143 msec MR Peak grad:    108.6 mmHg MR Mean grad:    74.0 mmHg MR Vmax:         521.00 cm/s MR Vmean:        418.0 cm/s MR PISA:         4.02 cm MR PISA Eff ROA: 25 mm MR  PISA Radius:  0.80 cm MV E velocity: 140.00 cm/s MV A velocity: 114.00 cm/s MV E/A ratio:  1.23 Dwayne D Callwood MD Electronically signed by Cara JONETTA Lovelace MD Signature Date/Time: 01/25/2024/2:49:15 PM    Final    CT ABDOMEN PELVIS WO CONTRAST Result Date: 01/25/2024 EXAM: CT ABDOMEN AND PELVIS WITHOUT CONTRAST 01/25/2024 11:06:03 AM TECHNIQUE: CT of the abdomen and pelvis was performed without the administration of intravenous contrast. Multiplanar reformatted images are provided for review. Automated exposure control, iterative reconstruction, and/or weight-based adjustment of the mA/kV was utilized to reduce the radiation dose to as low as reasonably achievable. COMPARISON: 09/07/2023 CLINICAL HISTORY: Sepsis. FINDINGS: LOWER CHEST: Coronary and aortic atherosclerosis with moderate cardiomegaly. Trace bilateral pleural effusions with mild atelectasis in the lung bases. Hazy airspace opacity medially in the left lower lobe on image 9 series 4 is nonspecific, early bronchopneumonia not excluded. LIVER: The liver is unremarkable. GALLBLADDER AND BILE DUCTS: Contracted gallbladder. No biliary ductal dilatation. SPLEEN: Abnormal hypodense lesions in the spleen measuring up to 10.5 x 4.5 x 4.0 cm (volume = 99  cm\S\3), not present on 09/07/2023. Splenic abscess is not excluded. PANCREAS: No acute abnormality. ADRENAL GLANDS: No acute abnormality. KIDNEYS, URETERS AND BLADDER: Scattered hypodense and hyperdense renal lesions similar to previous, suspicious for polycystic kidney disease. Enhancement not assessed on today's noncontrast examination. Overall morphology similar to the 09/07/2023 exam. No stones in the kidneys or ureters. No hydronephrosis. No perinephric or periureteral stranding. Urinary bladder is unremarkable. GI AND BOWEL: Stomach demonstrates no acute abnormality. Stable dilated appendix at 9 mm diameter on image 73 series 2 with internal high density suspicious for appendicolith, similar  appearance on 09/07/2023. Given the chronic prominence, an underlying appendiceal mass cannot be readily excluded. No current periappendiceal inflammatory findings. There is no bowel obstruction. PERITONEUM AND RETROPERITONEUM: No ascites. No free air. New nonspecific presacral edema. VASCULATURE: Aorta is normal in caliber. Atherosclerosis atheromatous plaque at the origin of the superior mesenteric artery. LYMPH NODES: No lymphadenopathy. REPRODUCTIVE ORGANS: Stable prostatomegaly. BONES AND SOFT TISSUES: Low level subcutaneous edema along the flanks. Bridging spurring of both sacroiliac joints. 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 Grade 1 degenerative anterolisthesis at L4-L5 with lower lumbar spondylosis and degenerative disc disease and suspected bilateral foraminal stenosis at L3-L4 and L4-L5 with borderline bilateral foraminal stenosis at L5-S1. A small umbilical hernia contains adipose tissue. IMPRESSION: 1. Abnormal hypodense lesions in the spleen measuring up to 10.5 x 4.5 x 4.0 cm (volume = 99 cm\S\3), not present on prior exam, raising concern for splenic abscesses or hematomas. 2. Stable dilated appendix at 9 mm diameter with internal high density suspicious for appendicolith. Given the chronic prominence, an underlying appendiceal mass cannot be readily excluded. 3. Hazy airspace opacity medially in the left lower lobe, nonspecific, with early bronchopneumonia not excluded. 4. Scattered hypodense and hyperdense renal lesions are suspicious for polycystic kidney disease. 5. New nonspecific presacral edema. 6. 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 7. Grade 1 degenerative anterolisthesis at L4-5 with lower lumbar spondylosis and degenerative disc disease and suspected bilateral foraminal stenosis at L3-4 and L4-5 with borderline bilateral foraminal stenosis at L5-S1. 8. Coronary and aortic atherosclerosis with moderate cardiomegaly. 9. Trace bilateral  pleural effusions with mild atelectasis in the lung bases. 10. Stable prostatomegaly. Impression #1 will be telephoned to the referring provider or the referring provider's representative by professional radiology assistant [facility]) personnel, with communication documented in the clario dashboard. Electronically signed by: Ryan Salvage MD MD 01/25/2024 11:37 AM EST RP Workstation: [id number]   DG Chest 1 View Result Date: 01/23/2024 EXAM: 1 VIEW(S) XRAY OF THE CHEST 01/23/2024 10:37:00 PM COMPARISON: 01/22/2024 CLINICAL HISTORY: Lethargy FINDINGS: LINES, TUBES AND DEVICES: Left axillary stent noted. LUNGS AND PLEURA: Increased interstitial markings, lower lung/infrahilar predominant, favoring mild interstitial edema although multifocal infection/pneumonia is also possible. No pleural effusion. No pneumothorax. HEART AND MEDIASTINUM: Stable cardiomegaly. Aortic atherosclerosis. BONES AND SOFT TISSUES: No acute osseous abnormality. IMPRESSION: 1. Increased lower lung/infrahilar predominant interstitial markings, favoring mild interstitial edema over multifocal infection/pneumonia. Electronically signed by: Pinkie Pebbles MD MD 01/23/2024 10:41 PM EST RP Workstation: [id number]   CT Chest Wo Contrast Result Date: 01/22/2024 CLINICAL DATA:  Respiratory illness, nondiagnostic xray, chest pressure, short of breath EXAM: CT CHEST WITHOUT CONTRAST TECHNIQUE: Multidetector CT imaging of the chest was performed following the standard protocol without IV contrast. RADIATION DOSE REDUCTION: This exam was performed according to the departmental dose-optimization program which includes automated exposure control, adjustment of the mA and/or kV according to patient size and/or use of iterative reconstruction technique. COMPARISON:  01/22/2024, 09/07/2023  FINDINGS: Cardiovascular: Unenhanced imaging of the heart demonstrates cardiomegaly without pericardial effusion. Normal caliber of the thoracic aorta. Atherosclerosis  of the aorta and coronary vasculature. Assessment of the vascular lumen cannot be performed without intravenous contrast. Mediastinum/Nodes: No enlarged mediastinal or axillary lymph nodes. Thyroid gland, trachea, and esophagus demonstrate no significant findings. Lungs/Pleura: No acute airspace disease, effusion, or pneumothorax. Dependent hypoventilatory changes are noted. Central airways are patent. Upper Abdomen: Since the prior abdominal CT 09/07/2023, there has been development of borderline splenomegaly measuring 12.7 x 11.9 x 6.8 cm. Within the superolateral aspect of the spleen there is a new indeterminate 6.1 x 6.8 x 4.6 cm hypodensity. Musculoskeletal: No acute or destructive bony abnormalities. Reconstructed images demonstrate no additional findings. IMPRESSION: 1. Cardiomegaly.  No pericardial effusion. 2. No acute airspace disease. 3. Borderline splenomegaly and indeterminate 6.8 cm splenic hypodensity, which have developed in the interim since the 09/07/2023 CT. This is incompletely characterized on this exam, and if further evaluation is clinically indicated a contrasted CT or MRI of the abdomen could be considered. 4. Aortic Atherosclerosis (ICD10-I70.0). Coronary artery atherosclerosis. Electronically Signed   By: Ozell Daring M.D.   On: 01/22/2024 20:00   DG Chest 2 View Result Date: 01/22/2024 CLINICAL DATA:  Chest pain and shortness of breath. EXAM: CHEST - 2 VIEW COMPARISON:  07/19/2023. FINDINGS: The heart is enlarged and the mediastinal contour is within normal limits. Atherosclerotic calcification of the aorta is noted. Mild atelectasis or scarring is present at the left lung base. No consolidation, effusion, or pneumothorax is seen. No acute osseous abnormality. A vascular stent is noted in the axillary region on the left. IMPRESSION: No active cardiopulmonary disease. Electronically Signed   By: Leita Birmingham M.D.   On: 01/22/2024 18:17   VAS US  DUPLEX DIALYSIS ACCESS (AVF,  AVG) Result Date: 01/19/2024 DIALYSIS ACCESS Patient Name:  GRAEME MENEES  Date of Exam:   01/18/2024 Medical Rec #: [id number]      Accession #:    [id number] Date of Birth: 1940-12-28      Patient Gender: M Patient Age:   44 years Exam Location:  [facility] Procedure:      VAS US  DUPLEX DIALYSIS ACCESS (AVF, AVG) Referring Phys: SELINDA GU --------------------------------------------------------------------------------  Reason for Exam: Routine follow up. Access Site: Left Upper Extremity. Access Type: Brachial-cephalic AVF. History: 2023: Left brachial-cephalic AVF created;          06/08/23: Stent placed at cephalic / subclavian confluence;          09/2023: mid upper arm PTA and stent;. Performing Technologist: Elsie Churn RT, RDMS, RVT  Examination Guidelines: A complete evaluation includes B-mode imaging, spectral Doppler, color Doppler, and power Doppler as needed of all accessible portions of each vessel. Unilateral testing is considered an integral part of a complete examination. Limited examinations for reoccurring indications may be performed as noted.  Findings: +--------------------+----------+-----------------+--------+ AVF                 PSV (cm/s)Flow Vol (mL/min)Comments +--------------------+----------+-----------------+--------+ Native artery inflow   196          1496                +--------------------+----------+-----------------+--------+ AVF Anastomosis        418                              +--------------------+----------+-----------------+--------+  +---------------+----------+-------------+----------+-----------------------+ OUTFLOW VEIN   PSV (cm/s)Diameter (  cm)Depth (cm)       Describe         +---------------+----------+-------------+----------+-----------------------+ Subclavian vein   194                                                   +---------------+----------+-------------+----------+-----------------------+ Confluence        129                                     stent          +---------------+----------+-------------+----------+-----------------------+ Clavicle          139                                    stent          +---------------+----------+-------------+----------+-----------------------+ Shoulder          152                                    stent          +---------------+----------+-------------+----------+-----------------------+ Prox UA           133                                    stent          +---------------+----------+-------------+----------+-----------------------+ Mid UA            150                                    stent          +---------------+----------+-------------+----------+-----------------------+ Dist UA           325                           possible retained valve +---------------+----------+-------------+----------+-----------------------+ AC Fossa          119                                                   +---------------+----------+-------------+----------+-----------------------+  Summary: Patent left brachial-cephalic AVF with no focal hemodynamically significant velocity increases or internal vessel narrowing. Normal Flow Volume noted.  *See table(s) above for measurements and observations.  Diagnosing physician: Selinda Gu MD Electronically signed by Selinda Gu MD on 01/19/2024 at 7:24:41 AM.  --------------------------------------------------------------------------------   Final     Labs:  CBC: Recent Labs    01/24/24 0317 01/24/24 1005 01/25/24 0330 01/25/24 1309 01/26/24 0327  WBC 8.9  --  9.5 11.3* 10.5  HGB 8.2* 9.1* 8.7* 9.1* 8.6*  HCT 25.0* 27.7* 28.4* 28.6* 27.1*  PLT 202  --  185 202 193    COAGS: Recent Labs    07/17/23 2042 01/23/24 0343 01/23/24 0343 01/23/24 1859 01/24/24 0317 01/24/24 1419 01/24/24 2318 01/25/24 0802 01/26/24  0949  INR 1.3* 2.1*  --  2.2*  --   --   --   --  1.6*  APTT  --  48*   < > 49*  52* 47* 71* 79*  --    < > = values in this interval not displayed.    BMP: Recent Labs    01/24/24 0317 01/24/24 1554 01/25/24 0330 01/26/24 0327  NA 135 132* 130* 128*  K 3.7 3.6 3.3* 3.5  CL 97* 94* 93* 93*  CO2 25 27 27 26   GLUCOSE 120* 173* 147* 137*  BUN 52* 31* 35* 39*  CALCIUM  8.5* 8.3* 8.3* 8.3*  CREATININE 5.05* 3.37* 3.62* 3.77*  GFRNONAA 11* 17* 16* 15*    LIVER FUNCTION TESTS: Recent Labs    07/17/23 2042 07/18/23 0342 01/22/24 1745 01/24/24 0317 01/24/24 1554 01/25/24 0330  BILITOT 1.0  --  0.5 0.9  --  0.7  AST 23  --  73* 34  --  22  ALT 35  --  70* 48*  --  33  ALKPHOS 54  --  86 66  --  64  PROT 6.6  --  7.6 6.7  --  6.5  ALBUMIN  3.2*   < > 3.1* 2.7* 2.7* 2.6*   < > = values in this interval not displayed.    TUMOR MARKERS: No results for input(s): AFPTM, CEA, CA199, CHROMGRNA in the last 8760 hours.  Assessment and Plan:  Splenic Fluid Collection: Jeffery Joseph is a 84 y.o. male with a history of dementia, ESRD on HD, T2DM, chronic anemia, A-fib on Eliquis , and recent flu infection who was admitted to the ED for functional decline and chest pain with elevated troponin likely secondary to demand ischemia. Noted to have rising WBC count (minimal at 11.8) and positive blood cultures. Imaging demonstrating for splenic fluid collection concerning for possible abscess vs infarct. IR consulted for attempted aspiration vs drain placement. Case and imaging reviewed and approved by Dr. VEAR Lent. Procedure to be performed under moderate sedation.  -NPO since midnight -Restarted on levo with improvement in pressures -Continue to hold [facility] -INR 1.6; WBC 10.5 today < 11.3 yesterday; BUN/Cr 39/3.77 -Continue antibiotics per medicine team -IR will plan for cautious aspiration of splenic fluid collection with possible drain placement on 1/14 pending schedule availability   Risks and benefits of splenic aspiration vs possible drain placement discussed  with the patient including bleeding, infection, damage to adjacent structures, bowel perforation/fistula connection, and sepsis.  All of the patient's and daughter's questions were answered, daughter is agreeable to proceed. Consent signed and in CT.   Thank you for allowing our service to participate in LADARION MUNYON 's care.    Electronically Signed: Glennon CHRISTELLA Bal, PA-C   01/26/2024, 11:22 AM     I spent a total of 40 Minutes in face to face in clinical consultation, greater than 50% of which was counseling/coordinating care for possible splenic abscess with aspiration vs drain placement. "

## 2024-01-26 NOTE — Care Management Important Message (Signed)
 Important Message  Patient Details  Name: Jeffery Joseph MRN: [id number] Date of Birth: August 13, 1940   Important Message Given:  Yes - Medicare IM     Loran Fleet W, CMA 01/26/2024, 2:45 PM

## 2024-01-26 NOTE — Plan of Care (Signed)
" °  Problem: Coping: Goal: Ability to adjust to condition or change in health will improve Outcome: Progressing   Problem: Fluid Volume: Goal: Ability to maintain a balanced intake and output will improve Outcome: Progressing   Problem: Health Behavior/Discharge Planning: Goal: Ability to identify and utilize available resources and services will improve Outcome: Progressing   Problem: Clinical Measurements: Goal: Respiratory complications will improve Outcome: Progressing   Problem: Coping: Goal: Level of anxiety will decrease Outcome: Progressing   Problem: Elimination: Goal: Will not experience complications related to urinary retention Outcome: Progressing   Problem: Pain Managment: Goal: General experience of comfort will improve and/or be controlled Outcome: Progressing   Problem: Safety: Goal: Ability to remain free from injury will improve Outcome: Progressing   "

## 2024-01-26 NOTE — Progress Notes (Signed)
 "  Date of Admission:  01/22/2024     ID: Jeffery Joseph is a 84 y.o. male Principal Problem:   Chest pain Active Problems:   Chronic atrial fibrillation (HCC)   Type 2 diabetes mellitus with chronic kidney disease, without long-term current use of insulin  (HCC)   BPH (benign prostatic hyperplasia)   End-stage renal disease on hemodialysis (HCC)   Influenza   Pressure injury of skin   Bacteremia   Splenic abscess   Bacteremia due to Enterococcus    Subjective: Pt in bed Family in the room He had splenic drain placed today  Medications:   amiodarone   100 mg Oral Daily   Chlorhexidine  Gluconate Cloth  6 each Topical Daily   cholecalciferol   5,000 Units Oral Daily   diclofenac  Sodium  2 g Topical QID   docusate sodium   100 mg Oral QHS   donepezil   10 mg Oral QHS   finasteride   5 mg Oral Daily   insulin  aspart  0-15 Units Subcutaneous TID WC   insulin  aspart  0-5 Units Subcutaneous QHS   midodrine   10 mg Oral TID WC   oseltamivir   30 mg Oral Q M,W,F-1800   pantoprazole  (PROTONIX ) IV  40 mg Intravenous Daily   tamsulosin   0.4 mg Oral Daily    Objective: Vital signs in last 24 hours: Patient Vitals for the past 24 hrs:  BP Temp Temp src Pulse Resp SpO2 Weight  01/26/24 1015 (!) 114/41 -- -- (!) 116 17 96 % --  01/26/24 1000 (!) 109/39 -- -- (!) 113 20 97 % --  01/26/24 0945 (!) 103/39 -- -- (!) 113 18 95 % --  01/26/24 0930 (!) 96/36 -- -- (!) 113 20 95 % --  01/26/24 0915 (!) 98/46 -- -- (!) 112 15 96 % --  01/26/24 0900 (!) 93/38 -- -- (!) 113 17 95 % --  01/26/24 0845 (!) 89/37 -- -- (!) 112 11 95 % --  01/26/24 0830 (!) 108/44 -- -- (!) 116 18 95 % --  01/26/24 0815 (!) 105/42 -- -- (!) 115 16 96 % --  01/26/24 0800 (!) 99/44 98 F (36.7 C) Oral (!) 113 16 97 % --  01/26/24 0745 (!) 108/43 -- -- -- 13 -- --  01/26/24 0743 (!) 108/43 -- -- -- 15 -- --  01/26/24 0730 (!) 105/42 -- -- -- 19 -- --  01/26/24 0715 (!) 103/40 -- -- (!) 113 10 (!) 86 % --  01/26/24 0700  (!) 98/45 -- -- -- 16 -- --  01/26/24 0645 (!) 95/38 -- -- (!) 114 16 97 % --  01/26/24 0630 (!) 104/45 -- -- (!) 113 20 96 % --  01/26/24 0615 (!) 102/40 -- -- (!) 113 20 96 % --  01/26/24 0600 (!) 107/44 -- -- (!) 113 (!) 26 97 % --  01/26/24 0545 (!) 104/37 -- -- (!) 112 20 97 % --  01/26/24 0530 (!) 105/40 -- -- (!) 112 16 98 % --  01/26/24 0515 (!) 104/40 -- -- (!) 112 16 98 % --  01/26/24 0500 (!) 103/36 -- -- (!) 112 18 96 % 100.2 kg  01/26/24 0445 (!) 105/38 -- -- (!) 111 19 96 % --  01/26/24 0430 (!) 105/34 -- -- (!) 111 13 97 % --  01/26/24 0415 (!) 100/39 -- -- (!) 112 18 97 % --  01/26/24 0400 (!) 101/37 98.5 F (36.9 C) Oral (!) 112 17 94 % --  01/26/24 0345 (!) 107/38 -- -- (!) 111 18 95 % --  01/26/24 0330 (!) 99/41 -- -- (!) 112 16 97 % --  01/26/24 0315 (!) 101/38 -- -- (!) 112 18 99 % --  01/26/24 0300 (!) 118/40 -- -- (!) 114 17 96 % --  01/26/24 0245 (!) 119/30 -- -- (!) 113 16 94 % --  01/26/24 0230 (!) 122/39 -- -- (!) 112 (!) 22 92 % --  01/26/24 0215 (!) 109/35 -- -- (!) 113 12 93 % --  01/26/24 0200 (!) 111/34 -- -- (!) 112 18 94 % --  01/26/24 0145 (!) 115/38 -- -- (!) 112 16 93 % --  01/26/24 0130 (!) 118/39 -- -- (!) 113 14 94 % --  01/26/24 0123 (!) 118/37 -- -- (!) 113 (!) 9 94 % --  01/26/24 0115 -- -- -- (!) 112 12 93 % --  01/26/24 0100 -- -- -- (!) 113 13 93 % --  01/26/24 0030 -- -- -- (!) 114 12 94 % --  01/26/24 0025 (!) 130/49 -- -- (!) 113 15 94 % --  01/26/24 0015 (!) 124/45 -- -- (!) 112 20 95 % --  01/26/24 0000 (!) 113/50 98.5 F (36.9 C) Oral (!) 112 -- 91 % --  01/25/24 2345 (!) 113/48 -- -- (!) 114 -- 90 % --  01/25/24 2330 (!) 102/39 -- -- (!) 114 18 91 % --  01/25/24 2315 (!) 114/40 -- -- (!) 114 18 91 % --  01/25/24 2300 (!) 107/40 -- -- (!) 114 18 94 % --  01/25/24 2245 (!) 106/37 -- -- (!) 114 19 94 % --  01/25/24 2230 (!) 103/37 -- -- (!) 114 18 (!) 88 % --  01/25/24 2215 (!) 113/39 -- -- (!) 115 16 90 % --  01/25/24 2200 (!)  119/44 -- -- (!) 115 (!) 22 91 % --  01/25/24 2145 (!) 115/39 -- -- (!) 113 18 94 % --  01/25/24 2130 (!) 116/39 -- -- (!) 114 17 94 % --  01/25/24 2115 (!) 115/40 -- -- (!) 113 17 94 % --  01/25/24 2100 (!) 108/38 -- -- (!) 115 18 94 % --  01/25/24 2045 (!) 112/39 -- -- (!) 114 18 91 % --  01/25/24 2030 (!) 111/41 -- -- (!) 114 18 93 % --  01/25/24 2015 (!) 95/38 -- -- (!) 113 12 94 % --  01/25/24 2000 (!) 117/43 98 F (36.7 C) Oral (!) 115 15 95 % --  01/25/24 1945 (!) 93/42 -- -- (!) 115 19 95 % --  01/25/24 1930 (!) 110/42 -- -- -- 15 -- --  01/25/24 1915 (!) 104/40 -- -- (!) 115 14 96 % --  01/25/24 1900 (!) 98/39 -- -- (!) 115 19 92 % --  01/25/24 1845 (!) 101/42 -- -- (!) 115 19 95 % --  01/25/24 1830 (!) 105/42 -- -- (!) 114 17 95 % --  01/25/24 1815 (!) 105/39 -- -- (!) 114 20 95 % --  01/25/24 1800 (!) 79/65 -- -- (!) 116 15 93 % --  01/25/24 1745 (!) 108/44 -- -- (!) 116 12 96 % --  01/25/24 1730 (!) 117/42 -- -- (!) 58 11 95 % --  01/25/24 1715 (!) 114/47 -- -- (!) 114 (!) 23 94 % --  01/25/24 1700 (!) 115/47 -- -- (!) 115 16 93 % --  01/25/24 1645 (!) 114/46 -- -- (!) 115  14 95 % --  01/25/24 1630 (!) 110/39 -- -- (!) 114 20 95 % --  01/25/24 1615 (!) 124/45 -- -- (!) 114 18 95 % --  01/25/24 1600 (!) 121/47 97.8 F (36.6 C) Axillary (!) 115 18 95 % --  01/25/24 1545 (!) 116/46 -- -- (!) 115 19 95 % --  01/25/24 1530 (!) 117/46 -- -- (!) 115 19 95 % --  01/25/24 1515 (!) 118/45 -- -- (!) 114 20 95 % --  01/25/24 1500 (!) 120/47 -- -- (!) 115 17 96 % --  01/25/24 1445 (!) 115/48 -- -- (!) 115 15 95 % --  01/25/24 1430 (!) 115/43 -- -- -- 18 -- --  01/25/24 1345 (!) 121/45 -- -- -- (!) 21 -- --  01/25/24 1330 (!) 117/45 -- -- -- (!) 30 -- --  01/25/24 1315 (!) 109/46 -- -- -- 17 -- --  01/25/24 1300 (!) 117/42 -- -- -- 18 -- --  01/25/24 1245 (!) 121/42 -- -- -- 18 -- --  01/25/24 1230 (!) 111/43 -- -- -- 11 -- --  01/25/24 1215 (!) 111/47 -- -- (!) 113 20 99 % --   01/25/24 1200 (!) 98/41 98.3 F (36.8 C) Oral -- 16 96 % --  01/25/24 1145 (!) 120/53 -- -- (!) 41 17 90 % --  01/25/24 1130 (!) 119/47 -- -- -- 13 -- --  01/25/24 1115 -- -- -- (!) 114 15 100 % --  01/25/24 1100 (!) 114/44 -- -- (!) 114 (!) 23 97 % --  01/25/24 1045 (!) 103/40 -- -- (!) 115 18 98 % --     Lines and Device Date on insertion # of days DC  Chiropractor     Foley     ETT       PHYSICAL EXAM:  General: Alert, cooperative, no distress, appears stated age.  Lungs: Clear to auscultation bilaterally. No Wheezing or Rhonchi. No rales. Heart: Regular rate and rhythm, no murmur, rub or gallop. Abdomen: Soft, left quadrant drain- serosanguinous  Extremities: atraumatic, no cyanosis. No edema. No clubbing Skin: No rashes or lesions. Or bruising Lymph: Cervical, supraclavicular normal. Neurologic: Grossly non-focal  Lab Results    Latest Ref Rng & Units 01/26/2024    3:27 AM 01/25/2024    1:09 PM 01/25/2024    3:30 AM  CBC  WBC 4.0 - 10.5 K/uL 10.5  11.3  9.5   Hemoglobin 13.0 - 17.0 g/dL 8.6  9.1  8.7   Hematocrit 39.0 - 52.0 % 27.1  28.6  28.4   Platelets 150 - 400 K/uL 193  202  185        Latest Ref Rng & Units 01/26/2024    3:27 AM 01/25/2024    3:30 AM 01/24/2024    3:54 PM  CMP  Glucose 70 - 99 mg/dL 862  852  826   BUN 8 - 23 mg/dL 39  35  31   Creatinine 0.61 - 1.24 mg/dL 6.22  6.37  6.62   Sodium 135 - 145 mmol/L 128  130  132   Potassium 3.5 - 5.1 mmol/L 3.5  3.3  3.6   Chloride 98 - 111 mmol/L 93  93  94   CO2 22 - 32 mmol/L 26  27  27    Calcium  8.9 - 10.3 mg/dL 8.3  8.3  8.3   Total Protein 6.5 - 8.1 g/dL  6.5    Total Bilirubin 0.0 -  1.2 mg/dL  0.7    Alkaline Phos 38 - 126 U/L  64    AST 15 - 41 U/L  22    ALT 0 - 44 U/L  33        Microbiology: 01/24/24 BC Enterococcus 4/4  Studies/Results: CT Angio Abd/Pel w/ and/or w/o Result Date: 01/26/2024 CLINICAL DATA:  Splenic fluid collection. Possible infarct versus abscess.  EXAM: CTA ABDOMEN AND PELVIS WITHOUT AND WITH CONTRAST TECHNIQUE: Multidetector CT imaging of the abdomen and pelvis was performed using the standard protocol during bolus administration of intravenous contrast. Multiplanar reconstructed images and MIPs were obtained and reviewed to evaluate the vascular anatomy. RADIATION DOSE REDUCTION: This exam was performed according to the departmental dose-optimization program which includes automated exposure control, adjustment of the mA and/or kV according to patient size and/or use of iterative reconstruction technique. CONTRAST:  OMNIPAQUE  IOHEXOL  350 MG/ML SOLN COMPARISON:  CT scan without contrast performed yesterday 01/25/2024 FINDINGS: VASCULAR Aorta: Normal caliber aorta without aneurysm, dissection, vasculitis or significant stenosis. Scattered atherosclerotic vascular calcifications. 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 The left hepatic artery is replaced to the left gastric artery. No aneurysm or dissection. SMA: Patent without evidence of aneurysm, dissection, vasculitis or significant stenosis. Renals: Solitary renal arteries bilaterally. Calcified plaque results in high-grade stenosis of the left renal artery. The right renal artery is patent but relatively small in caliber. No aneurysm or changes of FMD. IMA: Patent without evidence of aneurysm, dissection, vasculitis or significant stenosis. Inflow: Patent without evidence of aneurysm, dissection, vasculitis or significant stenosis. Proximal Outflow: Bilateral common femoral and visualized portions of the superficial and profunda femoral arteries are patent without evidence of aneurysm, dissection, vasculitis or significant stenosis. Veins: Widely patent portal, splenic and visceral veins. Review of the MIP images confirms the above findings. NON-VASCULAR Lower chest: Dependent atelectasis bilaterally. Cardiomegaly. Calcified  atherosclerotic plaque along the coronary arteries. Thickening calcified aortic valve. Hepatobiliary: Normal hepatic contour and morphology. No discrete hepatic lesions. Normal appearance of the gallbladder. No intra or extrahepatic biliary ductal dilatation. Pancreas: Unremarkable. No pancreatic ductal dilatation or surrounding inflammatory changes. Spleen: Oblong rounded hypoechoic collection in the subcapsular aspect of the spleen extends from cranial to caudal and measures 12.0 x 4.2 x 4.4 cm. The collection is circumscribed. No evidence of peripheral enhancement or hyperemia. There is a second smaller wedge-shaped focus of ill-defined low attenuation in the anterior lower pole of the spleen. Overall, these findings suggest a combination of acute and subacute splenic infarcts. Adrenals/Urinary Tract: Normal adrenal glands. 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 Similar findings have been noted on multiple prior examinations. Stomach/Bowel: Stomach is within normal limits. Appendix appears normal. No evidence of bowel wall thickening, distention, or inflammatory changes. Lymphatic: No suspicious lymphadenopathy. Reproductive: Prostatomegaly. Other: No abdominal wall hernia or abnormality. No abdominopelvic ascites. Musculoskeletal: No acute fracture or aggressive appearing lytic or blastic osseous lesion. Lower lumbar degenerative disc disease and facet arthropathy. IMPRESSION: 1. Oblong low-attenuation fluid collection in the subcapsular periphery of the spleen extending from cranial to caudal. There is a second smaller wedge-shaped and less well-defined region of decreased enhancement in the inferior and anterior aspect of the splenic tip. Overall findings are most suggestive of a combination of acute and subacute splenic infarct. No peripheral enhancement, hyperemia or internal gas to suggest the presence of infection. 2. Calcified atherosclerotic plaque at the origin of the  celiac axis with  mild stenosis. 3. 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 4. Extensive cystic changes of both kidneys with cysts of variable density and complexity as seen on prior imaging. As before, outpatient MRI with gadolinium contrast could be considered to exclude neoplasm if clinically warranted. 5. Additional ancillary findings as above. Electronically Signed   By: Wilkie Lent M.D.   On: 01/26/2024 08:46   MR BRAIN WO CONTRAST Result Date: 01/25/2024 EXAM: MRI BRAIN WITHOUT CONTRAST 01/25/2024 02:21:13 PM TECHNIQUE: Multiplanar multisequence MRI of the head/brain was performed without the administration of intravenous contrast. COMPARISON: None available. CLINICAL HISTORY: Mental status change, unknown cause. The patient presents with a mental status change of unknown cause. 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 T2 hyperintensities in the cerebral white matter bilaterally are nonspecific but compatible with mild chronic small vessel ischemic disease. There is mild to moderate cerebral atrophy. No intracranial hemorrhage, mass, midline shift, hydrocephalus, or extra-axial fluid collection is identified. Major intracranial vascular flow voids are preserved. ORBITS: No significant abnormality. SINUSES AND MASTOIDS: No significant abnormality. BONES AND SOFT TISSUES: Normal marrow signal. No significant soft tissue abnormality. IMPRESSION: 1. 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 2. Mild chronic small vessel ischemic disease. Electronically signed by: Dasie Hamburg MD 01/25/2024 04:40 PM EST RP Workstation: [id number]   ECHOCARDIOGRAM COMPLETE Result Date: 01/25/2024    ECHOCARDIOGRAM REPORT   Patient Name:   TEEGAN BRANDIS Date of Exam: 01/25/2024 Medical Rec #:  [id number]     Height:       77.0 in Accession #:    [id number]    Weight:       219.1 lb Date of Birth:  1941-01-04     BSA:           2.325 m Patient Age:    83 years      BP:           111/47 mmHg Patient Gender: M             HR:           113 bpm. Exam Location:  [facility] Procedure: 2D Echo, Cardiac Doppler, Color Doppler, 3D Echo and Strain Analysis            (Both Spectral and Color Flow Doppler were utilized during            procedure). Indications:     Shock R57.9  History:         Patient has prior history of Echocardiogram examinations, most                  recent 07/18/2023. Risk Factors:Diabetes and Hypertension.  Sonographer:     Christopher Furnace Referring Phys:  [id number] LONELL KANDICE MOOSE Diagnosing Phys: Cara JONETTA Lovelace MD  Sonographer Comments: Global longitudinal strain was attempted. IMPRESSIONS  1. Left ventricular ejection fraction, by estimation, is 45 to 50%. The left ventricle has mildly decreased function. The left ventricle demonstrates global hypokinesis. The left ventricular internal cavity size was mildly dilated. There is mild asymmetric left ventricular hypertrophy. Left ventricular diastolic parameters are consistent with Grade II diastolic dysfunction (pseudonormalization). The average left ventricular global longitudinal strain is 6.7 %. The global longitudinal strain is abnormal.  2. Right ventricular systolic function is low normal. The right ventricular size is mildly enlarged.  3. Left atrial size was mildly dilated.  4. The mitral valve is grossly normal. Mild  to moderate mitral valve regurgitation.  5. Tricuspid valve regurgitation is mild to moderate.  6. The aortic valve is calcified. Aortic valve regurgitation is mild. Mild aortic valve stenosis. FINDINGS  Left Ventricle: Left ventricular ejection fraction, by estimation, is 45 to 50%. The left ventricle has mildly decreased function. The left ventricle demonstrates global hypokinesis. The average left ventricular global longitudinal strain is 6.7 %. Strain was performed and the global longitudinal strain is abnormal. The left ventricular internal cavity size was  mildly dilated. There is mild asymmetric left ventricular hypertrophy. Left ventricular diastolic parameters are consistent with Grade II diastolic dysfunction (pseudonormalization). Right Ventricle: The right ventricular size is mildly enlarged. No increase in right ventricular wall thickness. Right ventricular systolic function is low normal. Left Atrium: Left atrial size was mildly dilated. Right Atrium: Right atrial size was normal in size. Pericardium: There is no evidence of pericardial effusion. Mitral Valve: The mitral valve is grossly normal. Mild to moderate mitral valve regurgitation. MV peak gradient, 12.4 mmHg. The mean mitral valve gradient is 6.0 mmHg. Tricuspid Valve: The tricuspid valve is normal in structure. Tricuspid valve regurgitation is mild to moderate. Aortic Valve: The aortic valve is calcified. Aortic valve regurgitation is mild. Aortic regurgitation PHT measures 206 msec. Mild aortic stenosis is present. Aortic valve mean gradient measures 16.2 mmHg. Aortic valve peak gradient measures 30.7 mmHg. Aortic valve area, by VTI measures 1.51 cm. Pulmonic Valve: The pulmonic valve was not well visualized. Pulmonic valve regurgitation is not visualized. Aorta: The aortic root was not well visualized. IAS/Shunts: No atrial level shunt detected by color flow Doppler. Additional Comments: 3D was performed not requiring image post processing on an independent workstation and was abnormal.  LEFT VENTRICLE PLAX 2D LVIDd:         5.20 cm      Diastology LVIDs:         3.90 cm      LV e' medial:    3.59 cm/s LV PW:         1.10 cm      LV E/e' medial:  39.0 LV IVS:        1.40 cm      LV e' lateral:   8.27 cm/s LVOT diam:     2.00 cm      LV E/e' lateral: 16.9 LV SV:         62 LV SV Index:   26           2D Longitudinal Strain LVOT Area:     3.14 cm     2D Strain GLS (A4C):   5.9 % LV IVRT:       48 msec      2D Strain GLS (A3C):   6.1 %                             2D Strain GLS (A2C):   8.2 %                              2D Strain GLS Avg:     6.7 % LV Volumes (MOD) LV vol d, MOD A2C: 147.0 ml LV vol d, MOD A4C: 112.0 ml LV vol s, MOD A2C: 83.5 ml LV vol s, MOD A4C: 72.4 ml LV SV MOD A2C:     63.5 ml LV SV MOD A4C:     112.0 ml LV  SV MOD BP:      47.7 ml RIGHT VENTRICLE RV Basal diam:  4.00 cm RV Mid diam:    3.50 cm RV S prime:     11.90 cm/s TAPSE (M-mode): 1.8 cm LEFT ATRIUM             Index        RIGHT ATRIUM           Index LA diam:        4.00 cm 1.72 cm/m   RA Area:     15.20 cm LA Vol (A2C):   97.8 ml 42.06 ml/m  RA Volume:   31.10 ml  13.37 ml/m LA Vol (A4C):   91.0 ml 39.13 ml/m LA Biplane Vol: 96.0 ml 41.28 ml/m  AORTIC VALVE AV Area (Vmax):    1.27 cm AV Area (Vmean):   1.28 cm AV Area (VTI):     1.51 cm AV Vmax:           277.25 cm/s AV Vmean:          183.000 cm/s AV VTI:            0.409 m AV Peak Grad:      30.7 mmHg AV Mean Grad:      16.2 mmHg LVOT Vmax:         112.00 cm/s LVOT Vmean:        74.500 cm/s LVOT VTI:          0.196 m LVOT/AV VTI ratio: 0.48 AI PHT:            206 msec  AORTA Ao Root diam: 2.70 cm MITRAL VALVE                  TRICUSPID VALVE MV Area (PHT): 5.31 cm       TR Peak grad:   26.4 mmHg MV Area VTI:   2.15 cm       TR Vmax:        257.00 cm/s MV Peak grad:  12.4 mmHg MV Mean grad:  6.0 mmHg       SHUNTS MV Vmax:       1.76 m/s       Systemic VTI:  0.20 m MV Vmean:      119.0 cm/s     Systemic Diam: 2.00 cm MV Decel Time: 143 msec MR Peak grad:    108.6 mmHg MR Mean grad:    74.0 mmHg MR Vmax:         521.00 cm/s MR Vmean:        418.0 cm/s MR PISA:         4.02 cm MR PISA Eff ROA: 25 mm MR PISA Radius:  0.80 cm MV E velocity: 140.00 cm/s MV A velocity: 114.00 cm/s MV E/A ratio:  1.23 Dwayne D Callwood MD Electronically signed by Cara JONETTA Lovelace MD Signature Date/Time: 01/25/2024/2:49:15 PM    Final    CT ABDOMEN PELVIS WO CONTRAST Result Date: 01/25/2024 EXAM: CT ABDOMEN AND PELVIS WITHOUT CONTRAST 01/25/2024 11:06:03 AM TECHNIQUE: CT of the abdomen and  pelvis was performed without the administration of intravenous contrast. Multiplanar reformatted images are provided for review. Automated exposure control, iterative reconstruction, and/or weight-based adjustment of the mA/kV was utilized to reduce the radiation dose to as low as reasonably achievable. COMPARISON: 09/07/2023 CLINICAL HISTORY: Sepsis. FINDINGS: LOWER CHEST: Coronary and aortic atherosclerosis with moderate cardiomegaly. Trace bilateral pleural effusions with mild atelectasis in the lung bases. Hazy  airspace opacity medially in the left lower lobe on image 9 series 4 is nonspecific, early bronchopneumonia not excluded. LIVER: The liver is unremarkable. GALLBLADDER AND BILE DUCTS: Contracted gallbladder. No biliary ductal dilatation. SPLEEN: Abnormal hypodense lesions in the spleen measuring up to 10.5 x 4.5 x 4.0 cm (volume = 99 cm\S\3), not present on 09/07/2023. Splenic abscess is not excluded. PANCREAS: No acute abnormality. ADRENAL GLANDS: No acute abnormality. KIDNEYS, URETERS AND BLADDER: Scattered hypodense and hyperdense renal lesions similar to previous, suspicious for polycystic kidney disease. Enhancement not assessed on today's noncontrast examination. Overall morphology similar to the 09/07/2023 exam. No stones in the kidneys or ureters. No hydronephrosis. No perinephric or periureteral stranding. Urinary bladder is unremarkable. GI AND BOWEL: Stomach demonstrates no acute abnormality. Stable dilated appendix at 9 mm diameter on image 73 series 2 with internal high density suspicious for appendicolith, similar appearance on 09/07/2023. Given the chronic prominence, an underlying appendiceal mass cannot be readily excluded. No current periappendiceal inflammatory findings. There is no bowel obstruction. PERITONEUM AND RETROPERITONEUM: No ascites. No free air. New nonspecific presacral edema. VASCULATURE: Aorta is normal in caliber. Atherosclerosis atheromatous plaque at the origin of the  superior mesenteric artery. LYMPH NODES: No lymphadenopathy. REPRODUCTIVE ORGANS: Stable prostatomegaly. BONES AND SOFT TISSUES: Low level subcutaneous edema along the flanks. Bridging spurring of both sacroiliac joints. 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 Grade 1 degenerative anterolisthesis at L4-L5 with lower lumbar spondylosis and degenerative disc disease and suspected bilateral foraminal stenosis at L3-L4 and L4-L5 with borderline bilateral foraminal stenosis at L5-S1. A small umbilical hernia contains adipose tissue. IMPRESSION: 1. Abnormal hypodense lesions in the spleen measuring up to 10.5 x 4.5 x 4.0 cm (volume = 99 cm\S\3), not present on prior exam, raising concern for splenic abscesses or hematomas. 2. Stable dilated appendix at 9 mm diameter with internal high density suspicious for appendicolith. Given the chronic prominence, an underlying appendiceal mass cannot be readily excluded. 3. Hazy airspace opacity medially in the left lower lobe, nonspecific, with early bronchopneumonia not excluded. 4. Scattered hypodense and hyperdense renal lesions are suspicious for polycystic kidney disease. 5. New nonspecific presacral edema. 6. 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 7. Grade 1 degenerative anterolisthesis at L4-5 with lower lumbar spondylosis and degenerative disc disease and suspected bilateral foraminal stenosis at L3-4 and L4-5 with borderline bilateral foraminal stenosis at L5-S1. 8. Coronary and aortic atherosclerosis with moderate cardiomegaly. 9. Trace bilateral pleural effusions with mild atelectasis in the lung bases. 10. Stable prostatomegaly. Impression #1 will be telephoned to the referring provider or the referring provider's representative by professional radiology assistant [facility]) personnel, with communication documented in the clario dashboard. Electronically signed by: Ryan Salvage MD MD 01/25/2024 11:37 AM EST RP Workstation:  [id number]     Assessment/Plan: 84 y.o. 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   Enterococcus bacteremia -with sepsis  source could be AV FISTULA VS GUS Pt was on ceftriaxone  and vanco- changed to ampicillin  adjusted to Dialysis. 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

## 2024-01-26 NOTE — Progress Notes (Signed)
 Per Inge, NP ok to give one time dose of morphine  for patient's incision site pain. 9/10. NP changing order to fentanyl  PRN for further pain control needs.

## 2024-01-26 NOTE — Progress Notes (Signed)
 [facility] SURGICAL PROGRESS NOTE (cpt [phone])  Hospital Day(s): 3.   Interval History: Patient seen and examined, no acute events or new complaints overnight. He is on 6 mcg Norepinephrine  this AM. Patient reports his abdomen still doesn't feel right but he denied any specific pain. No fever, chills, nausea, emesis. His leukocytosis has resolved this AM; WBC 10.5K. Hgb to 8.6. BMP consistent with ESRD on HD. He is NPO this morning. Continues on Ampicillin . Plan for IR revaluation of this splenic collection today.   Review of Systems:  Constitutional: denies fever, chills  HEENT: denies cough or congestion  Respiratory: denies any shortness of breath  Cardiovascular: denies chest pain or palpitations  Gastrointestinal: denies abdominal pain, N/V Genitourinary: denies burning with urination or urinary frequency Musculoskeletal: denies pain, decreased motor or sensation  Vital signs in last 24 hours: [min-max] current  Temp:  [97.8 F (36.6 C)-98.5 F (36.9 C)] 98 F (36.7 C) (01/14 0800) Pulse Rate:  [41-116] 113 (01/14 0800) Resp:  [9-30] 16 (01/14 0800) BP: (79-130)/(30-65) 99/44 (01/14 0800) SpO2:  [86 %-100 %] 97 % (01/14 0800) Weight:  [100.2 kg] 100.2 kg (01/14 0500)     Height: 6' 5 (195.6 cm) Weight: 100.2 kg BMI (Calculated): 26.19   Intake/Output last 2 shifts:  01/13 0701 - 01/14 0700 In: 1108.2 [P.O.:240; I.V.:668.2; IV Piggyback:200] Out: 900 [Urine:900]   Physical Exam:  Constitutional: alert, cooperative and no distress  HENT: normocephalic without obvious abnormality  Eyes: PERRL, EOM's grossly intact and symmetric  Respiratory: breathing non-labored at rest  Cardiovascular: tachycardic and sinus rhythm, PVCs Gastrointestinal: soft, non-tender, and non-distended, no rebound/guarding. McBurney's point negative. He is certainly not peritonitic  Labs:     Latest Ref Rng & Units 01/26/2024    3:27 AM 01/25/2024    1:09 PM 01/25/2024    3:30 AM   CBC  WBC 4.0 - 10.5 K/uL 10.5  11.3  9.5   Hemoglobin 13.0 - 17.0 g/dL 8.6  9.1  8.7   Hematocrit 39.0 - 52.0 % 27.1  28.6  28.4   Platelets 150 - 400 K/uL 193  202  185       Latest Ref Rng & Units 01/26/2024    3:27 AM 01/25/2024    3:30 AM 01/24/2024    3:54 PM  CMP  Glucose 70 - 99 mg/dL 862  852  826   BUN 8 - 23 mg/dL 39  35  31   Creatinine 0.61 - 1.24 mg/dL 6.22  6.37  6.62   Sodium 135 - 145 mmol/L 128  130  132   Potassium 3.5 - 5.1 mmol/L 3.5  3.3  3.6   Chloride 98 - 111 mmol/L 93  93  94   CO2 22 - 32 mmol/L 26  27  27    Calcium  8.9 - 10.3 mg/dL 8.3  8.3  8.3   Total Protein 6.5 - 8.1 g/dL  6.5    Total Bilirubin 0.0 - 1.2 mg/dL  0.7    Alkaline Phos 38 - 126 U/L  64    AST 15 - 41 U/L  22    ALT 0 - 44 U/L  33       Imaging studies: No new pertinent imaging studies   Assessment/Plan:  84 y.o. 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               -  Plan for IR aspiration +/- drain placement of splenic collection with IR today; Appreciate assistance  - Again, his appendix dilation is stable since August and there is no surrounding inflammatory changes to suggest appendicitis. His abdomen is benign at this time. I do not think he warrants any surgical intervention nor is he an optimal candidate in this current state              - NPO for planned IR procedure  - Continue IV Abx for now; ID following             - Monitor abdominal examination  - Monitor leukocytosis; normalized this AM  - Further management per consulting and primary services; we can follow along for now    All of the above findings and recommendations were discussed with the patient, patient's family (daughter), and the medical team, and all of patient's and  family's questions were answered to their expressed satisfaction.  -- Arthea Platt, PA-C [facility] 01/26/2024, 8:48 AM M-F: 7am - 4pm

## 2024-01-26 NOTE — Plan of Care (Signed)
 Alert and oriented patient, denies pain this shift. Patient continues require Levophed  to maintain MAP>60 or SBP>100, currently infusing at 6 mcg/min. Antibiotics infused per order. Patient down for CT angio of abdomen this shift and returned without complications. Patient did have 3 incontinent brown stools and one incontinent urine episode. Complete bath and linen change done at that time. Sacral foam and foam dressings to BL sacral wounds changed at that time also. High fall risk.  Problem: Coping: Goal: Ability to adjust to condition or change in health will improve Outcome: Progressing   Problem: Fluid Volume: Goal: Ability to maintain a balanced intake and output will improve Outcome: Progressing   Problem: Metabolic: Goal: Ability to maintain appropriate glucose levels will improve Outcome: Progressing   Problem: Nutritional: Goal: Maintenance of adequate nutrition will improve Outcome: Progressing   Problem: Clinical Measurements: Goal: Diagnostic test results will improve Outcome: Progressing Goal: Respiratory complications will improve Outcome: Progressing Goal: Cardiovascular complication will be avoided Outcome: Progressing   Problem: Pain Managment: Goal: General experience of comfort will improve and/or be controlled Outcome: Progressing   Problem: Safety: Goal: Ability to remain free from injury will improve Outcome: Progressing

## 2024-01-27 ENCOUNTER — Inpatient Hospital Stay

## 2024-01-27 ENCOUNTER — Encounter: Admission: EM | Disposition: E | Payer: Self-pay | Source: Home / Self Care | Attending: Pulmonary Disease

## 2024-01-27 ENCOUNTER — Inpatient Hospital Stay: Admit: 2024-01-27 | Discharge: 2024-01-27 | Disposition: A | Attending: Student

## 2024-01-27 ENCOUNTER — Other Ambulatory Visit: Payer: Self-pay

## 2024-01-27 ENCOUNTER — Encounter: Payer: Self-pay | Admitting: Certified Registered"

## 2024-01-27 DIAGNOSIS — I120 Hypertensive chronic kidney disease with stage 5 chronic kidney disease or end stage renal disease: Secondary | ICD-10-CM | POA: Diagnosis not present

## 2024-01-27 DIAGNOSIS — I4891 Unspecified atrial fibrillation: Secondary | ICD-10-CM | POA: Diagnosis not present

## 2024-01-27 DIAGNOSIS — D735 Infarction of spleen: Secondary | ICD-10-CM | POA: Diagnosis not present

## 2024-01-27 DIAGNOSIS — E1122 Type 2 diabetes mellitus with diabetic chronic kidney disease: Secondary | ICD-10-CM | POA: Diagnosis not present

## 2024-01-27 DIAGNOSIS — I959 Hypotension, unspecified: Secondary | ICD-10-CM

## 2024-01-27 DIAGNOSIS — D733 Abscess of spleen: Secondary | ICD-10-CM | POA: Diagnosis not present

## 2024-01-27 DIAGNOSIS — Z7901 Long term (current) use of anticoagulants: Secondary | ICD-10-CM | POA: Diagnosis not present

## 2024-01-27 DIAGNOSIS — B952 Enterococcus as the cause of diseases classified elsewhere: Secondary | ICD-10-CM | POA: Diagnosis not present

## 2024-01-27 DIAGNOSIS — G9340 Encephalopathy, unspecified: Secondary | ICD-10-CM | POA: Diagnosis not present

## 2024-01-27 DIAGNOSIS — R6521 Severe sepsis with septic shock: Secondary | ICD-10-CM | POA: Diagnosis not present

## 2024-01-27 DIAGNOSIS — Z794 Long term (current) use of insulin: Secondary | ICD-10-CM | POA: Diagnosis not present

## 2024-01-27 DIAGNOSIS — E1151 Type 2 diabetes mellitus with diabetic peripheral angiopathy without gangrene: Secondary | ICD-10-CM | POA: Diagnosis not present

## 2024-01-27 DIAGNOSIS — R7881 Bacteremia: Secondary | ICD-10-CM | POA: Diagnosis not present

## 2024-01-27 DIAGNOSIS — I6350 Cerebral infarction due to unspecified occlusion or stenosis of unspecified cerebral artery: Secondary | ICD-10-CM | POA: Diagnosis not present

## 2024-01-27 DIAGNOSIS — A4181 Sepsis due to Enterococcus: Secondary | ICD-10-CM | POA: Diagnosis not present

## 2024-01-27 DIAGNOSIS — D631 Anemia in chronic kidney disease: Secondary | ICD-10-CM | POA: Diagnosis not present

## 2024-01-27 DIAGNOSIS — N186 End stage renal disease: Secondary | ICD-10-CM | POA: Diagnosis not present

## 2024-01-27 DIAGNOSIS — Z992 Dependence on renal dialysis: Secondary | ICD-10-CM | POA: Diagnosis not present

## 2024-01-27 LAB — LIPID PANEL
Cholesterol: 82 mg/dL (ref 0–200)
HDL: 23 mg/dL — ABNORMAL LOW
LDL Cholesterol: 42 mg/dL (ref 0–99)
Total CHOL/HDL Ratio: 3.5 ratio
Triglycerides: 79 mg/dL
VLDL: 16 mg/dL (ref 0–40)

## 2024-01-27 LAB — GLUCOSE, CAPILLARY
Glucose-Capillary: 122 mg/dL — ABNORMAL HIGH (ref 70–99)
Glucose-Capillary: 110 mg/dL — ABNORMAL HIGH (ref 70–99)
Glucose-Capillary: 134 mg/dL — ABNORMAL HIGH (ref 70–99)
Glucose-Capillary: 121 mg/dL — ABNORMAL HIGH (ref 70–99)

## 2024-01-27 LAB — CBC
HCT: 28.5 % — ABNORMAL LOW (ref 39.0–52.0)
Hemoglobin: 8.9 g/dL — ABNORMAL LOW (ref 13.0–17.0)
MCH: 24.7 pg — ABNORMAL LOW (ref 26.0–34.0)
MCHC: 31.2 g/dL (ref 30.0–36.0)
MCV: 78.9 fL — ABNORMAL LOW (ref 80.0–100.0)
Platelets: 227 K/uL (ref 150–400)
RBC: 3.61 MIL/uL — ABNORMAL LOW (ref 4.22–5.81)
RDW: 19.2 % — ABNORMAL HIGH (ref 11.5–15.5)
WBC: 8.5 K/uL (ref 4.0–10.5)
nRBC: 0 % (ref 0.0–0.2)

## 2024-01-27 LAB — RENAL FUNCTION PANEL
Albumin: 2.6 g/dL — ABNORMAL LOW (ref 3.5–5.0)
Anion gap: 11 (ref 5–15)
BUN: 26 mg/dL — ABNORMAL HIGH (ref 8–23)
CO2: 27 mmol/L (ref 22–32)
Calcium: 8.8 mg/dL — ABNORMAL LOW (ref 8.9–10.3)
Chloride: 94 mmol/L — ABNORMAL LOW (ref 98–111)
Creatinine, Ser: 2.97 mg/dL — ABNORMAL HIGH (ref 0.61–1.24)
GFR, Estimated: 20 mL/min — ABNORMAL LOW
Glucose, Bld: 135 mg/dL — ABNORMAL HIGH (ref 70–99)
Phosphorus: 3.1 mg/dL (ref 2.5–4.6)
Potassium: 3.8 mmol/L (ref 3.5–5.1)
Sodium: 132 mmol/L — ABNORMAL LOW (ref 135–145)

## 2024-01-27 LAB — MAGNESIUM: Magnesium: 1.9 mg/dL (ref 1.7–2.4)

## 2024-01-27 LAB — TROPONIN T, HIGH SENSITIVITY: Troponin T High Sensitivity: 456 ng/L (ref 0–19)

## 2024-01-27 SURGERY — ECHOCARDIOGRAM, TRANSESOPHAGEAL
Anesthesia: General

## 2024-01-27 MED ORDER — MIDAZOLAM HCL (PF) 2 MG/2ML IJ SOLN
2.0000 mg | Freq: Once | INTRAMUSCULAR | Status: DC | PRN
  Filled 2024-01-27: qty 2

## 2024-01-27 MED ORDER — NOREPINEPHRINE 16 MG/250ML-% IV SOLN
0.0000 ug/min | INTRAVENOUS | Status: DC
  Administered 2024-01-27: 25 ug/min via INTRAVENOUS
  Administered 2024-01-28: 14 ug/min via INTRAVENOUS
  Administered 2024-01-29: 8 ug/min via INTRAVENOUS
  Administered 2024-01-30: 12 ug/min via INTRAVENOUS
  Filled 2024-01-27 (×4): qty 250

## 2024-01-27 MED ORDER — MAGNESIUM SULFATE 2 GM/50ML IV SOLN
2.0000 g | Freq: Once | INTRAVENOUS | Status: AC
  Administered 2024-01-27: 2 g via INTRAVENOUS
  Filled 2024-01-27: qty 50

## 2024-01-27 MED ORDER — CALCIUM CARBONATE ANTACID 500 MG PO CHEW
1.0000 | CHEWABLE_TABLET | Freq: Four times a day (QID) | ORAL | Status: DC | PRN
  Administered 2024-01-27 – 2024-02-01 (×4): 200 mg via ORAL
  Filled 2024-01-27 (×4): qty 1

## 2024-01-27 MED ORDER — DEXMEDETOMIDINE HCL IN NACL 400 MCG/100ML IV SOLN
0.0000 ug/kg/h | INTRAVENOUS | Status: DC
  Administered 2024-01-27: 0.4 ug/kg/h via INTRAVENOUS
  Filled 2024-01-27: qty 100

## 2024-01-27 MED ORDER — PROPOFOL 10 MG/ML IV BOLUS
INTRAVENOUS | Status: AC
  Filled 2024-01-27: qty 20

## 2024-01-27 MED ORDER — POTASSIUM CHLORIDE CRYS ER 20 MEQ PO TBCR
20.0000 meq | EXTENDED_RELEASE_TABLET | Freq: Once | ORAL | Status: AC
  Administered 2024-01-27: 20 meq via ORAL
  Filled 2024-01-27: qty 1

## 2024-01-27 MED ORDER — LIDOCAINE HCL (PF) 2 % IJ SOLN
INTRAMUSCULAR | Status: AC
  Filled 2024-01-27: qty 5

## 2024-01-27 MED ORDER — POTASSIUM CHLORIDE 10 MEQ/100ML IV SOLN
10.0000 meq | INTRAVENOUS | Status: DC
  Filled 2024-01-27 (×2): qty 100

## 2024-01-27 NOTE — Plan of Care (Signed)
  Problem: Coping: Goal: Ability to adjust to condition or change in health will improve Outcome: Progressing   Problem: Fluid Volume: Goal: Ability to maintain a balanced intake and output will improve Outcome: Progressing   Problem: Metabolic: Goal: Ability to maintain appropriate glucose levels will improve Outcome: Progressing   Problem: Tissue Perfusion: Goal: Adequacy of tissue perfusion will improve Outcome: Progressing   

## 2024-01-27 NOTE — Plan of Care (Signed)
" °  Problem: Coping: Goal: Ability to adjust to condition or change in health will improve Outcome: Progressing   Problem: Nutritional: Goal: Maintenance of adequate nutrition will improve Outcome: Progressing   Problem: Education: Goal: Knowledge of General Education information will improve Description: Including pain rating scale, medication(s)/side effects and non-pharmacologic comfort measures Outcome: Progressing   Problem: Clinical Measurements: Goal: Respiratory complications will improve Outcome: Progressing   Problem: Coping: Goal: Level of anxiety will decrease Outcome: Progressing   Problem: Elimination: Goal: Will not experience complications related to bowel motility Outcome: Progressing Goal: Will not experience complications related to urinary retention Outcome: Progressing   "

## 2024-01-27 NOTE — Progress Notes (Signed)
 "  NAME:  [facility], MRN:  [id number], DOB:  [birth year], LOS: 4 ADMISSION DATE:  01/22/2024, CONSULTATION DATE: 01/24/2024 REFERRING MD: Dr. 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 Symptoms began several days prior to presentation. Patient was taking oseltamivir  for possible influenza. He reported dry cough and denied nausea, vomiting, abdominal pain, diarrhea, melena, or hematochezia. Last hemodialysis session was on Friday prior to admission.  ED Course: On arrival, vital signs: BP 106/49 mmHg, HR 111 bpm, RR 20, T 66F, SpO? 100% on room air. Initial labs showed WBC 11.8 K/L, Hgb 8.0 g/dL, platelets 790 K/L, sodium 134 mmol/L, potassium 3.5 mmol/L, BUN 46 mg/dL, creatinine 4.7 mg/dL, lactate 2.2 mmol/L, AST 73 U/L, ALT 70 U/L. Chest X-ray demonstrated cardiomegaly without acute consolidation. CT chest without contrast showed cardiomegaly and no acute airspace disease. Patient received 500 mL IV normal saline bolus, famotidine , and antacids and was admitted to TRH service for observation.   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), T2DM, HTN, PAD, chronic atrial fibrillation on anticoagulation, chronic anemia, anxiety/depression, and BPH   Micro Data  COVID/Influenza A&B/RSV 01/11>>negative  RVP 01/11>>negative  Blood x2 01/11>>enterococcus Faecalis  MRSA PCR 01/12>>negative  Tracheal aspirate 01/13>>  Significant Hospital Events: Including procedures, antibiotic start and stop dates in addition to other pertinent events   1/11: Admitted to trh service 1/12: Became hypotensive  started on levophed , critical care consulted 1/13: Pt continues to require levophed  gtt complains of his stomach not feeling good CT   Abd Pelvis pending concerning for splenic fluid collection concerning for possible abscess, Surgery and IR consulted, recommends aspiration and drain placement.  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 1/14: No acute events overnight, afebrile, remains on Levophed  (weaned to 6 mcg).  More awake and alert, consult Neurology for acute CVA on MRI yesterday.  Tentative plan for IR to perform splenic aspiration/drain today.  Cardiology tentatively planning for TEE tomorrow. 01/27/24- patient with sepsis bacteremia, for TEE today.   Had splenic drain (cultures with GPC+) and dialysis yesterday. ID on case is on ampicillin .  Concern for AV graft fistula contamination.    Interim History / Subjective:  As outlined above under significant events   Objective    Blood pressure (!) 107/41, pulse (!) 111, temperature 98.5 F (36.9 C), temperature source Oral, resp. rate 15, height 6' 5 (1.956 m), weight 101 kg, SpO2 97%.        Intake/Output Summary (Last 24 hours) at 01/27/2024 0853 Last data filed at 01/27/2024 9271 Gross per 24 hour  Intake 1256.54 ml  Output 1650 ml  Net -393.46 ml   Filed Weights   01/26/24 0500 01/26/24 1644 01/26/24 1935  Weight: 100.2 kg 101.6 kg 101 kg    Examination: General: Acute on chronically-ill appearing male, NAD on RA  HENT: Supple, no JVD  Lungs: Diminished throughout, even, non labored  Cardiovascular: Sinus tachycardia, no m/r/g, 2+ radial/1+, trace generalized edema Abdomen: Hypoactive BS x4, obese, taut, non tender  Extremities: Normal bulk and tone, moves all extremities  Neuro: Alert and oriented x2, follows commands, PERRLA GU: Deferred   Assessment and Plan   #  Acute CVA #Dementia #Depression  MRI Brain 01/25/24: 5 mm acute infarct at the left temporooccipital junction: Mild chronic small  vessel ischemic disease. -Treatment of metabolic derangements as outlined above -Provide supportive care -Promote normal sleep/wake cycle and family presence -Avoid sedating medications as able -Neurology consulted, appreciate input  -Continue outpatient donepezil  and prn xanax    #Shock: Septic and hypovolemic -present on admission - due to bacteremia and spelnic abscess #Elevated troponin due to demand ischemia  #Chronic atrial flutter/fibrillation   #Acute on chronic HFpEF  Hx: HTN and PAD  -Echocardiogram 01/25/24: LVEF 45 to 50%, grade 2 diastolic dysfunction, RV systolic function is low normal, RV size mildly enlarged mild to moderate mitral valve regurgitation, mild to moderate tricuspid valve regurgitation, mild aortic valve regurgitation, mild aortic valve stenosis -Continuous cardiac monitoring -Maintain MAP >65 -Cautious IV fluids -Vasopressors as needed to maintain MAP goal -Continue Midodrine   -Trend lactic acid until normalized -HS Troponin peaked at 789 -Volume removal with dialysis -Cardiology following, appreciate inpnt -Continue scheduled amiodarone  and finasteride  -Holding Anticoagulation for now given pending IR aspiration of splenic abscess and new CVA  #Severe Sepsis due to present on admission - due to bacteremia with E. faecalis #Enterococcus Faecalis Bacteremia -on IV ampicillin  per ID #Splenic Abscess- s/p PCD and IV abx, may need splenectomy if fails #Previous influenza infection  -Monitor fever curve -Trend WBC's & Procalcitonin -Follow cultures as above -ID following, appreciate input, Continue empiric Ampicillin  pending cultures & sensitivities - General Surgery and IR consulted, appreciate input ~tentative plan for drainage of abscess by IR on 1/14 -Tentative plan for TEE with cardiology on 1/15 -Continue Tamiflu    #Pulmonary edema  - Supplemental O2 for dyspnea and/or hypoxia - Maintain O2 sats 92% or higher  - Prn CXR's  - Fluid removal as  tolerated during HD   #ESRD on HD  #Hypokalemia  #Hyponatremia  #Hypomagnesia  - Trend BMP  - Replace electrolytes as indicated  - Strict I&O's - Nephrology consulted appreciate input: HD per recommendations   #Anemia of chronic kidney disease  - Trend CBC  - Monitor for s/sx of bleeding  - Transfuse for hgb <7  Type II diabetes mellitus  - CBG's ac/hs - SSI  - Target CBG readings 140 to 180 - Follow hypo/hyperglycemic protocol     Labs   CBC: Recent Labs  Lab 01/24/24 0317 01/24/24 1005 01/25/24 0330 01/25/24 1309 01/26/24 0327 01/27/24 0505  WBC 8.9  --  9.5 11.3* 10.5 8.5  NEUTROABS  --   --   --  8.8*  --   --   HGB 8.2* 9.1* 8.7* 9.1* 8.6* 8.9*  HCT 25.0* 27.7* 28.4* 28.6* 27.1* 28.5*  MCV 76.7*  --  79.1* 77.3* 78.1* 78.9*  PLT 202  --  185 202 193 227    Basic Metabolic Panel: Recent Labs  Lab 01/24/24 0317 01/24/24 1554 01/25/24 0330 01/26/24 0327 01/27/24 0505  NA 135 132* 130* 128* 132*  K 3.7 3.6 3.3* 3.5 3.8  CL 97* 94* 93* 93* 94*  CO2 25 27 27 26 27   GLUCOSE 120* 173* 147* 137* 135*  BUN 52* 31* 35* 39* 26*  CREATININE 5.05* 3.37* 3.62* 3.77* 2.97*  CALCIUM  8.5* 8.3* 8.3* 8.3* 8.8*  MG 2.0  --  1.9 1.8 1.9  PHOS 3.9 2.5 2.7 3.2 3.1   GFR: Estimated Creatinine Clearance: 23.3 mL/min (A) (by C-G formula based on SCr of 2.97 mg/dL (H)). Recent Labs  Lab 01/23/24 0323 01/23/24 0430 01/23/24  2200 01/24/24 0317 01/24/24 2318 01/25/24 0330 01/25/24 1158 01/25/24 1309 01/26/24 0327 01/27/24 0505  PROCALCITON  --   --  100.00  --   --   --   --   --   --   --   WBC  --    < >  --    < >  --  9.5  --  11.3* 10.5 8.5  LATICACIDVEN 1.5  --  1.4  --  1.5  --  2.1*  --   --   --    < > = values in this interval not displayed.    Liver Function Tests: Recent Labs  Lab 01/22/24 1745 01/24/24 0317 01/24/24 1554 01/25/24 0330 01/27/24 0505  AST 73* 34  --  22  --   ALT 70* 48*  --  33  --   ALKPHOS 86 66  --  64  --   BILITOT 0.5  0.9  --  0.7  --   PROT 7.6 6.7  --  6.5  --   ALBUMIN  3.1* 2.7* 2.7* 2.6* 2.6*   No results for input(s): LIPASE, AMYLASE in the last 168 hours. No results for input(s): AMMONIA in the last 168 hours.  ABG    Component Value Date/Time   HCO3 27.2 01/23/2024 2300   TCO2 17 (L) 11/14/2021 0718   O2SAT 81.4 01/23/2024 2300     Coagulation Profile: Recent Labs  Lab 01/23/24 0343 01/23/24 1859 01/26/24 0949  INR 2.1* 2.2* 1.6*    Cardiac Enzymes: No results for input(s): CKTOTAL, CKMB, CKMBINDEX, TROPONINI in the last 168 hours.  HbA1C: Hgb A1c MFr Bld  Date/Time Value Ref Range Status  01/23/2024 04:30 AM 5.9 (H) 4.8 - 5.6 % Final    Comment:    (NOTE) Diagnosis of Diabetes The following HbA1c ranges recommended by the American Diabetes Association (ADA) may be used as an aid in the diagnosis of diabetes mellitus.  Hemoglobin             Suggested A1C NGSP%              Diagnosis  <5.7                   Non Diabetic  5.7-6.4                Pre-Diabetic  >6.4                   Diabetic  <7.0                   Glycemic control for                       adults with diabetes.    01/23/2024 03:23 AM 5.7 (H) 4.8 - 5.6 % Final    Comment:    (NOTE) Diagnosis of Diabetes The following HbA1c ranges recommended by the American Diabetes Association (ADA) may be used as an aid in the diagnosis of diabetes mellitus.  Hemoglobin             Suggested A1C NGSP%              Diagnosis  <5.7                   Non Diabetic  5.7-6.4                Pre-Diabetic  >6.4  Diabetic  <7.0                   Glycemic control for                       adults with diabetes.      CBG: Recent Labs  Lab 01/26/24 0809 01/26/24 1321 01/26/24 1633 01/26/24 2103 01/27/24 0728  GLUCAP 122* 115* 130* 132* 122*    Review of Systems: Positives in BOLD   Gen: Denies fever, chills, weight change, fatigue, night sweats HEENT: Denies blurred  vision, double vision, hearing loss, tinnitus, sinus congestion, rhinorrhea, sore throat, neck stiffness, dysphagia PULM: Denies shortness of breath, cough, sputum production, hemoptysis, wheezing CV: Denies chest pain, edema, orthopnea, paroxysmal nocturnal dyspnea, palpitations GI: abdominal discomfort, nausea, vomiting, diarrhea, hematochezia, melena, constipation, change in bowel habits GU: Denies dysuria, hematuria, polyuria, oliguria, urethral discharge Endocrine: Denies hot or cold intolerance, polyuria, polyphagia or appetite change Derm: Denies rash, dry skin, scaling or peeling skin change Heme: Denies easy bruising, bleeding, bleeding gums Neuro: Denies headache, numbness, weakness, slurred speech, loss of memory or consciousness  Past Medical History:  He,  has a past medical history of Anxiety, Arthritis, Depression, Diabetes mellitus without complication (HCC), End stage renal disease (HCC), Hypertension, Pneumonia, and Renal disorder.   Surgical History:   Past Surgical History:  Procedure Laterality Date   A/V FISTULAGRAM Left 06/08/2023   Procedure: A/V Fistulagram;  Surgeon: Jama Cordella MATSU, MD;  Location: [facility];  Service: Cardiovascular;  Laterality: Left;   A/V SHUNT INTERVENTION Left 09/29/2023   Procedure: A/V SHUNT INTERVENTION;  Surgeon: Marea Selinda RAMAN, MD;  Location: [facility] CV [facility];  Service: Cardiovascular;  Laterality: Left;   APPLICATION OF WOUND VAC Right 11/14/2021   Procedure: APPLICATION OF WOUND VAC;  Surgeon: Jama Cordella MATSU, MD;  Location: [facility] ORS;  Service: Vascular;  Laterality: Right;   AV FISTULA PLACEMENT Left 11/14/2021   Procedure: ARTERIOVENOUS (AV) FISTULA CREATION ( BRACHIAL CEPHALIC);  Surgeon: Jama Cordella MATSU, MD;  Location: [facility] ORS;  Service: Vascular;  Laterality: Left;     Social History:   reports that he has never smoked. He has never used smokeless tobacco. He reports that he does not currently use drugs. He  reports that he does not drink alcohol.   Family History:  His family history is not on file.   Allergies Allergies[1]   Home Medications  Prior to Admission medications  Medication Sig Start Date End Date Taking? Authorizing Provider  acetaminophen  (TYLENOL ) 325 MG tablet Take 650 mg by mouth every 6 (six) hours as needed.   Yes [provider]  ALPRAZolam  (XANAX ) 0.5 MG tablet Take 0.5 mg by mouth 3 (three) times daily as needed for sleep. 02/05/21  Yes [provider]  amiodarone  (PACERONE ) 200 MG tablet Take 2 tablets (400 mg total) by mouth 2 (two) times daily for 7 days, THEN 1 tablet (200 mg total) daily. 07/24/23 01/22/24 Yes Sreenath, Sudheer B, MD  apixaban  (ELIQUIS ) 2.5 MG TABS tablet Take 1 tablet (2.5 mg total) by mouth 2 (two) times daily. 07/24/23 01/23/24 Yes Sreenath, Sudheer B, MD  Cholecalciferol  125 MCG (5000 UT) TABS Take 5,000 Units by mouth daily.   Yes [provider]  clopidogrel  (PLAVIX ) 75 MG tablet Take 1 tablet (75 mg total) by mouth daily. 06/09/23  Yes Schnier, Cordella MATSU, MD  diclofenac  Sodium (VOLTAREN ) 1 % GEL Apply 2 g topically 4 (four) times  daily. 07/24/23  Yes Sreenath, Sudheer B, MD  Docusate Sodium  (DSS) 100 MG CAPS Take 100 mg by mouth at bedtime.   Yes [provider]  donepezil  (ARICEPT ) 10 MG tablet Take 10 mg by mouth at bedtime. 05/14/21  Yes [provider]  finasteride  (PROSCAR ) 5 MG tablet Take 1 tablet (5 mg total) by mouth daily. 08/12/19  Yes Stoioff, Glendia BROCKS, MD  lidocaine -prilocaine  (EMLA ) cream Apply 1 Application topically as needed (Dialysis). 08/18/23  Yes [provider]  metoprolol  succinate (TOPROL -XL) 25 MG 24 hr tablet Take 0.5 tablets (12.5 mg total) by mouth daily. 07/24/23 01/22/24 Yes Sreenath, Sudheer B, MD  tamsulosin  (FLOMAX ) 0.4 MG CAPS capsule Take 1 capsule (0.4 mg total) by mouth daily. 08/12/19  Yes Stoioff, Glendia BROCKS, MD  enalapril  (VASOTEC ) 20 MG tablet Take 20 mg by mouth 2 (two)  times daily. Patient not taking: Reported on 01/20/2024 08/12/21   [provider]  furosemide  (LASIX ) 40 MG tablet Take 1 tablet (40 mg total) by mouth every other day. Patient not taking: Reported on 01/20/2024 07/25/23   Jhonny Sahara B, MD  glipiZIDE  (GLUCOTROL  XL) 5 MG 24 hr tablet Take by mouth. Patient not taking: Reported on 01/20/2024 12/01/11   [provider]  LOKELMA 10 g PACK packet Take 1 packet by mouth daily. Patient not taking: Reported on 01/20/2024 07/12/23   [provider]  sodium bicarbonate  650 MG tablet Take 650 mg by mouth 2 (two) times daily. Patient not taking: Reported on 01/20/2024 06/16/23   [provider]     Critical care provider statement:   Total critical care time: 33 minutes   Performed by: Parris MD   Critical care time was exclusive of separately billable procedures and treating other patients.   Critical care was necessary to treat or prevent imminent or life-threatening deterioration.   Critical care was time spent personally by me on the following activities: development of treatment plan with patient and/or surrogate as well as nursing, discussions with consultants, evaluation of patient's response to treatment, examination of patient, obtaining history from patient or surrogate, ordering and performing treatments and interventions, ordering and review of laboratory studies, ordering and review of radiographic studies, pulse oximetry and re-evaluation of patient's condition.    Eddy Termine, M.D.  Pulmonary & Critical Care Medicine                [1] No Known Allergies  "

## 2024-01-27 NOTE — Progress Notes (Signed)
 Central line ok for use per Inge, NP

## 2024-01-27 NOTE — Progress Notes (Signed)
 " [facility]  ROUNDING NOTE   Subjective:  Jeffery Joseph, 84 yo male is known to our practice, outpatient dialysis center at [facility] [address] And is followed by Dr. Marcelino.  Update:   Patient underwent splenic drain placement yesterday. Also had dialysis yesterday.   Objective:  Vital signs in last 24 hours:  Temp:  [97.4 F (36.3 C)-98.5 F (36.9 C)] 98.5 F (36.9 C) (01/15 0730) Pulse Rate:  [104-117] 111 (01/15 0730) Resp:  [11-28] 15 (01/15 0730) BP: (65-123)/(35-76) 107/41 (01/15 0730) SpO2:  [87 %-100 %] 97 % (01/15 0730) Weight:  [101 kg-101.6 kg] 101 kg (01/14 1935)  Weight change: 1.4 kg Filed Weights   01/26/24 0500 01/26/24 1644 01/26/24 1935  Weight: 100.2 kg 101.6 kg 101 kg    Intake/Output: I/O last 3 completed shifts: In: 1610.7 [I.V.:1110.7; IV Piggyback:500] Out: 2150 [Urine:1000; Drains:150; Other:1000]   Intake/Output this shift:  Total I/O In: 49.4 [I.V.:49.4] Out: -   Physical Exam: General: NAD  Head: Normocephalic  Eyes: Anicteric  Neck: Supple, trachea midline  Lungs:  Clear to auscultation  Heart: Regular rate and rhythm  Abdomen:  Soft, nontender, splenic drain in place  Extremities: No peripheral edema.  Neurologic: Alert and awake  Skin: No lesions  Access: Left AVF    Basic Metabolic Panel: Recent Labs  Lab 01/24/24 0317 01/24/24 1554 01/25/24 0330 01/26/24 0327 01/27/24 0505  NA 135 132* 130* 128* 132*  K 3.7 3.6 3.3* 3.5 3.8  CL 97* 94* 93* 93* 94*  CO2 25 27 27 26 27   GLUCOSE 120* 173* 147* 137* 135*  BUN 52* 31* 35* 39* 26*  CREATININE 5.05* 3.37* 3.62* 3.77* 2.97*  CALCIUM  8.5* 8.3* 8.3* 8.3* 8.8*  MG 2.0  --  1.9 1.8 1.9  PHOS 3.9 2.5 2.7 3.2 3.1    Liver Function Tests: Recent Labs  Lab 01/22/24 1745 01/24/24 0317 01/24/24 1554 01/25/24 0330 01/27/24 0505  AST 73* 34  --  22  --   ALT 70* 48*  --  33  --   ALKPHOS 86 66  --  64  --   BILITOT 0.5 0.9  --  0.7  --   PROT 7.6 6.7  --  6.5  --    ALBUMIN  3.1* 2.7* 2.7* 2.6* 2.6*   No results for input(s): LIPASE, AMYLASE in the last 168 hours. No results for input(s): AMMONIA in the last 168 hours.  CBC: Recent Labs  Lab 01/24/24 0317 01/24/24 1005 01/25/24 0330 01/25/24 1309 01/26/24 0327 01/27/24 0505  WBC 8.9  --  9.5 11.3* 10.5 8.5  NEUTROABS  --   --   --  8.8*  --   --   HGB 8.2* 9.1* 8.7* 9.1* 8.6* 8.9*  HCT 25.0* 27.7* 28.4* 28.6* 27.1* 28.5*  MCV 76.7*  --  79.1* 77.3* 78.1* 78.9*  PLT 202  --  185 202 193 227    Cardiac Enzymes: No results for input(s): CKTOTAL, CKMB, CKMBINDEX, TROPONINI in the last 168 hours.  BNP: Invalid input(s): POCBNP  CBG: Recent Labs  Lab 01/26/24 0809 01/26/24 1321 01/26/24 1633 01/26/24 2103 01/27/24 0728  GLUCAP 122* 115* 130* 132* 122*    Microbiology: Results for orders placed or performed during the hospital encounter of 01/22/24  Culture, blood (Routine X 2) w Reflex to ID Panel     Status: Abnormal   Collection Time: 01/23/24 10:33 PM   Specimen: BLOOD RIGHT ARM  Result Value Ref Range Status  Specimen Description   Final    BLOOD RIGHT ARM Performed at [facility], [address] [place], [place] [address]    Special Requests   Final    BOTTLES DRAWN AEROBIC AND ANAEROBIC Blood Culture results may not be optimal due to an inadequate volume of blood received in culture bottles Performed at [facility], [address], [place], [place] [address]    Culture  Setup Time   Final    GRAM POSITIVE COCCI IN BOTH AEROBIC AND ANAEROBIC BOTTLES CRITICAL RESULT CALLED TO, READ BACK BY AND VERIFIED WITH: KRISTAN MERRILL 01/24/24 1252 MU Performed at [facility], [address] [place], [place] [address]    Culture (A)  Final    ENTEROCOCCUS FAECALIS SUSCEPTIBILITIES PERFORMED ON PREVIOUS CULTURE WITHIN THE LAST 5 DAYS. Performed at [facility], [address], [place], [place] [address]    Report Status  01/26/2024 FINAL  Final  Resp panel by RT-PCR (RSV, Flu A&B, Covid) Anterior Nasal Swab     Status: None   Collection Time: 01/23/24 10:33 PM   Specimen: Anterior Nasal Swab  Result Value Ref Range Status   SARS Coronavirus 2 by RT PCR NEGATIVE NEGATIVE Final    Comment: (NOTE) SARS-CoV-2 target nucleic acids are NOT DETECTED.  The SARS-CoV-2 RNA is generally detectable in upper respiratory specimens during the acute phase of infection. The lowest concentration of SARS-CoV-2 viral copies this assay can detect is 138 copies/mL. A negative result does not preclude SARS-Cov-2 infection and should not be used as the sole basis for treatment or other patient management decisions. A negative result may occur with  improper specimen collection/handling, submission of specimen other than nasopharyngeal swab, presence of viral mutation(s) within the areas targeted by this assay, and inadequate number of viral copies(<138 copies/mL). A negative result must be combined with clinical observations, patient history, and epidemiological information. The expected result is Negative.  Fact Sheet for Patients:  bloggercourse.com  Fact Sheet for Healthcare Providers:  seriousbroker.it  This test is no t yet approved or cleared by the [place]  FDA and  has been authorized for detection and/or diagnosis of SARS-CoV-2 by FDA under an Emergency Use Authorization (EUA). This EUA will remain  in effect (meaning this test can be used) for the duration of the COVID-19 declaration under Section 564(b)(1) of the Act, 21 U.S.C.section 360bbb-3(b)(1), unless the authorization is terminated  or revoked sooner.       Influenza A by PCR NEGATIVE NEGATIVE Final   Influenza B by PCR NEGATIVE NEGATIVE Final    Comment: (NOTE) The Xpert Xpress SARS-CoV-2/FLU/RSV plus assay is intended as an aid in the diagnosis of influenza from Nasopharyngeal swab specimens  and should not be used as a sole basis for treatment. Nasal washings and aspirates are unacceptable for Xpert Xpress SARS-CoV-2/FLU/RSV testing.  Fact Sheet for Patients: bloggercourse.com  Fact Sheet for Healthcare Providers: seriousbroker.it  This test is not yet approved or cleared by the [place]  FDA and has been authorized for detection and/or diagnosis of SARS-CoV-2 by FDA under an Emergency Use Authorization (EUA). This EUA will remain in effect (meaning this test can be used) for the duration of the COVID-19 declaration under Section 564(b)(1) of the Act, 21 U.S.C. section 360bbb-3(b)(1), unless the authorization is terminated or revoked.     Resp Syncytial Virus by PCR NEGATIVE NEGATIVE Final    Comment: (NOTE) Fact Sheet for Patients: bloggercourse.com  Fact Sheet for Healthcare Providers: seriousbroker.it  This test is not yet approved or cleared by the [place]  FDA and has been authorized for detection and/or diagnosis of SARS-CoV-2 by FDA under an Emergency Use Authorization (EUA). This EUA will remain in effect (meaning this test can be used) for the duration of the COVID-19 declaration under Section 564(b)(1) of the Act, 21 U.S.C. section 360bbb-3(b)(1), unless the authorization is terminated or revoked.  Performed at [facility], [address] [place], [place] [address]   Respiratory (~20 pathogens) panel by PCR     Status: None   Collection Time: 01/23/24 10:33 PM   Specimen: Nasopharyngeal Swab; Respiratory  Result Value Ref Range Status   Adenovirus NOT DETECTED NOT DETECTED Final   Coronavirus 229E NOT DETECTED NOT DETECTED Final    Comment: (NOTE) The Coronavirus on the Respiratory Panel, DOES NOT test for the novel  Coronavirus (2019 nCoV)    Coronavirus HKU1 NOT DETECTED NOT DETECTED Final   Coronavirus NL63 NOT DETECTED NOT  DETECTED Final   Coronavirus OC43 NOT DETECTED NOT DETECTED Final   Metapneumovirus NOT DETECTED NOT DETECTED Final   Rhinovirus / Enterovirus NOT DETECTED NOT DETECTED Final   Influenza A NOT DETECTED NOT DETECTED Final   Influenza B NOT DETECTED NOT DETECTED Final   Parainfluenza Virus 1 NOT DETECTED NOT DETECTED Final   Parainfluenza Virus 2 NOT DETECTED NOT DETECTED Final   Parainfluenza Virus 3 NOT DETECTED NOT DETECTED Final   Parainfluenza Virus 4 NOT DETECTED NOT DETECTED Final   Respiratory Syncytial Virus NOT DETECTED NOT DETECTED Final   Bordetella pertussis NOT DETECTED NOT DETECTED Final   Bordetella Parapertussis NOT DETECTED NOT DETECTED Final   Chlamydophila pneumoniae NOT DETECTED NOT DETECTED Final   Mycoplasma pneumoniae NOT DETECTED NOT DETECTED Final    Comment: Performed at [facility], [address], [place], [place] [address]  Culture, blood (Routine X 2) w Reflex to ID Panel     Status: Abnormal   Collection Time: 01/24/24 12:14 AM   Specimen: BLOOD LEFT HAND  Result Value Ref Range Status   Specimen Description   Final    BLOOD LEFT HAND Performed at [facility], [address] [place], [place] [address]    Special Requests   Final    BOTTLES DRAWN AEROBIC AND ANAEROBIC Blood Culture results may not be optimal due to an inadequate volume of blood received in culture bottles Performed at [facility], [address] [place], [place] [address]    Culture  Setup Time   Final    GRAM POSITIVE COCCI IN BOTH AEROBIC AND ANAEROBIC BOTTLES CRITICAL RESULT CALLED TO, READ BACK BY AND VERIFIED WITH: MAYA CHARLENA NAPOLEON [id number] @ 2318 FH Performed at [facility], [address], [place], [place] [address]    Culture ENTEROCOCCUS FAECALIS (A)  Final   Report Status 01/26/2024 FINAL  Final   Organism ID, Bacteria ENTEROCOCCUS FAECALIS  Final      Susceptibility   Enterococcus faecalis - MIC*    AMPICILLIN  <=2 SENSITIVE Sensitive      VANCOMYCIN  1 SENSITIVE Sensitive     GENTAMICIN SYNERGY SENSITIVE Sensitive     * ENTEROCOCCUS FAECALIS  Blood Culture ID Panel (Reflexed)     Status: Abnormal   Collection Time: 01/24/24 12:14 AM  Result Value Ref Range Status   Enterococcus faecalis DETECTED (A) NOT DETECTED Final    Comment: CRITICAL RESULT CALLED TO, READ BACK BY AND VERIFIED WITH: PHARMD ESABRA NAPOLEON [id number] @  2318 FH    Enterococcus Faecium NOT DETECTED NOT DETECTED Final   Listeria monocytogenes NOT DETECTED NOT DETECTED Final   Staphylococcus species NOT DETECTED NOT DETECTED Final   Staphylococcus aureus (BCID) NOT DETECTED NOT DETECTED Final   Staphylococcus epidermidis NOT DETECTED NOT DETECTED Final   Staphylococcus lugdunensis NOT DETECTED NOT DETECTED Final   Streptococcus species NOT DETECTED NOT DETECTED Final   Streptococcus agalactiae NOT DETECTED NOT DETECTED Final   Streptococcus pneumoniae NOT DETECTED NOT DETECTED Final   Streptococcus pyogenes NOT DETECTED NOT DETECTED Final   A.calcoaceticus-baumannii NOT DETECTED NOT DETECTED Final   Bacteroides fragilis NOT DETECTED NOT DETECTED Final   Enterobacterales NOT DETECTED NOT DETECTED Final   Enterobacter cloacae complex NOT DETECTED NOT DETECTED Final   Escherichia coli NOT DETECTED NOT DETECTED Final   Klebsiella aerogenes NOT DETECTED NOT DETECTED Final   Klebsiella oxytoca NOT DETECTED NOT DETECTED Final   Klebsiella pneumoniae NOT DETECTED NOT DETECTED Final   Proteus species NOT DETECTED NOT DETECTED Final   Salmonella species NOT DETECTED NOT DETECTED Final   Serratia marcescens NOT DETECTED NOT DETECTED Final   Haemophilus influenzae NOT DETECTED NOT DETECTED Final   Neisseria meningitidis NOT DETECTED NOT DETECTED Final   Pseudomonas aeruginosa NOT DETECTED NOT DETECTED Final   Stenotrophomonas maltophilia NOT DETECTED NOT DETECTED Final   Candida albicans NOT DETECTED NOT DETECTED Final   Candida auris NOT DETECTED NOT DETECTED  Final   Candida glabrata NOT DETECTED NOT DETECTED Final   Candida krusei NOT DETECTED NOT DETECTED Final   Candida parapsilosis NOT DETECTED NOT DETECTED Final   Candida tropicalis NOT DETECTED NOT DETECTED Final   Cryptococcus neoformans/gattii NOT DETECTED NOT DETECTED Final   Vancomycin  resistance NOT DETECTED NOT DETECTED Final    Comment: Performed at [facility], [address], [place], [place] [address]  MRSA Next Gen by PCR, Nasal     Status: None   Collection Time: 01/24/24  2:03 AM   Specimen: Nasal Mucosa; Nasal Swab  Result Value Ref Range Status   MRSA by PCR Next Gen NOT DETECTED NOT DETECTED Final    Comment: (NOTE) The GeneXpert MRSA Assay (FDA approved for NASAL specimens only), is one component of a comprehensive MRSA colonization surveillance program. It is not intended to diagnose MRSA infection nor to guide or monitor treatment for MRSA infections. Test performance is not FDA approved in patients less than 80 years old. Performed at [facility], [address] [place], [place] [address]   Aerobic/Anaerobic Culture w Gram Stain (surgical/deep wound)     Status: None (Preliminary result)   Collection Time: 01/26/24 12:27 PM   Specimen: Abscess  Result Value Ref Range Status   Specimen Description   Final    ABSCESS Performed at [facility], [address] [place], [place] [address]    Special Requests   Final    Normal WD Performed at [facility], [address] [place], [place] [address]    Gram Stain   Final    MODERATE WBC PRESENT, PREDOMINANTLY PMN MODERATE GRAM POSITIVE COCCI    Culture   Final    TOO YOUNG TO READ Performed at [facility], [address] [place], [place] [address]    Report Status PENDING  Incomplete    Coagulation Studies: Recent Labs    01/26/24 0949  LABPROT 19.8*  INR 1.6*    Urinalysis: No results for input(s): COLORURINE, LABSPEC, PHURINE, GLUCOSEU,  HGBUR,  BILIRUBINUR, KETONESUR, PROTEINUR, UROBILINOGEN, NITRITE, LEUKOCYTESUR in the last 72 hours.  Invalid input(s): APPERANCEUR    Imaging: CT GUIDED PERITONEAL/RETROPERITONEAL FLUID DRAIN BY PERC CATH Result Date: 01/26/2024 INDICATION: 84 year old male with sepsis and new low-attenuation fluid collection versus liquified infarct in the spleen. He presents for CT-guided aspiration with possible drain placement. EXAM: CT-guided drain placement TECHNIQUE: Multidetector CT imaging of the abdomen was performed following the standard protocol without IV contrast. RADIATION DOSE REDUCTION: This exam was performed according to the departmental dose-optimization program which includes automated exposure control, adjustment of the mA and/or kV according to patient size and/or use of iterative reconstruction technique. MEDICATIONS: The patient is currently admitted to the hospital and receiving intravenous antibiotics. The antibiotics were administered within an appropriate time frame prior to the initiation of the procedure. ANESTHESIA/SEDATION: Moderate (conscious) sedation was employed during this procedure. A total of Versed  0.5 mg and Fentanyl  25 mcg was administered intravenously by the radiology nurse. Total intra-service moderate Sedation Time: 10 minutes. The patient's level of consciousness and vital signs were monitored continuously by radiology nursing throughout the procedure under my direct supervision. COMPLICATIONS: None immediate. PROCEDURE: Informed written consent was obtained from the patient after a thorough discussion of the procedural risks, benefits and alternatives. All questions were addressed. Maximal Sterile Barrier Technique was utilized including caps, mask, sterile gowns, sterile gloves, sterile drape, hand hygiene and skin antiseptic. A timeout was performed prior to the initiation of the procedure. A planning axial CT scan was performed. The low-attenuation region in  the spleen was identified. A skin entry site was selected and marked. The skin was sterilely prepped and draped in the standard fashion using chlorhexidine  skin prep. Local anesthesia was attained by infiltration with 1% lidocaine . A small dermatotomy was made. Under intermittent CT guidance, an 18 gauge trocar needle was advanced into the fluid collection. Aspiration yields foul-smelling thin reddish brown fluid. It is unclear if this represents liquified hematoma or simple abscess. Regardless, the foul small suggests infection and therefore drain placement will be performed. A 0.035 wire was coiled in the collection. The trocar needle was removed. The percutaneous tract was dilated to 12 French. A 12 French all-purpose drainage catheter was advanced over the wire and formed. Aspiration yields 90 mL of the brownish red foul-smelling fluid. Samples were sent for Gram stain and culture. Follow-up CT imaging demonstrates a well-positioned drainage catheter with no evidence of hemorrhage. The pleura remains cephalad to the drain. No evidence of transgression. IMPRESSION: Successful placement of a 12 French drainage catheter into the splenic fluid collection yielding 90 mL of reddish brown foul-smelling fluid. Samples were sent for Gram stain and culture. PLAN: Maintain drain to JP bulb suction. Do not flush. Recommend removal of drainage catheter as soon as possible (once output is scant for 48 hours). Electronically Signed   By: Wilkie Lent M.D.   On: 01/26/2024 12:55   CT Angio Abd/Pel w/ and/or w/o Result Date: 01/26/2024 CLINICAL DATA:  Splenic fluid collection. Possible infarct versus abscess. EXAM: CTA ABDOMEN AND PELVIS WITHOUT AND WITH CONTRAST TECHNIQUE: Multidetector CT imaging of the abdomen and pelvis was performed using the standard protocol during bolus administration of intravenous contrast. Multiplanar reconstructed images and MIPs were obtained and reviewed to evaluate the vascular anatomy.  RADIATION DOSE REDUCTION: This exam was performed according to the departmental dose-optimization program which includes automated exposure control, adjustment of the mA and/or kV according to patient size and/or use of iterative reconstruction technique. CONTRAST:  100mL OMNIPAQUE  IOHEXOL   350 MG/ML SOLN COMPARISON:  CT scan without contrast performed yesterday 01/25/2024 FINDINGS: VASCULAR Aorta: Normal caliber aorta without aneurysm, dissection, vasculitis or significant stenosis. Scattered atherosclerotic vascular calcifications. 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 The left hepatic artery is replaced to the left gastric artery. No aneurysm or dissection. SMA: Patent without evidence of aneurysm, dissection, vasculitis or significant stenosis. Renals: Solitary renal arteries bilaterally. Calcified plaque results in high-grade stenosis of the left renal artery. The right renal artery is patent but relatively small in caliber. No aneurysm or changes of FMD. IMA: Patent without evidence of aneurysm, dissection, vasculitis or significant stenosis. Inflow: Patent without evidence of aneurysm, dissection, vasculitis or significant stenosis. Proximal Outflow: Bilateral common femoral and visualized portions of the superficial and profunda femoral arteries are patent without evidence of aneurysm, dissection, vasculitis or significant stenosis. Veins: Widely patent portal, splenic and visceral veins. Review of the MIP images confirms the above findings. NON-VASCULAR Lower chest: Dependent atelectasis bilaterally. Cardiomegaly. Calcified atherosclerotic plaque along the coronary arteries. Thickening calcified aortic valve. Hepatobiliary: Normal hepatic contour and morphology. No discrete hepatic lesions. Normal appearance of the gallbladder. No intra or extrahepatic biliary ductal dilatation. Pancreas: Unremarkable. No pancreatic ductal dilatation  or surrounding inflammatory changes. Spleen: Oblong rounded hypoechoic collection in the subcapsular aspect of the spleen extends from cranial to caudal and measures 12.0 x 4.2 x 4.4 cm. The collection is circumscribed. No evidence of peripheral enhancement or hyperemia. There is a second smaller wedge-shaped focus of ill-defined low attenuation in the anterior lower pole of the spleen. Overall, these findings suggest a combination of acute and subacute splenic infarcts. Adrenals/Urinary Tract: Normal adrenal glands. 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 Similar findings have been noted on multiple prior examinations. Stomach/Bowel: Stomach is within normal limits. Appendix appears normal. No evidence of bowel wall thickening, distention, or inflammatory changes. Lymphatic: No suspicious lymphadenopathy. Reproductive: Prostatomegaly. Other: No abdominal wall hernia or abnormality. No abdominopelvic ascites. Musculoskeletal: No acute fracture or aggressive appearing lytic or blastic osseous lesion. Lower lumbar degenerative disc disease and facet arthropathy. IMPRESSION: 1. Oblong low-attenuation fluid collection in the subcapsular periphery of the spleen extending from cranial to caudal. There is a second smaller wedge-shaped and less well-defined region of decreased enhancement in the inferior and anterior aspect of the splenic tip. Overall findings are most suggestive of a combination of acute and subacute splenic infarct. No peripheral enhancement, hyperemia or internal gas to suggest the presence of infection. 2. 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 3. 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 4. Extensive cystic changes of both kidneys with cysts of variable density and complexity as seen on prior imaging. As before, outpatient MRI with gadolinium contrast could be considered to exclude neoplasm if clinically  warranted. 5. Additional ancillary findings as above. Electronically Signed   By: Wilkie Lent M.D.   On: 01/26/2024 08:46   MR BRAIN WO CONTRAST Result Date: 01/25/2024 EXAM: MRI BRAIN WITHOUT CONTRAST 01/25/2024 02:21:13 PM TECHNIQUE: Multiplanar multisequence MRI of the head/brain was performed without the administration of intravenous contrast. COMPARISON: None available. CLINICAL HISTORY: Mental status change, unknown cause. The patient presents with a mental status change of unknown cause. 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 T2 hyperintensities in the cerebral white matter bilaterally are nonspecific but compatible with mild chronic  small vessel ischemic disease. There is mild to moderate cerebral atrophy. No intracranial hemorrhage, mass, midline shift, hydrocephalus, or extra-axial fluid collection is identified. Major intracranial vascular flow voids are preserved. ORBITS: No significant abnormality. SINUSES AND MASTOIDS: No significant abnormality. BONES AND SOFT TISSUES: Normal marrow signal. No significant soft tissue abnormality. IMPRESSION: 1. 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 2. Mild chronic small vessel ischemic disease. Electronically signed by: Dasie Hamburg MD 01/25/2024 04:40 PM EST RP Workstation: [id number]   ECHOCARDIOGRAM COMPLETE Result Date: 01/25/2024    ECHOCARDIOGRAM REPORT   Patient Name:   Jeffery Joseph Date of Exam: 01/25/2024 Medical Rec #:  [id number]     Height:       77.0 in Accession #:    [id number]    Weight:       219.1 lb Date of Birth:  [birth year]     BSA:          2.325 m Patient Age:    83 years      BP:           111/47 mmHg Patient Gender: M             HR:           113 bpm. Exam Location:  [facility] Procedure: 2D Echo, Cardiac Doppler, Color Doppler, 3D Echo and Strain Analysis            (Both Spectral and Color Flow Doppler were utilized during            procedure).  Indications:     Shock R57.9  History:         Patient has prior history of Echocardiogram examinations, most                  recent 07/18/2023. Risk Factors:Diabetes and Hypertension.  Sonographer:     Christopher Furnace Referring Phys:  [id number] LONELL KANDICE MOOSE Diagnosing Phys: Cara JONETTA Lovelace MD  Sonographer Comments: Global longitudinal strain was attempted. IMPRESSIONS  1. Left ventricular ejection fraction, by estimation, is 45 to 50%. The left ventricle has mildly decreased function. The left ventricle demonstrates global hypokinesis. The left ventricular internal cavity size was mildly dilated. There is mild asymmetric left ventricular hypertrophy. Left ventricular diastolic parameters are consistent with Grade II diastolic dysfunction (pseudonormalization). The average left ventricular global longitudinal strain is 6.7 %. The global longitudinal strain is abnormal.  2. Right ventricular systolic function is low normal. The right ventricular size is mildly enlarged.  3. Left atrial size was mildly dilated.  4. The mitral valve is grossly normal. Mild to moderate mitral valve regurgitation.  5. Tricuspid valve regurgitation is mild to moderate.  6. The aortic valve is calcified. Aortic valve regurgitation is mild. Mild aortic valve stenosis. FINDINGS  Left Ventricle: Left ventricular ejection fraction, by estimation, is 45 to 50%. The left ventricle has mildly decreased function. The left ventricle demonstrates global hypokinesis. The average left ventricular global longitudinal strain is 6.7 %. Strain was performed and the global longitudinal strain is abnormal. The left ventricular internal cavity size was mildly dilated. There is mild asymmetric left ventricular hypertrophy. Left ventricular diastolic parameters are consistent with Grade II diastolic dysfunction (pseudonormalization). Right Ventricle: The right ventricular size is mildly enlarged. No increase in right ventricular wall thickness. Right ventricular  systolic function is low normal. Left Atrium: Left atrial size was mildly dilated. Right Atrium: Right atrial size was normal in size. Pericardium: There is no evidence of  pericardial effusion. Mitral Valve: The mitral valve is grossly normal. Mild to moderate mitral valve regurgitation. MV peak gradient, 12.4 mmHg. The mean mitral valve gradient is 6.0 mmHg. Tricuspid Valve: The tricuspid valve is normal in structure. Tricuspid valve regurgitation is mild to moderate. Aortic Valve: The aortic valve is calcified. Aortic valve regurgitation is mild. Aortic regurgitation PHT measures 206 msec. Mild aortic stenosis is present. Aortic valve mean gradient measures 16.2 mmHg. Aortic valve peak gradient measures 30.7 mmHg. Aortic valve area, by VTI measures 1.51 cm. Pulmonic Valve: The pulmonic valve was not well visualized. Pulmonic valve regurgitation is not visualized. Aorta: The aortic root was not well visualized. IAS/Shunts: No atrial level shunt detected by color flow Doppler. Additional Comments: 3D was performed not requiring image post processing on an independent workstation and was abnormal.  LEFT VENTRICLE PLAX 2D LVIDd:         5.20 cm      Diastology LVIDs:         3.90 cm      LV e' medial:    3.59 cm/s LV PW:         1.10 cm      LV E/e' medial:  39.0 LV IVS:        1.40 cm      LV e' lateral:   8.27 cm/s LVOT diam:     2.00 cm      LV E/e' lateral: 16.9 LV SV:         62 LV SV Index:   26           2D Longitudinal Strain LVOT Area:     3.14 cm     2D Strain GLS (A4C):   5.9 % LV IVRT:       48 msec      2D Strain GLS (A3C):   6.1 %                             2D Strain GLS (A2C):   8.2 %                             2D Strain GLS Avg:     6.7 % LV Volumes (MOD) LV vol d, MOD A2C: 147.0 ml LV vol d, MOD A4C: 112.0 ml LV vol s, MOD A2C: 83.5 ml LV vol s, MOD A4C: 72.4 ml LV SV MOD A2C:     63.5 ml LV SV MOD A4C:     112.0 ml LV SV MOD BP:      47.7 ml RIGHT VENTRICLE RV Basal diam:  4.00 cm RV Mid diam:     3.50 cm RV S prime:     11.90 cm/s TAPSE (M-mode): 1.8 cm LEFT ATRIUM             Index        RIGHT ATRIUM           Index LA diam:        4.00 cm 1.72 cm/m   RA Area:     15.20 cm LA Vol (A2C):   97.8 ml 42.06 ml/m  RA Volume:   31.10 ml  13.37 ml/m LA Vol (A4C):   91.0 ml 39.13 ml/m LA Biplane Vol: 96.0 ml 41.28 ml/m  AORTIC VALVE AV Area (Vmax):    1.27 cm AV Area (Vmean):   1.28 cm AV Area (  VTI):     1.51 cm AV Vmax:           277.25 cm/s AV Vmean:          183.000 cm/s AV VTI:            0.409 m AV Peak Grad:      30.7 mmHg AV Mean Grad:      16.2 mmHg LVOT Vmax:         112.00 cm/s LVOT Vmean:        74.500 cm/s LVOT VTI:          0.196 m LVOT/AV VTI ratio: 0.48 AI PHT:            206 msec  AORTA Ao Root diam: 2.70 cm MITRAL VALVE                  TRICUSPID VALVE MV Area (PHT): 5.31 cm       TR Peak grad:   26.4 mmHg MV Area VTI:   2.15 cm       TR Vmax:        257.00 cm/s MV Peak grad:  12.4 mmHg MV Mean grad:  6.0 mmHg       SHUNTS MV Vmax:       1.76 m/s       Systemic VTI:  0.20 m MV Vmean:      119.0 cm/s     Systemic Diam: 2.00 cm MV Decel Time: 143 msec MR Peak grad:    108.6 mmHg MR Mean grad:    74.0 mmHg MR Vmax:         521.00 cm/s MR Vmean:        418.0 cm/s MR PISA:         4.02 cm MR PISA Eff ROA: 25 mm MR PISA Radius:  0.80 cm MV E velocity: 140.00 cm/s MV A velocity: 114.00 cm/s MV E/A ratio:  1.23 Dwayne D Callwood MD Electronically signed by Cara JONETTA Lovelace MD Signature Date/Time: 01/25/2024/2:49:15 PM    Final    CT ABDOMEN PELVIS WO CONTRAST Result Date: 01/25/2024 EXAM: CT ABDOMEN AND PELVIS WITHOUT CONTRAST 01/25/2024 11:06:03 AM TECHNIQUE: CT of the abdomen and pelvis was performed without the administration of intravenous contrast. Multiplanar reformatted images are provided for review. Automated exposure control, iterative reconstruction, and/or weight-based adjustment of the mA/kV was utilized to reduce the radiation dose to as low as reasonably achievable.  COMPARISON: 09/07/2023 CLINICAL HISTORY: Sepsis. FINDINGS: LOWER CHEST: Coronary and aortic atherosclerosis with moderate cardiomegaly. Trace bilateral pleural effusions with mild atelectasis in the lung bases. Hazy airspace opacity medially in the left lower lobe on image 9 series 4 is nonspecific, early bronchopneumonia not excluded. LIVER: The liver is unremarkable. GALLBLADDER AND BILE DUCTS: Contracted gallbladder. No biliary ductal dilatation. SPLEEN: Abnormal hypodense lesions in the spleen measuring up to 10.5 x 4.5 x 4.0 cm (volume = 99 cm\S\3), not present on 09/07/2023. Splenic abscess is not excluded. PANCREAS: No acute abnormality. ADRENAL GLANDS: No acute abnormality. KIDNEYS, URETERS AND BLADDER: Scattered hypodense and hyperdense renal lesions similar to previous, suspicious for polycystic kidney disease. Enhancement not assessed on today's noncontrast examination. Overall morphology similar to the 09/07/2023 exam. No stones in the kidneys or ureters. No hydronephrosis. No perinephric or periureteral stranding. Urinary bladder is unremarkable. GI AND BOWEL: Stomach demonstrates no acute abnormality. Stable dilated appendix at 9 mm diameter on image 73 series 2 with internal high density suspicious for appendicolith, similar appearance on  09/07/2023. Given the chronic prominence, an underlying appendiceal mass cannot be readily excluded. No current periappendiceal inflammatory findings. There is no bowel obstruction. PERITONEUM AND RETROPERITONEUM: No ascites. No free air. New nonspecific presacral edema. VASCULATURE: Aorta is normal in caliber. Atherosclerosis atheromatous plaque at the origin of the superior mesenteric artery. LYMPH NODES: No lymphadenopathy. REPRODUCTIVE ORGANS: Stable prostatomegaly. BONES AND SOFT TISSUES: Low level subcutaneous edema along the flanks. Bridging spurring of both sacroiliac joints. 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 Grade 1 degenerative  anterolisthesis at L4-L5 with lower lumbar spondylosis and degenerative disc disease and suspected bilateral foraminal stenosis at L3-L4 and L4-L5 with borderline bilateral foraminal stenosis at L5-S1. A small umbilical hernia contains adipose tissue. IMPRESSION: 1. Abnormal hypodense lesions in the spleen measuring up to 10.5 x 4.5 x 4.0 cm (volume = 99 cm\S\3), not present on prior exam, raising concern for splenic abscesses or hematomas. 2. Stable dilated appendix at 9 mm diameter with internal high density suspicious for appendicolith. Given the chronic prominence, an underlying appendiceal mass cannot be readily excluded. 3. Hazy airspace opacity medially in the left lower lobe, nonspecific, with early bronchopneumonia not excluded. 4. Scattered hypodense and hyperdense renal lesions are suspicious for polycystic kidney disease. 5. New nonspecific presacral edema. 6. 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 7. Grade 1 degenerative anterolisthesis at L4-5 with lower lumbar spondylosis and degenerative disc disease and suspected bilateral foraminal stenosis at L3-4 and L4-5 with borderline bilateral foraminal stenosis at L5-S1. 8. Coronary and aortic atherosclerosis with moderate cardiomegaly. 9. Trace bilateral pleural effusions with mild atelectasis in the lung bases. 10. Stable prostatomegaly. Impression #1 will be telephoned to the referring provider or the referring provider's representative by professional radiology assistant [facility]) personnel, with communication documented in the clario dashboard. Electronically signed by: Ryan Salvage MD MD 01/25/2024 11:37 AM EST RP Workstation: [id number]     Medications:    sodium chloride      ampicillin  (OMNIPEN) IV Stopped (01/26/24 2302)   cefTRIAXone  (ROCEPHIN )  IV Stopped (01/27/24 0116)   norepinephrine  (LEVOPHED ) Adult infusion 9 mcg/min (01/27/24 0728)    amiodarone   100 mg Oral Daily   Chlorhexidine  Gluconate Cloth  6 each  Topical Daily   cholecalciferol   5,000 Units Oral Daily   diclofenac  Sodium  2 g Topical QID   docusate sodium   100 mg Oral QHS   donepezil   10 mg Oral QHS   finasteride   5 mg Oral Daily   insulin  aspart  0-15 Units Subcutaneous TID WC   insulin  aspart  0-5 Units Subcutaneous QHS   midodrine   10 mg Oral TID WC   pantoprazole  (PROTONIX ) IV  40 mg Intravenous Daily   tamsulosin   0.4 mg Oral Daily   acetaminophen  **OR** acetaminophen , ALPRAZolam , fentaNYL  (SUBLIMAZE ) injection, lidocaine  (PF), magnesium  hydroxide, nitroGLYCERIN , ondansetron  **OR** ondansetron  (ZOFRAN ) IV, mouth rinse, traZODone   Assessment/ Plan:  Jeffery Joseph is a 84 y.o.  male  with ESRD on hemodialysis, hypertension, diabetes mellitus type II, BPH, chronic afib, peripheral arterial disease, and diastolic congestive heart failure who presented to hospital with atypical chest pain found to have hgb 4.5 and elevated troponin.   Outpatient dialysis [facility] [address]/MWF/AVF and is followed by Dr. Marcelino  End stage Renal Disease on hemodialysis  Patient completed dialysis yesterday.  No acute indication for dialysis treatment today.  Will plan for hemodialysis again tomorrow.  Anemia with chronic kidney disease and suspected acute blood loss.  Hgb 4.5, POA. Suspect GI bleed. Fecal occult positive.  Hemoglobin improved slightly to 8.9.  Latest Reference Range & Units 01/23/24 03:23  Iron 45 - 182 ug/dL 29 (L)  UIBC ug/dL 850  TIBC 749 - 549 ug/dL 821 (L)  Saturation Ratios 17.9 - 39.5 % 16 (L)  Ferritin 24 - 336 ng/mL 882 (H)  (L): Data is abnormally low (H): Data is abnormally high    Hypotension:   Patient remains on norepinephrine  at this time.  Continue to monitor blood pressure trend.   Diabetes mellitus type II with chronic kidney disease  Most recent A1C 5.9 01/23/24. SSI managed by primary team  5. Chronic Afib  Patient on amiodarone .  6.  Possible splenic abscess/fluid collection.  Patient is status  post splenic drain placement 01/26/2024.   LOS: 4 Wetzel Meester 1/15/20267:45 AM  "

## 2024-01-27 NOTE — Plan of Care (Signed)
" °  Problem: Education: Goal: Ability to describe self-care measures that may prevent or decrease complications (Diabetes Survival Skills Education) will improve Outcome: Progressing Goal: Individualized Educational Video(s) Outcome: Progressing   Problem: Coping: Goal: Ability to adjust to condition or change in health will improve Outcome: Progressing   Problem: Fluid Volume: Goal: Ability to maintain a balanced intake and output will improve Outcome: Progressing   Problem: Metabolic: Goal: Ability to maintain appropriate glucose levels will improve Outcome: Progressing   Problem: Nutritional: Goal: Maintenance of adequate nutrition will improve Outcome: Progressing Goal: Progress toward achieving an optimal weight will improve Outcome: Progressing   Problem: Skin Integrity: Goal: Risk for impaired skin integrity will decrease Outcome: Progressing   Problem: Tissue Perfusion: Goal: Adequacy of tissue perfusion will improve Outcome: Progressing   Problem: Education: Goal: Knowledge of General Education information will improve Description: Including pain rating scale, medication(s)/side effects and non-pharmacologic comfort measures Outcome: Progressing   Problem: Clinical Measurements: Goal: Ability to maintain clinical measurements within normal limits will improve Outcome: Progressing Goal: Will remain free from infection Outcome: Progressing Goal: Diagnostic test results will improve Outcome: Progressing Goal: Respiratory complications will improve Outcome: Progressing Goal: Cardiovascular complication will be avoided Outcome: Progressing   Problem: Activity: Goal: Risk for activity intolerance will decrease Outcome: Progressing   Problem: Coping: Goal: Level of anxiety will decrease Outcome: Progressing   Problem: Elimination: Goal: Will not experience complications related to bowel motility Outcome: Progressing Goal: Will not experience complications  related to urinary retention Outcome: Progressing   Problem: Pain Managment: Goal: General experience of comfort will improve and/or be controlled Outcome: Progressing   Problem: Safety: Goal: Ability to remain free from injury will improve Outcome: Progressing   Problem: Skin Integrity: Goal: Risk for impaired skin integrity will decrease Outcome: Progressing   "

## 2024-01-27 NOTE — Progress Notes (Signed)
 "  Date of Admission:  01/22/2024     ID: Jeffery Joseph is a 84 y.o. male Principal Problem:   Chest pain Active Problems:   Chronic atrial fibrillation (HCC)   Type 2 diabetes mellitus with chronic kidney disease, without long-term current use of insulin  (HCC)   BPH (benign prostatic hyperplasia)   End-stage renal disease on hemodialysis (HCC)   Influenza   Pressure injury of skin   Bacteremia   Splenic abscess   Bacteremia due to Enterococcus    Subjective: Patient is stable  Medications:   amiodarone   100 mg Oral Daily   Chlorhexidine  Gluconate Cloth  6 each Topical Daily   cholecalciferol   5,000 Units Oral Daily   diclofenac  Sodium  2 g Topical QID   docusate sodium   100 mg Oral QHS   donepezil   10 mg Oral QHS   finasteride   5 mg Oral Daily   insulin  aspart  0-15 Units Subcutaneous TID WC   insulin  aspart  0-5 Units Subcutaneous QHS   midodrine   10 mg Oral TID WC   pantoprazole  (PROTONIX ) IV  40 mg Intravenous Daily   tamsulosin   0.4 mg Oral Daily    Objective: Vital signs in last 24 hours: Patient Vitals for the past 24 hrs:  BP Temp Temp src Pulse Resp SpO2 Weight  01/27/24 1100 -- 97.7 F (36.5 C) Oral -- -- -- --  01/27/24 0730 (!) 107/41 98.5 F (36.9 C) Oral (!) 111 15 97 % --  01/27/24 0715 (!) 100/46 -- -- (!) 110 17 96 % --  01/27/24 0700 (!) 99/44 -- -- (!) 111 18 98 % --  01/27/24 0615 (!) 104/41 -- -- (!) 111 16 97 % --  01/27/24 0600 (!) 104/41 -- -- (!) 111 16 96 % --  01/27/24 0545 (!) 105/40 -- -- (!) 112 16 98 % --  01/27/24 0530 (!) 107/40 -- -- (!) 111 16 94 % --  01/27/24 0515 (!) 109/38 -- -- (!) 110 16 95 % --  01/27/24 0500 (!) 112/42 -- -- (!) 112 (!) 23 98 % --  01/27/24 0445 (!) 110/38 -- -- (!) 111 18 99 % --  01/27/24 0430 (!) 109/40 -- -- (!) 111 (!) 21 97 % --  01/27/24 0415 (!) 107/35 -- -- (!) 111 14 92 % --  01/27/24 0400 (!) 113/37 -- -- (!) 112 (!) 22 (!) 87 % --  01/27/24 0345 (!) 102/42 -- -- (!) 111 (!) 22 91 % --   01/27/24 0330 (!) 109/50 -- -- (!) 113 19 94 % --  01/27/24 0315 (!) 103/42 -- -- (!) 113 16 98 % --  01/27/24 0300 104/76 -- -- (!) 113 (!) 23 97 % --  01/27/24 0245 (!) 103/43 -- -- (!) 113 (!) 26 96 % --  01/27/24 0230 (!) 99/43 -- -- (!) 114 17 90 % --  01/27/24 0215 (!) 103/42 -- -- (!) 113 (!) 23 95 % --  01/27/24 0200 (!) 102/43 -- -- (!) 115 17 94 % --  01/27/24 0145 (!) 97/40 -- -- (!) 115 18 95 % --  01/27/24 0130 (!) 103/42 -- -- (!) 115 (!) 23 97 % --  01/27/24 0115 (!) 94/42 -- -- (!) 115 19 95 % --  01/27/24 0100 (!) 104/39 -- -- (!) 115 (!) 21 95 % --  01/27/24 0045 (!) 115/42 -- -- (!) 113 (!) 25 95 % --  01/27/24 0030 (!) 106/44 -- -- [name]  113 (!) 24 93 % --  01/27/24 0015 (!) 65/53 -- -- (!) 112 (!) 24 93 % --  01/26/24 2330 (!) 103/41 -- -- (!) 113 (!) 27 96 % --  01/26/24 2315 (!) 92/40 -- -- (!) 115 (!) 24 93 % --  01/26/24 2300 (!) 110/45 -- -- (!) 115 (!) 24 91 % --  01/26/24 2245 (!) 112/45 -- -- (!) 116 (!) 23 93 % --  01/26/24 2230 (!) 106/41 -- -- (!) 116 (!) 21 92 % --  01/26/24 2215 (!) 118/43 -- -- (!) 116 (!) 24 (!) 89 % --  01/26/24 2200 (!) 112/47 -- -- (!) 116 (!) 25 94 % --  01/26/24 2145 (!) 109/45 -- -- (!) 116 (!) 23 93 % --  01/26/24 2130 (!) 112/46 -- -- (!) 116 (!) 28 91 % --  01/26/24 2115 (!) 104/46 -- -- (!) 117 (!) 22 93 % --  01/26/24 2100 (!) 103/46 -- -- (!) 116 (!) 25 95 % --  01/26/24 2045 (!) 105/47 97.9 F (36.6 C) Tympanic (!) 116 (!) 25 97 % --  01/26/24 2030 (!) 106/39 -- -- (!) 117 17 97 % --  01/26/24 2015 (!) 105/49 -- -- (!) 117 (!) 21 97 % --  01/26/24 2000 (!) 108/46 -- -- (!) 116 (!) 22 97 % --  01/26/24 1945 (!) 106/49 -- -- (!) 116 (!) 25 96 % --  01/26/24 1935 (!) 107/41 97.9 F (36.6 C) Oral (!) 116 (!) 24 96 % 101 kg  01/26/24 1930 (!) 109/51 -- -- -- 18 95 % --  01/26/24 1915 (!) 113/48 -- -- (!) 117 18 93 % --  01/26/24 1900 (!) 111/45 -- -- (!) 117 (!) 22 92 % --  01/26/24 1845 (!) 111/46 -- -- (!) 117 (!) 23  93 % --  01/26/24 1830 (!) 109/43 -- -- (!) 116 (!) 23 92 % --  01/26/24 1815 (!) 113/46 -- -- (!) 116 (!) 23 96 % --  01/26/24 1800 (!) 111/48 -- -- (!) 115 (!) 24 93 % --  01/26/24 1745 (!) 107/45 -- -- (!) 115 20 93 % --  01/26/24 1730 (!) 112/45 -- -- (!) 114 (!) 21 93 % --  01/26/24 1715 (!) 113/44 -- -- (!) 116 (!) 23 92 % --  01/26/24 1701 (!) 117/44 -- -- (!) 113 18 94 % --  01/26/24 1645 (!) 115/46 -- -- (!) 113 (!) 23 92 % --  01/26/24 1644 (!) 119/45 (!) 97.4 F (36.3 C) Oral (!) 113 (!) 22 92 % 101.6 kg  01/26/24 1630 (!) 119/45 -- -- (!) 113 (!) 23 93 % --  01/26/24 1615 (!) 113/41 -- -- (!) 113 18 94 % --  01/26/24 1600 (!) 122/41 -- -- (!) 112 (!) 22 93 % --  01/26/24 1545 (!) 113/42 -- -- (!) 111 20 92 % --  01/26/24 1530 (!) 110/35 97.6 F (36.4 C) Oral (!) 110 14 94 % --  01/26/24 1515 (!) 111/35 -- -- (!) 111 17 92 % --     Lines and Device Date on insertion # of days DC  Chiropractor     Foley     ETT       PHYSICAL EXAM:  General: Alert, cooperative, no distress, appears stated age.  Lungs: BiLateral air entry Heart: Tachycardic  abdomen: Soft, left quadrant drain- serosanguinous  Extremities: atraumatic, no cyanosis. No edema. No clubbing Skin:  No rashes or lesions. Or bruising Lymph: Cervical, supraclavicular normal. Neurologic: Grossly non-focal  Lab Results    Latest Ref Rng & Units 01/27/2024    5:05 AM 01/26/2024    3:27 AM 01/25/2024    1:09 PM  CBC  WBC 4.0 - 10.5 K/uL 8.5  10.5  11.3   Hemoglobin 13.0 - 17.0 g/dL 8.9  8.6  9.1   Hematocrit 39.0 - 52.0 % 28.5  27.1  28.6   Platelets 150 - 400 K/uL 227  193  202        Latest Ref Rng & Units 01/27/2024    5:05 AM 01/26/2024    3:27 AM 01/25/2024    3:30 AM  CMP  Glucose 70 - 99 mg/dL 864  862  852   BUN 8 - 23 mg/dL 26  39  35   Creatinine 0.61 - 1.24 mg/dL 7.02  6.22  6.37   Sodium 135 - 145 mmol/L 132  128  130   Potassium 3.5 - 5.1 mmol/L 3.8  3.5  3.3   Chloride 98  - 111 mmol/L 94  93  93   CO2 22 - 32 mmol/L 27  26  27    Calcium  8.9 - 10.3 mg/dL 8.8  8.3  8.3   Total Protein 6.5 - 8.1 g/dL   6.5   Total Bilirubin 0.0 - 1.2 mg/dL   0.7   Alkaline Phos 38 - 126 U/L   64   AST 15 - 41 U/L   22   ALT 0 - 44 U/L   33       Microbiology: 01/24/24 BC Enterococcus 4/4 Repeat blood culture  01/27/2023 Splenic abscess culture pending Gram stain shows gram-positive cocci  Studies/Results: CT GUIDED PERITONEAL/RETROPERITONEAL FLUID DRAIN BY PERC CATH Result Date: 01/26/2024 INDICATION: 84 year old male with sepsis and new low-attenuation fluid collection versus liquified infarct in the spleen. He presents for CT-guided aspiration with possible drain placement. EXAM: CT-guided drain placement TECHNIQUE: Multidetector CT imaging of the abdomen was performed following the standard protocol without IV contrast. RADIATION DOSE REDUCTION: This exam was performed according to the departmental dose-optimization program which includes automated exposure control, adjustment of the mA and/or kV according to patient size and/or use of iterative reconstruction technique. MEDICATIONS: The patient is currently admitted to the hospital and receiving intravenous antibiotics. The antibiotics were administered within an appropriate time frame prior to the initiation of the procedure. ANESTHESIA/SEDATION: Moderate (conscious) sedation was employed during this procedure. A total of Versed  0.5 mg and Fentanyl  25 mcg was administered intravenously by the radiology nurse. Total intra-service moderate Sedation Time: 10 minutes. The patient's level of consciousness and vital signs were monitored continuously by radiology nursing throughout the procedure under my direct supervision. COMPLICATIONS: None immediate. PROCEDURE: Informed written consent was obtained from the patient after a thorough discussion of the procedural risks, benefits and alternatives. All questions were addressed. Maximal  Sterile Barrier Technique was utilized including caps, mask, sterile gowns, sterile gloves, sterile drape, hand hygiene and skin antiseptic. A timeout was performed prior to the initiation of the procedure. A planning axial CT scan was performed. The low-attenuation region in the spleen was identified. A skin entry site was selected and marked. The skin was sterilely prepped and draped in the standard fashion using chlorhexidine  skin prep. Local anesthesia was attained by infiltration with 1% lidocaine . A small dermatotomy was made. Under intermittent CT guidance, an 18 gauge trocar needle was advanced into the fluid collection. Aspiration yields  foul-smelling thin reddish brown fluid. It is unclear if this represents liquified hematoma or simple abscess. Regardless, the foul small suggests infection and therefore drain placement will be performed. A 0.035 wire was coiled in the collection. The trocar needle was removed. The percutaneous tract was dilated to 12 French. A 12 French all-purpose drainage catheter was advanced over the wire and formed. Aspiration yields 90 mL of the brownish red foul-smelling fluid. Samples were sent for Gram stain and culture. Follow-up CT imaging demonstrates a well-positioned drainage catheter with no evidence of hemorrhage. The pleura remains cephalad to the drain. No evidence of transgression. IMPRESSION: Successful placement of a 12 French drainage catheter into the splenic fluid collection yielding 90 mL of reddish brown foul-smelling fluid. Samples were sent for Gram stain and culture. PLAN: Maintain drain to JP bulb suction. Do not flush. Recommend removal of drainage catheter as soon as possible (once output is scant for 48 hours). Electronically Signed   By: Wilkie Lent M.D.   On: 01/26/2024 12:55   CT Angio Abd/Pel w/ and/or w/o Result Date: 01/26/2024 CLINICAL DATA:  Splenic fluid collection. Possible infarct versus abscess. EXAM: CTA ABDOMEN AND PELVIS WITHOUT AND  WITH CONTRAST TECHNIQUE: Multidetector CT imaging of the abdomen and pelvis was performed using the standard protocol during bolus administration of intravenous contrast. Multiplanar reconstructed images and MIPs were obtained and reviewed to evaluate the vascular anatomy. RADIATION DOSE REDUCTION: This exam was performed according to the departmental dose-optimization program which includes automated exposure control, adjustment of the mA and/or kV according to patient size and/or use of iterative reconstruction technique. CONTRAST:  OMNIPAQUE  IOHEXOL  350 MG/ML SOLN COMPARISON:  CT scan without contrast performed yesterday 01/25/2024 FINDINGS: VASCULAR Aorta: Normal caliber aorta without aneurysm, dissection, vasculitis or significant stenosis. Scattered atherosclerotic vascular calcifications. 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 The left hepatic artery is replaced to the left gastric artery. No aneurysm or dissection. SMA: Patent without evidence of aneurysm, dissection, vasculitis or significant stenosis. Renals: Solitary renal arteries bilaterally. Calcified plaque results in high-grade stenosis of the left renal artery. The right renal artery is patent but relatively small in caliber. No aneurysm or changes of FMD. IMA: Patent without evidence of aneurysm, dissection, vasculitis or significant stenosis. Inflow: Patent without evidence of aneurysm, dissection, vasculitis or significant stenosis. Proximal Outflow: Bilateral common femoral and visualized portions of the superficial and profunda femoral arteries are patent without evidence of aneurysm, dissection, vasculitis or significant stenosis. Veins: Widely patent portal, splenic and visceral veins. Review of the MIP images confirms the above findings. NON-VASCULAR Lower chest: Dependent atelectasis bilaterally. Cardiomegaly. Calcified atherosclerotic plaque along the coronary  arteries. Thickening calcified aortic valve. Hepatobiliary: Normal hepatic contour and morphology. No discrete hepatic lesions. Normal appearance of the gallbladder. No intra or extrahepatic biliary ductal dilatation. Pancreas: Unremarkable. No pancreatic ductal dilatation or surrounding inflammatory changes. Spleen: Oblong rounded hypoechoic collection in the subcapsular aspect of the spleen extends from cranial to caudal and measures 12.0 x 4.2 x 4.4 cm. The collection is circumscribed. No evidence of peripheral enhancement or hyperemia. There is a second smaller wedge-shaped focus of ill-defined low attenuation in the anterior lower pole of the spleen. Overall, these findings suggest a combination of acute and subacute splenic infarcts. Adrenals/Urinary Tract: Normal adrenal glands. 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 Similar findings have been noted on multiple prior examinations. Stomach/Bowel: Stomach is within normal limits. Appendix appears  normal. No evidence of bowel wall thickening, distention, or inflammatory changes. Lymphatic: No suspicious lymphadenopathy. Reproductive: Prostatomegaly. Other: No abdominal wall hernia or abnormality. No abdominopelvic ascites. Musculoskeletal: No acute fracture or aggressive appearing lytic or blastic osseous lesion. Lower lumbar degenerative disc disease and facet arthropathy. IMPRESSION: 1. Oblong low-attenuation fluid collection in the subcapsular periphery of the spleen extending from cranial to caudal. There is a second smaller wedge-shaped and less well-defined region of decreased enhancement in the inferior and anterior aspect of the splenic tip. Overall findings are most suggestive of a combination of acute and subacute splenic infarct. No peripheral enhancement, hyperemia or internal gas to suggest the presence of infection. 2. 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 3.  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 4. Extensive cystic changes of both kidneys with cysts of variable density and complexity as seen on prior imaging. As before, outpatient MRI with gadolinium contrast could be considered to exclude neoplasm if clinically warranted. 5. Additional ancillary findings as above. Electronically Signed   By: Wilkie Lent M.D.   On: 01/26/2024 08:46     Assessment/Plan: 84 y.o. 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   Enterococcus bacteremia -with sepsis  source could be AV FISTULA VS GUS Pt was on ceftriaxone  and vanco- changed to ampicillin  adjusted to Dialysis. 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

## 2024-01-27 NOTE — Procedures (Signed)
 Central Venous Catheter Placement:  TRIPLE LUMEN     Procedure: Insertion of Non-tunneled Central Venous [phone]) with US  guidance (23062)    Indication(s) Medication administration and Difficult access  CVP monitoring Patient receiving vesicant or irritant drug.; Patient receiving intravenous therapy for longer than 5 days.; Patient has limited or no vascular access.      Consent Risks of the procedure as well as the alternatives and risks of each were explained to the patient and/or caregiver.  Consent for the procedure was obtained and is signed in the bedside chart   Consent-verbal/written         Timeout Verified patient identification, verified procedure, site/side was marked, verified correct patient position, special equipment/implants available, medications/allergies/relevant history reviewed, required imaging and test results available. Patient comfort was obtained.     Sterile Technique Maximal sterile technique including full sterile barrier drape, hand hygiene, sterile gown, sterile gloves, mask, hair covering, sterile ultrasound probe cover (if used).   Hand washing performed prior to starting the procedure.      Procedure Description Area of catheter insertion was cleaned with chlorhexidine  and draped in sterile fashion.  With real-time ultrasound guidance a central venous catheter was placed into the right internal jugular vein. Nonpulsatile blood flow and easy flushing noted in all ports.  The catheter was sutured in place and sterile dressing applied.   A triple lumen catheter was placed in left Vein There was good blood return, catheter caps were placed on lumens, catheter flushed easily, the line was secured and a sterile dressing  applied.      Complications/Tolerance None; patient tolerated the procedure well. Chest X-ray is ordered to verify placement     EBL Minimal   Specimen(s) None     Number of Attempts: 1 Complications:none Estimated  Blood Loss: none    Nettye Flegal, M.D.  Pulmonary & Critical Care Medicine  [facility]

## 2024-01-27 NOTE — Progress Notes (Signed)
 Pt BP hypotensive despite max dose on levo gtt. Inge, NP aware. Orders to titrate above parameters to maintain MAP until central line can be placed.

## 2024-01-27 NOTE — Progress Notes (Signed)
 STROKE TEAM PROGRESS NOTE   SUBJECTIVE (INTERVAL HISTORY) His daughter is at the bedside.  Overall his condition is stable. Pending TEE today. Also pending MRA and CUS today.    OBJECTIVE Temp:  [97.4 F (36.3 C)-98.5 F (36.9 C)] 97.7 F (36.5 C) (01/15 1100) Pulse Rate:  [110-117] 111 (01/15 0730) Cardiac Rhythm: Atrial fibrillation (01/15 0800) Resp:  [14-28] 15 (01/15 0730) BP: (65-122)/(35-76) 107/41 (01/15 0730) SpO2:  [87 %-99 %] 97 % (01/15 0730) Weight:  [101 kg-101.6 kg] 101 kg (01/14 1935)  Recent Labs  Lab 01/26/24 1321 01/26/24 1633 01/26/24 2103 01/27/24 0728 01/27/24 1122  GLUCAP 115* 130* 132* 122* 110*   Recent Labs  Lab 01/24/24 0317 01/24/24 1554 01/25/24 0330 01/26/24 0327 01/27/24 0505  NA 135 132* 130* 128* 132*  K 3.7 3.6 3.3* 3.5 3.8  CL 97* 94* 93* 93* 94*  CO2 25 27 27 26 27   GLUCOSE 120* 173* 147* 137* 135*  BUN 52* 31* 35* 39* 26*  CREATININE 5.05* 3.37* 3.62* 3.77* 2.97*  CALCIUM  8.5* 8.3* 8.3* 8.3* 8.8*  MG 2.0  --  1.9 1.8 1.9  PHOS 3.9 2.5 2.7 3.2 3.1   Recent Labs  Lab 01/22/24 1745 01/24/24 0317 01/24/24 1554 01/25/24 0330 01/27/24 0505  AST 73* 34  --  22  --   ALT 70* 48*  --  33  --   ALKPHOS 86 66  --  64  --   BILITOT 0.5 0.9  --  0.7  --   PROT 7.6 6.7  --  6.5  --   ALBUMIN  3.1* 2.7* 2.7* 2.6* 2.6*   Recent Labs  Lab 01/24/24 0317 01/24/24 1005 01/25/24 0330 01/25/24 1309 01/26/24 0327 01/27/24 0505  WBC 8.9  --  9.5 11.3* 10.5 8.5  NEUTROABS  --   --   --  8.8*  --   --   HGB 8.2* 9.1* 8.7* 9.1* 8.6* 8.9*  HCT 25.0* 27.7* 28.4* 28.6* 27.1* 28.5*  MCV 76.7*  --  79.1* 77.3* 78.1* 78.9*  PLT 202  --  185 202 193 227   No results for input(s): CKTOTAL, CKMB, CKMBINDEX, TROPONINI in the last 168 hours. Recent Labs    01/26/24 0949  LABPROT 19.8*  INR 1.6*   No results for input(s): COLORURINE, LABSPEC, PHURINE, GLUCOSEU, HGBUR, BILIRUBINUR, KETONESUR, PROTEINUR,  UROBILINOGEN, NITRITE, LEUKOCYTESUR in the last 72 hours.  Invalid input(s): APPERANCEUR     Component Value Date/Time   CHOL 82 01/27/2024 0505   TRIG 79 01/27/2024 0505   HDL 23 (L) 01/27/2024 0505   CHOLHDL 3.5 01/27/2024 0505   VLDL 16 01/27/2024 0505   LDLCALC 42 01/27/2024 0505   Lab Results  Component Value Date   HGBA1C 5.9 (H) 01/23/2024   No results found for: LABOPIA, COCAINSCRNUR, LABBENZ, AMPHETMU, THCU, LABBARB  No results for input(s): ETH in the last 168 hours.  I have personally reviewed the radiological images below and agree with the radiology interpretations.  CT GUIDED PERITONEAL/RETROPERITONEAL FLUID DRAIN BY PERC CATH Result Date: 01/26/2024 INDICATION: 84 year old male with sepsis and new low-attenuation fluid collection versus liquified infarct in the spleen. He presents for CT-guided aspiration with possible drain placement. EXAM: CT-guided drain placement TECHNIQUE: Multidetector CT imaging of the abdomen was performed following the standard protocol without IV contrast. RADIATION DOSE REDUCTION: This exam was performed according to the departmental dose-optimization program which includes automated exposure control, adjustment of the mA and/or kV according to patient size and/or use of  iterative reconstruction technique. MEDICATIONS: The patient is currently admitted to the hospital and receiving intravenous antibiotics. The antibiotics were administered within an appropriate time frame prior to the initiation of the procedure. ANESTHESIA/SEDATION: Moderate (conscious) sedation was employed during this procedure. A total of Versed  0.5 mg and Fentanyl  25 mcg was administered intravenously by the radiology nurse. Total intra-service moderate Sedation Time: 10 minutes. The patient's level of consciousness and vital signs were monitored continuously by radiology nursing throughout the procedure under my direct supervision. COMPLICATIONS: None  immediate. PROCEDURE: Informed written consent was obtained from the patient after a thorough discussion of the procedural risks, benefits and alternatives. All questions were addressed. Maximal Sterile Barrier Technique was utilized including caps, mask, sterile gowns, sterile gloves, sterile drape, hand hygiene and skin antiseptic. A timeout was performed prior to the initiation of the procedure. A planning axial CT scan was performed. The low-attenuation region in the spleen was identified. A skin entry site was selected and marked. The skin was sterilely prepped and draped in the standard fashion using chlorhexidine  skin prep. Local anesthesia was attained by infiltration with 1% lidocaine . A small dermatotomy was made. Under intermittent CT guidance, an 18 gauge trocar needle was advanced into the fluid collection. Aspiration yields foul-smelling thin reddish brown fluid. It is unclear if this represents liquified hematoma or simple abscess. Regardless, the foul small suggests infection and therefore drain placement will be performed. A 0.035 wire was coiled in the collection. The trocar needle was removed. The percutaneous tract was dilated to 12 French. A 12 French all-purpose drainage catheter was advanced over the wire and formed. Aspiration yields 90 mL of the brownish red foul-smelling fluid. Samples were sent for Gram stain and culture. Follow-up CT imaging demonstrates a well-positioned drainage catheter with no evidence of hemorrhage. The pleura remains cephalad to the drain. No evidence of transgression. IMPRESSION: Successful placement of a 12 French drainage catheter into the splenic fluid collection yielding 90 mL of reddish brown foul-smelling fluid. Samples were sent for Gram stain and culture. PLAN: Maintain drain to JP bulb suction. Do not flush. Recommend removal of drainage catheter as soon as possible (once output is scant for 48 hours). Electronically Signed   By: Wilkie Lent M.D.    On: 01/26/2024 12:55   CT Angio Abd/Pel w/ and/or w/o Result Date: 01/26/2024 CLINICAL DATA:  Splenic fluid collection. Possible infarct versus abscess. EXAM: CTA ABDOMEN AND PELVIS WITHOUT AND WITH CONTRAST TECHNIQUE: Multidetector CT imaging of the abdomen and pelvis was performed using the standard protocol during bolus administration of intravenous contrast. Multiplanar reconstructed images and MIPs were obtained and reviewed to evaluate the vascular anatomy. RADIATION DOSE REDUCTION: This exam was performed according to the departmental dose-optimization program which includes automated exposure control, adjustment of the mA and/or kV according to patient size and/or use of iterative reconstruction technique. CONTRAST:  OMNIPAQUE  IOHEXOL  350 MG/ML SOLN COMPARISON:  CT scan without contrast performed yesterday 01/25/2024 FINDINGS: VASCULAR Aorta: Normal caliber aorta without aneurysm, dissection, vasculitis or significant stenosis. Scattered atherosclerotic vascular calcifications. 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 The left hepatic artery is replaced to the left gastric artery. No aneurysm or dissection. SMA: Patent without evidence of aneurysm, dissection, vasculitis or significant stenosis. Renals: Solitary renal arteries bilaterally. Calcified plaque results in high-grade stenosis of the left renal artery. The right renal artery is patent but relatively small in caliber. No aneurysm or changes of FMD. IMA:  Patent without evidence of aneurysm, dissection, vasculitis or significant stenosis. Inflow: Patent without evidence of aneurysm, dissection, vasculitis or significant stenosis. Proximal Outflow: Bilateral common femoral and visualized portions of the superficial and profunda femoral arteries are patent without evidence of aneurysm, dissection, vasculitis or significant stenosis. Veins: Widely patent portal, splenic and  visceral veins. Review of the MIP images confirms the above findings. NON-VASCULAR Lower chest: Dependent atelectasis bilaterally. Cardiomegaly. Calcified atherosclerotic plaque along the coronary arteries. Thickening calcified aortic valve. Hepatobiliary: Normal hepatic contour and morphology. No discrete hepatic lesions. Normal appearance of the gallbladder. No intra or extrahepatic biliary ductal dilatation. Pancreas: Unremarkable. No pancreatic ductal dilatation or surrounding inflammatory changes. Spleen: Oblong rounded hypoechoic collection in the subcapsular aspect of the spleen extends from cranial to caudal and measures 12.0 x 4.2 x 4.4 cm. The collection is circumscribed. No evidence of peripheral enhancement or hyperemia. There is a second smaller wedge-shaped focus of ill-defined low attenuation in the anterior lower pole of the spleen. Overall, these findings suggest a combination of acute and subacute splenic infarcts. Adrenals/Urinary Tract: Normal adrenal glands. 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 Similar findings have been noted on multiple prior examinations. Stomach/Bowel: Stomach is within normal limits. Appendix appears normal. No evidence of bowel wall thickening, distention, or inflammatory changes. Lymphatic: No suspicious lymphadenopathy. Reproductive: Prostatomegaly. Other: No abdominal wall hernia or abnormality. No abdominopelvic ascites. Musculoskeletal: No acute fracture or aggressive appearing lytic or blastic osseous lesion. Lower lumbar degenerative disc disease and facet arthropathy. IMPRESSION: 1. Oblong low-attenuation fluid collection in the subcapsular periphery of the spleen extending from cranial to caudal. There is a second smaller wedge-shaped and less well-defined region of decreased enhancement in the inferior and anterior aspect of the splenic tip. Overall findings are most suggestive of a combination of acute and subacute splenic  infarct. No peripheral enhancement, hyperemia or internal gas to suggest the presence of infection. 2. 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 3. 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 4. Extensive cystic changes of both kidneys with cysts of variable density and complexity as seen on prior imaging. As before, outpatient MRI with gadolinium contrast could be considered to exclude neoplasm if clinically warranted. 5. Additional ancillary findings as above. Electronically Signed   By: Wilkie Lent M.D.   On: 01/26/2024 08:46   MR BRAIN WO CONTRAST Result Date: 01/25/2024 EXAM: MRI BRAIN WITHOUT CONTRAST 01/25/2024 02:21:13 PM TECHNIQUE: Multiplanar multisequence MRI of the head/brain was performed without the administration of intravenous contrast. COMPARISON: None available. CLINICAL HISTORY: Mental status change, unknown cause. The patient presents with a mental status change of unknown cause. 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 T2 hyperintensities in the cerebral white matter bilaterally are nonspecific but compatible with mild chronic small vessel ischemic disease. There is mild to moderate cerebral atrophy. No intracranial hemorrhage, mass, midline shift, hydrocephalus, or extra-axial fluid collection is identified. Major intracranial vascular flow voids are preserved. ORBITS: No significant abnormality. SINUSES AND MASTOIDS: No significant abnormality. BONES AND SOFT TISSUES: Normal marrow signal. No significant soft tissue abnormality. IMPRESSION: 1. 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 2. Mild chronic small vessel ischemic disease. Electronically signed by: Dasie Hamburg MD 01/25/2024 04:40 PM EST RP Workstation: [id number]   ECHOCARDIOGRAM COMPLETE Result Date: 01/25/2024    ECHOCARDIOGRAM REPORT   Patient Name:   Nixon L Goynes Date  of Exam:  01/25/2024 Medical Rec #:  [id number]     Height:       77.0 in Accession #:    [id number]    Weight:       219.1 lb Date of Birth:  Apr 24, 1940     BSA:          2.325 m Patient Age:    83 years      BP:           111/47 mmHg Patient Gender: M             HR:           113 bpm. Exam Location:  [facility] Procedure: 2D Echo, Cardiac Doppler, Color Doppler, 3D Echo and Strain Analysis            (Both Spectral and Color Flow Doppler were utilized during            procedure). Indications:     Shock R57.9  History:         Patient has prior history of Echocardiogram examinations, most                  recent 07/18/2023. Risk Factors:Diabetes and Hypertension.  Sonographer:     Christopher Furnace Referring Phys:  [id number] LONELL KANDICE MOOSE Diagnosing Phys: Cara JONETTA Lovelace MD  Sonographer Comments: Global longitudinal strain was attempted. IMPRESSIONS  1. Left ventricular ejection fraction, by estimation, is 45 to 50%. The left ventricle has mildly decreased function. The left ventricle demonstrates global hypokinesis. The left ventricular internal cavity size was mildly dilated. There is mild asymmetric left ventricular hypertrophy. Left ventricular diastolic parameters are consistent with Grade II diastolic dysfunction (pseudonormalization). The average left ventricular global longitudinal strain is 6.7 %. The global longitudinal strain is abnormal.  2. Right ventricular systolic function is low normal. The right ventricular size is mildly enlarged.  3. Left atrial size was mildly dilated.  4. The mitral valve is grossly normal. Mild to moderate mitral valve regurgitation.  5. Tricuspid valve regurgitation is mild to moderate.  6. The aortic valve is calcified. Aortic valve regurgitation is mild. Mild aortic valve stenosis. FINDINGS  Left Ventricle: Left ventricular ejection fraction, by estimation, is 45 to 50%. The left ventricle has mildly decreased function. The left ventricle demonstrates global hypokinesis. The average left  ventricular global longitudinal strain is 6.7 %. Strain was performed and the global longitudinal strain is abnormal. The left ventricular internal cavity size was mildly dilated. There is mild asymmetric left ventricular hypertrophy. Left ventricular diastolic parameters are consistent with Grade II diastolic dysfunction (pseudonormalization). Right Ventricle: The right ventricular size is mildly enlarged. No increase in right ventricular wall thickness. Right ventricular systolic function is low normal. Left Atrium: Left atrial size was mildly dilated. Right Atrium: Right atrial size was normal in size. Pericardium: There is no evidence of pericardial effusion. Mitral Valve: The mitral valve is grossly normal. Mild to moderate mitral valve regurgitation. MV peak gradient, 12.4 mmHg. The mean mitral valve gradient is 6.0 mmHg. Tricuspid Valve: The tricuspid valve is normal in structure. Tricuspid valve regurgitation is mild to moderate. Aortic Valve: The aortic valve is calcified. Aortic valve regurgitation is mild. Aortic regurgitation PHT measures 206 msec. Mild aortic stenosis is present. Aortic valve mean gradient measures 16.2 mmHg. Aortic valve peak gradient measures 30.7 mmHg. Aortic valve area, by VTI measures 1.51 cm. Pulmonic Valve: The pulmonic valve was not well visualized. Pulmonic valve regurgitation is not visualized. Aorta:  The aortic root was not well visualized. IAS/Shunts: No atrial level shunt detected by color flow Doppler. Additional Comments: 3D was performed not requiring image post processing on an independent workstation and was abnormal.  LEFT VENTRICLE PLAX 2D LVIDd:         5.20 cm      Diastology LVIDs:         3.90 cm      LV e' medial:    3.59 cm/s LV PW:         1.10 cm      LV E/e' medial:  39.0 LV IVS:        1.40 cm      LV e' lateral:   8.27 cm/s LVOT diam:     2.00 cm      LV E/e' lateral: 16.9 LV SV:         62 LV SV Index:   26           2D Longitudinal Strain LVOT Area:      3.14 cm     2D Strain GLS (A4C):   5.9 % LV IVRT:       48 msec      2D Strain GLS (A3C):   6.1 %                             2D Strain GLS (A2C):   8.2 %                             2D Strain GLS Avg:     6.7 % LV Volumes (MOD) LV vol d, MOD A2C: 147.0 ml LV vol d, MOD A4C: 112.0 ml LV vol s, MOD A2C: 83.5 ml LV vol s, MOD A4C: 72.4 ml LV SV MOD A2C:     63.5 ml LV SV MOD A4C:     112.0 ml LV SV MOD BP:      47.7 ml RIGHT VENTRICLE RV Basal diam:  4.00 cm RV Mid diam:    3.50 cm RV S prime:     11.90 cm/s TAPSE (M-mode): 1.8 cm LEFT ATRIUM             Index        RIGHT ATRIUM           Index LA diam:        4.00 cm 1.72 cm/m   RA Area:     15.20 cm LA Vol (A2C):   97.8 ml 42.06 ml/m  RA Volume:   31.10 ml  13.37 ml/m LA Vol (A4C):   91.0 ml 39.13 ml/m LA Biplane Vol: 96.0 ml 41.28 ml/m  AORTIC VALVE AV Area (Vmax):    1.27 cm AV Area (Vmean):   1.28 cm AV Area (VTI):     1.51 cm AV Vmax:           277.25 cm/s AV Vmean:          183.000 cm/s AV VTI:            0.409 m AV Peak Grad:      30.7 mmHg AV Mean Grad:      16.2 mmHg LVOT Vmax:         112.00 cm/s LVOT Vmean:        74.500 cm/s LVOT VTI:          0.196 m LVOT/AV  VTI ratio: 0.48 AI PHT:            206 msec  AORTA Ao Root diam: 2.70 cm MITRAL VALVE                  TRICUSPID VALVE MV Area (PHT): 5.31 cm       TR Peak grad:   26.4 mmHg MV Area VTI:   2.15 cm       TR Vmax:        257.00 cm/s MV Peak grad:  12.4 mmHg MV Mean grad:  6.0 mmHg       SHUNTS MV Vmax:       1.76 m/s       Systemic VTI:  0.20 m MV Vmean:      119.0 cm/s     Systemic Diam: 2.00 cm MV Decel Time: 143 msec MR Peak grad:    108.6 mmHg MR Mean grad:    74.0 mmHg MR Vmax:         521.00 cm/s MR Vmean:        418.0 cm/s MR PISA:         4.02 cm MR PISA Eff ROA: 25 mm MR PISA Radius:  0.80 cm MV E velocity: 140.00 cm/s MV A velocity: 114.00 cm/s MV E/A ratio:  1.23 Dwayne D Callwood MD Electronically signed by Cara JONETTA Lovelace MD Signature Date/Time: 01/25/2024/2:49:15 PM     Final    CT ABDOMEN PELVIS WO CONTRAST Result Date: 01/25/2024 EXAM: CT ABDOMEN AND PELVIS WITHOUT CONTRAST 01/25/2024 11:06:03 AM TECHNIQUE: CT of the abdomen and pelvis was performed without the administration of intravenous contrast. Multiplanar reformatted images are provided for review. Automated exposure control, iterative reconstruction, and/or weight-based adjustment of the mA/kV was utilized to reduce the radiation dose to as low as reasonably achievable. COMPARISON: 09/07/2023 CLINICAL HISTORY: Sepsis. FINDINGS: LOWER CHEST: Coronary and aortic atherosclerosis with moderate cardiomegaly. Trace bilateral pleural effusions with mild atelectasis in the lung bases. Hazy airspace opacity medially in the left lower lobe on image 9 series 4 is nonspecific, early bronchopneumonia not excluded. LIVER: The liver is unremarkable. GALLBLADDER AND BILE DUCTS: Contracted gallbladder. No biliary ductal dilatation. SPLEEN: Abnormal hypodense lesions in the spleen measuring up to 10.5 x 4.5 x 4.0 cm (volume = 99 cm\S\3), not present on 09/07/2023. Splenic abscess is not excluded. PANCREAS: No acute abnormality. ADRENAL GLANDS: No acute abnormality. KIDNEYS, URETERS AND BLADDER: Scattered hypodense and hyperdense renal lesions similar to previous, suspicious for polycystic kidney disease. Enhancement not assessed on today's noncontrast examination. Overall morphology similar to the 09/07/2023 exam. No stones in the kidneys or ureters. No hydronephrosis. No perinephric or periureteral stranding. Urinary bladder is unremarkable. GI AND BOWEL: Stomach demonstrates no acute abnormality. Stable dilated appendix at 9 mm diameter on image 73 series 2 with internal high density suspicious for appendicolith, similar appearance on 09/07/2023. Given the chronic prominence, an underlying appendiceal mass cannot be readily excluded. No current periappendiceal inflammatory findings. There is no bowel obstruction. PERITONEUM AND  RETROPERITONEUM: No ascites. No free air. New nonspecific presacral edema. VASCULATURE: Aorta is normal in caliber. Atherosclerosis atheromatous plaque at the origin of the superior mesenteric artery. LYMPH NODES: No lymphadenopathy. REPRODUCTIVE ORGANS: Stable prostatomegaly. BONES AND SOFT TISSUES: Low level subcutaneous edema along the flanks. Bridging spurring of both sacroiliac joints. 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 Grade 1 degenerative anterolisthesis at L4-L5 with lower lumbar spondylosis and degenerative disc disease and suspected bilateral foraminal stenosis at L3-L4  and L4-L5 with borderline bilateral foraminal stenosis at L5-S1. A small umbilical hernia contains adipose tissue. IMPRESSION: 1. Abnormal hypodense lesions in the spleen measuring up to 10.5 x 4.5 x 4.0 cm (volume = 99 cm\S\3), not present on prior exam, raising concern for splenic abscesses or hematomas. 2. Stable dilated appendix at 9 mm diameter with internal high density suspicious for appendicolith. Given the chronic prominence, an underlying appendiceal mass cannot be readily excluded. 3. Hazy airspace opacity medially in the left lower lobe, nonspecific, with early bronchopneumonia not excluded. 4. Scattered hypodense and hyperdense renal lesions are suspicious for polycystic kidney disease. 5. New nonspecific presacral edema. 6. 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 7. Grade 1 degenerative anterolisthesis at L4-5 with lower lumbar spondylosis and degenerative disc disease and suspected bilateral foraminal stenosis at L3-4 and L4-5 with borderline bilateral foraminal stenosis at L5-S1. 8. Coronary and aortic atherosclerosis with moderate cardiomegaly. 9. Trace bilateral pleural effusions with mild atelectasis in the lung bases. 10. Stable prostatomegaly. Impression #1 will be telephoned to the referring provider or the referring provider's representative by professional  radiology assistant [facility]) personnel, with communication documented in the clario dashboard. Electronically signed by: Ryan Salvage MD MD 01/25/2024 11:37 AM EST RP Workstation: [id number]   DG Chest 1 View Result Date: 01/23/2024 EXAM: 1 VIEW(S) XRAY OF THE CHEST 01/23/2024 10:37:00 PM COMPARISON: 01/22/2024 CLINICAL HISTORY: Lethargy FINDINGS: LINES, TUBES AND DEVICES: Left axillary stent noted. LUNGS AND PLEURA: Increased interstitial markings, lower lung/infrahilar predominant, favoring mild interstitial edema although multifocal infection/pneumonia is also possible. No pleural effusion. No pneumothorax. HEART AND MEDIASTINUM: Stable cardiomegaly. Aortic atherosclerosis. BONES AND SOFT TISSUES: No acute osseous abnormality. IMPRESSION: 1. Increased lower lung/infrahilar predominant interstitial markings, favoring mild interstitial edema over multifocal infection/pneumonia. Electronically signed by: Pinkie Pebbles MD MD 01/23/2024 10:41 PM EST RP Workstation: [id number]   CT Chest Wo Contrast Result Date: 01/22/2024 CLINICAL DATA:  Respiratory illness, nondiagnostic xray, chest pressure, short of breath EXAM: CT CHEST WITHOUT CONTRAST TECHNIQUE: Multidetector CT imaging of the chest was performed following the standard protocol without IV contrast. RADIATION DOSE REDUCTION: This exam was performed according to the departmental dose-optimization program which includes automated exposure control, adjustment of the mA and/or kV according to patient size and/or use of iterative reconstruction technique. COMPARISON:  01/22/2024, 09/07/2023 FINDINGS: Cardiovascular: Unenhanced imaging of the heart demonstrates cardiomegaly without pericardial effusion. Normal caliber of the thoracic aorta. Atherosclerosis of the aorta and coronary vasculature. Assessment of the vascular lumen cannot be performed without intravenous contrast. Mediastinum/Nodes: No enlarged mediastinal or axillary lymph nodes. Thyroid gland,  trachea, and esophagus demonstrate no significant findings. Lungs/Pleura: No acute airspace disease, effusion, or pneumothorax. Dependent hypoventilatory changes are noted. Central airways are patent. Upper Abdomen: Since the prior abdominal CT 09/07/2023, there has been development of borderline splenomegaly measuring 12.7 x 11.9 x 6.8 cm. Within the superolateral aspect of the spleen there is a new indeterminate 6.1 x 6.8 x 4.6 cm hypodensity. Musculoskeletal: No acute or destructive bony abnormalities. Reconstructed images demonstrate no additional findings. IMPRESSION: 1. Cardiomegaly.  No pericardial effusion. 2. No acute airspace disease. 3. Borderline splenomegaly and indeterminate 6.8 cm splenic hypodensity, which have developed in the interim since the 09/07/2023 CT. This is incompletely characterized on this exam, and if further evaluation is clinically indicated a contrasted CT or MRI of the abdomen could be considered. 4. Aortic Atherosclerosis (ICD10-I70.0). Coronary artery atherosclerosis. Electronically Signed   By: Ozell Daring M.D.   On: 01/22/2024 20:00   DG  Chest 2 View Result Date: 01/22/2024 CLINICAL DATA:  Chest pain and shortness of breath. EXAM: CHEST - 2 VIEW COMPARISON:  07/19/2023. FINDINGS: The heart is enlarged and the mediastinal contour is within normal limits. Atherosclerotic calcification of the aorta is noted. Mild atelectasis or scarring is present at the left lung base. No consolidation, effusion, or pneumothorax is seen. No acute osseous abnormality. A vascular stent is noted in the axillary region on the left. IMPRESSION: No active cardiopulmonary disease. Electronically Signed   By: Leita Birmingham M.D.   On: 01/22/2024 18:17   VAS US  DUPLEX DIALYSIS ACCESS (AVF, AVG) Result Date: 01/19/2024 DIALYSIS ACCESS Patient Name:  TYREESE THAIN  Date of Exam:   01/18/2024 Medical Rec #: [id number]      Accession #:    [id number] Date of Birth: Sep 05, 1940      Patient Gender: M Patient  Age:   26 years Exam Location:  [facility] Procedure:      VAS US  DUPLEX DIALYSIS ACCESS (AVF, AVG) Referring Phys: SELINDA GU --------------------------------------------------------------------------------  Reason for Exam: Routine follow up. Access Site: Left Upper Extremity. Access Type: Brachial-cephalic AVF. History: 2023: Left brachial-cephalic AVF created;          06/08/23: Stent placed at cephalic / subclavian confluence;          09/2023: mid upper arm PTA and stent;. Performing Technologist: Elsie Churn RT, RDMS, RVT  Examination Guidelines: A complete evaluation includes B-mode imaging, spectral Doppler, color Doppler, and power Doppler as needed of all accessible portions of each vessel. Unilateral testing is considered an integral part of a complete examination. Limited examinations for reoccurring indications may be performed as noted.  Findings: +--------------------+----------+-----------------+--------+ AVF                 PSV (cm/s)Flow Vol (mL/min)Comments +--------------------+----------+-----------------+--------+ Native artery inflow   196          1496                +--------------------+----------+-----------------+--------+ AVF Anastomosis        418                              +--------------------+----------+-----------------+--------+  +---------------+----------+-------------+----------+-----------------------+ OUTFLOW VEIN   PSV (cm/s)Diameter (cm)Depth (cm)       Describe         +---------------+----------+-------------+----------+-----------------------+ Subclavian vein   194                                                   +---------------+----------+-------------+----------+-----------------------+ Confluence        129                                    stent          +---------------+----------+-------------+----------+-----------------------+ Clavicle          139                                    stent           +---------------+----------+-------------+----------+-----------------------+ Shoulder          152  stent          +---------------+----------+-------------+----------+-----------------------+ Prox UA           133                                    stent          +---------------+----------+-------------+----------+-----------------------+ Mid UA            150                                    stent          +---------------+----------+-------------+----------+-----------------------+ Dist UA           325                           possible retained valve +---------------+----------+-------------+----------+-----------------------+ AC Fossa          119                                                   +---------------+----------+-------------+----------+-----------------------+  Summary: Patent left brachial-cephalic AVF with no focal hemodynamically significant velocity increases or internal vessel narrowing. Normal Flow Volume noted.  *See table(s) above for measurements and observations.  Diagnosing physician: Selinda Gu MD Electronically signed by Selinda Gu MD on 01/19/2024 at 7:24:41 AM.  --------------------------------------------------------------------------------   Final      PHYSICAL EXAM  Temp:  [97.4 F (36.3 C)-98.5 F (36.9 C)] 97.7 F (36.5 C) (01/15 1100) Pulse Rate:  [110-117] 111 (01/15 0730) Resp:  [14-28] 15 (01/15 0730) BP: (65-122)/(35-76) 107/41 (01/15 0730) SpO2:  [87 %-99 %] 97 % (01/15 0730) Weight:  [101 kg-101.6 kg] 101 kg (01/14 1935)  General - well nourished, well developed, in no apparent distress.     Ophthalmologic - fundi not visualized due to noncooperation.     Cardiovascular - irregularly irregular heart rate and rhythm.   Neuro - awake, alert, eyes open, orientated to age, place, time and people and situation. No aphasia, paucity of speech, naming 3/4 and able to repeat, following all  simple commands. No gaze palsy, tracking bilaterally, visual field full, PERRL. No facial droop. Tongue midline. Bilateral UEs 5/5, no drift but with mild asterixis. Bilaterally LEs 4/5, no drift but with mild asterixis. Sensation symmetrical bilaterally, b/l FTN intact, gait not tested.   Assessment: 84 y.o. male with PMH of ESRD on hemodialysis, diabetes, hypertension, hyperlipidemia, PAD, A-fib on Eliquis , BPH and dementia admitted for sepsis, septic shock, splenic abscess and separate splenic infarction with Enterococcus faecalis bacteremia.  Currently on ampicillin  treatment.  MRI was done for regulation of encephalopathy found to have left temporal occipital small infarct.  Neurology consulted.   Etiology for patient's stroke not quite clear, TEE pending to evaluate endocarditis.  However patient has single small stroke, not consistent with endocarditis and related stroke.  Patient does have A-fib but was on home Eliquis .  DDx including stroke in the setting of septic shock.  Will continue further stroke workup including MRI to rule out mycotic aneurysm, carotid Doppler.  If mycotic aneurysm is ruled out, may consider starting heparin  IV.  Continue current management including amlodipine  infusion, Aricept , midodrine   with Levophed .   Plan: - TEE pending to evaluate endocarditis - MRA and carotid Doppler pending - Once mycotic aneurysm ruled out, may consider starting heparin  IV and then transition back to eliquis  later - Continue home Aricept  for dementia - Continue midodrine , wean off Levophed  as able - Will follow  Hospital day # 4    Ary Cummins, MD PhD Stroke Neurology 01/27/2024 2:46 PM    To contact Stroke Continuity provider, please refer to [email or web address]. After hours, contact General Neurology

## 2024-01-27 NOTE — Progress Notes (Signed)
 [facility] SURGICAL PROGRESS NOTE (cpt [phone])  Hospital Day(s): 4.   Interval History: Patient seen and examined, no acute events or new complaints overnight. He is on 9 mcg Norepinephrine  this AM. Patient reports he is feeling better this morning. He remains without overt abdominal tenderness. No fever, nausea, emesis. WBC remains normal in last 24 hours; WBC 8.5K this AM. Hgb to 8.9; stable. Renal panel consistent with ESRD. He did get drain placed into splenic collection yesterday (01/14) with IR. Cx from this with [facility]. Output 150 ccs; serosanguinous this AM. He is on Ampicillin . Plan for TEE today.   Review of Systems:  Constitutional: denies fever, chills  HEENT: denies cough or congestion  Respiratory: denies any shortness of breath  Cardiovascular: denies chest pain or palpitations  Gastrointestinal: denies abdominal pain, N/V Genitourinary: denies burning with urination or urinary frequency Musculoskeletal: denies pain, decreased motor or sensation  Vital signs in last 24 hours: [min-max] current  Temp:  [97.4 F (36.3 C)-98.5 F (36.9 C)] 98.5 F (36.9 C) (01/15 0730) Pulse Rate:  [104-117] 111 (01/15 0730) Resp:  [12-28] 15 (01/15 0730) BP: (65-123)/(35-76) 107/41 (01/15 0730) SpO2:  [87 %-100 %] 97 % (01/15 0730) Weight:  [101 kg-101.6 kg] 101 kg (01/14 1935)     Height: 6' 5 (195.6 cm) Weight: 101 kg BMI (Calculated): 26.4   Intake/Output last 2 shifts:  01/14 0701 - 01/15 0700 In: 1207.1 [I.V.:807.1; IV Piggyback:400] Out: 1650 [Urine:500; Drains:150]   Physical Exam:  Constitutional: alert, cooperative and no distress  HENT: normocephalic without obvious abnormality  Eyes: PERRL, EOM's grossly intact and symmetric  Respiratory: breathing non-labored at rest  Cardiovascular: tachycardic and sinus rhythm, PVCs Gastrointestinal: soft, non-tender, and non-distended, no rebound/guarding. McBurney's point negative. He is certainly not peritonitic. Now  with drain in LUQ; output serosanguinous   Labs:     Latest Ref Rng & Units 01/27/2024    5:05 AM 01/26/2024    3:27 AM 01/25/2024    1:09 PM  CBC  WBC 4.0 - 10.5 K/uL 8.5  10.5  11.3   Hemoglobin 13.0 - 17.0 g/dL 8.9  8.6  9.1   Hematocrit 39.0 - 52.0 % 28.5  27.1  28.6   Platelets 150 - 400 K/uL 227  193  202       Latest Ref Rng & Units 01/27/2024    5:05 AM 01/26/2024    3:27 AM 01/25/2024    3:30 AM  CMP  Glucose 70 - 99 mg/dL 864  862  852   BUN 8 - 23 mg/dL 26  39  35   Creatinine 0.61 - 1.24 mg/dL 7.02  6.22  6.37   Sodium 135 - 145 mmol/L 132  128  130   Potassium 3.5 - 5.1 mmol/L 3.8  3.5  3.3   Chloride 98 - 111 mmol/L 94  93  93   CO2 22 - 32 mmol/L 27  26  27    Calcium  8.9 - 10.3 mg/dL 8.8  8.3  8.3   Total Protein 6.5 - 8.1 g/dL   6.5   Total Bilirubin 0.0 - 1.2 mg/dL   0.7   Alkaline Phos 38 - 126 U/L   64   AST 15 - 41 U/L   22   ALT 0 - 44 U/L   33      Imaging studies: No new pertinent imaging studies   Assessment/Plan:  84 y.o. male with shock and bacteremia, source unclear, found to have splenic  fluid collection concerning for possible abscess, complicated by pertinent comorbidities including ESRD on hemodialysis (MWF), T2DM, HTN, PAD, chronic atrial fibrillation on anticoagulation, chronic anemia.               -Appreciate IR assistance with drainage of splenic collection; monitor and record output. Okay to remove when output slows.  - No need for surgical interventions              - NPO for planned TEE; Otherwise, no contraindication to diet from general surgery [perspective.  - Continue IV Abx; ID following - follow up new Cx from 01/14             - Monitor abdominal examination  - Monitor leukocytosis; normalized  - Further management per consulting and primary services  All of the above findings and recommendations were discussed with the patient, patient's family (daughter), and the medical team, and all of patient's and family's questions were  answered to their expressed satisfaction.  -- Arthea Platt, PA-C [facility] 01/27/2024, 9:16 AM M-F: 7am - 4pm

## 2024-01-27 NOTE — Progress Notes (Addendum)
 [facility] CLINIC CARDIOLOGY PROGRESS NOTE   Patient ID: Jeffery Joseph MRN: [id number] DOB/AGE: 1940-09-01 84 y.o.  Admit date: 01/22/2024 Referring Physician Dr. Madison Peaches Primary Physician Epifanio Alm SQUIBB, MD  Primary Cardiologist Dr. Florencio Reason for Consultation AoCHF  HPI: Jeffery Joseph is a 84 y.o. 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 Cardiology was consulted for further evaluation.   Interval History:  - Patient seen and examined this morning, resting comfortably in hospital bed. Daughter was here earlier but had to work so no longer at bedside. RN to obtain signatures for consent for TEE. Procedure discussed in detail with patient today. - Remains on levo.  - Feels his breathing is overall stable.  Denies any chest discomfort or palpitations. HR overall stable.  Review of systems complete and found to be negative unless listed above   Vitals:   01/27/24 0615 01/27/24 0700 01/27/24 0715 01/27/24 0730  BP: (!) 104/41 (!) 99/44 (!) 100/46 (!) 107/41  Pulse: (!) 111 (!) 111 (!) 110 (!) 111  Resp: 16 18 17 15   Temp:    98.5 F (36.9 C)  TempSrc:    Oral  SpO2: 97% 98% 96% 97%  Weight:      Height:         Intake/Output Summary (Last 24 hours) at 01/27/2024 9041 Last data filed at 01/27/2024 9271 Gross per 24 hour  Intake 1188.39 ml  Output 1650 ml  Net -461.61 ml     PHYSICAL EXAM General: Chronically ill-appearing male, well nourished, in no acute distress. HEENT: Normocephalic and atraumatic. Neck: No JVD.  Lungs: Normal respiratory effort on room air. Clear bilaterally to auscultation. No wheezes, crackles, rhonchi.  Heart: HRR, elevated rate. Normal S1 and S2 without gallops or murmurs. Radial & DP pulses 2+ bilaterally. Abdomen: Non-distended appearing.  Msk:  Normal strength and tone for age. Extremities: No clubbing, cyanosis or edema.   Neuro: Alert and oriented X 3. Psych: Mood appropriate, affect congruent.    LABS: Basic Metabolic Panel: Recent Labs    01/26/24 0327 01/27/24 0505  NA 128* 132*  K 3.5 3.8  CL 93* 94*  CO2 26 27  GLUCOSE 137* 135*  BUN 39* 26*  CREATININE 3.77* 2.97*  CALCIUM  8.3* 8.8*  MG 1.8 1.9  PHOS 3.2 3.1   Liver Function Tests: Recent Labs    01/25/24 0330 01/27/24 0505  AST 22  --   ALT 33  --   ALKPHOS 64  --   BILITOT 0.7  --   PROT 6.5  --   ALBUMIN  2.6* 2.6*   No results for input(s): LIPASE, AMYLASE in the last 72 hours. CBC: Recent Labs    01/25/24 1309 01/26/24 0327 01/27/24 0505  WBC 11.3* 10.5 8.5  NEUTROABS 8.8*  --   --   HGB 9.1* 8.6* 8.9*  HCT 28.6* 27.1* 28.5*  MCV 77.3* 78.1* 78.9*  PLT 202 193 227   Cardiac Enzymes: No results for input(s): CKTOTAL, CKMB, CKMBINDEX, TROPONINIHS in the last 72 hours. BNP: No results for input(s): BNP in the last 72 hours. D-Dimer: No results for input(s): DDIMER in the last 72 hours. Hemoglobin A1C: No results for input(s): HGBA1C in the last 72 hours.  Fasting Lipid Panel: Recent Labs    01/27/24 0505  CHOL 82  HDL 23*  LDLCALC 42  TRIG 79  CHOLHDL 3.5   Thyroid Function Tests: No results for input(s): TSH, T4TOTAL, T3FREE, THYROIDAB in the last 72 hours.  Invalid input(s): FREET3 Anemia Panel: No results for input(s): VITAMINB12, FOLATE, FERRITIN, TIBC, IRON, RETICCTPCT in the last 72 hours.   CT GUIDED PERITONEAL/RETROPERITONEAL FLUID DRAIN BY PERC CATH Result Date: 01/26/2024 INDICATION: 84 year old male with sepsis and new low-attenuation fluid collection versus liquified infarct in the spleen. He presents for CT-guided aspiration with possible drain placement. EXAM: CT-guided drain placement TECHNIQUE: Multidetector CT imaging of the abdomen was performed following the  standard protocol without IV contrast. RADIATION DOSE REDUCTION: This exam was performed according to the departmental dose-optimization program which includes automated exposure control, adjustment of the mA and/or kV according to patient size and/or use of iterative reconstruction technique. MEDICATIONS: The patient is currently admitted to the hospital and receiving intravenous antibiotics. The antibiotics were administered within an appropriate time frame prior to the initiation of the procedure. ANESTHESIA/SEDATION: Moderate (conscious) sedation was employed during this procedure. A total of Versed  0.5 mg and Fentanyl  25 mcg was administered intravenously by the radiology nurse. Total intra-service moderate Sedation Time: 10 minutes. The patient's level of consciousness and vital signs were monitored continuously by radiology nursing throughout the procedure under my direct supervision. COMPLICATIONS: None immediate. PROCEDURE: Informed written consent was obtained from the patient after a thorough discussion of the procedural risks, benefits and alternatives. All questions were addressed. Maximal Sterile Barrier Technique was utilized including caps, mask, sterile gowns, sterile gloves, sterile drape, hand hygiene and skin antiseptic. A timeout was performed prior to the initiation of the procedure. A planning axial CT scan was performed. The low-attenuation region in the spleen was identified. A skin entry site was selected and marked. The skin was sterilely prepped and draped in the standard fashion using chlorhexidine  skin prep. Local anesthesia was attained by infiltration with 1% lidocaine . A small dermatotomy was made. Under intermittent CT guidance, an 18 gauge trocar needle was advanced into the fluid collection. Aspiration yields foul-smelling thin reddish brown fluid. It is unclear if this represents liquified hematoma or simple abscess. Regardless, the foul small suggests infection and therefore  drain placement will be performed. A 0.035 wire was coiled in the collection. The trocar needle was removed. The percutaneous tract was dilated to 12 French. A 12 French all-purpose drainage catheter was advanced over the wire and formed. Aspiration yields 90 mL of the brownish red foul-smelling fluid. Samples were sent for Gram stain and culture. Follow-up CT imaging demonstrates a well-positioned drainage catheter with no evidence of hemorrhage. The pleura remains cephalad to the drain. No evidence of transgression. IMPRESSION: Successful placement of a 12 French drainage catheter into the splenic fluid collection yielding 90 mL of reddish brown foul-smelling fluid. Samples were sent for Gram stain and culture. PLAN: Maintain drain to JP bulb suction. Do not flush. Recommend removal of drainage catheter as soon as possible (once output is scant for 48 hours). Electronically Signed   By: Wilkie Lent M.D.   On: 01/26/2024 12:55   CT Angio Abd/Pel w/ and/or w/o Result Date: 01/26/2024 CLINICAL DATA:  Splenic fluid collection. Possible infarct versus abscess. EXAM: CTA ABDOMEN AND PELVIS WITHOUT AND WITH CONTRAST TECHNIQUE: Multidetector CT imaging of the abdomen and pelvis was performed using the standard protocol during bolus administration of intravenous contrast. Multiplanar reconstructed images and MIPs were obtained and reviewed to evaluate the vascular anatomy. RADIATION DOSE REDUCTION: This exam was performed according to  the departmental dose-optimization program which includes automated exposure control, adjustment of the mA and/or kV according to patient size and/or use of iterative reconstruction technique. CONTRAST:  OMNIPAQUE  IOHEXOL  350 MG/ML SOLN COMPARISON:  CT scan without contrast performed yesterday 01/25/2024 FINDINGS: VASCULAR Aorta: Normal caliber aorta without aneurysm, dissection, vasculitis or significant stenosis. Scattered atherosclerotic vascular calcifications. Celiac:  Calcified atherosclerotic plaque at the origin of the celiac axis results in mild to moderate stenosis. The left hepatic artery is replaced to the left gastric artery. No aneurysm or dissection. SMA: Patent without evidence of aneurysm, dissection, vasculitis or significant stenosis. Renals: Solitary renal arteries bilaterally. Calcified plaque results in high-grade stenosis of the left renal artery. The right renal artery is patent but relatively small in caliber. No aneurysm or changes of FMD. IMA: Patent without evidence of aneurysm, dissection, vasculitis or significant stenosis. Inflow: Patent without evidence of aneurysm, dissection, vasculitis or significant stenosis. Proximal Outflow: Bilateral common femoral and visualized portions of the superficial and profunda femoral arteries are patent without evidence of aneurysm, dissection, vasculitis or significant stenosis. Veins: Widely patent portal, splenic and visceral veins. Review of the MIP images confirms the above findings. NON-VASCULAR Lower chest: Dependent atelectasis bilaterally. Cardiomegaly. Calcified atherosclerotic plaque along the coronary arteries. Thickening calcified aortic valve. Hepatobiliary: Normal hepatic contour and morphology. No discrete hepatic lesions. Normal appearance of the gallbladder. No intra or extrahepatic biliary ductal dilatation. Pancreas: Unremarkable. No pancreatic ductal dilatation or surrounding inflammatory changes. Spleen: Oblong rounded hypoechoic collection in the subcapsular aspect of the spleen extends from cranial to caudal and measures 12.0 x 4.2 x 4.4 cm. The collection is circumscribed. No evidence of peripheral enhancement or hyperemia. There is a second smaller wedge-shaped focus of ill-defined low attenuation in the anterior lower pole of the spleen. Overall, these findings suggest a combination of acute and subacute splenic infarcts. Adrenals/Urinary Tract: Normal adrenal glands. Extensive bilateral renal  cystic disease with cysts of varying complexity. Similar findings have been noted on multiple prior examinations. Stomach/Bowel: Stomach is within normal limits. Appendix appears normal. No evidence of bowel wall thickening, distention, or inflammatory changes. Lymphatic: No suspicious lymphadenopathy. Reproductive: Prostatomegaly. Other: No abdominal wall hernia or abnormality. No abdominopelvic ascites. Musculoskeletal: No acute fracture or aggressive appearing lytic or blastic osseous lesion. Lower lumbar degenerative disc disease and facet arthropathy. IMPRESSION: 1. Oblong low-attenuation fluid collection in the subcapsular periphery of the spleen extending from cranial to caudal. There is a second smaller wedge-shaped and less well-defined region of decreased enhancement in the inferior and anterior aspect of the splenic tip. Overall findings are most suggestive of a combination of acute and subacute splenic infarct. No peripheral enhancement, hyperemia or internal gas to suggest the presence of infection. 2. 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 3. 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 4. Extensive cystic changes of both kidneys with cysts of variable density and complexity as seen on prior imaging. As before, outpatient MRI with gadolinium contrast could be considered to exclude neoplasm if clinically warranted. 5. Additional ancillary findings as above. Electronically Signed   By: Wilkie Lent M.D.   On: 01/26/2024 08:46   MR BRAIN WO CONTRAST Result Date: 01/25/2024 EXAM: MRI BRAIN WITHOUT CONTRAST 01/25/2024 02:21:13 PM TECHNIQUE: Multiplanar multisequence MRI of the head/brain was performed without the administration of intravenous contrast. COMPARISON: None available. CLINICAL HISTORY: Mental status change, unknown cause. The patient presents with a mental status change of unknown cause. FINDINGS: BRAIN  AND VENTRICLES: There is  a 5 mm acute infarct laterally at the left temporooccipital junction. T2 hyperintensities in the cerebral white matter bilaterally are nonspecific but compatible with mild chronic small vessel ischemic disease. There is mild to moderate cerebral atrophy. No intracranial hemorrhage, mass, midline shift, hydrocephalus, or extra-axial fluid collection is identified. Major intracranial vascular flow voids are preserved. ORBITS: No significant abnormality. SINUSES AND MASTOIDS: No significant abnormality. BONES AND SOFT TISSUES: Normal marrow signal. No significant soft tissue abnormality. IMPRESSION: 1. 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 2. Mild chronic small vessel ischemic disease. Electronically signed by: Dasie Hamburg MD 01/25/2024 04:40 PM EST RP Workstation: [id number]   ECHOCARDIOGRAM COMPLETE Result Date: 01/25/2024    ECHOCARDIOGRAM REPORT   Patient Name:   Jeffery Joseph Date of Exam: 01/25/2024 Medical Rec #:  [id number]     Height:       77.0 in Accession #:    [id number]    Weight:       219.1 lb Date of Birth:  06-16-1940     BSA:          2.325 m Patient Age:    83 years      BP:           111/47 mmHg Patient Gender: M             HR:           113 bpm. Exam Location:  [facility] Procedure: 2D Echo, Cardiac Doppler, Color Doppler, 3D Echo and Strain Analysis            (Both Spectral and Color Flow Doppler were utilized during            procedure). Indications:     Shock R57.9  History:         Patient has prior history of Echocardiogram examinations, most                  recent 07/18/2023. Risk Factors:Diabetes and Hypertension.  Sonographer:     Christopher Furnace Referring Phys:  [id number] LONELL KANDICE MOOSE Diagnosing Phys: Cara JONETTA Lovelace MD  Sonographer Comments: Global longitudinal strain was attempted. IMPRESSIONS  1. Left ventricular ejection fraction, by estimation, is 45 to 50%. The left ventricle has mildly decreased function. The left ventricle demonstrates global hypokinesis. The left  ventricular internal cavity size was mildly dilated. There is mild asymmetric left ventricular hypertrophy. Left ventricular diastolic parameters are consistent with Grade II diastolic dysfunction (pseudonormalization). The average left ventricular global longitudinal strain is 6.7 %. The global longitudinal strain is abnormal.  2. Right ventricular systolic function is low normal. The right ventricular size is mildly enlarged.  3. Left atrial size was mildly dilated.  4. The mitral valve is grossly normal. Mild to moderate mitral valve regurgitation.  5. Tricuspid valve regurgitation is mild to moderate.  6. The aortic valve is calcified. Aortic valve regurgitation is mild. Mild aortic valve stenosis. FINDINGS  Left Ventricle: Left ventricular ejection fraction, by estimation, is 45 to 50%. The left ventricle has mildly decreased function. The left ventricle demonstrates global hypokinesis. The average left ventricular global longitudinal strain is 6.7 %. Strain was performed and the global longitudinal strain is abnormal. The left ventricular internal cavity size was mildly dilated. There is mild asymmetric left ventricular hypertrophy. Left ventricular diastolic parameters are consistent with Grade II diastolic dysfunction (pseudonormalization). Right Ventricle: The right ventricular size is mildly enlarged. No increase in right ventricular wall thickness.  Right ventricular systolic function is low normal. Left Atrium: Left atrial size was mildly dilated. Right Atrium: Right atrial size was normal in size. Pericardium: There is no evidence of pericardial effusion. Mitral Valve: The mitral valve is grossly normal. Mild to moderate mitral valve regurgitation. MV peak gradient, 12.4 mmHg. The mean mitral valve gradient is 6.0 mmHg. Tricuspid Valve: The tricuspid valve is normal in structure. Tricuspid valve regurgitation is mild to moderate. Aortic Valve: The aortic valve is calcified. Aortic valve regurgitation is  mild. Aortic regurgitation PHT measures 206 msec. Mild aortic stenosis is present. Aortic valve mean gradient measures 16.2 mmHg. Aortic valve peak gradient measures 30.7 mmHg. Aortic valve area, by VTI measures 1.51 cm. Pulmonic Valve: The pulmonic valve was not well visualized. Pulmonic valve regurgitation is not visualized. Aorta: The aortic root was not well visualized. IAS/Shunts: No atrial level shunt detected by color flow Doppler. Additional Comments: 3D was performed not requiring image post processing on an independent workstation and was abnormal.  LEFT VENTRICLE PLAX 2D LVIDd:         5.20 cm      Diastology LVIDs:         3.90 cm      LV e' medial:    3.59 cm/s LV PW:         1.10 cm      LV E/e' medial:  39.0 LV IVS:        1.40 cm      LV e' lateral:   8.27 cm/s LVOT diam:     2.00 cm      LV E/e' lateral: 16.9 LV SV:         62 LV SV Index:   26           2D Longitudinal Strain LVOT Area:     3.14 cm     2D Strain GLS (A4C):   5.9 % LV IVRT:       48 msec      2D Strain GLS (A3C):   6.1 %                             2D Strain GLS (A2C):   8.2 %                             2D Strain GLS Avg:     6.7 % LV Volumes (MOD) LV vol d, MOD A2C: 147.0 ml LV vol d, MOD A4C: 112.0 ml LV vol s, MOD A2C: 83.5 ml LV vol s, MOD A4C: 72.4 ml LV SV MOD A2C:     63.5 ml LV SV MOD A4C:     112.0 ml LV SV MOD BP:      47.7 ml RIGHT VENTRICLE RV Basal diam:  4.00 cm RV Mid diam:    3.50 cm RV S prime:     11.90 cm/s TAPSE (M-mode): 1.8 cm LEFT ATRIUM             Index        RIGHT ATRIUM           Index LA diam:        4.00 cm 1.72 cm/m   RA Area:     15.20 cm LA Vol (A2C):   97.8 ml 42.06 ml/m  RA Volume:   31.10 ml  13.37 ml/m LA Vol (A4C):   91.0  ml 39.13 ml/m LA Biplane Vol: 96.0 ml 41.28 ml/m  AORTIC VALVE AV Area (Vmax):    1.27 cm AV Area (Vmean):   1.28 cm AV Area (VTI):     1.51 cm AV Vmax:           277.25 cm/s AV Vmean:          183.000 cm/s AV VTI:            0.409 m AV Peak Grad:      30.7 mmHg AV  Mean Grad:      16.2 mmHg LVOT Vmax:         112.00 cm/s LVOT Vmean:        74.500 cm/s LVOT VTI:          0.196 m LVOT/AV VTI ratio: 0.48 AI PHT:            206 msec  AORTA Ao Root diam: 2.70 cm MITRAL VALVE                  TRICUSPID VALVE MV Area (PHT): 5.31 cm       TR Peak grad:   26.4 mmHg MV Area VTI:   2.15 cm       TR Vmax:        257.00 cm/s MV Peak grad:  12.4 mmHg MV Mean grad:  6.0 mmHg       SHUNTS MV Vmax:       1.76 m/s       Systemic VTI:  0.20 m MV Vmean:      119.0 cm/s     Systemic Diam: 2.00 cm MV Decel Time: 143 msec MR Peak grad:    108.6 mmHg MR Mean grad:    74.0 mmHg MR Vmax:         521.00 cm/s MR Vmean:        418.0 cm/s MR PISA:         4.02 cm MR PISA Eff ROA: 25 mm MR PISA Radius:  0.80 cm MV E velocity: 140.00 cm/s MV A velocity: 114.00 cm/s MV E/A ratio:  1.23 Dwayne D Callwood MD Electronically signed by Cara JONETTA Lovelace MD Signature Date/Time: 01/25/2024/2:49:15 PM    Final    CT ABDOMEN PELVIS WO CONTRAST Result Date: 01/25/2024 EXAM: CT ABDOMEN AND PELVIS WITHOUT CONTRAST 01/25/2024 11:06:03 AM TECHNIQUE: CT of the abdomen and pelvis was performed without the administration of intravenous contrast. Multiplanar reformatted images are provided for review. Automated exposure control, iterative reconstruction, and/or weight-based adjustment of the mA/kV was utilized to reduce the radiation dose to as low as reasonably achievable. COMPARISON: 09/07/2023 CLINICAL HISTORY: Sepsis. FINDINGS: LOWER CHEST: Coronary and aortic atherosclerosis with moderate cardiomegaly. Trace bilateral pleural effusions with mild atelectasis in the lung bases. Hazy airspace opacity medially in the left lower lobe on image 9 series 4 is nonspecific, early bronchopneumonia not excluded. LIVER: The liver is unremarkable. GALLBLADDER AND BILE DUCTS: Contracted gallbladder. No biliary ductal dilatation. SPLEEN: Abnormal hypodense lesions in the spleen measuring up to 10.5 x 4.5 x 4.0 cm (volume = 99  cm\S\3), not present on 09/07/2023. Splenic abscess is not excluded. PANCREAS: No acute abnormality. ADRENAL GLANDS: No acute abnormality. KIDNEYS, URETERS AND BLADDER: Scattered hypodense and hyperdense renal lesions similar to previous, suspicious for polycystic kidney disease. Enhancement not assessed on today's noncontrast examination. Overall morphology similar to the 09/07/2023 exam. No stones in the kidneys or ureters. No hydronephrosis. No perinephric or periureteral stranding. Urinary bladder is  unremarkable. GI AND BOWEL: Stomach demonstrates no acute abnormality. Stable dilated appendix at 9 mm diameter on image 73 series 2 with internal high density suspicious for appendicolith, similar appearance on 09/07/2023. Given the chronic prominence, an underlying appendiceal mass cannot be readily excluded. No current periappendiceal inflammatory findings. There is no bowel obstruction. PERITONEUM AND RETROPERITONEUM: No ascites. No free air. New nonspecific presacral edema. VASCULATURE: Aorta is normal in caliber. Atherosclerosis atheromatous plaque at the origin of the superior mesenteric artery. LYMPH NODES: No lymphadenopathy. REPRODUCTIVE ORGANS: Stable prostatomegaly. BONES AND SOFT TISSUES: Low level subcutaneous edema along the flanks. Bridging spurring of both sacroiliac joints. 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 Grade 1 degenerative anterolisthesis at L4-L5 with lower lumbar spondylosis and degenerative disc disease and suspected bilateral foraminal stenosis at L3-L4 and L4-L5 with borderline bilateral foraminal stenosis at L5-S1. A small umbilical hernia contains adipose tissue. IMPRESSION: 1. Abnormal hypodense lesions in the spleen measuring up to 10.5 x 4.5 x 4.0 cm (volume = 99 cm\S\3), not present on prior exam, raising concern for splenic abscesses or hematomas. 2. Stable dilated appendix at 9 mm diameter with internal high density suspicious for appendicolith. Given  the chronic prominence, an underlying appendiceal mass cannot be readily excluded. 3. Hazy airspace opacity medially in the left lower lobe, nonspecific, with early bronchopneumonia not excluded. 4. Scattered hypodense and hyperdense renal lesions are suspicious for polycystic kidney disease. 5. New nonspecific presacral edema. 6. 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 7. Grade 1 degenerative anterolisthesis at L4-5 with lower lumbar spondylosis and degenerative disc disease and suspected bilateral foraminal stenosis at L3-4 and L4-5 with borderline bilateral foraminal stenosis at L5-S1. 8. Coronary and aortic atherosclerosis with moderate cardiomegaly. 9. Trace bilateral pleural effusions with mild atelectasis in the lung bases. 10. Stable prostatomegaly. Impression #1 will be telephoned to the referring provider or the referring provider's representative by professional radiology assistant [facility]) personnel, with communication documented in the clario dashboard. Electronically signed by: Ryan Salvage MD MD 01/25/2024 11:37 AM EST RP Workstation: [id number]     ECHO 07/2023: 1. Left ventricular ejection fraction, by estimation, is 55 to 60%. The left ventricle has normal function. The left ventricle has no regional wall motion abnormalities. The left ventricular internal cavity size was mildly dilated. There is moderate concentric left ventricular hypertrophy. Left ventricular diastolic parameters are consistent with Grade III diastolic dysfunction  (restrictive).   2. Right ventricular systolic function is normal. The right ventricular  size is normal.   3. Left atrial size was moderately dilated.   4. Right atrial size was mild to moderately dilated.   5. The mitral valve is grossly normal. Trivial mitral valve regurgitation.   6. Tricuspid valve regurgitation is mild to moderate.   7. The aortic valve is grossly normal. Aortic valve regurgitation is  mild. Mild aortic  valve stenosis.    TELEMETRY (personally reviewed): Atrial flutter rate 110s  EKG (personally reviewed): Atrial flutter rate 111 bpm, PVCs  DATA reviewed by me 01/27/24: last 24h vitals tele labs imaging I/O, hospitalist progress note, PCCM notes  Principal Problem:   Chest pain Active Problems:   Chronic atrial fibrillation (HCC)   Type 2 diabetes mellitus with chronic kidney disease, without long-term current use of insulin  (HCC)   BPH (benign prostatic hyperplasia)   End-stage renal disease on hemodialysis (HCC)   Influenza   Pressure injury of skin   Bacteremia   Splenic abscess   Bacteremia due to Enterococcus    ASSESSMENT  AND PLAN: Jeffery Joseph is a 84 y.o. 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 Cardiology was consulted for further evaluation.   # Chronic atrial flutter # Acute on chronic HFpEF # ESRD on HD # Anemia Patient presented with worsening shortness of breath and chest tightness.  EKG without acute ischemic changes but did demonstrate atrial flutter which he has a known history of.  Hemoglobin was 4.5 on admission and he received transfusion, up to 9.1 this a.m.  Started on IV heparin . - Heparin  had been held in preparation for IR procedure yesterday. Remains on hold until MRA brain and carotid US  are completed and neurology has given ok to resume.  - MRI brain yesterday revealed acute stroke, CTA suggestive of splenic infarct.  Plan for IR procedure today for splenic collection.  Blood cultures positive for Enterococcus faecalis.  ID recommending TEE which I have tentatively scheduled for today.  Plan discussed with patient and daughter. - Suspect troponin secondary to demand ischemia.  - Continue amiodarone  100 mg daily.  Dose was decreased at outpatient appointment  due to borderline slow heart rate.  Could consider transitioning to IV infusions however rates have remained stable and he appears relatively asymptomatic. - Continue atorvastatin  80 mg daily. - HD as per nephrology, appreciate their recommendations.  This patient's case was discussed and created with Dr. Florencio and he is in agreement.  Signed:  Danita Bloch, PA-C  01/27/2024, 9:58 AM [facility]

## 2024-01-28 DIAGNOSIS — E1122 Type 2 diabetes mellitus with diabetic chronic kidney disease: Secondary | ICD-10-CM | POA: Diagnosis not present

## 2024-01-28 DIAGNOSIS — B952 Enterococcus as the cause of diseases classified elsewhere: Secondary | ICD-10-CM | POA: Diagnosis not present

## 2024-01-28 DIAGNOSIS — D735 Infarction of spleen: Secondary | ICD-10-CM | POA: Diagnosis not present

## 2024-01-28 DIAGNOSIS — I6381 Other cerebral infarction due to occlusion or stenosis of small artery: Secondary | ICD-10-CM | POA: Diagnosis not present

## 2024-01-28 DIAGNOSIS — I4891 Unspecified atrial fibrillation: Secondary | ICD-10-CM | POA: Diagnosis not present

## 2024-01-28 DIAGNOSIS — R7881 Bacteremia: Secondary | ICD-10-CM | POA: Diagnosis not present

## 2024-01-28 DIAGNOSIS — R6521 Severe sepsis with septic shock: Secondary | ICD-10-CM | POA: Diagnosis not present

## 2024-01-28 DIAGNOSIS — E1151 Type 2 diabetes mellitus with diabetic peripheral angiopathy without gangrene: Secondary | ICD-10-CM | POA: Diagnosis not present

## 2024-01-28 DIAGNOSIS — N186 End stage renal disease: Secondary | ICD-10-CM | POA: Diagnosis not present

## 2024-01-28 DIAGNOSIS — A4181 Sepsis due to Enterococcus: Secondary | ICD-10-CM | POA: Diagnosis not present

## 2024-01-28 DIAGNOSIS — D733 Abscess of spleen: Secondary | ICD-10-CM | POA: Diagnosis not present

## 2024-01-28 DIAGNOSIS — I959 Hypotension, unspecified: Secondary | ICD-10-CM | POA: Diagnosis not present

## 2024-01-28 DIAGNOSIS — G9340 Encephalopathy, unspecified: Secondary | ICD-10-CM | POA: Diagnosis not present

## 2024-01-28 DIAGNOSIS — Z992 Dependence on renal dialysis: Secondary | ICD-10-CM | POA: Diagnosis not present

## 2024-01-28 DIAGNOSIS — I120 Hypertensive chronic kidney disease with stage 5 chronic kidney disease or end stage renal disease: Secondary | ICD-10-CM | POA: Diagnosis not present

## 2024-01-28 LAB — CBC
HCT: 28.1 % — ABNORMAL LOW (ref 39.0–52.0)
Hemoglobin: 8.8 g/dL — ABNORMAL LOW (ref 13.0–17.0)
MCH: 24.8 pg — ABNORMAL LOW (ref 26.0–34.0)
MCHC: 31.3 g/dL (ref 30.0–36.0)
MCV: 79.2 fL — ABNORMAL LOW (ref 80.0–100.0)
Platelets: 229 K/uL (ref 150–400)
RBC: 3.55 MIL/uL — ABNORMAL LOW (ref 4.22–5.81)
RDW: 19.3 % — ABNORMAL HIGH (ref 11.5–15.5)
WBC: 9 K/uL (ref 4.0–10.5)
nRBC: 0 % (ref 0.0–0.2)

## 2024-01-28 LAB — PHOSPHORUS: Phosphorus: 3.8 mg/dL (ref 2.5–4.6)

## 2024-01-28 LAB — GLUCOSE, CAPILLARY
Glucose-Capillary: 137 mg/dL — ABNORMAL HIGH (ref 70–99)
Glucose-Capillary: 108 mg/dL — ABNORMAL HIGH (ref 70–99)
Glucose-Capillary: 116 mg/dL — ABNORMAL HIGH (ref 70–99)
Glucose-Capillary: 146 mg/dL — ABNORMAL HIGH (ref 70–99)

## 2024-01-28 LAB — APTT: aPTT: 50 s — ABNORMAL HIGH (ref 24–36)

## 2024-01-28 LAB — BASIC METABOLIC PANEL WITH GFR
Anion gap: 11 (ref 5–15)
BUN: 30 mg/dL — ABNORMAL HIGH (ref 8–23)
CO2: 26 mmol/L (ref 22–32)
Calcium: 8.7 mg/dL — ABNORMAL LOW (ref 8.9–10.3)
Chloride: 93 mmol/L — ABNORMAL LOW (ref 98–111)
Creatinine, Ser: 3.53 mg/dL — ABNORMAL HIGH (ref 0.61–1.24)
GFR, Estimated: 16 mL/min — ABNORMAL LOW
Glucose, Bld: 146 mg/dL — ABNORMAL HIGH (ref 70–99)
Potassium: 4 mmol/L (ref 3.5–5.1)
Sodium: 130 mmol/L — ABNORMAL LOW (ref 135–145)

## 2024-01-28 LAB — PROTIME-INR
INR: 1.5 — ABNORMAL HIGH (ref 0.8–1.2)
Prothrombin Time: 18.5 s — ABNORMAL HIGH (ref 11.4–15.2)

## 2024-01-28 LAB — HEPARIN LEVEL (UNFRACTIONATED): Heparin Unfractionated: 0.18 [IU]/mL — ABNORMAL LOW (ref 0.30–0.70)

## 2024-01-28 LAB — MAGNESIUM: Magnesium: 2.2 mg/dL (ref 1.7–2.4)

## 2024-01-28 LAB — TROPONIN T, HIGH SENSITIVITY: Troponin T High Sensitivity: 426 ng/L (ref 0–19)

## 2024-01-28 MED ORDER — ENSURE PLUS HIGH PROTEIN PO LIQD
237.0000 mL | Freq: Two times a day (BID) | ORAL | Status: DC
  Administered 2024-01-28 – 2024-02-01 (×7): 237 mL via ORAL

## 2024-01-28 MED ORDER — ALBUMIN HUMAN 25 % IV SOLN
25.0000 g | Freq: Two times a day (BID) | INTRAVENOUS | Status: AC
  Administered 2024-01-28 – 2024-01-29 (×4): 25 g via INTRAVENOUS
  Filled 2024-01-28 (×4): qty 100

## 2024-01-28 MED ORDER — HYDROCORTISONE SOD SUC (PF) 100 MG IJ SOLR
50.0000 mg | Freq: Four times a day (QID) | INTRAMUSCULAR | Status: DC
  Administered 2024-01-28 – 2024-02-01 (×16): 50 mg via INTRAVENOUS
  Filled 2024-01-28 (×15): qty 2

## 2024-01-28 MED ORDER — HEPARIN SODIUM (PORCINE) 1000 UNIT/ML DIALYSIS: 1000.0000 [IU] | INTRAMUSCULAR | Status: DC | PRN

## 2024-01-28 MED ORDER — PENTAFLUOROPROP-TETRAFLUOROETH EX AERO: 1.0000 | INHALATION_SPRAY | CUTANEOUS | Status: DC | PRN

## 2024-01-28 MED ORDER — LIDOCAINE-PRILOCAINE 2.5-2.5 % EX CREA: 1.0000 | TOPICAL_CREAM | CUTANEOUS | Status: DC | PRN

## 2024-01-28 MED ORDER — HEPARIN (PORCINE) 25000 UT/250ML-% IV SOLN: 1150.0000 [IU]/h | INTRAVENOUS | Status: DC

## 2024-01-28 MED ORDER — PENTAFLUOROPROP-TETRAFLUOROETH EX AERO
1.0000 | INHALATION_SPRAY | CUTANEOUS | Status: DC | PRN
  Filled 2024-01-28: qty 30

## 2024-01-28 MED ORDER — HEPARIN SODIUM (PORCINE) 1000 UNIT/ML DIALYSIS
1000.0000 [IU] | INTRAMUSCULAR | Status: DC | PRN
  Administered 2024-01-31: 1000 [IU]
  Filled 2024-01-28: qty 1

## 2024-01-28 MED ORDER — HEPARIN (PORCINE) 25000 UT/250ML-% IV SOLN
1200.0000 [IU]/h | INTRAVENOUS | Status: DC
  Administered 2024-01-28: 1150 [IU]/h via INTRAVENOUS
  Administered 2024-01-29: 1200 [IU]/h via INTRAVENOUS
  Filled 2024-01-28 (×2): qty 250

## 2024-01-28 NOTE — Progress Notes (Signed)
 "   Referring Provider(s): Arthea Platt, PA-C  Supervising Physician: Karalee Beat  Patient Status:  Jeffery Joseph  Chief Complaint: Splenic Fluid collection s/p drain placement on 01/26/24  Brief History:  84 year old male with a history of dementia on Aricept , ESRD on HD, DM, HTN, A-fib on Eliquis , chronic anemia, and recent flu infection who presented to the ED on 1/10 with complaints of worsening shortness of breath and weakness despite having previously taken Tamiflu . He was admitted for functional decline and chest pain in the setting of elevated Troponin. Overnight on 1/11, patient was noted to be increasingly lethargic and hypotensive. He was started on IV levophed  and transferred to the ICU where he has remained on pressors. WBC minimally elevated to 11.3 on 1/13 and blood cultures from 1/11 positive for enterococcus faecalis. Due to infection concern patient underwent CT A/P on 1/13 which revealed concern for possible splenic abscess. IR consulted and patient underwent drain placement on 1/14.  Subjective:  Patient resting in bed. Continues to report persistent nausea, but denies any pain around his drain site. Denies any fevers/chills.  Allergies: Patient has no known allergies.  Medications: Prior to Admission medications  Medication Sig Start Date End Date Taking? Authorizing Provider  acetaminophen  (TYLENOL ) 325 MG tablet Take 650 mg by mouth every 6 (six) hours as needed.   Yes [provider]  ALPRAZolam  (XANAX ) 0.5 MG tablet Take 0.5 mg by mouth 3 (three) times daily as needed for sleep. 02/05/21  Yes [provider]  amiodarone  (PACERONE ) 200 MG tablet Take 2 tablets (400 mg total) by mouth 2 (two) times daily for 7 days, THEN 1 tablet (200 mg total) daily. 07/24/23 01/22/24 Yes Sreenath, Sudheer B, MD  apixaban  (ELIQUIS ) 2.5 MG TABS tablet Take 1 tablet (2.5 mg total) by mouth 2 (two) times daily. 07/24/23 01/23/24 Yes Sreenath, Sudheer B, MD   Cholecalciferol  125 MCG (5000 UT) TABS Take 5,000 Units by mouth daily.   Yes [provider]  clopidogrel  (PLAVIX ) 75 MG tablet Take 1 tablet (75 mg total) by mouth daily. 06/09/23  Yes Schnier, Cordella MATSU, MD  diclofenac  Sodium (VOLTAREN ) 1 % GEL Apply 2 g topically 4 (four) times daily. 07/24/23  Yes Sreenath, Sudheer B, MD  Docusate Sodium  (DSS) 100 MG CAPS Take 100 mg by mouth at bedtime.   Yes [provider]  donepezil  (ARICEPT ) 10 MG tablet Take 10 mg by mouth at bedtime. 05/14/21  Yes [provider]  finasteride  (PROSCAR ) 5 MG tablet Take 1 tablet (5 mg total) by mouth daily. 08/12/19  Yes Stoioff, Glendia BROCKS, MD  lidocaine -prilocaine  (EMLA ) cream Apply 1 Application topically as needed (Dialysis). 08/18/23  Yes [provider]  metoprolol  succinate (TOPROL -XL) 25 MG 24 hr tablet Take 0.5 tablets (12.5 mg total) by mouth daily. 07/24/23 01/22/24 Yes Sreenath, Sudheer B, MD  tamsulosin  (FLOMAX ) 0.4 MG CAPS capsule Take 1 capsule (0.4 mg total) by mouth daily. 08/12/19  Yes Stoioff, Glendia BROCKS, MD  enalapril  (VASOTEC ) 20 MG tablet Take 20 mg by mouth 2 (two) times daily. Patient not taking: Reported on 01/20/2024 08/12/21   [provider]  furosemide  (LASIX ) 40 MG tablet Take 1 tablet (40 mg total) by mouth every other day. Patient not taking: Reported on 01/20/2024 07/25/23   Jhonny Calvin NOVAK, MD  glipiZIDE  (GLUCOTROL  XL) 5 MG 24 hr tablet Take by mouth. Patient not taking: Reported on 01/20/2024 12/01/11   [provider]  LOKELMA 10 g PACK packet Take 1 packet  by mouth daily. Patient not taking: Reported on 01/20/2024 07/12/23   [provider]  sodium bicarbonate  650 MG tablet Take 650 mg by mouth 2 (two) times daily. Patient not taking: Reported on 01/20/2024 06/16/23   [provider]    Vital Signs: BP (!) 122/46 (BP Location: Right Arm)   Pulse (!) 113   Temp 98.1 F (36.7 C) (Oral)   Resp (!) 22   Ht 6' 5 (1.956 m)   Wt 236 lb  12.4 oz (107.4 kg)   SpO2 95%   BMI 28.08 kg/m   Physical Exam Vitals reviewed.  Constitutional:      Appearance: Normal appearance.  Cardiovascular:     Rate and Rhythm: Normal rate.  Pulmonary:     Effort: Pulmonary effort is normal.  Abdominal:     General: Abdomen is flat.     Palpations: Abdomen is soft.     Tenderness: There is no abdominal tenderness.     Comments: Left flank drain in place with clean/dry overlying bandage. Drain with ~5cc serosanguinous fluid in bulb to suction. Non-tender to palpation  Skin:    General: Skin is warm and dry.  Neurological:     Mental Status: He is alert. Mental status is at baseline.    Drain Location: Left Flank Size: Fr size: 12 Fr Date of placement: 01/26/24  Currently to: Drain collection device: suction bulb 24 hour output:  Output by Drain (mL) 01/26/24 0701 - 01/26/24 1900 01/26/24 1901 - 01/27/24 0700 01/27/24 0701 - 01/27/24 1900 01/27/24 1901 - 01/28/24 0700 01/28/24 0701 - 01/28/24 1712  Closed System Drain 1 Inferior;Lateral;Left Back Bulb (JP) 12 Fr. 140 10  20     Current examination: Insertion site unremarkable. Suture and stat lock in place. Dressed appropriately.    Labs:  CBC: Recent Labs    01/25/24 1309 01/26/24 0327 01/27/24 0505 01/28/24 0425  WBC 11.3* 10.5 8.5 9.0  HGB 9.1* 8.6* 8.9* 8.8*  HCT 28.6* 27.1* 28.5* 28.1*  PLT 202 193 227 229    COAGS: Recent Labs    01/23/24 0343 01/23/24 1859 01/24/24 0317 01/24/24 1419 01/24/24 2318 01/25/24 0802 01/26/24 0949 01/28/24 1400  INR 2.1* 2.2*  --   --   --   --  1.6* 1.5*  APTT 48* 49*   < > 47* 71* 79*  --  50*   < > = values in this interval not displayed.    BMP: Recent Labs    01/25/24 0330 01/26/24 0327 01/27/24 0505 01/28/24 0425  NA 130* 128* 132* 130*  K 3.3* 3.5 3.8 4.0  CL 93* 93* 94* 93*  CO2 27 26 27 26   GLUCOSE 147* 137* 135* 146*  BUN 35* 39* 26* 30*  CALCIUM  8.3* 8.3* 8.8* 8.7*  CREATININE 3.62* 3.77* 2.97*  3.53*  GFRNONAA 16* 15* 20* 16*    LIVER FUNCTION TESTS: Recent Labs    07/17/23 2042 07/18/23 0342 01/22/24 1745 01/24/24 0317 01/24/24 1554 01/25/24 0330 01/27/24 0505  BILITOT 1.0  --  0.5 0.9  --  0.7  --   AST 23  --  73* 34  --  22  --   ALT 35  --  70* 48*  --  33  --   ALKPHOS 54  --  86 66  --  64  --   PROT 6.6  --  7.6 6.7  --  6.5  --   ALBUMIN  3.2*   < > 3.1* 2.7* 2.7*  2.6* 2.6*   < > = values in this interval not displayed.    Assessment and Plan:  Splenic Fluid Collection: ARRIN PINTOR is a 84 y.o. male with a history of dementia, ESRD on HD, T2DM, chronic anemia, A-fib on Eliquis , and recent flu infection who was admitted to the ED for functional decline and chest pain with elevated troponin likely secondary to demand ischemia. Noted to have rising WBC count (minimal at 11.8) and positive blood cultures. Imaging demonstrating for splenic fluid collection and IR consulted for drain placement which patient underwent on 1/14.  -Exam today with ~5cc serosanguinous output in bulb to suction; non-tender to palpation -Around 20mL serosanguinous fluid out yesterday  -10mL out today per RN -Plan for likely drain removal on Monday if output remains low   IR will continue to follow - please call with questions or concerns.   Thank you for allowing our service to participate in CONROY GORACKE 's care.   Electronically Signed: Lokelani Lutes M Keoni Havey, PA-C 01/28/2024, 5:04 PM    I spent a total of 15 Minutes at the the patient's bedside AND on the patient's hospital floor or unit, greater than 50% of which was counseling/coordinating care for drain care follow up. "

## 2024-01-28 NOTE — Progress Notes (Signed)
 " [facility]  ROUNDING NOTE   Subjective:  Jeffery Joseph, 84 yo male is known to our practice, outpatient dialysis center at [facility] [address] And is followed by Dr. Marcelino.  Update:   Patient resting comfortably in bed. Due for hemodialysis treatment today. Splenic drain remains in place. Being treated for Enterococcus faecalis bacteremia.  Objective:  Vital signs in last 24 hours:  Temp:  [98 F (36.7 C)-98.5 F (36.9 C)] 98 F (36.7 C) (01/16 0800) Pulse Rate:  [31-114] 112 (01/16 1200) Resp:  [15-29] 22 (01/16 1200) BP: (75-144)/(26-51) 103/47 (01/16 1200) SpO2:  [76 %-100 %] 100 % (01/16 1200) Weight:  [102.5 kg] 102.5 kg (01/16 0451)  Weight change: 0.9 kg Filed Weights   01/26/24 1644 01/26/24 1935 01/28/24 0451  Weight: 101.6 kg 101 kg 102.5 kg    Intake/Output: I/O last 3 completed shifts: In: 1723 [I.V.:1073.8; IV Piggyback:649.3] Out: 2330 [Urine:1300; Drains:30; Other:1000]   Intake/Output this shift:  No intake/output data recorded.  Physical Exam: General: NAD  Head: Normocephalic  Eyes: Anicteric  Neck: Supple, trachea midline  Lungs:  Clear to auscultation, normal effort  Heart: Regular rate and rhythm  Abdomen:  Soft, nontender, splenic drain in place  Extremities: No peripheral edema.  Neurologic: Alert and awake  Skin: No lesions  Access: Left AVF    Basic Metabolic Panel: Recent Labs  Lab 01/24/24 0317 01/24/24 1554 01/25/24 0330 01/26/24 0327 01/27/24 0505 01/28/24 0425  NA 135 132* 130* 128* 132* 130*  K 3.7 3.6 3.3* 3.5 3.8 4.0  CL 97* 94* 93* 93* 94* 93*  CO2 25 27 27 26 27 26   GLUCOSE 120* 173* 147* 137* 135* 146*  BUN 52* 31* 35* 39* 26* 30*  CREATININE 5.05* 3.37* 3.62* 3.77* 2.97* 3.53*  CALCIUM  8.5* 8.3* 8.3* 8.3* 8.8* 8.7*  MG 2.0  --  1.9 1.8 1.9 2.2  PHOS 3.9 2.5 2.7 3.2 3.1 3.8    Liver Function Tests: Recent Labs  Lab 01/22/24 1745 01/24/24 0317 01/24/24 1554 01/25/24 0330 01/27/24 0505  AST 73*  34  --  22  --   ALT 70* 48*  --  33  --   ALKPHOS 86 66  --  64  --   BILITOT 0.5 0.9  --  0.7  --   PROT 7.6 6.7  --  6.5  --   ALBUMIN  3.1* 2.7* 2.7* 2.6* 2.6*   No results for input(s): LIPASE, AMYLASE in the last 168 hours. No results for input(s): AMMONIA in the last 168 hours.  CBC: Recent Labs  Lab 01/25/24 0330 01/25/24 1309 01/26/24 0327 01/27/24 0505 01/28/24 0425  WBC 9.5 11.3* 10.5 8.5 9.0  NEUTROABS  --  8.8*  --   --   --   HGB 8.7* 9.1* 8.6* 8.9* 8.8*  HCT 28.4* 28.6* 27.1* 28.5* 28.1*  MCV 79.1* 77.3* 78.1* 78.9* 79.2*  PLT 185 202 193 227 229    Cardiac Enzymes: No results for input(s): CKTOTAL, CKMB, CKMBINDEX, TROPONINI in the last 168 hours.  BNP: Invalid input(s): POCBNP  CBG: Recent Labs  Lab 01/27/24 1122 01/27/24 1728 01/27/24 2123 01/28/24 0738 01/28/24 1112  GLUCAP 110* 134* 121* 137* 108*    Microbiology: Results for orders placed or performed during the hospital encounter of 01/22/24  Culture, blood (Routine X 2) w Reflex to ID Panel     Status: Abnormal   Collection Time: 01/23/24 10:33 PM   Specimen: BLOOD RIGHT ARM  Result Value Ref  Range Status   Specimen Description   Final    BLOOD RIGHT ARM Performed at [facility], [address] [place], [place] [address]    Special Requests   Final    BOTTLES DRAWN AEROBIC AND ANAEROBIC Blood Culture results may not be optimal due to an inadequate volume of blood received in culture bottles Performed at [facility], [address] [place], [place] [address]    Culture  Setup Time   Final    GRAM POSITIVE COCCI IN BOTH AEROBIC AND ANAEROBIC BOTTLES CRITICAL RESULT CALLED TO, READ BACK BY AND VERIFIED WITH: KRISTAN MERRILL 01/24/24 1252 MU Performed at [facility], [address] [place], [place] [address]    Culture (A)  Final    ENTEROCOCCUS FAECALIS SUSCEPTIBILITIES PERFORMED ON PREVIOUS CULTURE WITHIN THE LAST 5 DAYS. Performed  at [facility], [address] [place], [place] [address]    Report Status 01/26/2024 FINAL  Final  Resp panel by RT-PCR (RSV, Flu A&B, Covid) Anterior Nasal Swab     Status: None   Collection Time: 01/23/24 10:33 PM   Specimen: Anterior Nasal Swab  Result Value Ref Range Status   SARS Coronavirus 2 by RT PCR NEGATIVE NEGATIVE Final    Comment: (NOTE) SARS-CoV-2 target nucleic acids are NOT DETECTED.  The SARS-CoV-2 RNA is generally detectable in upper respiratory specimens during the acute phase of infection. The lowest concentration of SARS-CoV-2 viral copies this assay can detect is 138 copies/mL. A negative result does not preclude SARS-Cov-2 infection and should not be used as the sole basis for treatment or other patient management decisions. A negative result may occur with  improper specimen collection/handling, submission of specimen other than nasopharyngeal swab, presence of viral mutation(s) within the areas targeted by this assay, and inadequate number of viral copies(<138 copies/mL). A negative result must be combined with clinical observations, patient history, and epidemiological information. The expected result is Negative.  Fact Sheet for Patients:  bloggercourse.com  Fact Sheet for Healthcare Providers:  seriousbroker.it  This test is no t yet approved or cleared by the [place]  FDA and  has been authorized for detection and/or diagnosis of SARS-CoV-2 by FDA under an Emergency Use Authorization (EUA). This EUA will remain  in effect (meaning this test can be used) for the duration of the COVID-19 declaration under Section 564(b)(1) of the Act, 21 U.S.C.section 360bbb-3(b)(1), unless the authorization is terminated  or revoked sooner.       Influenza A by PCR NEGATIVE NEGATIVE Final   Influenza B by PCR NEGATIVE NEGATIVE Final    Comment: (NOTE) The Xpert Xpress SARS-CoV-2/FLU/RSV plus assay is  intended as an aid in the diagnosis of influenza from Nasopharyngeal swab specimens and should not be used as a sole basis for treatment. Nasal washings and aspirates are unacceptable for Xpert Xpress SARS-CoV-2/FLU/RSV testing.  Fact Sheet for Patients: bloggercourse.com  Fact Sheet for Healthcare Providers: seriousbroker.it  This test is not yet approved or cleared by the [place]  FDA and has been authorized for detection and/or diagnosis of SARS-CoV-2 by FDA under an Emergency Use Authorization (EUA). This EUA will remain in effect (meaning this test can be used) for the duration of the COVID-19 declaration under Section 564(b)(1) of the Act, 21 U.S.C. section 360bbb-3(b)(1), unless the authorization is terminated or revoked.     Resp Syncytial Virus by PCR NEGATIVE NEGATIVE Final    Comment: (NOTE) Fact Sheet for Patients: bloggercourse.com  Fact Sheet for  Healthcare Providers: seriousbroker.it  This test is not yet approved or cleared by the [place]  FDA and has been authorized for detection and/or diagnosis of SARS-CoV-2 by FDA under an Emergency Use Authorization (EUA). This EUA will remain in effect (meaning this test can be used) for the duration of the COVID-19 declaration under Section 564(b)(1) of the Act, 21 U.S.C. section 360bbb-3(b)(1), unless the authorization is terminated or revoked.  Performed at [facility], [address] [place], [place] [address]   Respiratory (~20 pathogens) panel by PCR     Status: None   Collection Time: 01/23/24 10:33 PM   Specimen: Nasopharyngeal Swab; Respiratory  Result Value Ref Range Status   Adenovirus NOT DETECTED NOT DETECTED Final   Coronavirus 229E NOT DETECTED NOT DETECTED Final    Comment: (NOTE) The Coronavirus on the Respiratory Panel, DOES NOT test for the novel  Coronavirus (2019 nCoV)     Coronavirus HKU1 NOT DETECTED NOT DETECTED Final   Coronavirus NL63 NOT DETECTED NOT DETECTED Final   Coronavirus OC43 NOT DETECTED NOT DETECTED Final   Metapneumovirus NOT DETECTED NOT DETECTED Final   Rhinovirus / Enterovirus NOT DETECTED NOT DETECTED Final   Influenza A NOT DETECTED NOT DETECTED Final   Influenza B NOT DETECTED NOT DETECTED Final   Parainfluenza Virus 1 NOT DETECTED NOT DETECTED Final   Parainfluenza Virus 2 NOT DETECTED NOT DETECTED Final   Parainfluenza Virus 3 NOT DETECTED NOT DETECTED Final   Parainfluenza Virus 4 NOT DETECTED NOT DETECTED Final   Respiratory Syncytial Virus NOT DETECTED NOT DETECTED Final   Bordetella pertussis NOT DETECTED NOT DETECTED Final   Bordetella Parapertussis NOT DETECTED NOT DETECTED Final   Chlamydophila pneumoniae NOT DETECTED NOT DETECTED Final   Mycoplasma pneumoniae NOT DETECTED NOT DETECTED Final    Comment: Performed at [facility], [address], [place], [place] [address]  Culture, blood (Routine X 2) w Reflex to ID Panel     Status: Abnormal   Collection Time: 01/24/24 12:14 AM   Specimen: BLOOD LEFT HAND  Result Value Ref Range Status   Specimen Description   Final    BLOOD LEFT HAND Performed at [facility] [address] [place], [place] [address]    Special Requests   Final    BOTTLES DRAWN AEROBIC AND ANAEROBIC Blood Culture results may not be optimal due to an inadequate volume of blood received in culture bottles Performed at [facility], [address], [place], [place] [address]    Culture  Setup Time   Final    GRAM POSITIVE COCCI IN BOTH AEROBIC AND ANAEROBIC BOTTLES CRITICAL RESULT CALLED TO, READ BACK BY AND VERIFIED WITH: MAYA CHARLENA NAPOLEON [id number] @ 2318 FH Performed at [facility], [address] [place], [place] [address]    Culture ENTEROCOCCUS FAECALIS (A)  Final   Report Status 01/26/2024 FINAL  Final   Organism ID, Bacteria ENTEROCOCCUS FAECALIS  Final       Susceptibility   Enterococcus faecalis - MIC*    AMPICILLIN  <=2 SENSITIVE Sensitive     VANCOMYCIN  1 SENSITIVE Sensitive     GENTAMICIN SYNERGY SENSITIVE Sensitive     * ENTEROCOCCUS FAECALIS  Blood Culture ID Panel (Reflexed)     Status: Abnormal   Collection Time: 01/24/24 12:14 AM  Result Value Ref Range Status   Enterococcus faecalis DETECTED (A) NOT DETECTED Final    Comment: CRITICAL RESULT CALLED TO, READ BACK BY AND VERIFIED WITH: PHARMD  E. ELESA [id number] @ 2318 FH    Enterococcus Faecium NOT DETECTED NOT DETECTED Final   Listeria monocytogenes NOT DETECTED NOT DETECTED Final   Staphylococcus species NOT DETECTED NOT DETECTED Final   Staphylococcus aureus (BCID) NOT DETECTED NOT DETECTED Final   Staphylococcus epidermidis NOT DETECTED NOT DETECTED Final   Staphylococcus lugdunensis NOT DETECTED NOT DETECTED Final   Streptococcus species NOT DETECTED NOT DETECTED Final   Streptococcus agalactiae NOT DETECTED NOT DETECTED Final   Streptococcus pneumoniae NOT DETECTED NOT DETECTED Final   Streptococcus pyogenes NOT DETECTED NOT DETECTED Final   A.calcoaceticus-baumannii NOT DETECTED NOT DETECTED Final   Bacteroides fragilis NOT DETECTED NOT DETECTED Final   Enterobacterales NOT DETECTED NOT DETECTED Final   Enterobacter cloacae complex NOT DETECTED NOT DETECTED Final   Escherichia coli NOT DETECTED NOT DETECTED Final   Klebsiella aerogenes NOT DETECTED NOT DETECTED Final   Klebsiella oxytoca NOT DETECTED NOT DETECTED Final   Klebsiella pneumoniae NOT DETECTED NOT DETECTED Final   Proteus species NOT DETECTED NOT DETECTED Final   Salmonella species NOT DETECTED NOT DETECTED Final   Serratia marcescens NOT DETECTED NOT DETECTED Final   Haemophilus influenzae NOT DETECTED NOT DETECTED Final   Neisseria meningitidis NOT DETECTED NOT DETECTED Final   Pseudomonas aeruginosa NOT DETECTED NOT DETECTED Final   Stenotrophomonas maltophilia NOT DETECTED NOT DETECTED Final   Candida  albicans NOT DETECTED NOT DETECTED Final   Candida auris NOT DETECTED NOT DETECTED Final   Candida glabrata NOT DETECTED NOT DETECTED Final   Candida krusei NOT DETECTED NOT DETECTED Final   Candida parapsilosis NOT DETECTED NOT DETECTED Final   Candida tropicalis NOT DETECTED NOT DETECTED Final   Cryptococcus neoformans/gattii NOT DETECTED NOT DETECTED Final   Vancomycin  resistance NOT DETECTED NOT DETECTED Final    Comment: Performed at [facility], [address], [place], [place] [address]  MRSA Next Gen by PCR, Nasal     Status: None   Collection Time: 01/24/24  2:03 AM   Specimen: Nasal Mucosa; Nasal Swab  Result Value Ref Range Status   MRSA by PCR Next Gen NOT DETECTED NOT DETECTED Final    Comment: (NOTE) The GeneXpert MRSA Assay (FDA approved for NASAL specimens only), is one component of a comprehensive MRSA colonization surveillance program. It is not intended to diagnose MRSA infection nor to guide or monitor treatment for MRSA infections. Test performance is not FDA approved in patients less than 25 years old. Performed at [facility], [address] [place], [place] [address]   Aerobic/Anaerobic Culture w Gram Stain (surgical/deep wound)     Status: None (Preliminary result)   Collection Time: 01/26/24 12:27 PM   Specimen: Abscess  Result Value Ref Range Status   Specimen Description   Final    ABSCESS Performed at [facility], [address] [place], [place] [address]    Special Requests   Final    Normal WD Performed at [facility], [address] [place], [place] [address]    Gram Stain   Final    MODERATE WBC PRESENT, PREDOMINANTLY PMN MODERATE GRAM POSITIVE COCCI Performed at [facility], [address] [place], [place] [address]    Culture ABUNDANT GRAM NEGATIVE RODS  Final   Report Status PENDING  Incomplete  Culture, blood (Routine X 2) w Reflex to ID Panel     Status: None (Preliminary result)    Collection Time: 01/27/24  5:05 AM   Specimen: BLOOD  Result Value Ref Range Status   Specimen Description BLOOD BLOOD RIGHT HAND  Final   Special Requests   Final    BOTTLES DRAWN AEROBIC AND ANAEROBIC Blood Culture adequate volume   Culture   Final    NO GROWTH < 24 HOURS Performed at [facility], [address], [place], [place] [address]    Report Status PENDING  Incomplete  Culture, blood (Routine X 2) w Reflex to ID Panel     Status: None (Preliminary result)   Collection Time: 01/27/24  5:09 AM   Specimen: BLOOD  Result Value Ref Range Status   Specimen Description BLOOD BLOOD RIGHT HAND  Final   Special Requests   Final    BOTTLES DRAWN AEROBIC AND ANAEROBIC Blood Culture adequate volume   Culture   Final    NO GROWTH < 24 HOURS Performed at [facility], [address], [place], [place] [address]    Report Status PENDING  Incomplete    Coagulation Studies: Recent Labs    01/26/24 0949  LABPROT 19.8*  INR 1.6*    Urinalysis: No results for input(s): COLORURINE, LABSPEC, PHURINE, GLUCOSEU, HGBUR, BILIRUBINUR, KETONESUR, PROTEINUR, UROBILINOGEN, NITRITE, LEUKOCYTESUR in the last 72 hours.  Invalid input(s): APPERANCEUR    Imaging: US  Carotid Bilateral Result Date: 01/28/2024 EXAM: US  CAROTID DUPLEX 01/27/2024 05:06:51 PM TECHNIQUE: Real-time grayscale, color flow and spectral Doppler sonographic images were obtained of the extracranial carotid system using a linear transducer. COMPARISON: None available CLINICAL HISTORY: [id number] Stroke (cerebrum) (HCC) [id number] Stroke (cerebrum) (HCC) Hypertension, diabetes. FINDINGS: RIGHT: Common carotid artery: 96 cm/s. Mild intimal thickening through the right common carotid artery with some eccentric calcified plaque. Internal carotid artery: 100 cm/s. Partial calcified plaque in the bulb and proximal ICA resulting in some areas of distal acoustic shadowing. Normal waveforms and color  Doppler signal in visualized segments. External carotid artery: 72 cm/s Right ICA/CCA ratio: 1.0 Plaque: Moderate, Calcified. Vertebral artery: Antegrade LEFT: Common carotid artery: 158 cm/s. Mild tortuosity of the common carotid. Partially calcified plaque throughout the length of the common carotid without high-grade stenosis. Internal carotid artery: 55 cm/s. Mild partial calcified plaque in the bulb without high-grade stenosis. Focal area of distal acoustic shadowing in the proximal ICA. Normal color Doppler signal throughout. External carotid artery: 79 cm/s LEFT ICA/CCA ratio: 0.3 Plaque: Moderate, Partially calcified. Vertebral artery: Antegrade IMPRESSION: 1. No hemodynamically significant stenosis (greater than 50%) within the extracranial internal carotid arteries. 2. Mild intimal thickening and eccentric calcified plaque in the right common carotid artery with partial calcified plaque in the right bulb and proximal ICA. 3. Partially calcified plaque throughout the left common carotid artery and mild partial calcified plaque in the left bulb, without high-grade stenosis. 4. Antegrade flow within the bilateral vertebral arteries. Electronically signed by: Katheleen Faes MD 01/28/2024 07:18 AM EST RP Workstation: [id number]   MR ANGIO HEAD WO CONTRAST Result Date: 01/28/2024 CLINICAL DATA:  Follow-up examination for acute stroke. EXAM: MRA HEAD WITHOUT CONTRAST TECHNIQUE: Angiographic images of the Circle of Willis were acquired using MRA technique without intravenous contrast. COMPARISON:  Comparison made with prior brain MRI from 01/25/2024. FINDINGS: Anterior circulation: Examination degraded by motion artifact. Both internal carotid arteries are patent through the siphons without convincing stenosis or other abnormality. Apparent irregularity and narrowing about the supraclinoid right ICA on MIP reconstructions appears to be most consistent with artifact on source time-of-flight images (series 1015,  image 11). A1 segments patent bilaterally. Normal anterior communicating artery complex. Both ACAs  are patent to their distal aspects without visible stenosis. No M1 stenosis or occlusion. No proximal MCA branch occlusion or high-grade stenosis. Distal MCA branches are perfused and symmetric. Posterior circulation: Right vertebral artery dominant and widely patent. Diminutive left vertebral artery patent as well without visible stenosis. Right PICA patent. Left PICA not seen. Basilar patent without stenosis. Superior cerebellar and posterior cerebral arteries patent bilaterally. Anatomic variants: Dominant right vertebral artery. Other: No intracranial aneurysm. IMPRESSION: Negative intracranial MRA. No large vessel occlusion. No hemodynamically significant or correctable stenosis. Electronically Signed   By: Morene Hoard M.D.   On: 01/28/2024 00:51   DG Chest Port 1 View Result Date: 01/27/2024 CLINICAL DATA:  Central line EXAM: PORTABLE CHEST 1 VIEW COMPARISON:  01/23/2024, 01/22/2024, 07/19/2023 FINDINGS: Left IJ central venous catheter tip overlies the brachiocephalic confluence. Cardiomegaly with central vascular congestion. Worsening airspace disease at the left lung base compared to prior. No evidence of a pneumothorax. Left axillary stent. IMPRESSION: 1. Left IJ central venous catheter tip overlies the brachiocephalic confluence. No pneumothorax. 2. Cardiomegaly with central vascular congestion and mild edema. Worsening airspace disease at the left lung base, atelectasis versus pneumonia. Electronically Signed   By: Luke Bun M.D.   On: 01/27/2024 15:29     Medications:    albumin  human     ampicillin  (OMNIPEN) IV 2 g (01/28/24 1038)   cefTRIAXone  (ROCEPHIN )  IV 2 g (01/28/24 1044)   dexmedetomidine  (PRECEDEX ) IV infusion Stopped (01/27/24 2149)   norepinephrine  (LEVOPHED ) Adult infusion 12 mcg/min (01/28/24 1110)    amiodarone   100 mg Oral Daily   Chlorhexidine  Gluconate Cloth  6  each Topical Daily   cholecalciferol   5,000 Units Oral Daily   diclofenac  Sodium  2 g Topical QID   docusate sodium   100 mg Oral QHS   donepezil   10 mg Oral QHS   feeding supplement  237 mL Oral BID BM   finasteride   5 mg Oral Daily   hydrocortisone  sod succinate (SOLU-CORTEF ) inj  50 mg Intravenous Q6H   insulin  aspart  0-15 Units Subcutaneous TID WC   insulin  aspart  0-5 Units Subcutaneous QHS   midodrine   10 mg Oral TID WC   pantoprazole  (PROTONIX ) IV  40 mg Intravenous Daily   tamsulosin   0.4 mg Oral Daily   acetaminophen  **OR** acetaminophen , ALPRAZolam , calcium  carbonate, fentaNYL  (SUBLIMAZE ) injection, lidocaine  (PF), magnesium  hydroxide, midazolam  PF, nitroGLYCERIN , ondansetron  **OR** ondansetron  (ZOFRAN ) IV, mouth rinse, traZODone   Assessment/ Plan:  Jeffery Joseph is a 84 y.o.  male  with ESRD on hemodialysis, hypertension, diabetes mellitus type II, BPH, chronic afib, peripheral arterial disease, and diastolic congestive heart failure who presented to hospital with atypical chest pain found to have hgb 4.5 and elevated troponin.   Outpatient dialysis [facility] [address]/MWF/AVF and is followed by Dr. Marcelino  End stage Renal Disease on hemodialysis  Patient due for hemodialysis treatment today.  Limit UF today..  Anemia with chronic kidney disease and suspected acute blood loss.  Hgb 4.5, POA. Suspect GI bleed. Fecal occult positive.  Hemoglobin currently 8.8.   Hypotension/Enterococcus faecalis sepsis  Currently on ampicillin  and ceftriaxone .  Maintained on norepinephrine  drip at this time.  TEE being considered however this was deferred now given hypotension.  Diabetes mellitus type II with chronic kidney disease  Most recent A1C 5.9 01/23/24. SSI managed by primary team  5. Chronic Afib  Patient on amiodarone .  6.  Possible splenic abscess/fluid collection.  Patient is status post splenic drain placement 01/26/2024.  LOS: 5 Fynn Adel 1/16/202612:50 PM  "

## 2024-01-28 NOTE — Progress Notes (Signed)
" °   01/28/24 1645  Vitals  Temp 97.7 F (36.5 C)  Temp Source Oral  BP (!) 120/45  MAP (mmHg) 66  BP Location Right Arm  BP Method Automatic  Patient Position (if appropriate) Lying  Pulse Rate (!) 111  Pulse Rate Source Monitor  ECG Heart Rate (!) 113  Resp (!) 22  Oxygen Therapy  SpO2 100 %  O2 Device Room Air  Patient Activity (if Appropriate) In bed  Pulse Oximetry Type Continuous  Oximetry Probe Site Changed No  During Treatment Monitoring  Blood Flow Rate (mL/min) 0 mL/min  Arterial Pressure (mmHg) 41.41 mmHg  Venous Pressure (mmHg) -325.04 mmHg  TMP (mmHg) -248.27 mmHg  Ultrafiltration Rate (mL/min) 778 mL/min  Dialysate Flow Rate (mL/min) 0 ml/min  Dialysate Potassium Concentration 3  Dialysate Calcium  Concentration 2.5  Duration of HD Treatment -hour(s) 2.57 hour(s)  Cumulative Fluid Removed (mL) per Treatment  763.6  HD Safety Checks Performed Yes  Intra-Hemodialysis Comments See progress note (machine ended at 56 mins left. Transmembrane ereror on machine and machine started going off. error message 2- end tx. unable to rinse pt back. system clotted. notified icu nurse and np breeze, no new set up.)  Post Treatment  Dialyzer Clearance Clotted  Hemodialysis Intake (mL) 0 mL  Liters Processed 60.1  Fluid Removed (mL) 800 mL  Tolerated HD Treatment Yes  Note  Patient Observations  (pt had 56 mins left. error mesasage 1 check  transmembrane -87 and error message 2 end tx. unable to rinse pt back. notified np Sports Coach and press photographer. no new set up. [name])   Removed 800 ml. Med given Albumin  25g and Midodrine  10 mg. Pt alert and oriented no pain. Report given  to icu nurse.about tx ended 56 mins early "

## 2024-01-28 NOTE — Progress Notes (Signed)
 "  NAME:  Jeffery Joseph, MRN:  [id number], DOB:  [birth year], LOS: 5 ADMISSION DATE:  01/22/2024, CONSULTATION DATE: 01/24/2024 REFERRING MD: Dr. 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 Symptoms began several days prior to presentation. Patient was taking oseltamivir  for possible influenza. He reported dry cough and denied nausea, vomiting, abdominal pain, diarrhea, melena, or hematochezia. Last hemodialysis session was on Friday prior to admission.  ED Course: On arrival, vital signs: BP 106/49 mmHg, HR 111 bpm, RR 20, T 62F, SpO? 100% on room air. Initial labs showed WBC 11.8 K/L, Hgb 8.0 g/dL, platelets 790 K/L, sodium 134 mmol/L, potassium 3.5 mmol/L, BUN 46 mg/dL, creatinine 4.7 mg/dL, lactate 2.2 mmol/L, AST 73 U/L, ALT 70 U/L. Chest X-ray demonstrated cardiomegaly without acute consolidation. CT chest without contrast showed cardiomegaly and no acute airspace disease. Patient received 500 mL IV normal saline bolus, famotidine , and antacids and was admitted to TRH service for observation.   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), T2DM, HTN, PAD, chronic atrial fibrillation on anticoagulation, chronic anemia, anxiety/depression, and BPH   Micro Data  COVID/Influenza A&B/RSV 01/11>>negative  RVP 01/11>>negative  Blood x2 01/11>>enterococcus Faecalis  MRSA PCR 01/12>>negative  Tracheal aspirate 01/13>>  Significant Hospital Events: Including procedures, antibiotic start and stop dates in addition to other pertinent events   1/11: Admitted to trh service 1/12: Became hypotensive  started on levophed , critical care consulted 1/13: Pt continues to require levophed  gtt complains of his stomach not feeling good CT   Abd Pelvis pending concerning for splenic fluid collection concerning for possible abscess, Surgery and IR consulted, recommends aspiration and drain placement.  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 1/14: No acute events overnight, afebrile, remains on Levophed  (weaned to 6 mcg).  More awake and alert, consult Neurology for acute CVA on MRI yesterday.  Tentative plan for IR to perform splenic aspiration/drain today.  Cardiology tentatively planning for TEE tomorrow. 01/27/24- patient with sepsis bacteremia, for TEE today.   Had splenic drain (cultures with GPC+) and dialysis yesterday. ID on case is on ampicillin .  Concern for AV graft fistula contamination.   01/28/24- less output from splenic drain this am, making some urine and gets dialysis.  He's on levophed  14mcg. Starting solucortef for suspected arenal insufficiency.    Interim History / Subjective:  As outlined above under significant events   Objective    Blood pressure (!) 117/43, pulse (!) 111, temperature 98.4 F (36.9 C), temperature source Oral, resp. rate 18, height 6' 5 (1.956 m), weight 102.5 kg, SpO2 95%.        Intake/Output Summary (Last 24 hours) at 01/28/2024 0947 Last data filed at 01/28/2024 0700 Gross per 24 hour  Intake 1097.77 ml  Output 1120 ml  Net -22.23 ml   Filed Weights   01/26/24 1644 01/26/24 1935 01/28/24 0451  Weight: 101.6 kg 101 kg 102.5 kg    Examination: General: Acute on chronically-ill appearing male, NAD on RA  HENT: Supple, no JVD  Lungs: Diminished throughout, even, non labored  Cardiovascular: Sinus tachycardia, no m/r/g, 2+ radial/1+, trace generalized edema Abdomen: Hypoactive BS x4, obese, taut,  non tender  Extremities: Normal bulk and tone, moves all extremities  Neuro: Alert and oriented x2, follows commands, PERRLA GU:  Deferred   Assessment and Plan   #Acute CVA #Dementia #Depression  MRI Brain 01/25/24: 5 mm acute infarct at the left temporooccipital junction: Mild chronic small vessel ischemic disease. -Treatment of metabolic derangements as outlined above -Provide supportive care -Promote normal sleep/wake cycle and family presence -Avoid sedating medications as able -Neurology consulted, appreciate input  -Continue outpatient donepezil  and prn xanax    #Shock: Septic and hypovolemic -present on admission - due to bacteremia and spelnic abscess #Elevated troponin due to demand ischemia  #Chronic atrial flutter/fibrillation   #Acute on chronic HFpEF  Hx: HTN and PAD  -Echocardiogram 01/25/24: LVEF 45 to 50%, grade 2 diastolic dysfunction, RV systolic function is low normal, RV size mildly enlarged mild to moderate mitral valve regurgitation, mild to moderate tricuspid valve regurgitation, mild aortic valve regurgitation, mild aortic valve stenosis -Continuous cardiac monitoring -Maintain MAP >65 -Cautious IV fluids -Vasopressors as needed to maintain MAP goal -Continue Midodrine   -Trend lactic acid until normalized -HS Troponin peaked at 789 -Volume removal with dialysis -Cardiology following, appreciate inpnt -Continue scheduled amiodarone  and finasteride  -Holding Anticoagulation for now given pending IR aspiration of splenic abscess and new CVA  #Severe Sepsis due to present on admission - due to bacteremia with E. faecalis #Enterococcus Faecalis Bacteremia -on IV ampicillin  and rocephin  per ID #Splenic Abscess- s/p PCD and IV abx, may need splenectomy if fails #Previous influenza infection  -Monitor fever curve -Trend WBC's & Procalcitonin -Follow cultures as above -ID following, appreciate input, Continue empiric Ampicillin  pending cultures & sensitivities - General Surgery and IR consulted, appreciate input ~tentative plan for drainage of abscess by IR on 1/14 -Tentative plan for TEE  with cardiology on 1/15 -Continue Tamiflu    #Pulmonary edema  - Supplemental O2 for dyspnea and/or hypoxia - Maintain O2 sats 92% or higher  - Prn CXR's  - Fluid removal as tolerated during HD   #ESRD on HD  #Hypokalemia  #Hyponatremia  #Hypomagnesia  - Trend BMP  - Replace electrolytes as indicated  - Strict I&O's - Nephrology consulted appreciate input: HD per recommendations   #Anemia of chronic kidney disease  - Trend CBC  - Monitor for s/sx of bleeding  - Transfuse for hgb <7  Type II diabetes mellitus  - CBG's ac/hs - SSI  - Target CBG readings 140 to 180 - Follow hypo/hyperglycemic protocol     Labs   CBC: Recent Labs  Lab 01/25/24 0330 01/25/24 1309 01/26/24 0327 01/27/24 0505 01/28/24 0425  WBC 9.5 11.3* 10.5 8.5 9.0  NEUTROABS  --  8.8*  --   --   --   HGB 8.7* 9.1* 8.6* 8.9* 8.8*  HCT 28.4* 28.6* 27.1* 28.5* 28.1*  MCV 79.1* 77.3* 78.1* 78.9* 79.2*  PLT 185 202 193 227 229    Basic Metabolic Panel: Recent Labs  Lab 01/24/24 0317 01/24/24 1554 01/25/24 0330 01/26/24 0327 01/27/24 0505 01/28/24 0425  NA 135 132* 130* 128* 132* 130*  K 3.7 3.6 3.3* 3.5 3.8 4.0  CL 97* 94* 93* 93* 94* 93*  CO2 25 27 27 26 27 26   GLUCOSE 120* 173* 147* 137* 135* 146*  BUN 52* 31* 35* 39* 26* 30*  CREATININE 5.05* 3.37* 3.62* 3.77* 2.97* 3.53*  CALCIUM  8.5* 8.3* 8.3* 8.3* 8.8* 8.7*  MG 2.0  --  1.9 1.8 1.9 2.2  PHOS 3.9 2.5 2.7 3.2 3.1 3.8  GFR: Estimated Creatinine Clearance: 19.6 mL/min (A) (by C-G formula based on SCr of 3.53 mg/dL (H)). Recent Labs  Lab 01/23/24 0323 01/23/24 0430 01/23/24 2200 01/24/24 0317 01/24/24 2318 01/25/24 0330 01/25/24 1158 01/25/24 1309 01/26/24 0327 01/27/24 0505 01/28/24 0425  PROCALCITON  --   --  100.00  --   --   --   --   --   --   --   --   WBC  --    < >  --    < >  --    < >  --  11.3* 10.5 8.5 9.0  LATICACIDVEN 1.5  --  1.4  --  1.5  --  2.1*  --   --   --   --    < > = values in this interval not  displayed.    Liver Function Tests: Recent Labs  Lab 01/22/24 1745 01/24/24 0317 01/24/24 1554 01/25/24 0330 01/27/24 0505  AST 73* 34  --  22  --   ALT 70* 48*  --  33  --   ALKPHOS 86 66  --  64  --   BILITOT 0.5 0.9  --  0.7  --   PROT 7.6 6.7  --  6.5  --   ALBUMIN  3.1* 2.7* 2.7* 2.6* 2.6*   No results for input(s): LIPASE, AMYLASE in the last 168 hours. No results for input(s): AMMONIA in the last 168 hours.  ABG    Component Value Date/Time   HCO3 27.2 01/23/2024 2300   TCO2 17 (L) 11/14/2021 0718   O2SAT 81.4 01/23/2024 2300     Coagulation Profile: Recent Labs  Lab 01/23/24 0343 01/23/24 1859 01/26/24 0949  INR 2.1* 2.2* 1.6*    Cardiac Enzymes: No results for input(s): CKTOTAL, CKMB, CKMBINDEX, TROPONINI in the last 168 hours.  HbA1C: Hgb A1c MFr Bld  Date/Time Value Ref Range Status  01/23/2024 04:30 AM 5.9 (H) 4.8 - 5.6 % Final    Comment:    (NOTE) Diagnosis of Diabetes The following HbA1c ranges recommended by the American Diabetes Association (ADA) may be used as an aid in the diagnosis of diabetes mellitus.  Hemoglobin             Suggested A1C NGSP%              Diagnosis  <5.7                   Non Diabetic  5.7-6.4                Pre-Diabetic  >6.4                   Diabetic  <7.0                   Glycemic control for                       adults with diabetes.    01/23/2024 03:23 AM 5.7 (H) 4.8 - 5.6 % Final    Comment:    (NOTE) Diagnosis of Diabetes The following HbA1c ranges recommended by the American Diabetes Association (ADA) may be used as an aid in the diagnosis of diabetes mellitus.  Hemoglobin             Suggested A1C NGSP%              Diagnosis  <5.7  Non Diabetic  5.7-6.4                Pre-Diabetic  >6.4                   Diabetic  <7.0                   Glycemic control for                       adults with diabetes.      CBG: Recent Labs  Lab 01/27/24 0728  01/27/24 1122 01/27/24 1728 01/27/24 2123 01/28/24 0738  GLUCAP 122* 110* 134* 121* 137*    Review of Systems: Positives in BOLD   Gen: Denies fever, chills, weight change, fatigue, night sweats HEENT: Denies blurred vision, double vision, hearing loss, tinnitus, sinus congestion, rhinorrhea, sore throat, neck stiffness, dysphagia PULM: Denies shortness of breath, cough, sputum production, hemoptysis, wheezing CV: Denies chest pain, edema, orthopnea, paroxysmal nocturnal dyspnea, palpitations GI: abdominal discomfort, nausea, vomiting, diarrhea, hematochezia, melena, constipation, change in bowel habits GU: Denies dysuria, hematuria, polyuria, oliguria, urethral discharge Endocrine: Denies hot or cold intolerance, polyuria, polyphagia or appetite change Derm: Denies rash, dry skin, scaling or peeling skin change Heme: Denies easy bruising, bleeding, bleeding gums Neuro: Denies headache, numbness, weakness, slurred speech, loss of memory or consciousness  Past Medical History:  He,  has a past medical history of Anxiety, Arthritis, Depression, Diabetes mellitus without complication (HCC), End stage renal disease (HCC), Hypertension, Pneumonia, and Renal disorder.   Surgical History:   Past Surgical History:  Procedure Laterality Date   A/V FISTULAGRAM Left 06/08/2023   Procedure: A/V Fistulagram;  Surgeon: Jama Cordella MATSU, MD;  Location: [facility];  Service: Cardiovascular;  Laterality: Left;   A/V SHUNT INTERVENTION Left 09/29/2023   Procedure: A/V SHUNT INTERVENTION;  Surgeon: Marea Selinda RAMAN, MD;  Location: [facility];  Service: Cardiovascular;  Laterality: Left;   APPLICATION OF WOUND VAC Right 11/14/2021   Procedure: APPLICATION OF WOUND VAC;  Surgeon: Jama Cordella MATSU, MD;  Location: [facility] OR[facility];  Service: Vascular;  Laterality: Right;   AV FISTULA PLACEMENT Left 11/14/2021   Procedure: ARTERIOVENOUS (AV) FISTULA CREATION ( BRACHIAL CEPHALIC);  Surgeon: Jama Cordella MATSU, MD;  Location: [facility] OR[facility];  Service: Vascular;  Laterality: Left;     Social History:   reports that he has never smoked. He has never used smokeless tobacco. He reports that he does not currently use drugs. He reports that he does not drink alcohol .   Family History:  His family history is not on file.   Allergies Allergies[1]   Home Medications  Prior to Admission medications  Medication Sig Start Date End Date Taking? Authorizing Provider  acetaminophen  (TYLENOL ) 325 MG tablet Take 650 mg by mouth every 6 (six) hours as needed.   Yes [provider]  ALPRAZolam  (XANAX ) 0.5 MG tablet Take 0.5 mg by mouth 3 (three) times daily as needed for sleep. 02/05/21  Yes [provider]  amiodarone  (PACERONE ) 200 MG tablet Take 2 tablets (400 mg total) by mouth 2 (two) times daily for 7 days, THEN 1 tablet (200 mg total) daily. 07/24/23 01/22/24 Yes Sreenath, Sudheer B, MD  apixaban  (ELIQUIS ) 2.5 MG TABS tablet Take 1 tablet (2.5 mg total) by mouth 2 (two) times daily. 07/24/23 01/23/24 Yes Sreenath, Sudheer B, MD  Cholecalciferol  125 MCG (5000 UT) TABS Take 5,000 Units by mouth daily.  Yes [provider]  clopidogrel  (PLAVIX ) 75 MG tablet Take 1 tablet (75 mg total) by mouth daily. 06/09/23  Yes Schnier, Cordella MATSU, MD  diclofenac  Sodium (VOLTAREN ) 1 % GEL Apply 2 g topically 4 (four) times daily. 07/24/23  Yes Sreenath, Sudheer B, MD  Docusate Sodium  (DSS) 100 MG CAPS Take 100 mg by mouth at bedtime.   Yes [provider]  donepezil  (ARICEPT ) 10 MG tablet Take 10 mg by mouth at bedtime. 05/14/21  Yes [provider]  finasteride  (PROSCAR ) 5 MG tablet Take 1 tablet (5 mg total) by mouth daily. 08/12/19  Yes Stoioff, Glendia BROCKS, MD  lidocaine -prilocaine  (EMLA ) cream Apply 1 Application topically as needed (Dialysis). 08/18/23  Yes [provider]  metoprolol  succinate (TOPROL -XL) 25 MG 24 hr tablet Take 0.5 tablets (12.5 mg total) by mouth daily.  07/24/23 01/22/24 Yes Sreenath, Sudheer B, MD  tamsulosin  (FLOMAX ) 0.4 MG CAPS capsule Take 1 capsule (0.4 mg total) by mouth daily. 08/12/19  Yes Stoioff, Glendia BROCKS, MD  enalapril  (VASOTEC ) 20 MG tablet Take 20 mg by mouth 2 (two) times daily. Patient not taking: Reported on 01/20/2024 08/12/21   [provider]  furosemide  (LASIX ) 40 MG tablet Take 1 tablet (40 mg total) by mouth every other day. Patient not taking: Reported on 01/20/2024 07/25/23   Jhonny Calvin NOVAK, MD  glipiZIDE  (GLUCOTROL  XL) 5 MG 24 hr tablet Take by mouth. Patient not taking: Reported on 01/20/2024 12/01/11   [provider]  LOKELMA 10 g PACK packet Take 1 packet by mouth daily. Patient not taking: Reported on 01/20/2024 07/12/23   [provider]  sodium bicarbonate  650 MG tablet Take 650 mg by mouth 2 (two) times daily. Patient not taking: Reported on 01/20/2024 06/16/23   [provider]     Critical care provider statement:   Total critical care time: 33 minutes   Performed by: Parris MD   Critical care time was exclusive of separately billable procedures and treating other patients.   Critical care was necessary to treat or prevent imminent or life-threatening deterioration.   Critical care was time spent personally by me on the following activities: development of treatment plan with patient and/or surrogate as well as nursing, discussions with consultants, evaluation of patient's response to treatment, examination of patient, obtaining history from patient or surrogate, ordering and performing treatments and interventions, ordering and review of laboratory studies, ordering and review of radiographic studies, pulse oximetry and re-evaluation of patient's condition.    Cayson Kalb, M.D.  Pulmonary & Critical Care Medicine                 [1] No Known Allergies  "

## 2024-01-28 NOTE — Progress Notes (Addendum)
 0800 Alert and oriented except for date of the month. Talkative. Daughters in to visit. Ate well-breakfast and lunch. 1200 Ate all of his lunch. 1800 Good appetite today. Family in and out. External catheter replaced.

## 2024-01-28 NOTE — Consult Note (Signed)
 PHARMACY - ANTICOAGULATION CONSULT NOTE  Pharmacy Consult for IV Heparin  Indication: atrial fibrillation  Allergies[1]  Patient Measurements: Height: 6' 5 (195.6 cm) Weight: 107.4 kg (236 lb 12.4 oz) IBW/kg (Calculated) : 89.1 HEPARIN  DW (KG): 96.9  Labs: Recent Labs    01/26/24 0327 01/26/24 0949 01/27/24 0505 01/28/24 0425  HGB 8.6*  --  8.9* 8.8*  HCT 27.1*  --  28.5* 28.1*  PLT 193  --  227 229  LABPROT  --  19.8*  --   --   INR  --  1.6*  --   --   CREATININE 3.77*  --  2.97* 3.53*    Estimated Creatinine Clearance: 21.2 mL/min (A) (by C-G formula based on SCr of 3.53 mg/dL (H)).   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 Source thought to be from splenic abscess now s/p drain placement. Patient suffered acute stroke as seen on MRI this admission. Pharmacy consulted to dose heparin  for Afib.  Patient's last dose of apixaban  was on 01/23/24. Suspect effect on anti-Xa should be washed out at this point but with dialysis may take longer.  Re-check baseline aPTT, INR and heparin  level. CBC notable for chronic anemia.  Goal of Therapy:  Heparin  level 0.3-0.7 units/ml aPTT 66 - 102 seconds Monitor platelets by anticoagulation protocol: Yes   Plan:  --aPTT level was last therapeutic with heparin  gtt rate of 1150 units/hr so will re-start heparin  at this rate --Check aPTT or HL 8 hours from re-initiation depending on baseline labs --Daily CBC per protocol while on IV heparin   Marolyn KATHEE Mare 01/28/2024,1:48 PM    [1] No Known Allergies

## 2024-01-28 NOTE — Progress Notes (Signed)
 STROKE TEAM PROGRESS NOTE   SUBJECTIVE (INTERVAL HISTORY) - Splenic drain placed 1/15 for tx splenic abscess, cultures with GPC+, currently on ampicillin  per ID - TTE was scheduled for today but was canceled 2/2 hypotension. Per cardiology note this AM since he is requiring more levo today they plan to defer and reconsider next week.  - Solucortef started for suspected adrenal insufficiency - No acute neurologic changes   OBJECTIVE Temp:  [98.2 F (36.8 C)-98.5 F (36.9 C)] 98.4 F (36.9 C) (01/16 0200) Pulse Rate:  [89-114] 111 (01/16 0645) Cardiac Rhythm: Sinus tachycardia (01/16 0707) Resp:  [14-28] 18 (01/16 0645) BP: (75-144)/(26-52) 117/43 (01/16 0645) SpO2:  [91 %-100 %] 95 % (01/16 0645) Weight:  [102.5 kg] 102.5 kg (01/16 0451)   PHYSICAL EXAM  Temp:  [98.2 F (36.8 C)-98.5 F (36.9 C)] 98.4 F (36.9 C) (01/16 0200) Pulse Rate:  [89-114] 111 (01/16 0645) Resp:  [14-28] 18 (01/16 0645) BP: (75-144)/(26-52) 117/43 (01/16 0645) SpO2:  [91 %-100 %] 95 % (01/16 0645) Weight:  [102.5 kg] 102.5 kg (01/16 0451)  General - well nourished, well developed, in no apparent distress.     Ophthalmologic - fundi not visualized due to noncooperation.     Cardiovascular - irregularly irregular heart rate and rhythm.   Neuro - awake, alert, eyes open, orientated to age, place, time and people and situation. No aphasia, paucity of speech, naming 3/4 and able to repeat, following all simple commands. No gaze palsy, tracking bilaterally, visual field full, PERRL. No facial droop. Tongue midline. Bilateral UEs 5/5, no drift but with mild asterixis. Bilaterally LEs 4/5, no drift but with mild asterixis. Sensation symmetrical bilaterally, b/l FTN intact, gait not tested.      Component Value Date/Time   CHOL 82 01/27/2024 0505   TRIG 79 01/27/2024 0505   HDL 23 (L) 01/27/2024 0505   CHOLHDL 3.5 01/27/2024 0505   VLDL 16 01/27/2024 0505   LDLCALC 42 01/27/2024 0505   Lab Results   Component Value Date   HGBA1C 5.9 (H) 01/23/2024   No results found for: LABOPIA, COCAINSCRNUR, LABBENZ, AMPHETMU, THCU, LABBARB  No results for input(s): ETH in the last 168 hours.  I have personally reviewed the radiological images below and agree with the radiology interpretations.  MRI brain wo 1. 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 2. Mild chronic small vessel ischemic disease.  MRA head wo No large vessel occlusion. No hemodynamically significant or correctable stenosis. No identifiable mycotic aneurysm.    Carotid US  1. No hemodynamically significant stenosis (greater than 50%) within the extracranial internal carotid arteries. 2. Mild intimal thickening and eccentric calcified plaque in the right common carotid artery with partial calcified plaque in the right bulb and proximal ICA. 3. Partially calcified plaque throughout the left common carotid artery and mild partial calcified plaque in the left bulb, without high-grade stenosis. 4. Antegrade flow within the bilateral vertebral arteries.  TTE 1. Left ventricular ejection fraction, by estimation, is 45 to 50% . The left ventricle has mildly decreased function. The left ventricle demonstrates global hypokinesis. The left ventricular internal cavity size was mildly dilated. There is mild asymmetric left ventricular hypertrophy. Left ventricular diastolic parameters are consistent with Grade II diastolic dysfunction ( pseudonormalization) . The average left ventricular global longitudinal strain is 6. 7 % . The global longitudinal strain is abnormal. 2. Right ventricular systolic function is low normal. The right ventricular size is mildly enlarged. 3. Left atrial size was mildly dilated.  4. The mitral valve is grossly normal. Mild to moderate mitral valve regurgitation. 5. Tricuspid valve regurgitation is mild to moderate. 6. The aortic valve is calcified. Aortic valve regurgitation is mild.  Mild aortic valve stenosis.  TEE was scheduled for 1/16 but was canceled 2/2 hypotension. Per cardiology note this AM since he is requiring more levo today they plan to defer and reconsider next week.   Stroke Labs     Component Value Date/Time   CHOL 82 01/27/2024 0505   TRIG 79 01/27/2024 0505   HDL 23 (L) 01/27/2024 0505   CHOLHDL 3.5 01/27/2024 0505   VLDL 16 01/27/2024 0505   LDLCALC 42 01/27/2024 0505    Lab Results  Component Value Date/Time   HGBA1C 5.9 (H) 01/23/2024 04:30 AM    Assessment: 84 y.o. male with PMH of ESRD on hemodialysis, diabetes, hypertension, hyperlipidemia, PAD, A-fib on Eliquis , BPH and dementia admitted for sepsis, septic shock, splenic abscess and separate splenic infarction with Enterococcus faecalis bacteremia.  Currently on ampicillin  treatment.  MRI was done for regulation of encephalopathy found to have left temporal occipital small infarct.  Neurology consulted.   Etiology for patient's stroke not quite clear, TEE pending to evaluate endocarditis, deferred until next week 2/2 persistent hypotension.  However patient has single small stroke, not consistent with endocarditis and related stroke.  At this point favor etiology to be small vessel disease. Patient does have A-fib but was on home Eliquis .    Hgb on admission 01/23/24 4.5, required transfusion after which stable 8.8 today. Etiology favored to be 2/2 CKD. OK to start heparin  gtt for stroke risk reduction in setting of a fib (standard protocol is ok since stroke is v small 5mm). Continue current management including amlodipine  infusion, Aricept , midodrine  with Levophed .   Plan: - TEE deferred, per cardiology will reconsider next week given patient's persistent hypotension - Otherwise stroke workup completed - OK to start heparin  gtt for stroke risk reduction in setting of a fib if no contraindication (mycotic aneurysm ruled out by vascular imaging), transition to eliquis  at later point - STAT  head CT for any change in neurologic exam r/o ICH - Continue home Aricept  for dementia - Continue midodrine , wean off Levophed  as able. Avoid hypotension as much as possible although of course this is difficult in the setting of septic shock - Ambulatory referral to neurology at hospital discharge.  Neurology will sign off but please re-engage if any additional neurologic concerns arise.  Hospital day # 5  Elida Ross, MD [facility] [phone]  If 7pm- 7am, please page neurology on call as listed in AMION.

## 2024-01-28 NOTE — Progress Notes (Signed)
 "  Date of Admission:  01/22/2024     ID: Jeffery Joseph is a 84 y.o. male Principal Problem:   Chest pain Active Problems:   Chronic atrial fibrillation (HCC)   Type 2 diabetes mellitus with chronic kidney disease, without long-term current use of insulin  (HCC)   BPH (benign prostatic hyperplasia)   End-stage renal disease on hemodialysis (HCC)   Influenza   Pressure injury of skin   Bacteremia   Splenic abscess   Bacteremia due to Enterococcus    Subjective: Patient is stable  Medications:   amiodarone   100 mg Oral Daily   Chlorhexidine  Gluconate Cloth  6 each Topical Daily   cholecalciferol   5,000 Units Oral Daily   diclofenac  Sodium  2 g Topical QID   docusate sodium   100 mg Oral QHS   donepezil   10 mg Oral QHS   feeding supplement  237 mL Oral BID BM   finasteride   5 mg Oral Daily   hydrocortisone  sod succinate (SOLU-CORTEF ) inj  50 mg Intravenous Q6H   insulin  aspart  0-15 Units Subcutaneous TID WC   insulin  aspart  0-5 Units Subcutaneous QHS   midodrine   10 mg Oral TID WC   pantoprazole  (PROTONIX ) IV  40 mg Intravenous Daily   tamsulosin   0.4 mg Oral Daily    Objective: Vital signs in last 24 hours: Patient Vitals for the past 24 hrs:  BP Temp Temp src Pulse Resp SpO2 Weight  01/28/24 0645 (!) 117/43 -- -- (!) 111 18 95 % --  01/28/24 0630 (!) 119/44 -- -- (!) 111 17 96 % --  01/28/24 0615 (!) 112/45 -- -- (!) 112 17 96 % --  01/28/24 0600 (!) 110/43 -- -- (!) 112 17 97 % --  01/28/24 0545 (!) 103/41 -- -- (!) 112 17 96 % --  01/28/24 0530 (!) 109/42 -- -- (!) 112 17 95 % --  01/28/24 0515 (!) 111/42 -- -- (!) 112 17 96 % --  01/28/24 0500 (!) 113/43 -- -- (!) 112 18 96 % --  01/28/24 0451 -- -- -- (!) 114 (!) 23 98 % 102.5 kg  01/28/24 0445 (!) 115/46 -- -- (!) 112 (!) 21 96 % --  01/28/24 0430 (!) 120/46 -- -- (!) 111 (!) 26 100 % --  01/28/24 0415 (!) 125/45 -- -- (!) 112 (!) 25 100 % --  01/28/24 0400 (!) 116/44 -- -- (!) 112 20 95 % --  01/28/24 0345  (!) 115/43 -- -- (!) 112 18 95 % --  01/28/24 0330 (!) 114/43 -- -- -- 17 -- --  01/28/24 0315 (!) 112/40 -- -- (!) 112 17 94 % --  01/28/24 0300 (!) 114/40 -- -- (!) 112 18 95 % --  01/28/24 0245 (!) 118/43 -- -- (!) 112 (!) 21 96 % --  01/28/24 0230 (!) 121/44 -- -- (!) 112 19 98 % --  01/28/24 0215 (!) 119/42 -- -- (!) 112 20 97 % --  01/28/24 0200 (!) 124/42 98.4 F (36.9 C) Oral (!) 112 19 97 % --  01/28/24 0145 (!) 116/41 -- -- (!) 111 (!) 25 96 % --  01/28/24 0130 (!) 110/39 -- -- (!) 113 (!) 23 95 % --  01/28/24 0115 (!) 118/41 -- -- (!) 112 (!) 22 96 % --  01/28/24 0100 (!) 117/43 -- -- (!) 112 18 97 % --  01/28/24 0045 (!) 120/41 -- -- (!) 112 18 96 % --  01/28/24  0030 (!) 121/42 -- -- (!) 112 (!) 23 96 % --  01/28/24 0015 (!) 120/40 -- -- (!) 112 19 96 % --  01/28/24 0000 (!) 121/40 -- -- (!) 111 18 97 % --  01/27/24 2345 (!) 119/42 -- -- (!) 111 (!) 25 98 % --  01/27/24 2330 (!) 118/40 -- -- (!) 111 19 97 % --  01/27/24 2315 (!) 119/39 -- -- (!) 111 18 97 % --  01/27/24 2300 (!) 122/40 -- -- (!) 111 19 93 % --  01/27/24 2245 (!) 75/26 -- -- (!) 111 19 95 % --  01/27/24 2200 (!) 118/41 -- -- (!) 112 20 94 % --  01/27/24 2145 (!) 110/39 -- -- (!) 112 19 93 % --  01/27/24 2144 (!) 110/39 -- -- (!) 112 19 93 % --  01/27/24 2130 (!) 109/38 -- -- (!) 112 19 92 % --  01/27/24 2115 (!) 107/38 -- -- (!) 113 18 91 % --  01/27/24 2100 (!) 114/43 -- -- (!) 112 20 93 % --  01/27/24 2047 (!) 111/42 -- -- (!) 112 20 95 % --  01/27/24 2045 -- -- -- (!) 114 15 96 % --  01/27/24 2030 (!) 115/46 -- -- (!) 113 (!) 21 96 % --  01/27/24 2015 (!) 106/47 -- -- (!) 112 19 97 % --  01/27/24 2000 (!) 120/42 98.5 F (36.9 C) Oral (!) 113 (!) 23 95 % --  01/27/24 1945 (!) 120/40 -- -- (!) 113 (!) 21 96 % --  01/27/24 1930 (!) 115/45 -- -- (!) 113 (!) 21 97 % --  01/27/24 1915 (!) 116/50 -- -- (!) 112 (!) 21 96 % --  01/27/24 1900 (!) 119/47 -- -- (!) 114 (!) 26 95 % --  01/27/24 1845 (!) 121/48  -- -- -- (!) 25 -- --  01/27/24 1830 (!) 115/46 -- -- -- (!) 23 -- --  01/27/24 1815 (!) 114/41 -- -- -- (!) 21 -- --  01/27/24 1800 (!) 117/45 -- -- -- 19 -- --  01/27/24 1745 (!) 123/48 -- -- -- (!) 23 -- --  01/27/24 1730 (!) 121/45 -- -- (!) 113 (!) 28 94 % --  01/27/24 1715 (!) 128/47 -- -- (!) 112 (!) 24 94 % --  01/27/24 1700 (!) 144/51 -- -- (!) 111 20 94 % --  01/27/24 1656 (!) 130/51 98.2 F (36.8 C) Oral (!) 109 (!) 28 100 % --  01/27/24 1645 (!) 77/39 -- -- 100 (!) 25 95 % --  01/27/24 1630 (!) 83/41 -- -- (!) 108 (!) 24 96 % --  01/27/24 1615 (!) 82/42 -- -- 93 (!) 25 97 % --  01/27/24 1600 (!) 83/43 -- -- 96 (!) 22 97 % --  01/27/24 1545 (!) 84/40 -- -- 99 (!) 26 92 % --  01/27/24 1530 -- -- -- 96 20 94 % --  01/27/24 1515 -- -- -- 93 (!) 22 93 % --  01/27/24 1500 (!) 90/49 -- -- 95 (!) 23 93 % --  01/27/24 1445 (!) 94/45 -- -- 91 (!) 22 91 % --  01/27/24 1430 (!) 89/35 -- -- 93 17 94 % --  01/27/24 1415 (!) 85/37 -- -- 89 (!) 24 93 % --  01/27/24 1400 (!) 80/38 -- -- 94 20 96 % --  01/27/24 1345 (!) 85/36 -- -- (!) 110 19 96 % --  01/27/24 1330 (!) 100/40 -- -- (!) 110 17  94 % --  01/27/24 1315 (!) 101/38 -- -- (!) 110 19 96 % --  01/27/24 1300 (!) 107/44 -- -- (!) 110 (!) 21 96 % --  01/27/24 1245 (!) 107/37 -- -- (!) 110 (!) 21 96 % --  01/27/24 1230 (!) 108/38 -- -- (!) 111 19 93 % --       PHYSICAL EXAM:  General: Alert, cooperative, no distress,.  Lungs: BiLateral air entry Heart: Tachycardic  abdomen: Soft, left quadrant drain- serosanguinous Extremities: atraumatic, no cyanosis. No edema. No clubbing Skin: No rashes or lesions. Or bruising Lymph: Cervical, supraclavicular normal. Neurologic: Grossly non-focal  Lab Results    Latest Ref Rng & Units 01/28/2024    4:25 AM 01/27/2024    5:05 AM 01/26/2024    3:27 AM  CBC  WBC 4.0 - 10.5 K/uL 9.0  8.5  10.5   Hemoglobin 13.0 - 17.0 g/dL 8.8  8.9  8.6   Hematocrit 39.0 - 52.0 % 28.1  28.5  27.1    Platelets 150 - 400 K/uL 229  227  193        Latest Ref Rng & Units 01/28/2024    4:25 AM 01/27/2024    5:05 AM 01/26/2024    3:27 AM  CMP  Glucose 70 - 99 mg/dL 853  864  862   BUN 8 - 23 mg/dL 30  26  39   Creatinine 0.61 - 1.24 mg/dL 6.46  7.02  6.22   Sodium 135 - 145 mmol/L 130  132  128   Potassium 3.5 - 5.1 mmol/L 4.0  3.8  3.5   Chloride 98 - 111 mmol/L 93  94  93   CO2 22 - 32 mmol/L 26  27  26    Calcium  8.9 - 10.3 mg/dL 8.7  8.8  8.3       Microbiology: 01/24/24 [facility] Enterococcus 4/4 Repeat blood culture  01/27/2023 Splenic abscess culture pending Gram stain shows gram-positive cocci  Studies/Results: US  Carotid Bilateral Result Date: 01/28/2024 EXAM: US  CAROTID DUPLEX 01/27/2024 05:06:51 PM TECHNIQUE: Real-time grayscale, color flow and spectral Doppler sonographic images were obtained of the extracranial carotid system using a linear transducer. COMPARISON: None available CLINICAL HISTORY: 801713 Stroke (cerebrum) (HCC) 801713 Stroke (cerebrum) (HCC) Hypertension, diabetes. FINDINGS: RIGHT: Common carotid artery: 96 cm/s. Mild intimal thickening through the right common carotid artery with some eccentric calcified plaque. Internal carotid artery: 100 cm/s. Partial calcified plaque in the bulb and proximal ICA resulting in some areas of distal acoustic shadowing. Normal waveforms and color Doppler signal in visualized segments. External carotid artery: 72 cm/s Right ICA/CCA ratio: 1.0 Plaque: Moderate, Calcified. Vertebral artery: Antegrade LEFT: Common carotid artery: 158 cm/s. Mild tortuosity of the common carotid. Partially calcified plaque throughout the length of the common carotid without high-grade stenosis. Internal carotid artery: 55 cm/s. Mild partial calcified plaque in the bulb without high-grade stenosis. Focal area of distal acoustic shadowing in the proximal ICA. Normal color Doppler signal throughout. External carotid artery: 79 cm/s LEFT ICA/CCA ratio: 0.3 Plaque:  Moderate, Partially calcified. Vertebral artery: Antegrade IMPRESSION: 1. No hemodynamically significant stenosis (greater than 50%) within the extracranial internal carotid arteries. 2. Mild intimal thickening and eccentric calcified plaque in the right common carotid artery with partial calcified plaque in the right bulb and proximal ICA. 3. Partially calcified plaque throughout the left common carotid artery and mild partial calcified plaque in the left bulb, without high-grade stenosis. 4. Antegrade flow within the bilateral vertebral arteries. Electronically signed  by: Dayne Hassell MD 01/28/2024 07:18 AM EST RP Workstation: [id number]   MR ANGIO HEAD WO CONTRAST Result Date: 01/28/2024 CLINICAL DATA:  Follow-up examination for acute stroke. EXAM: MRA HEAD WITHOUT CONTRAST TECHNIQUE: Angiographic images of the Circle of Willis were acquired using MRA technique without intravenous contrast. COMPARISON:  Comparison made with prior brain MRI from 01/25/2024. FINDINGS: Anterior circulation: Examination degraded by motion artifact. Both internal carotid arteries are patent through the siphons without convincing stenosis or other abnormality. Apparent irregularity and narrowing about the supraclinoid right ICA on MIP reconstructions appears to be most consistent with artifact on source time-of-flight images (series 1015, image 11). A1 segments patent bilaterally. Normal anterior communicating artery complex. Both ACAs are patent to their distal aspects without visible stenosis. No M1 stenosis or occlusion. No proximal MCA branch occlusion or high-grade stenosis. Distal MCA branches are perfused and symmetric. Posterior circulation: Right vertebral artery dominant and widely patent. Diminutive left vertebral artery patent as well without visible stenosis. Right PICA patent. Left PICA not seen. Basilar patent without stenosis. Superior cerebellar and posterior cerebral arteries patent bilaterally. Anatomic variants:  Dominant right vertebral artery. Other: No intracranial aneurysm. IMPRESSION: Negative intracranial MRA. No large vessel occlusion. No hemodynamically significant or correctable stenosis. Electronically Signed   By: Morene Hoard M.D.   On: 01/28/2024 00:51   DG Chest Port 1 View Result Date: 01/27/2024 CLINICAL DATA:  Central line EXAM: PORTABLE CHEST 1 VIEW COMPARISON:  01/23/2024, 01/22/2024, 07/19/2023 FINDINGS: Left IJ central venous catheter tip overlies the brachiocephalic confluence. Cardiomegaly with central vascular congestion. Worsening airspace disease at the left lung base compared to prior. No evidence of a pneumothorax. Left axillary stent. IMPRESSION: 1. Left IJ central venous catheter tip overlies the brachiocephalic confluence. No pneumothorax. 2. Cardiomegaly with central vascular congestion and mild edema. Worsening airspace disease at the left lung base, atelectasis versus pneumonia. Electronically Signed   By: Luke Bun M.D.   On: 01/27/2024 15:29   CT GUIDED PERITONEAL/RETROPERITONEAL FLUID DRAIN BY PERC CATH Result Date: 01/26/2024 INDICATION: 84 year old male with sepsis and new low-attenuation fluid collection versus liquified infarct in the spleen. He presents for CT-guided aspiration with possible drain placement. EXAM: CT-guided drain placement TECHNIQUE: Multidetector CT imaging of the abdomen was performed following the standard protocol without IV contrast. RADIATION DOSE REDUCTION: This exam was performed according to the departmental dose-optimization program which includes automated exposure control, adjustment of the mA and/or kV according to patient size and/or use of iterative reconstruction technique. MEDICATIONS: The patient is currently admitted to the hospital and receiving intravenous antibiotics. The antibiotics were administered within an appropriate time frame prior to the initiation of the procedure. ANESTHESIA/SEDATION: Moderate (conscious) sedation  was employed during this procedure. A total of Versed  0.5 mg and Fentanyl  25 mcg was administered intravenously by the radiology nurse. Total intra-service moderate Sedation Time: 10 minutes. The patient's level of consciousness and vital signs were monitored continuously by radiology nursing throughout the procedure under my direct supervision. COMPLICATIONS: None immediate. PROCEDURE: Informed written consent was obtained from the patient after a thorough discussion of the procedural risks, benefits and alternatives. All questions were addressed. Maximal Sterile Barrier Technique was utilized including caps, mask, sterile gowns, sterile gloves, sterile drape, hand hygiene and skin antiseptic. A timeout was performed prior to the initiation of the procedure. A planning axial CT scan was performed. The low-attenuation region in the spleen was identified. A skin entry site was selected and marked. The skin was sterilely prepped and draped in  the standard fashion using chlorhexidine  skin prep. Local anesthesia was attained by infiltration with 1% lidocaine . A small dermatotomy was made. Under intermittent CT guidance, an 18 gauge trocar needle was advanced into the fluid collection. Aspiration yields foul-smelling thin reddish brown fluid. It is unclear if this represents liquified hematoma or simple abscess. Regardless, the foul small suggests infection and therefore drain placement will be performed. A 0.035 wire was coiled in the collection. The trocar needle was removed. The percutaneous tract was dilated to 12 French. A 12 French all-purpose drainage catheter was advanced over the wire and formed. Aspiration yields 90 mL of the brownish red foul-smelling fluid. Samples were sent for Gram stain and culture. Follow-up CT imaging demonstrates a well-positioned drainage catheter with no evidence of hemorrhage. The pleura remains cephalad to the drain. No evidence of transgression. IMPRESSION: Successful placement of  a 12 French drainage catheter into the splenic fluid collection yielding 90 mL of reddish brown foul-smelling fluid. Samples were sent for Gram stain and culture. PLAN: Maintain drain to JP bulb suction. Do not flush. Recommend removal of drainage catheter as soon as possible (once output is scant for 48 hours). Electronically Signed   By: Wilkie Lent M.D.   On: 01/26/2024 12:55     Assessment/Plan: 84 y.o. 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   Enterococcus bacteremia -with sepsis  source could be AV FISTULA VS GUS Pt was on ceftriaxone  and vanco- changed to ampicillin  adjusted to Dialysis. 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

## 2024-01-28 NOTE — Progress Notes (Signed)
 [facility] CLINIC CARDIOLOGY PROGRESS NOTE   Patient ID: Jeffery Joseph MRN: [id number] DOB/AGE: 85-Feb-1942 85 y.o.  Admit date: 01/22/2024 Referring Physician Dr. Madison Peaches Primary Physician Epifanio Alm SQUIBB, MD  Primary Cardiologist Dr. Florencio Reason for Consultation AoCHF  HPI: LAZLO TUNNEY is a 84 y.o. 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 Cardiology was consulted for further evaluation.   Interval History:  - Patient seen and examined this morning, resting comfortably in hospital bed.  - Remains on levo now at 15 mcg.  - Feels his breathing is overall stable.  Denies any chest discomfort or palpitations. HR overall stable. Endorses some nausea.  Review of systems complete and found to be negative unless listed above   Vitals:   01/28/24 0600 01/28/24 0615 01/28/24 0630 01/28/24 0645  BP: (!) 110/43 (!) 112/45 (!) 119/44 (!) 117/43  Pulse: (!) 112 (!) 112 (!) 111 (!) 111  Resp: 17 17 17 18   Temp:      TempSrc:      SpO2: 97% 96% 96% 95%  Weight:      Height:         Intake/Output Summary (Last 24 hours) at 01/28/2024 0736 Last data filed at 01/28/2024 0700 Gross per 24 hour  Intake 1097.77 ml  Output 1120 ml  Net -22.23 ml     PHYSICAL EXAM General: Chronically ill-appearing male, well nourished, in no acute distress. HEENT: Normocephalic and atraumatic. Neck: No JVD.  Lungs: Normal respiratory effort on room air. Clear bilaterally to auscultation. No wheezes, crackles, rhonchi.  Heart: HRR, elevated rate. Normal S1 and S2 without gallops or murmurs. Radial & DP pulses 2+ bilaterally. Abdomen: Non-distended appearing.  Msk: Normal strength and tone for age. Extremities: No clubbing, cyanosis or edema.   Neuro: Alert and oriented X 3. Psych: Mood appropriate, affect  congruent.    LABS: Basic Metabolic Panel: Recent Labs    01/27/24 0505 01/28/24 0425  NA 132* 130*  K 3.8 4.0  CL 94* 93*  CO2 27 26  GLUCOSE 135* 146*  BUN 26* 30*  CREATININE 2.97* 3.53*  CALCIUM  8.8* 8.7*  MG 1.9 2.2  PHOS 3.1 3.8   Liver Function Tests: Recent Labs    01/27/24 0505  ALBUMIN  2.6*   No results for input(s): LIPASE, AMYLASE in the last 72 hours. CBC: Recent Labs    01/25/24 1309 01/26/24 0327 01/27/24 0505 01/28/24 0425  WBC 11.3*   < > 8.5 9.0  NEUTROABS 8.8*  --   --   --   HGB 9.1*   < > 8.9* 8.8*  HCT 28.6*   < > 28.5* 28.1*  MCV 77.3*   < > 78.9* 79.2*  PLT 202   < > 227 229   < > = values in this interval not displayed.   Cardiac Enzymes: No results for input(s): CKTOTAL, CKMB, CKMBINDEX, TROPONINIHS in the last 72 hours. BNP: No results for input(s): BNP in the last 72 hours. D-Dimer: No results for input(s): DDIMER in the last 72 hours. Hemoglobin A1C: No results for input(s): HGBA1C in the last 72 hours.  Fasting Lipid Panel: Recent Labs    01/27/24 0505  CHOL 82  HDL 23*  LDLCALC 42  TRIG 79  CHOLHDL 3.5   Thyroid Function Tests: No results for input(s): TSH, T4TOTAL, T3FREE,  THYROIDAB in the last 72 hours.  Invalid input(s): FREET3 Anemia Panel: No results for input(s): VITAMINB12, FOLATE, FERRITIN, TIBC, IRON, RETICCTPCT in the last 72 hours.   US  Carotid Bilateral Result Date: 01/28/2024 EXAM: US  CAROTID DUPLEX 01/27/2024 05:06:51 PM TECHNIQUE: Real-time grayscale, color flow and spectral Doppler sonographic images were obtained of the extracranial carotid system using a linear transducer. COMPARISON: None available CLINICAL HISTORY: [id number] Stroke (cerebrum) (HCC) [id number] Stroke (cerebrum) (HCC) Hypertension, diabetes. FINDINGS: RIGHT: Common carotid artery: 96 cm/s. Mild intimal thickening through the right common carotid artery with some eccentric calcified plaque. Internal  carotid artery: 100 cm/s. Partial calcified plaque in the bulb and proximal ICA resulting in some areas of distal acoustic shadowing. Normal waveforms and color Doppler signal in visualized segments. External carotid artery: 72 cm/s Right ICA/CCA ratio: 1.0 Plaque: Moderate, Calcified. Vertebral artery: Antegrade LEFT: Common carotid artery: 158 cm/s. Mild tortuosity of the common carotid. Partially calcified plaque throughout the length of the common carotid without high-grade stenosis. Internal carotid artery: 55 cm/s. Mild partial calcified plaque in the bulb without high-grade stenosis. Focal area of distal acoustic shadowing in the proximal ICA. Normal color Doppler signal throughout. External carotid artery: 79 cm/s LEFT ICA/CCA ratio: 0.3 Plaque: Moderate, Partially calcified. Vertebral artery: Antegrade IMPRESSION: 1. No hemodynamically significant stenosis (greater than 50%) within the extracranial internal carotid arteries. 2. Mild intimal thickening and eccentric calcified plaque in the right common carotid artery with partial calcified plaque in the right bulb and proximal ICA. 3. Partially calcified plaque throughout the left common carotid artery and mild partial calcified plaque in the left bulb, without high-grade stenosis. 4. Antegrade flow within the bilateral vertebral arteries. Electronically signed by: Katheleen Faes MD 01/28/2024 07:18 AM EST RP Workstation: [id number]   MR ANGIO HEAD WO CONTRAST Result Date: 01/28/2024 CLINICAL DATA:  Follow-up examination for acute stroke. EXAM: MRA HEAD WITHOUT CONTRAST TECHNIQUE: Angiographic images of the Circle of Willis were acquired using MRA technique without intravenous contrast. COMPARISON:  Comparison made with prior brain MRI from 01/25/2024. FINDINGS: Anterior circulation: Examination degraded by motion artifact. Both internal carotid arteries are patent through the siphons without convincing stenosis or other abnormality. Apparent irregularity  and narrowing about the supraclinoid right ICA on MIP reconstructions appears to be most consistent with artifact on source time-of-flight images (series 1015, image 11). A1 segments patent bilaterally. Normal anterior communicating artery complex. Both ACAs are patent to their distal aspects without visible stenosis. No M1 stenosis or occlusion. No proximal MCA branch occlusion or high-grade stenosis. Distal MCA branches are perfused and symmetric. Posterior circulation: Right vertebral artery dominant and widely patent. Diminutive left vertebral artery patent as well without visible stenosis. Right PICA patent. Left PICA not seen. Basilar patent without stenosis. Superior cerebellar and posterior cerebral arteries patent bilaterally. Anatomic variants: Dominant right vertebral artery. Other: No intracranial aneurysm. IMPRESSION: Negative intracranial MRA. No large vessel occlusion. No hemodynamically significant or correctable stenosis. Electronically Signed   By: Morene Hoard M.D.   On: 01/28/2024 00:51   DG Chest Port 1 View Result Date: 01/27/2024 CLINICAL DATA:  Central line EXAM: PORTABLE CHEST 1 VIEW COMPARISON:  01/23/2024, 01/22/2024, 07/19/2023 FINDINGS: Left IJ central venous catheter tip overlies the brachiocephalic confluence. Cardiomegaly with central vascular congestion. Worsening airspace disease at the left lung base compared to prior. No evidence of a pneumothorax. Left axillary stent. IMPRESSION: 1. Left IJ central venous catheter tip overlies the brachiocephalic confluence. No pneumothorax. 2. Cardiomegaly with central vascular congestion and mild edema. Worsening  airspace disease at the left lung base, atelectasis versus pneumonia. Electronically Signed   By: Luke Bun M.D.   On: 01/27/2024 15:29   CT GUIDED PERITONEAL/RETROPERITONEAL FLUID DRAIN BY PERC CATH Result Date: 01/26/2024 INDICATION: 84 year old male with sepsis and new low-attenuation fluid collection versus  liquified infarct in the spleen. He presents for CT-guided aspiration with possible drain placement. EXAM: CT-guided drain placement TECHNIQUE: Multidetector CT imaging of the abdomen was performed following the standard protocol without IV contrast. RADIATION DOSE REDUCTION: This exam was performed according to the departmental dose-optimization program which includes automated exposure control, adjustment of the mA and/or kV according to patient size and/or use of iterative reconstruction technique. MEDICATIONS: The patient is currently admitted to the hospital and receiving intravenous antibiotics. The antibiotics were administered within an appropriate time frame prior to the initiation of the procedure. ANESTHESIA/SEDATION: Moderate (conscious) sedation was employed during this procedure. A total of Versed  0.5 mg and Fentanyl  25 mcg was administered intravenously by the radiology nurse. Total intra-service moderate Sedation Time: 10 minutes. The patient's level of consciousness and vital signs were monitored continuously by radiology nursing throughout the procedure under my direct supervision. COMPLICATIONS: None immediate. PROCEDURE: Informed written consent was obtained from the patient after a thorough discussion of the procedural risks, benefits and alternatives. All questions were addressed. Maximal Sterile Barrier Technique was utilized including caps, mask, sterile gowns, sterile gloves, sterile drape, hand hygiene and skin antiseptic. A timeout was performed prior to the initiation of the procedure. A planning axial CT scan was performed. The low-attenuation region in the spleen was identified. A skin entry site was selected and marked. The skin was sterilely prepped and draped in the standard fashion using chlorhexidine  skin prep. Local anesthesia was attained by infiltration with 1% lidocaine . A small dermatotomy was made. Under intermittent CT guidance, an 18 gauge trocar needle was advanced into  the fluid collection. Aspiration yields foul-smelling thin reddish brown fluid. It is unclear if this represents liquified hematoma or simple abscess. Regardless, the foul small suggests infection and therefore drain placement will be performed. A 0.035 wire was coiled in the collection. The trocar needle was removed. The percutaneous tract was dilated to 12 French. A 12 French all-purpose drainage catheter was advanced over the wire and formed. Aspiration yields 90 mL of the brownish red foul-smelling fluid. Samples were sent for Gram stain and culture. Follow-up CT imaging demonstrates a well-positioned drainage catheter with no evidence of hemorrhage. The pleura remains cephalad to the drain. No evidence of transgression. IMPRESSION: Successful placement of a 12 French drainage catheter into the splenic fluid collection yielding 90 mL of reddish brown foul-smelling fluid. Samples were sent for Gram stain and culture. PLAN: Maintain drain to JP bulb suction. Do not flush. Recommend removal of drainage catheter as soon as possible (once output is scant for 48 hours). Electronically Signed   By: Wilkie Lent M.D.   On: 01/26/2024 12:55     ECHO 07/2023: 1. Left ventricular ejection fraction, by estimation, is 55 to 60%. The left ventricle has normal function. The left ventricle has no regional wall motion abnormalities. The left ventricular internal cavity size was mildly dilated. There is moderate concentric left ventricular hypertrophy. Left ventricular diastolic parameters are consistent with Grade III diastolic dysfunction  (restrictive).   2. Right ventricular systolic function is normal. The right ventricular  size is normal.   3. Left atrial size was moderately dilated.   4. Right atrial size was mild to moderately dilated.  5. The mitral valve is grossly normal. Trivial mitral valve regurgitation.   6. Tricuspid valve regurgitation is mild to moderate.   7. The aortic valve is grossly  normal. Aortic valve regurgitation is  mild. Mild aortic valve stenosis.    TELEMETRY (personally reviewed): Atrial flutter rate 110s  EKG (personally reviewed): Atrial flutter rate 111 bpm, PVCs  DATA reviewed by me 01/28/24: last 24h vitals tele labs imaging I/O, hospitalist progress note, PCCM notes  Principal Problem:   Chest pain Active Problems:   Chronic atrial fibrillation (HCC)   Type 2 diabetes mellitus with chronic kidney disease, without long-term current use of insulin  (HCC)   BPH (benign prostatic hyperplasia)   End-stage renal disease on hemodialysis (HCC)   Influenza   Pressure injury of skin   Bacteremia   Splenic abscess   Bacteremia due to Enterococcus    ASSESSMENT AND PLAN: MCCORMICK MACON is a 84 y.o. 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 Cardiology was consulted for further evaluation.   # Chronic atrial flutter # Acute on chronic HFpEF # ESRD on HD # Anemia Patient presented with worsening shortness of breath and chest tightness.  EKG without acute ischemic changes but did demonstrate atrial flutter which he has a known history of.  Hemoglobin was 4.5 on admission and he received transfusion, up to 9.1 this a.m.  Started on IV heparin . - Heparin  remains on hold until MRA brain and carotid US  are completed and neurology has given ok to resume.  - MRI brain revealed acute stroke, CTA suggestive of splenic infarct. S/p IR drain 01/26/24.  Blood cultures positive for Enterococcus faecalis.  ID recommending TEE, this was scheduled for yesterday however given his hypotension was cancelled. Requiring more levo today so will defer and reconsider next week. - Suspect troponin secondary to demand ischemia.  - Continue amiodarone  100 mg daily.  Dose was decreased at  outpatient appointment due to borderline slow heart rate.  Could consider transitioning to IV infusions however rates have remained stable and he appears relatively asymptomatic. - Continue atorvastatin  80 mg daily. - HD as per nephrology, appreciate their recommendations.  This patient's case was discussed and created with Dr. Florencio and he is in agreement.  Signed:  Danita Bloch, PA-C  01/28/2024, 7:36 AM [facility] Cardiology

## 2024-01-28 NOTE — Plan of Care (Signed)
" °  Problem: Education: Goal: Ability to describe self-care measures that may prevent or decrease complications (Diabetes Survival Skills Education) will improve Outcome: Progressing Goal: Individualized Educational Video(s) Outcome: Progressing   Problem: Coping: Goal: Ability to adjust to condition or change in health will improve Outcome: Progressing   Problem: Fluid Volume: Goal: Ability to maintain a balanced intake and output will improve Outcome: Progressing   Problem: Health Behavior/Discharge Planning: Goal: Ability to identify and utilize available resources and services will improve Outcome: Progressing Goal: Ability to manage health-related needs will improve Outcome: Progressing   Problem: Nutritional: Goal: Maintenance of adequate nutrition will improve Outcome: Progressing Goal: Progress toward achieving an optimal weight will improve Outcome: Progressing   Problem: Skin Integrity: Goal: Risk for impaired skin integrity will decrease Outcome: Progressing   Problem: Tissue Perfusion: Goal: Adequacy of tissue perfusion will improve Outcome: Progressing   Problem: Education: Goal: Knowledge of General Education information will improve Description: Including pain rating scale, medication(s)/side effects and non-pharmacologic comfort measures Outcome: Progressing   Problem: Health Behavior/Discharge Planning: Goal: Ability to manage health-related needs will improve Outcome: Progressing   Problem: Clinical Measurements: Goal: Ability to maintain clinical measurements within normal limits will improve Outcome: Progressing Goal: Will remain free from infection Outcome: Progressing Goal: Diagnostic test results will improve Outcome: Progressing Goal: Respiratory complications will improve Outcome: Progressing Goal: Cardiovascular complication will be avoided Outcome: Progressing   Problem: Activity: Goal: Risk for activity intolerance will  decrease Outcome: Progressing   Problem: Nutrition: Goal: Adequate nutrition will be maintained Outcome: Progressing   Problem: Elimination: Goal: Will not experience complications related to bowel motility Outcome: Progressing Goal: Will not experience complications related to urinary retention Outcome: Progressing   Problem: Pain Managment: Goal: General experience of comfort will improve and/or be controlled Outcome: Progressing   "

## 2024-01-29 DIAGNOSIS — N186 End stage renal disease: Secondary | ICD-10-CM | POA: Diagnosis not present

## 2024-01-29 DIAGNOSIS — B952 Enterococcus as the cause of diseases classified elsewhere: Secondary | ICD-10-CM | POA: Diagnosis not present

## 2024-01-29 DIAGNOSIS — R7881 Bacteremia: Secondary | ICD-10-CM | POA: Diagnosis not present

## 2024-01-29 DIAGNOSIS — D733 Abscess of spleen: Secondary | ICD-10-CM | POA: Diagnosis not present

## 2024-01-29 LAB — CBC
HCT: 26.3 % — ABNORMAL LOW (ref 39.0–52.0)
Hemoglobin: 8.1 g/dL — ABNORMAL LOW (ref 13.0–17.0)
MCH: 24.5 pg — ABNORMAL LOW (ref 26.0–34.0)
MCHC: 30.8 g/dL (ref 30.0–36.0)
MCV: 79.5 fL — ABNORMAL LOW (ref 80.0–100.0)
Platelets: 214 K/uL (ref 150–400)
RBC: 3.31 MIL/uL — ABNORMAL LOW (ref 4.22–5.81)
RDW: 19.2 % — ABNORMAL HIGH (ref 11.5–15.5)
WBC: 8.7 K/uL (ref 4.0–10.5)
nRBC: 0 % (ref 0.0–0.2)

## 2024-01-29 LAB — BASIC METABOLIC PANEL WITH GFR
Anion gap: 12 (ref 5–15)
BUN: 26 mg/dL — ABNORMAL HIGH (ref 8–23)
CO2: 25 mmol/L (ref 22–32)
Calcium: 8.7 mg/dL — ABNORMAL LOW (ref 8.9–10.3)
Chloride: 95 mmol/L — ABNORMAL LOW (ref 98–111)
Creatinine, Ser: 2.98 mg/dL — ABNORMAL HIGH (ref 0.61–1.24)
GFR, Estimated: 20 mL/min — ABNORMAL LOW
Glucose, Bld: 164 mg/dL — ABNORMAL HIGH (ref 70–99)
Potassium: 4.2 mmol/L (ref 3.5–5.1)
Sodium: 132 mmol/L — ABNORMAL LOW (ref 135–145)

## 2024-01-29 LAB — HEPARIN LEVEL (UNFRACTIONATED)
Heparin Unfractionated: 0.33 [IU]/mL (ref 0.30–0.70)
Heparin Unfractionated: 0.29 [IU]/mL — ABNORMAL LOW (ref 0.30–0.70)

## 2024-01-29 LAB — LACTIC ACID, PLASMA: Lactic Acid, Venous: 1 mmol/L (ref 0.5–1.9)

## 2024-01-29 LAB — COOXEMETRY PANEL: Total oxygen content: 95.3 %

## 2024-01-29 LAB — GLUCOSE, CAPILLARY
Glucose-Capillary: 144 mg/dL — ABNORMAL HIGH (ref 70–99)
Glucose-Capillary: 213 mg/dL — ABNORMAL HIGH (ref 70–99)
Glucose-Capillary: 188 mg/dL — ABNORMAL HIGH (ref 70–99)
Glucose-Capillary: 186 mg/dL — ABNORMAL HIGH (ref 70–99)

## 2024-01-29 LAB — APTT: aPTT: 72 s — ABNORMAL HIGH (ref 24–36)

## 2024-01-29 LAB — MAGNESIUM: Magnesium: 2.2 mg/dL (ref 1.7–2.4)

## 2024-01-29 MED ORDER — HEPARIN (PORCINE) 25000 UT/250ML-% IV SOLN
1650.0000 [IU]/h | INTRAVENOUS | Status: DC
  Administered 2024-01-30: 1300 [IU]/h via INTRAVENOUS
  Administered 2024-01-31: 1600 [IU]/h via INTRAVENOUS
  Administered 2024-01-31: 1650 [IU]/h via INTRAVENOUS
  Filled 2024-01-29 (×3): qty 250

## 2024-01-29 MED ORDER — HEPARIN BOLUS VIA INFUSION
1400.0000 [IU] | Freq: Once | INTRAVENOUS | Status: AC
  Administered 2024-01-29: 1400 [IU] via INTRAVENOUS
  Filled 2024-01-29: qty 1400

## 2024-01-29 MED ORDER — MIDODRINE HCL 5 MG PO TABS
15.0000 mg | ORAL_TABLET | Freq: Three times a day (TID) | ORAL | Status: DC
  Administered 2024-01-29 – 2024-02-03 (×16): 15 mg via ORAL
  Filled 2024-01-29 (×17): qty 3

## 2024-01-29 NOTE — Progress Notes (Signed)
 " [facility]  PROGRESS NOTE   Subjective:   Patient seen at bedside.  Comfortable today.  Had stable dialysis yesterday  Objective:  Vital signs: Blood pressure (!) 101/44, pulse (!) 110, temperature 97.8 F (36.6 C), temperature source Oral, resp. rate (!) 21, height 6' 5 (1.956 m), weight 102.6 kg, SpO2 100%.  Intake/Output Summary (Last 24 hours) at 01/29/2024 1405 Last data filed at 01/29/2024 1200 Gross per 24 hour  Intake 1452.71 ml  Output 2310 ml  Net -857.29 ml   Filed Weights   01/28/24 1314 01/28/24 1645 01/29/24 0448  Weight: 107.4 kg 106.3 kg 102.6 kg     Physical Exam: General:  No acute distress  Head:  Normocephalic, atraumatic. Moist oral mucosal membranes  Eyes:  Anicteric  Neck:  Supple  Lungs:   Clear to auscultation, normal effort  Heart:  S1S2 no rubs  Abdomen:   Soft, nontender, bowel sounds present  Extremities:  peripheral edema.  Neurologic:  Awake, alert, following commands  Skin:  No lesions  Access:     Basic Metabolic Panel: Recent Labs  Lab 01/24/24 1554 01/25/24 0330 01/26/24 0327 01/27/24 0505 01/28/24 0425 01/29/24 0441  NA 132* 130* 128* 132* 130* 132*  K 3.6 3.3* 3.5 3.8 4.0 4.2  CL 94* 93* 93* 94* 93* 95*  CO2 27 27 26 27 26 25   GLUCOSE 173* 147* 137* 135* 146* 164*  BUN 31* 35* 39* 26* 30* 26*  CREATININE 3.37* 3.62* 3.77* 2.97* 3.53* 2.98*  CALCIUM  8.3* 8.3* 8.3* 8.8* 8.7* 8.7*  MG  --  1.9 1.8 1.9 2.2 2.2  PHOS 2.5 2.7 3.2 3.1 3.8  --    GFR: Estimated Creatinine Clearance: 23.3 mL/min (A) (by C-G formula based on SCr of 2.98 mg/dL (H)).  Liver Function Tests: Recent Labs  Lab 01/22/24 1745 01/24/24 0317 01/24/24 1554 01/25/24 0330 01/27/24 0505  AST 73* 34  --  22  --   ALT 70* 48*  --  33  --   ALKPHOS 86 66  --  64  --   BILITOT 0.5 0.9  --  0.7  --   PROT 7.6 6.7  --  6.5  --   ALBUMIN  3.1* 2.7* 2.7* 2.6* 2.6*   No results for input(s): LIPASE, AMYLASE in the last 168 hours. No  results for input(s): AMMONIA in the last 168 hours.  CBC: Recent Labs  Lab 01/25/24 1309 01/26/24 0327 01/27/24 0505 01/28/24 0425 01/29/24 0441  WBC 11.3* 10.5 8.5 9.0 8.7  NEUTROABS 8.8*  --   --   --   --   HGB 9.1* 8.6* 8.9* 8.8* 8.1*  HCT 28.6* 27.1* 28.5* 28.1* 26.3*  MCV 77.3* 78.1* 78.9* 79.2* 79.5*  PLT 202 193 227 229 214     HbA1C: Hgb A1c MFr Bld  Date/Time Value Ref Range Status  01/23/2024 04:30 AM 5.9 (H) 4.8 - 5.6 % Final    Comment:    (NOTE) Diagnosis of Diabetes The following HbA1c ranges recommended by the American Diabetes Association (ADA) may be used as an aid in the diagnosis of diabetes mellitus.  Hemoglobin             Suggested A1C NGSP%              Diagnosis  <5.7                   Non Diabetic  5.7-6.4  Pre-Diabetic  >6.4                   Diabetic  <7.0                   Glycemic control for                       adults with diabetes.    01/23/2024 03:23 AM 5.7 (H) 4.8 - 5.6 % Final    Comment:    (NOTE) Diagnosis of Diabetes The following HbA1c ranges recommended by the American Diabetes Association (ADA) may be used as an aid in the diagnosis of diabetes mellitus.  Hemoglobin             Suggested A1C NGSP%              Diagnosis  <5.7                   Non Diabetic  5.7-6.4                Pre-Diabetic  >6.4                   Diabetic  <7.0                   Glycemic control for                       adults with diabetes.      Urinalysis: No results for input(s): COLORURINE, LABSPEC, PHURINE, GLUCOSEU, HGBUR, BILIRUBINUR, KETONESUR, PROTEINUR, UROBILINOGEN, NITRITE, LEUKOCYTESUR in the last 72 hours.  Invalid input(s): APPERANCEUR    Imaging: US  Carotid Bilateral Result Date: 01/28/2024 EXAM: US  CAROTID DUPLEX 01/27/2024 05:06:51 PM TECHNIQUE: Real-time grayscale, color flow and spectral Doppler sonographic images were obtained of the extracranial carotid system using a  linear transducer. COMPARISON: None available CLINICAL HISTORY: [id number] Stroke (cerebrum) (HCC) [id number] Stroke (cerebrum) (HCC) Hypertension, diabetes. FINDINGS: RIGHT: Common carotid artery: 96 cm/s. Mild intimal thickening through the right common carotid artery with some eccentric calcified plaque. Internal carotid artery: 100 cm/s. Partial calcified plaque in the bulb and proximal ICA resulting in some areas of distal acoustic shadowing. Normal waveforms and color Doppler signal in visualized segments. External carotid artery: 72 cm/s Right ICA/CCA ratio: 1.0 Plaque: Moderate, Calcified. Vertebral artery: Antegrade LEFT: Common carotid artery: 158 cm/s. Mild tortuosity of the common carotid. Partially calcified plaque throughout the length of the common carotid without high-grade stenosis. Internal carotid artery: 55 cm/s. Mild partial calcified plaque in the bulb without high-grade stenosis. Focal area of distal acoustic shadowing in the proximal ICA. Normal color Doppler signal throughout. External carotid artery: 79 cm/s LEFT ICA/CCA ratio: 0.3 Plaque: Moderate, Partially calcified. Vertebral artery: Antegrade IMPRESSION: 1. No hemodynamically significant stenosis (greater than 50%) within the extracranial internal carotid arteries. 2. Mild intimal thickening and eccentric calcified plaque in the right common carotid artery with partial calcified plaque in the right bulb and proximal ICA. 3. Partially calcified plaque throughout the left common carotid artery and mild partial calcified plaque in the left bulb, without high-grade stenosis. 4. Antegrade flow within the bilateral vertebral arteries. Electronically signed by: Katheleen Faes MD 01/28/2024 07:18 AM EST RP Workstation: [id number]   MR ANGIO HEAD WO CONTRAST Result Date: 01/28/2024 CLINICAL DATA:  Follow-up examination for acute stroke. EXAM: MRA HEAD WITHOUT CONTRAST TECHNIQUE: Angiographic images of the Circle of Willis were acquired using MRA  technique without  intravenous contrast. COMPARISON:  Comparison made with prior brain MRI from 01/25/2024. FINDINGS: Anterior circulation: Examination degraded by motion artifact. Both internal carotid arteries are patent through the siphons without convincing stenosis or other abnormality. Apparent irregularity and narrowing about the supraclinoid right ICA on MIP reconstructions appears to be most consistent with artifact on source time-of-flight images (series 1015, image 11). A1 segments patent bilaterally. Normal anterior communicating artery complex. Both ACAs are patent to their distal aspects without visible stenosis. No M1 stenosis or occlusion. No proximal MCA branch occlusion or high-grade stenosis. Distal MCA branches are perfused and symmetric. Posterior circulation: Right vertebral artery dominant and widely patent. Diminutive left vertebral artery patent as well without visible stenosis. Right PICA patent. Left PICA not seen. Basilar patent without stenosis. Superior cerebellar and posterior cerebral arteries patent bilaterally. Anatomic variants: Dominant right vertebral artery. Other: No intracranial aneurysm. IMPRESSION: Negative intracranial MRA. No large vessel occlusion. No hemodynamically significant or correctable stenosis. Electronically Signed   By: Morene Hoard M.D.   On: 01/28/2024 00:51   DG Chest Port 1 View Result Date: 01/27/2024 CLINICAL DATA:  Central line EXAM: PORTABLE CHEST 1 VIEW COMPARISON:  01/23/2024, 01/22/2024, 07/19/2023 FINDINGS: Left IJ central venous catheter tip overlies the brachiocephalic confluence. Cardiomegaly with central vascular congestion. Worsening airspace disease at the left lung base compared to prior. No evidence of a pneumothorax. Left axillary stent. IMPRESSION: 1. Left IJ central venous catheter tip overlies the brachiocephalic confluence. No pneumothorax. 2. Cardiomegaly with central vascular congestion and mild edema. Worsening airspace  disease at the left lung base, atelectasis versus pneumonia. Electronically Signed   By: Luke Bun M.D.   On: 01/27/2024 15:29     Medications:    albumin  human 12.5 g (01/29/24 0916)   ampicillin  (OMNIPEN) IV Stopped (01/29/24 1100)   cefTRIAXone  (ROCEPHIN )  IV 2 g (01/29/24 1105)   dexmedetomidine  (PRECEDEX ) IV infusion Stopped (01/27/24 2149)   heparin  1,200 Units/hr (01/29/24 0917)   norepinephrine  (LEVOPHED ) Adult infusion 12 mcg/min (01/29/24 1232)    amiodarone   100 mg Oral Daily   Chlorhexidine  Gluconate Cloth  6 each Topical Daily   cholecalciferol   5,000 Units Oral Daily   diclofenac  Sodium  2 g Topical QID   docusate sodium   100 mg Oral QHS   donepezil   10 mg Oral QHS   feeding supplement  237 mL Oral BID BM   finasteride   5 mg Oral Daily   hydrocortisone  sod succinate (SOLU-CORTEF ) inj  50 mg Intravenous Q6H   insulin  aspart  0-15 Units Subcutaneous TID WC   insulin  aspart  0-5 Units Subcutaneous QHS   midodrine   10 mg Oral TID WC   pantoprazole  (PROTONIX ) IV  40 mg Intravenous Daily   tamsulosin   0.4 mg Oral Daily    Assessment/ Plan:     84 year old male with history of hypertension, coronary artery disease, atrial fibrillation, diabetes, anemia, anxiety/depression, BPH and end-stage renal disease on hemodialysis Monday Wednesday Friday schedule now admitted with history of chest tightness and shortness of breath.  #1: ESRD: Patient had stable dialysis yesterday with fluid removal.  #2: Anemia: Continue anemia protocol with iron and Epogen  as needed.  #3: Secondary parathyroidism: Will monitor calcium , phosphorus and PTH.  #4: Sepsis: Patient has Enterococcus bacteremia.  Patient also has splenic abscess.  Presently on ampicillin  and Rocephin .  #5: Hypertension: Patient is still on Levophed .  Will need to titrate to off as tolerated.  Labs and medications reviewed. Will continue to follow along with you.  LOS: 6 Pinkey Edman, MD [facility] 1/17/20262:05 PM  "

## 2024-01-29 NOTE — Progress Notes (Signed)
 "  Date of Admission:  01/22/2024     ID: Jeffery Joseph is a 84 y.o. male Principal Problem:   Chest pain Active Problems:   Chronic atrial fibrillation (HCC)   Type 2 diabetes mellitus with chronic kidney disease, without long-term current use of insulin  (HCC)   BPH (benign prostatic hyperplasia)   End-stage renal disease on hemodialysis (HCC)   Influenza   Pressure injury of skin   Bacteremia   Splenic abscess   Bacteremia due to Enterococcus    Subjective: Patient has no specific complaints BP still low On levo  Medications:   amiodarone   100 mg Oral Daily   Chlorhexidine  Gluconate Cloth  6 each Topical Daily   cholecalciferol   5,000 Units Oral Daily   diclofenac  Sodium  2 g Topical QID   docusate sodium   100 mg Oral QHS   donepezil   10 mg Oral QHS   feeding supplement  237 mL Oral BID BM   finasteride   5 mg Oral Daily   hydrocortisone  sod succinate (SOLU-CORTEF ) inj  50 mg Intravenous Q6H   insulin  aspart  0-15 Units Subcutaneous TID WC   insulin  aspart  0-5 Units Subcutaneous QHS   midodrine   10 mg Oral TID WC   pantoprazole  (PROTONIX ) IV  40 mg Intravenous Daily   tamsulosin   0.4 mg Oral Daily    Objective: Vital signs in last 24 hours: Patient Vitals for the past 24 hrs:  BP Temp Temp src Pulse Resp SpO2 Weight  01/29/24 1500 (!) 106/38 -- -- (!) 105 (!) 21 100 % --  01/29/24 1400 (!) 103/46 -- -- (!) 104 (!) 25 100 % --  01/29/24 1300 (!) 101/44 -- -- (!) 110 (!) 21 100 % --  01/29/24 1230 (!) 99/40 -- -- (!) 108 (!) 25 100 % --  01/29/24 1200 (!) 102/40 97.8 F (36.6 C) Oral (!) 108 (!) 21 100 % --  01/29/24 1130 (!) 107/44 -- -- (!) 107 (!) 23 100 % --  01/29/24 1112 (!) 97/40 -- -- 100 16 99 % --  01/29/24 1100 (!) 91/38 -- -- 91 16 99 % --  01/29/24 1030 (!) 102/38 -- -- (!) 102 (!) 22 100 % --  01/29/24 1000 (!) 91/39 -- -- 86 15 100 % --  01/29/24 0900 (!) 106/45 -- -- 95 19 100 % --  01/29/24 0800 (!) 107/49 -- -- (!) 108 (!) 25 98 % --  01/29/24  0730 (!) 116/49 -- -- (!) 107 19 100 % --  01/29/24 0711 (!) 99/56 98.1 F (36.7 C) Oral (!) 108 20 100 % --  01/29/24 0600 (!) 111/49 -- -- (!) 107 14 100 % --  01/29/24 0530 (!) 111/47 -- -- (!) 107 17 100 % --  01/29/24 0500 (!) 111/44 -- -- (!) 105 15 100 % --  01/29/24 0448 -- -- -- (!) 104 15 98 % 102.6 kg  01/29/24 0430 (!) 105/42 -- -- (!) 106 15 100 % --  01/29/24 0400 (!) 108/47 -- -- (!) 108 15 100 % --  01/29/24 0330 (!) 118/45 -- -- (!) 108 (!) 23 100 % --  01/29/24 0300 (!) 114/47 -- -- (!) 108 (!) 23 100 % --  01/29/24 0230 (!) 117/47 -- -- (!) 107 20 100 % --  01/29/24 0200 (!) 110/44 97.9 F (36.6 C) Axillary (!) 104 16 100 % --  01/29/24 0130 (!) 114/45 -- -- (!) 106 15 100 % --  01/29/24  0100 (!) 105/40 -- -- (!) 108 14 100 % --  01/29/24 0030 (!) 116/45 -- -- (!) 108 (!) 25 100 % --  01/29/24 0000 (!) 115/51 -- -- (!) 110 (!) 23 100 % --  01/28/24 2330 (!) 109/45 -- -- (!) 109 (!) 23 100 % --  01/28/24 2300 (!) 114/45 -- -- (!) 110 20 100 % --  01/28/24 2230 (!) 109/43 -- -- (!) 110 20 100 % --  01/28/24 2205 -- -- -- (!) 110 (!) 21 100 % --  01/28/24 2200 (!) 116/46 -- -- (!) 112 16 100 % --  01/28/24 2130 (!) 113/45 -- -- (!) 111 18 100 % --  01/28/24 2100 (!) 111/42 -- -- (!) 114 18 100 % --  01/28/24 2030 (!) 111/45 -- -- (!) 114 18 100 % --  01/28/24 2000 (!) 116/49 -- -- (!) 114 (!) 22 99 % --  01/28/24 1945 (!) 118/48 -- -- (!) 115 (!) 25 98 % --  01/28/24 1930 (!) 119/47 98.3 F (36.8 C) Oral (!) 116 (!) 22 100 % --  01/28/24 1800 (!) 119/39 -- -- (!) 114 (!) 28 98 % --  01/28/24 1700 (!) 119/42 -- -- (!) 114 (!) 24 100 % --  01/28/24 1645 (!) 120/45 97.7 F (36.5 C) Oral (!) 111 (!) 22 100 % 106.3 kg  01/28/24 1630 (!) 122/46 -- -- (!) 113 (!) 22 95 % --  01/28/24 1615 (!) 122/46 -- -- (!) 109 (!) 26 100 % --       PHYSICAL EXAM:  General: Alert, cooperative, no distress,.  Lungs: BiLateral air entry Heart: Tachycardic  abdomen: Soft, left  upper quadrant drain- Extremities: atraumatic, no cyanosis. No edema. No clubbing Skin: No rashes or lesions. Or bruising Lymph: Cervical, supraclavicular normal. Neurologic: Grossly non-focal  Lab Results    Latest Ref Rng & Units 01/29/2024    4:41 AM 01/28/2024    4:25 AM 01/27/2024    5:05 AM  CBC  WBC 4.0 - 10.5 K/uL 8.7  9.0  8.5   Hemoglobin 13.0 - 17.0 g/dL 8.1  8.8  8.9   Hematocrit 39.0 - 52.0 % 26.3  28.1  28.5   Platelets 150 - 400 K/uL 214  229  227        Latest Ref Rng & Units 01/29/2024    4:41 AM 01/28/2024    4:25 AM 01/27/2024    5:05 AM  CMP  Glucose 70 - 99 mg/dL 835  853  864   BUN 8 - 23 mg/dL 26  30  26    Creatinine 0.61 - 1.24 mg/dL 7.01  6.46  7.02   Sodium 135 - 145 mmol/L 132  130  132   Potassium 3.5 - 5.1 mmol/L 4.2  4.0  3.8   Chloride 98 - 111 mmol/L 95  93  94   CO2 22 - 32 mmol/L 25  26  27    Calcium  8.9 - 10.3 mg/dL 8.7  8.7  8.8       Microbiology: 01/24/24 [facility] Enterococcus 4/4 Repeat blood culture  01/27/2023 Splenic abscess culture Klebsiella pneumoniae and enterococcus fecalis  Studies/Results: US  Carotid Bilateral Result Date: 01/28/2024 EXAM: US  CAROTID DUPLEX 01/27/2024 05:06:51 PM TECHNIQUE: Real-time grayscale, color flow and spectral Doppler sonographic images were obtained of the extracranial carotid system using a linear transducer. COMPARISON: None available CLINICAL HISTORY: [id number] Stroke (cerebrum) (HCC) 801713 Stroke (cerebrum) (HCC) Hypertension, diabetes. FINDINGS: RIGHT: Common carotid artery: 41  cm/s. Mild intimal thickening through the right common carotid artery with some eccentric calcified plaque. Internal carotid artery: 100 cm/s. Partial calcified plaque in the bulb and proximal ICA resulting in some areas of distal acoustic shadowing. Normal waveforms and color Doppler signal in visualized segments. External carotid artery: 72 cm/s Right ICA/CCA ratio: 1.0 Plaque: Moderate, Calcified. Vertebral artery: Antegrade LEFT:  Common carotid artery: 158 cm/s. Mild tortuosity of the common carotid. Partially calcified plaque throughout the length of the common carotid without high-grade stenosis. Internal carotid artery: 55 cm/s. Mild partial calcified plaque in the bulb without high-grade stenosis. Focal area of distal acoustic shadowing in the proximal ICA. Normal color Doppler signal throughout. External carotid artery: 79 cm/s LEFT ICA/CCA ratio: 0.3 Plaque: Moderate, Partially calcified. Vertebral artery: Antegrade IMPRESSION: 1. No hemodynamically significant stenosis (greater than 50%) within the extracranial internal carotid arteries. 2. Mild intimal thickening and eccentric calcified plaque in the right common carotid artery with partial calcified plaque in the right bulb and proximal ICA. 3. Partially calcified plaque throughout the left common carotid artery and mild partial calcified plaque in the left bulb, without high-grade stenosis. 4. Antegrade flow within the bilateral vertebral arteries. Electronically signed by: Katheleen Faes MD 01/28/2024 07:18 AM EST RP Workstation: [id number]   MR ANGIO HEAD WO CONTRAST Result Date: 01/28/2024 CLINICAL DATA:  Follow-up examination for acute stroke. EXAM: MRA HEAD WITHOUT CONTRAST TECHNIQUE: Angiographic images of the Circle of Willis were acquired using MRA technique without intravenous contrast. COMPARISON:  Comparison made with prior brain MRI from 01/25/2024. FINDINGS: Anterior circulation: Examination degraded by motion artifact. Both internal carotid arteries are patent through the siphons without convincing stenosis or other abnormality. Apparent irregularity and narrowing about the supraclinoid right ICA on MIP reconstructions appears to be most consistent with artifact on source time-of-flight images (series 1015, image 11). A1 segments patent bilaterally. Normal anterior communicating artery complex. Both ACAs are patent to their distal aspects without visible stenosis.  No M1 stenosis or occlusion. No proximal MCA branch occlusion or high-grade stenosis. Distal MCA branches are perfused and symmetric. Posterior circulation: Right vertebral artery dominant and widely patent. Diminutive left vertebral artery patent as well without visible stenosis. Right PICA patent. Left PICA not seen. Basilar patent without stenosis. Superior cerebellar and posterior cerebral arteries patent bilaterally. Anatomic variants: Dominant right vertebral artery. Other: No intracranial aneurysm. IMPRESSION: Negative intracranial MRA. No large vessel occlusion. No hemodynamically significant or correctable stenosis. Electronically Signed   By: Morene Hoard M.D.   On: 01/28/2024 00:51     Assessment/Plan: 84 y.o. 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   Enterococcus bacteremia -with sepsis  source could be AV FISTULA  On ceftriaxone  and ampicillin .  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

## 2024-01-29 NOTE — Consult Note (Signed)
 PHARMACY - ANTICOAGULATION CONSULT NOTE  Pharmacy Consult for IV Heparin  Indication: atrial fibrillation  Allergies[1]  Patient Measurements: Height: 6' 5 (195.6 cm) Weight: 106.3 kg (234 lb 5.6 oz) IBW/kg (Calculated) : 89.1 HEPARIN  DW (KG): 96.9  Labs: Recent Labs    01/26/24 0327 01/26/24 0949 01/27/24 0505 01/28/24 0425 01/28/24 1400 01/29/24 0022  HGB 8.6*  --  8.9* 8.8*  --   --   HCT 27.1*  --  28.5* 28.1*  --   --   PLT 193  --  227 229  --   --   APTT  --   --   --   --  50* 72*  LABPROT  --  19.8*  --   --  18.5*  --   INR  --  1.6*  --   --  1.5*  --   HEPARINUNFRC  --   --   --   --  0.18*  --   CREATININE 3.77*  --  2.97* 3.53*  --   --     Estimated Creatinine Clearance: 19.6 mL/min (A) (by C-G formula based on SCr of 3.53 mg/dL (H)).   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 Source thought to be from splenic abscess now s/p drain placement. Patient suffered acute stroke as seen on MRI this admission. Pharmacy consulted to dose heparin  for Afib.  Patient's last dose of apixaban  was on 01/23/24. Suspect effect on anti-Xa should be washed out at this point but with dialysis may take longer.  Re-check baseline aPTT, INR and heparin  level. CBC notable for chronic anemia.  1/17 0022: aPTT 72, therapeutic x 1  Goal of Therapy:  Heparin  level 0.3-0.7 units/ml aPTT 66 - 102 seconds Monitor platelets by anticoagulation protocol: Yes   Plan:  --aPTT therapeutic x 1 --Continue heparin  gtt rate  at 1150 units/hr  --Transition to HL monitor given HL now correlating --Check confirmatory HL in 8 hours  --Daily CBC per protocol while on IV heparin   Lum VEAR Mania, PharmD, BCPS 01/29/2024,1:07 AM     [1] No Known Allergies

## 2024-01-29 NOTE — Plan of Care (Signed)

## 2024-01-29 NOTE — Consult Note (Signed)
 PHARMACY - ANTICOAGULATION CONSULT NOTE  Pharmacy Consult for IV Heparin  Indication: atrial fibrillation  Allergies[1]  Patient Measurements: Height: 6' 5 (195.6 cm) Weight: 102.6 kg (226 lb 3.1 oz) IBW/kg (Calculated) : 89.1 HEPARIN  DW (KG): 96.9  Labs: Recent Labs    01/27/24 0505 01/28/24 0425 01/28/24 1400 01/29/24 0022 01/29/24 0441 01/29/24 0746 01/29/24 1605  HGB 8.9* 8.8*  --   --  8.1*  --   --   HCT 28.5* 28.1*  --   --  26.3*  --   --   PLT 227 229  --   --  214  --   --   APTT  --   --  50* 72*  --   --   --   LABPROT  --   --  18.5*  --   --   --   --   INR  --   --  1.5*  --   --   --   --   HEPARINUNFRC  --   --  0.18*  --   --  0.33 0.29*  CREATININE 2.97* 3.53*  --   --  2.98*  --   --     Estimated Creatinine Clearance: 23.3 mL/min (A) (by C-G formula based on SCr of 2.98 mg/dL (H)).   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 Source thought to be from splenic abscess now s/p drain placement. Patient suffered acute stroke as seen on MRI this admission. Pharmacy consulted to dose heparin  for Afib.  Patient's last dose of apixaban  was on 01/23/24. Suspect effect on anti-Xa should be washed out at this point but with dialysis may take longer.  Goal of Therapy:  Heparin  level 0.3-0.7 units/ml Monitor platelets by anticoagulation protocol: Yes   Date Time  Results Comments 1/17 0022 aPTT 7  therapeutic x 1 1/17 0746  HL 0.33  Therapeutic x2 but rate changed to 1150u/h,Hgb/plt  stable. 1/17 1607 HL 0.29 Subtherapeutic, rate 1200u/hr  Plan:  Heparin  level subtherapeutic  Bolus heparin  1400 units IV x 1 dose Increase heparin  infusion rate to 1300 units/hr Recheck heparin  level in 8 hours. CBC daily while on heparin .   Jeannemarie Sawaya Rodriguez-Guzman PharmD, BCPS 01/29/2024 5:51 PM      [1] No Known Allergies

## 2024-01-29 NOTE — Consult Note (Addendum)
 PHARMACY - ANTICOAGULATION CONSULT NOTE  Pharmacy Consult for IV Heparin  Indication: atrial fibrillation  Allergies[1]  Patient Measurements: Height: 6' 5 (195.6 cm) Weight: 102.6 kg (226 lb 3.1 oz) IBW/kg (Calculated) : 89.1 HEPARIN  DW (KG): 96.9  Labs: Recent Labs    01/26/24 0949 01/27/24 0505 01/27/24 0505 01/28/24 0425 01/28/24 1400 01/29/24 0022 01/29/24 0441 01/29/24 0746  HGB  --  8.9*   < > 8.8*  --   --  8.1*  --   HCT  --  28.5*  --  28.1*  --   --  26.3*  --   PLT  --  227  --  229  --   --  214  --   APTT  --   --   --   --  50* 72*  --   --   LABPROT 19.8*  --   --   --  18.5*  --   --   --   INR 1.6*  --   --   --  1.5*  --   --   --   HEPARINUNFRC  --   --   --   --  0.18*  --   --  0.33  CREATININE  --  2.97*  --  3.53*  --   --  2.98*  --    < > = values in this interval not displayed.    Estimated Creatinine Clearance: 23.3 mL/min (A) (by C-G formula based on SCr of 2.98 mg/dL (H)).   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 Source thought to be from splenic abscess now s/p drain placement. Patient suffered acute stroke as seen on MRI this admission. Pharmacy consulted to dose heparin  for Afib.  Patient's last dose of apixaban  was on 01/23/24. Suspect effect on anti-Xa should be washed out at this point but with dialysis may take longer.  1/17 0022: aPTT 72, therapeutic x 1 1/17 0746: HL 0.33   Hgb/plt stable.   Goal of Therapy:  Heparin  level 0.3-0.7 units/ml Monitor platelets by  anticoagulation protocol: Yes   Plan:  Heparin  level is therapeutic but on the lower end of goal. Will increase heparin  infusion at 1200 units/hr. Recheck heparin  level in 8 hours. CBC daily while on heparin .   Cathaleen GORMAN Blanch, PharmD, BCPS 01/29/2024,8:45 AM      [1] No Known Allergies

## 2024-01-29 NOTE — Progress Notes (Signed)
 [facility]    SUBJECTIVE: Patient lying in bed feels reasonably well no pain no shortness of breath no fever chills or sweats still has some generalized weakness   Vitals:   01/29/24 0800 01/29/24 0900 01/29/24 1000 01/29/24 1030  BP: (!) 107/49 (!) 106/45 (!) 91/39 (!) 102/38  Pulse: (!) 108 95 86 (!) 102  Resp: (!) 25 19 15  (!) 22  Temp:      TempSrc:      SpO2: 98% 100% 100% 100%  Weight:      Height:         Intake/Output Summary (Last 24 hours) at 01/29/2024 1038 Last data filed at 01/29/2024 0800 Gross per 24 hour  Intake 1170.46 ml  Output 2310 ml  Net -1139.54 ml      PHYSICAL EXAM  General: Well developed, well nourished, in no acute distress HEENT:  Normocephalic and atramatic Neck:  No JVD.  Lungs: Clear bilaterally to auscultation and percussion. Heart: Irregular irregular. Normal S1 and S2 without gallops or murmurs.  Abdomen: Bowel sounds are positive, abdomen soft and non-tender  Msk:  Back normal, normal gait. Normal strength and tone for age. Extremities: No clubbing, cyanosis or edema.   Neuro: Alert and oriented X 3. Psych:  Good affect, responds appropriately   LABS: Basic Metabolic Panel: Recent Labs    01/27/24 0505 01/28/24 0425 01/29/24 0441  NA 132* 130* 132*  K 3.8 4.0 4.2  CL 94* 93* 95*  CO2 27 26 25   GLUCOSE 135* 146* 164*  BUN 26* 30* 26*  CREATININE 2.97* 3.53* 2.98*  CALCIUM  8.8* 8.7* 8.7*  MG 1.9 2.2 2.2  PHOS 3.1 3.8  --    Liver Function Tests: Recent Labs    01/27/24 0505  ALBUMIN  2.6*   No results for input(s): LIPASE, AMYLASE in the last 72 hours. CBC: Recent Labs    01/28/24 0425 01/29/24 0441  WBC 9.0 8.7  HGB 8.8* 8.1*  HCT 28.1* 26.3*  MCV 79.2* 79.5*  PLT 229 214   Cardiac Enzymes: No results for input(s): CKTOTAL, CKMB, CKMBINDEX, TROPONINI in the last 72 hours. BNP: Invalid input(s): POCBNP D-Dimer: No results for input(s): DDIMER in the last 72 hours. Hemoglobin  A1C: No results for input(s): HGBA1C in the last 72 hours. Fasting Lipid Panel: Recent Labs    01/27/24 0505  CHOL 82  HDL 23*  LDLCALC 42  TRIG 79  CHOLHDL 3.5   Thyroid Function Tests: No results for input(s): TSH, T4TOTAL, T3FREE, THYROIDAB in the last 72 hours.  Invalid input(s): FREET3 Anemia Panel: No results for input(s): VITAMINB12, FOLATE, FERRITIN, TIBC, IRON, RETICCTPCT in the last 72 hours.  US  Carotid Bilateral Result Date: 01/28/2024 EXAM: US  CAROTID DUPLEX 01/27/2024 05:06:51 PM TECHNIQUE: Real-time grayscale, color flow and spectral Doppler sonographic images were obtained of the extracranial carotid system using a linear transducer. COMPARISON: None available CLINICAL HISTORY: [id number] Stroke (cerebrum) (HCC) [id number] Stroke (cerebrum) (HCC) Hypertension, diabetes. FINDINGS: RIGHT: Common carotid artery: 96 cm/s. Mild intimal thickening through the right common carotid artery with some eccentric calcified plaque. Internal carotid artery: 100 cm/s. Partial calcified plaque in the bulb and proximal ICA resulting in some areas of distal acoustic shadowing. Normal waveforms and color Doppler signal in visualized segments. External carotid artery: 72 cm/s Right ICA/CCA ratio: 1.0 Plaque: Moderate, Calcified. Vertebral artery: Antegrade LEFT: Common carotid artery: 158 cm/s. Mild tortuosity of the common carotid. Partially calcified plaque throughout the length of the common carotid without high-grade stenosis. Internal carotid  artery: 55 cm/s. Mild partial calcified plaque in the bulb without high-grade stenosis. Focal area of distal acoustic shadowing in the proximal ICA. Normal color Doppler signal throughout. External carotid artery: 79 cm/s LEFT ICA/CCA ratio: 0.3 Plaque: Moderate, Partially calcified. Vertebral artery: Antegrade IMPRESSION: 1. No hemodynamically significant stenosis (greater than 50%) within the extracranial internal carotid arteries. 2. Mild  intimal thickening and eccentric calcified plaque in the right common carotid artery with partial calcified plaque in the right bulb and proximal ICA. 3. Partially calcified plaque throughout the left common carotid artery and mild partial calcified plaque in the left bulb, without high-grade stenosis. 4. Antegrade flow within the bilateral vertebral arteries. Electronically signed by: Katheleen Faes MD 01/28/2024 07:18 AM EST RP Workstation: [id number]   MR ANGIO HEAD WO CONTRAST Result Date: 01/28/2024 CLINICAL DATA:  Follow-up examination for acute stroke. EXAM: MRA HEAD WITHOUT CONTRAST TECHNIQUE: Angiographic images of the Circle of Willis were acquired using MRA technique without intravenous contrast. COMPARISON:  Comparison made with prior brain MRI from 01/25/2024. FINDINGS: Anterior circulation: Examination degraded by motion artifact. Both internal carotid arteries are patent through the siphons without convincing stenosis or other abnormality. Apparent irregularity and narrowing about the supraclinoid right ICA on MIP reconstructions appears to be most consistent with artifact on source time-of-flight images (series 1015, image 11). A1 segments patent bilaterally. Normal anterior communicating artery complex. Both ACAs are patent to their distal aspects without visible stenosis. No M1 stenosis or occlusion. No proximal MCA branch occlusion or high-grade stenosis. Distal MCA branches are perfused and symmetric. Posterior circulation: Right vertebral artery dominant and widely patent. Diminutive left vertebral artery patent as well without visible stenosis. Right PICA patent. Left PICA not seen. Basilar patent without stenosis. Superior cerebellar and posterior cerebral arteries patent bilaterally. Anatomic variants: Dominant right vertebral artery. Other: No intracranial aneurysm. IMPRESSION: Negative intracranial MRA. No large vessel occlusion. No hemodynamically significant or correctable stenosis.  Electronically Signed   By: Morene Hoard M.D.   On: 01/28/2024 00:51   DG Chest Port 1 View Result Date: 01/27/2024 CLINICAL DATA:  Central line EXAM: PORTABLE CHEST 1 VIEW COMPARISON:  01/23/2024, 01/22/2024, 07/19/2023 FINDINGS: Left IJ central venous catheter tip overlies the brachiocephalic confluence. Cardiomegaly with central vascular congestion. Worsening airspace disease at the left lung base compared to prior. No evidence of a pneumothorax. Left axillary stent. IMPRESSION: 1. Left IJ central venous catheter tip overlies the brachiocephalic confluence. No pneumothorax. 2. Cardiomegaly with central vascular congestion and mild edema. Worsening airspace disease at the left lung base, atelectasis versus pneumonia. Electronically Signed   By: Luke Bun M.D.   On: 01/27/2024 15:29     Echo preserved left ventricular function  TELEMETRY: Irregularly irregular atrial fibrillation rate of 01/13/2008:  ASSESSMENT AND PLAN:  Principal Problem:   Chest pain Active Problems:   Chronic atrial fibrillation (HCC)   Type 2 diabetes mellitus with chronic kidney disease, without long-term current use of insulin  (HCC)   BPH (benign prostatic hyperplasia)   End-stage renal disease on hemodialysis (HCC)   Influenza   Pressure injury of skin   Bacteremia   Splenic abscess   Bacteremia due to Enterococcus    Plan Atrial flutter with RVR rate controlled currently at around 100 110 patient feels reasonably well tolerating dialysis well Slightly hypertensive blood pressure less than 100 with patient feels reasonably well no pain no shortness of breath End-stage renal disease on dialysis Monday Wednesday Friday tolerating hemodialysis well Diabetes type 2 uncomplicated continue current management  hopefully A1c less than 7 Continue anticoagulation for atrial fibrillation atrial flutter for stroke prevention Shortness of breath multifactorial somewhat improved. Have blood cultures suggestive  of Enterococcus bacteremia considering TEE but was hypotensive Consider TEE in the future once patient stabilizes continue antibiotic therapy Hyperlipidemia continue Lipitor  therapy for lipid management Agree with nephrology input for hemodialysis and renal failure Profound anemia to 4.5 continued to shortness of breath requiring transfusion up to 9 no clear evidence of bleeding thus far   Cara JONETTA Lovelace, MD 01/29/2024 10:38 AM

## 2024-01-30 ENCOUNTER — Encounter (INDEPENDENT_AMBULATORY_CARE_PROVIDER_SITE_OTHER): Payer: Self-pay | Admitting: Nurse Practitioner

## 2024-01-30 ENCOUNTER — Inpatient Hospital Stay

## 2024-01-30 LAB — GLUCOSE, CAPILLARY
Glucose-Capillary: 166 mg/dL — ABNORMAL HIGH (ref 70–99)
Glucose-Capillary: 159 mg/dL — ABNORMAL HIGH (ref 70–99)
Glucose-Capillary: 179 mg/dL — ABNORMAL HIGH (ref 70–99)
Glucose-Capillary: 180 mg/dL — ABNORMAL HIGH (ref 70–99)
Glucose-Capillary: 159 mg/dL — ABNORMAL HIGH (ref 70–99)

## 2024-01-30 LAB — CBC
HCT: 25.9 % — ABNORMAL LOW (ref 39.0–52.0)
HCT: 26.4 % — ABNORMAL LOW (ref 39.0–52.0)
Hemoglobin: 8.2 g/dL — ABNORMAL LOW (ref 13.0–17.0)
Hemoglobin: 8.3 g/dL — ABNORMAL LOW (ref 13.0–17.0)
MCH: 24.8 pg — ABNORMAL LOW (ref 26.0–34.0)
MCH: 24.9 pg — ABNORMAL LOW (ref 26.0–34.0)
MCHC: 31.7 g/dL (ref 30.0–36.0)
MCHC: 31.4 g/dL (ref 30.0–36.0)
MCV: 78.5 fL — ABNORMAL LOW (ref 80.0–100.0)
MCV: 79.3 fL — ABNORMAL LOW (ref 80.0–100.0)
Platelets: 243 K/uL (ref 150–400)
Platelets: 248 K/uL (ref 150–400)
RBC: 3.3 MIL/uL — ABNORMAL LOW (ref 4.22–5.81)
RBC: 3.33 MIL/uL — ABNORMAL LOW (ref 4.22–5.81)
RDW: 19.3 % — ABNORMAL HIGH (ref 11.5–15.5)
RDW: 19.1 % — ABNORMAL HIGH (ref 11.5–15.5)
WBC: 12.3 K/uL — ABNORMAL HIGH (ref 4.0–10.5)
WBC: 12.9 K/uL — ABNORMAL HIGH (ref 4.0–10.5)
nRBC: 0 % (ref 0.0–0.2)
nRBC: 0 % (ref 0.0–0.2)

## 2024-01-30 LAB — BASIC METABOLIC PANEL WITH GFR
Anion gap: 12 (ref 5–15)
BUN: 39 mg/dL — ABNORMAL HIGH (ref 8–23)
CO2: 25 mmol/L (ref 22–32)
Calcium: 9.2 mg/dL (ref 8.9–10.3)
Chloride: 93 mmol/L — ABNORMAL LOW (ref 98–111)
Creatinine, Ser: 3.59 mg/dL — ABNORMAL HIGH (ref 0.61–1.24)
GFR, Estimated: 16 mL/min — ABNORMAL LOW
Glucose, Bld: 205 mg/dL — ABNORMAL HIGH (ref 70–99)
Potassium: 4.1 mmol/L (ref 3.5–5.1)
Sodium: 129 mmol/L — ABNORMAL LOW (ref 135–145)

## 2024-01-30 LAB — HEPARIN LEVEL (UNFRACTIONATED)
Heparin Unfractionated: 0.35 [IU]/mL (ref 0.30–0.70)
Heparin Unfractionated: 0.23 [IU]/mL — ABNORMAL LOW (ref 0.30–0.70)
Heparin Unfractionated: 0.26 [IU]/mL — ABNORMAL LOW (ref 0.30–0.70)

## 2024-01-30 MED ORDER — HEPARIN BOLUS VIA INFUSION
1400.0000 [IU] | Freq: Once | INTRAVENOUS | Status: AC
  Administered 2024-01-30: 1400 [IU] via INTRAVENOUS
  Filled 2024-01-30: qty 1400

## 2024-01-30 MED ORDER — SENNOSIDES-DOCUSATE SODIUM 8.6-50 MG PO TABS
2.0000 | ORAL_TABLET | Freq: Every morning | ORAL | Status: DC
  Administered 2024-01-30 – 2024-02-03 (×5): 2 via ORAL
  Filled 2024-01-30 (×5): qty 2

## 2024-01-30 MED ORDER — PANTOPRAZOLE SODIUM 40 MG PO TBEC
40.0000 mg | DELAYED_RELEASE_TABLET | Freq: Every day | ORAL | Status: DC
  Administered 2024-01-31 – 2024-02-03 (×4): 40 mg via ORAL
  Filled 2024-01-30 (×4): qty 1

## 2024-01-30 NOTE — Progress Notes (Signed)
 "  Subjective:    Patient ID: Jeffery Joseph, male    DOB: 1940/02/02, 84 y.o.   MRN: [id number] Chief Complaint  Patient presents with   Follow-up    PER FB fu + HDA    HPI  Discussed the use of AI scribe software for clinical note transcription with the patient, who gave verbal consent to proceed.  History of Present Illness JUNIOR HUEZO is an 84 year old male with end stage renal disease on hemodialysis via left arm AV fistula who presents for evaluation of intermittent bleeding and suspected stenosis of his dialysis access.  He reports intermittent episodes of increased bleeding from his left arm arteriovenous fistula during dialysis sessions over the past several weeks. The bleeding is episodic, occurring approximately once per week, with no episodes in the past two weeks. He is currently taking anticoagulation therapy. His sister notes only one recent episode of bleeding.  A recent ultrasound of the fistula performed two days prior identified a possible area of narrowing described as a retained valve. He has previously undergone fistulograms for evaluation and management of his access. Flow volume has been reported as adequate.    Results Diagnostic Upper extremity fistula ultrasound (01/18/2024): Retained valve with focal stenosis; flow volume normal   Review of Systems  Hematological:  Bruises/bleeds easily.  All other systems reviewed and are negative.      Objective:   Physical Exam Vitals reviewed.  HENT:     Head: Normocephalic.  Cardiovascular:     Rate and Rhythm: Normal rate.     Pulses:          Radial pulses are 2+ on the right side and 2+ on the left side.     Arteriovenous access: Left arteriovenous access is present.  Pulmonary:     Effort: Pulmonary effort is normal.  Skin:    General: Skin is warm and dry.  Neurological:     Mental Status: He is alert and oriented to person, place, and time.  Psychiatric:        Mood and Affect: Mood normal.         Behavior: Behavior normal.        Thought Content: Thought content normal.        Judgment: Judgment normal.     Physical Exam CARDIOVASCULAR: Fistula in arm pulsatile with good pulse.  BP 119/68   Pulse (!) 120   Resp 17   Ht 6' 5 (1.956 m)   Wt 172 lb (78 kg)   BMI 20.40 kg/m   Past Medical History:  Diagnosis Date   Anxiety    Arthritis    Depression    Diabetes mellitus without complication (HCC)    End stage renal disease (HCC)    Hypertension    Pneumonia    Renal disorder     Social History   Socioeconomic History   Marital status: Married    Spouse name: ,Loraine   Number of children: Not on file   Years of education: Not on file   Highest education level: Not on file  Occupational History   Not on file  Tobacco Use   Smoking status: Never   Smokeless tobacco: Never  Vaping Use   Vaping status: Never Used  Substance and Sexual Activity   Alcohol  use: No   Drug use: Not Currently   Sexual activity: Not Currently  Other Topics Concern   Not on file  Social History Narrative   Not on  file   Social Drivers of Health   Tobacco Use: Low Risk (01/30/2024)   Patient History    Smoking Tobacco Use: Never    Smokeless Tobacco Use: Never    Passive Exposure: Not on file  Recent Concern: Tobacco Use - Medium Risk (12/01/2023)   Received from [facility]   Patient History    Passive Exposure: Not on file    Smokeless Tobacco Use: Never    Smoking Tobacco Use: Former  Physicist, Medical Strain: Low Risk  (12/01/2023)   Received from [facility]   Overall Financial Resource Strain (CARDIA)    Difficulty of Paying Living Expenses: Not hard at all  Food Insecurity: Patient Unable To Answer (01/24/2024)   Epic    Worried About Programme Researcher, Broadcasting/film/video in the Last Year: Patient unable to answer    Ran Out of Food in the Last Year: Patient unable to answer  Transportation Needs: Patient Unable To Answer (01/24/2024)    Epic    Lack of Transportation (Medical): Patient unable to answer    Lack of Transportation (Non-Medical): Patient unable to answer  Physical Activity: Not on file  Stress: Not on file  Social Connections: Unknown (01/24/2024)   Social Connection and Isolation Panel    Frequency of Communication with Friends and Family: Patient unable to answer    Frequency of Social Gatherings with Friends and Family: Patient unable to answer    Attends Religious Services: Patient unable to answer    Active Member of Clubs or Organizations: Not on file    Attends Banker Meetings: Patient unable to answer    Marital Status: Patient unable to answer  Intimate Partner Violence: Patient Unable To Answer (01/24/2024)   Epic    Fear of Current or Ex-Partner: Patient unable to answer    Emotionally Abused: Patient unable to answer    Physically Abused: Patient unable to answer    Sexually Abused: Patient unable to answer  Depression (PHQ2-9): Not on file  Alcohol  Screen: Not on file  Housing: Unknown (01/24/2024)   Epic    Unable to Pay for Housing in the Last Year: Patient unable to answer    Number of Times Moved in the Last Year: 0    Homeless in the Last Year: Patient unable to answer  Utilities: Patient Unable To Answer (01/24/2024)   Epic    Threatened with loss of utilities: Patient unable to answer  Health Literacy: Not on file    Past Surgical History:  Procedure Laterality Date   A/V FISTULAGRAM Left 06/08/2023   Procedure: A/V Fistulagram;  Surgeon: Jama Cordella MATSU, MD;  Location: [facility] INVASIVE CV LAB;  Service: Cardiovascular;  Laterality: Left;   A/V SHUNT INTERVENTION Left 09/29/2023   Procedure: A/V SHUNT INTERVENTION;  Surgeon: Marea Selinda RAMAN, MD;  Location: [facility] INVASIVE CV LAB;  Service: Cardiovascular;  Laterality: Left;   APPLICATION OF WOUND VAC Right 11/14/2021   Procedure: APPLICATION OF WOUND VAC;  Surgeon: Jama Cordella MATSU, MD;  Location: [facility] ORS;  Service:  Vascular;  Laterality: Right;   AV FISTULA PLACEMENT Left 11/14/2021   Procedure: ARTERIOVENOUS (AV) FISTULA CREATION ( BRACHIAL CEPHALIC);  Surgeon: Jama Cordella MATSU, MD;  Location: [facility] ORS;  Service: Vascular;  Laterality: Left;    History reviewed. No pertinent family history.  Allergies[1]     Latest Ref Rng & Units 01/30/2024    9:00 AM 01/30/2024    4:20 AM 01/29/2024    4:41  AM  CBC  WBC 4.0 - 10.5 K/uL 12.9  12.3  8.7   Hemoglobin 13.0 - 17.0 g/dL 8.3  8.2  8.1   Hematocrit 39.0 - 52.0 % 26.4  25.9  26.3   Platelets 150 - 400 K/uL 248  243  214       CMP     Component Value Date/Time   NA 129 (L) 01/30/2024 0420   NA 139 05/06/2012 0843   K 4.1 01/30/2024 0420   K 4.1 05/06/2012 0843   CL 93 (L) 01/30/2024 0420   CL 106 05/06/2012 0843   CO2 25 01/30/2024 0420   CO2 26 05/06/2012 0843   GLUCOSE 205 (H) 01/30/2024 0420   GLUCOSE 112 (H) 05/06/2012 0843   BUN 39 (H) 01/30/2024 0420   BUN 33 (H) 05/06/2012 0843   CREATININE 3.59 (H) 01/30/2024 0420   CREATININE 2.09 (H) 05/06/2012 0843   CALCIUM  9.2 01/30/2024 0420   CALCIUM  9.5 05/06/2012 0843   PROT 6.5 01/25/2024 0330   ALBUMIN  2.6 (L) 01/27/2024 0505   AST 22 01/25/2024 0330   ALT 33 01/25/2024 0330   ALKPHOS 64 01/25/2024 0330   BILITOT 0.7 01/25/2024 0330   GFRNONAA 16 (L) 01/30/2024 0420   GFRNONAA 31 (L) 05/06/2012 0843     No results found.     Assessment & Plan:   1. End stage renal disease (HCC) (Primary) Intermittent bleeding from AV fistula due to retained valve with focal stenosis. Adequate flow volume but increased thrombosis risk. Stenosis concerning for progression. - Scheduled follow-up in three months to reassess fistula status. - Instructed him to notify clinic if bleeding increases for potential fistulogram. - Advised continued monitoring of fistula during dialysis. - VAS US  DUPLEX DIALYSIS ACCESS (AVF, AVG); Future  2. Benign essential hypertension Continue antihypertensive  medications as already ordered, these medications have been reviewed and there are no changes at this time.  3. Type 2 diabetes mellitus with chronic kidney disease on chronic dialysis, without long-term current use of insulin  (HCC) Continue hypoglycemic medications as already ordered, these medications have been reviewed and there are no changes at this time.  Hgb A1C to be monitored as already arranged by primary service  Medications Ordered Prior to Encounter[2]  There are no Patient Instructions on file for this visit. Return in about 3 months (around 04/19/2024) for with HDA JD/FB.   Rahul Malinak E Edda Orea, NP      [1] No Known Allergies [2]  No current facility-administered medications on file prior to visit.   Current Outpatient Medications on File Prior to Visit  Medication Sig Dispense Refill   acetaminophen  (TYLENOL ) 325 MG tablet Take 650 mg by mouth every 6 (six) hours as needed.     ALPRAZolam  (XANAX ) 0.5 MG tablet Take 0.5 mg by mouth 3 (three) times daily as needed for sleep.     amiodarone  (PACERONE ) 200 MG tablet Take 2 tablets (400 mg total) by mouth 2 (two) times daily for 7 days, THEN 1 tablet (200 mg total) daily. 58 tablet 0   apixaban  (ELIQUIS ) 2.5 MG TABS tablet Take 1 tablet (2.5 mg total) by mouth 2 (two) times daily. 60 tablet 0   Cholecalciferol  125 MCG (5000 UT) TABS Take 5,000 Units by mouth daily.     clopidogrel  (PLAVIX ) 75 MG tablet Take 1 tablet (75 mg total) by mouth daily. 30 tablet 11   diclofenac  Sodium (VOLTAREN ) 1 % GEL Apply 2 g topically 4 (four) times daily. 100 g 0  Docusate Sodium  (DSS) 100 MG CAPS Take 100 mg by mouth at bedtime.     donepezil  (ARICEPT ) 10 MG tablet Take 10 mg by mouth at bedtime.     finasteride  (PROSCAR ) 5 MG tablet Take 1 tablet (5 mg total) by mouth daily. 90 tablet 3   lidocaine -prilocaine  (EMLA ) cream Apply 1 Application topically as needed (Dialysis).     metoprolol  succinate (TOPROL -XL) 25 MG 24 hr tablet Take 0.5  tablets (12.5 mg total) by mouth daily. 30 tablet 0   tamsulosin  (FLOMAX ) 0.4 MG CAPS capsule Take 1 capsule (0.4 mg total) by mouth daily. 90 capsule 3   enalapril  (VASOTEC ) 20 MG tablet Take 20 mg by mouth 2 (two) times daily. (Patient not taking: Reported on 01/20/2024)     furosemide  (LASIX ) 40 MG tablet Take 1 tablet (40 mg total) by mouth every other day. (Patient not taking: Reported on 01/20/2024) 30 tablet 0   glipiZIDE  (GLUCOTROL  XL) 5 MG 24 hr tablet Take by mouth. (Patient not taking: Reported on 01/20/2024)     LOKELMA 10 g PACK packet Take 1 packet by mouth daily. (Patient not taking: Reported on 01/20/2024)     sodium bicarbonate  650 MG tablet Take 650 mg by mouth 2 (two) times daily. (Patient not taking: Reported on 01/20/2024)     "

## 2024-01-30 NOTE — Progress Notes (Signed)
 PHARMACIST - PHYSICIAN COMMUNICATION  DR:   Parris  CONCERNING: IV to Oral Route Change Policy  RECOMMENDATION: This patient is receiving pantoprazole  by the intravenous route.  Based on criteria approved by the Pharmacy and Therapeutics Committee, the intravenous medication(s) is/are being converted to the equivalent oral dose form(s).   DESCRIPTION: These criteria include: The patient is eating (either orally or via tube) and/or has been taking other orally administered medications for a least 24 hours The patient has no evidence of active gastrointestinal bleeding or impaired GI absorption (gastrectomy, short bowel, patient on TNA or NPO).  Eulala Newcombe Rodriguez-Guzman PharmD, BCPS 01/30/2024 12:15 PM

## 2024-01-30 NOTE — Consult Note (Signed)
 PHARMACY - ANTICOAGULATION CONSULT NOTE  Pharmacy Consult for IV Heparin  Indication: atrial fibrillation  Allergies[1]  Patient Measurements: Height: 6' 5 (195.6 cm) Weight: 107.3 kg (236 lb 8.9 oz) IBW/kg (Calculated) : 89.1 HEPARIN  DW (KG): 96.9  Labs: Recent Labs    01/28/24 0425 01/28/24 1400 01/29/24 0022 01/29/24 0441 01/29/24 0746 01/29/24 1605 01/30/24 0420 01/30/24 0900 01/30/24 1230  HGB 8.8*  --   --  8.1*  --   --  8.2* 8.3*  --   HCT 28.1*  --   --  26.3*  --   --  25.9* 26.4*  --   PLT 229  --   --  214  --   --  243 248  --   APTT  --  50* 72*  --   --   --   --   --   --   LABPROT  --  18.5*  --   --   --   --   --   --   --   INR  --  1.5*  --   --   --   --   --   --   --   HEPARINUNFRC  --  0.18*  --   --    < > 0.29* 0.35  --  0.23*  CREATININE 3.53*  --   --  2.98*  --   --  3.59*  --   --    < > = values in this interval not displayed.    Estimated Creatinine Clearance: 20.9 mL/min (A) (by C-G formula based on SCr of 3.59 mg/dL (H)).   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 Source thought to be from splenic abscess now s/p drain placement. Patient suffered acute stroke as seen on MRI this admission. Pharmacy consulted to dose heparin  for Afib.  Patient's last dose of apixaban  was on 01/23/24. Suspect effect on anti-Xa should be washed out at this point but with dialysis may take longer.  Goal of Therapy:  Heparin  level 0.3-0.7 units/ml Monitor platelets by anticoagulation  protocol: Yes   Date Time  Results Comments 1/17 0022 aPTT 7 Therapeutic x 1 1/17 0746  HL 0.33  Therapeutic x2 but rate changed to 1150u/h,Hgb/plt stable. 1/17 1607 HL 0.29 Subtherapeutic, rate 1200u/hr 1/18 0420 HL 0.35 Therapeutic x 1 1/18 1230 HL 0.23 Subtherapeutic, rate 1300u/h 1/18 2202 HL 0.26 Subtherapeutic, rate 1450u/h  Plan:  Heparin  bolus 1400 units IV x 1 Increase heparin  infusion at 1600 units/hr Check heparin  level in 8 hours. CBC daily while on heparin .   Lum Mania, PharmD, BCPS 01/30/2024 10:00 PM          [1] No Known Allergies

## 2024-01-30 NOTE — Plan of Care (Signed)
" °  Problem: Nutritional: Goal: Maintenance of adequate nutrition will improve Outcome: Progressing Goal: Progress toward achieving an optimal weight will improve Outcome: Progressing   Problem: Pain Managment: Goal: General experience of comfort will improve and/or be controlled Outcome: Progressing   "

## 2024-01-30 NOTE — Consult Note (Signed)
 PHARMACY - ANTICOAGULATION CONSULT NOTE  Pharmacy Consult for IV Heparin  Indication: atrial fibrillation  Allergies[1]  Patient Measurements: Height: 6' 5 (195.6 cm) Weight: 102.6 kg (226 lb 3.1 oz) IBW/kg (Calculated) : 89.1 HEPARIN  DW (KG): 96.9  Labs: Recent Labs    01/28/24 0425 01/28/24 1400 01/29/24 0022 01/29/24 0441 01/29/24 0746 01/29/24 1605 01/30/24 0420 01/30/24 0900 01/30/24 1230  HGB 8.8*  --   --  8.1*  --   --  8.2* 8.3*  --   HCT 28.1*  --   --  26.3*  --   --  25.9* 26.4*  --   PLT 229  --   --  214  --   --  243 248  --   APTT  --  50* 72*  --   --   --   --   --   --   LABPROT  --  18.5*  --   --   --   --   --   --   --   INR  --  1.5*  --   --   --   --   --   --   --   HEPARINUNFRC  --  0.18*  --   --    < > 0.29* 0.35  --  0.23*  CREATININE 3.53*  --   --  2.98*  --   --  3.59*  --   --    < > = values in this interval not displayed.    Estimated Creatinine Clearance: 19.3 mL/min (A) (by C-G formula based on SCr of 3.59 mg/dL (H)).   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 Source thought to be from splenic abscess now s/p drain placement. Patient suffered acute stroke as seen on MRI this admission. Pharmacy consulted to dose heparin  for Afib.  Patient's last dose of apixaban  was on 01/23/24. Suspect effect on anti-Xa should be washed out at this point but with dialysis may take longer.  Goal of Therapy:  Heparin  level 0.3-0.7 units/ml Monitor platelets by anticoagulation  protocol: Yes   Date Time  Results Comments 1/17 0022 aPTT 7  therapeutic x 1 1/17 0746  HL 0.33  Therapeutic x2 but rate changed to 1150u/h,Hgb/plt stable. 1/17 1607 HL 0.29 Subtherapeutic, rate 1200u/hr 1/18 0420 HL 0.35 Therapeutic x 1 1/8 1230 HL 0.23 Subtherapeutic, rate 1300u/h  Plan:  Heparin  bolus 1400 units IV x 1 Increase heparin  infusion at 1450 units/hr Check confirmatory heparin  level in 8 hours. CBC daily while on heparin .   Nana Vastine Rodriguez-Guzman PharmD, BCPS 01/30/2024 2:06 PM         [1] No Known Allergies

## 2024-01-30 NOTE — Progress Notes (Signed)
 [facility]    SUBJECTIVE: Resting comfortably in bed no pain no palpitation or tachycardia no fever chills or sweats   Vitals:   01/30/24 0300 01/30/24 0400 01/30/24 0500 01/30/24 0600  BP: (!) 108/37 (!) 114/43 (!) 111/44 (!) 109/47  Pulse: (!) 109 (!) 108 (!) 109 (!) 108  Resp: 20 (!) 23 (!) 29 20  Temp:  97.9 F (36.6 C)    TempSrc:  Oral    SpO2: 100% 100% 100% 100%  Weight:      Height:         Intake/Output Summary (Last 24 hours) at 01/30/2024 0835 Last data filed at 01/30/2024 0601 Gross per 24 hour  Intake 1003.14 ml  Output 1050 ml  Net -46.86 ml      PHYSICAL EXAM  General: Well developed, well nourished, in no acute distress HEENT:  Normocephalic and atramatic Neck:  No JVD.  Lungs: Clear bilaterally to auscultation and percussion. Heart: Irregular irregular. Normal S1 and S2 without gallops or murmurs.  Abdomen: Bowel sounds are positive, abdomen soft and non-tender  Msk:  Back normal, normal gait. Normal strength and tone for age. Extremities: No clubbing, cyanosis or edema.   Neuro: Alert and oriented X 3. Psych:  Good affect, responds appropriately   LABS: Basic Metabolic Panel: Recent Labs    01/28/24 0425 01/29/24 0441 01/30/24 0420  NA 130* 132* 129*  K 4.0 4.2 4.1  CL 93* 95* 93*  CO2 26 25 25   GLUCOSE 146* 164* 205*  BUN 30* 26* 39*  CREATININE 3.53* 2.98* 3.59*  CALCIUM  8.7* 8.7* 9.2  MG 2.2 2.2  --   PHOS 3.8  --   --    Liver Function Tests: No results for input(s): AST, ALT, ALKPHOS, BILITOT, PROT, ALBUMIN  in the last 72 hours. No results for input(s): LIPASE, AMYLASE in the last 72 hours. CBC: Recent Labs    01/29/24 0441 01/30/24 0420  WBC 8.7 12.3*  HGB 8.1* 8.2*  HCT 26.3* 25.9*  MCV 79.5* 78.5*  PLT 214 243   Cardiac Enzymes: No results for input(s): CKTOTAL, CKMB, CKMBINDEX, TROPONINI in the last 72 hours. BNP: Invalid input(s): POCBNP D-Dimer: No results for input(s): DDIMER  in the last 72 hours. Hemoglobin A1C: No results for input(s): HGBA1C in the last 72 hours. Fasting Lipid Panel: No results for input(s): CHOL, HDL, LDLCALC, TRIG, CHOLHDL, LDLDIRECT in the last 72 hours. Thyroid Function Tests: No results for input(s): TSH, T4TOTAL, T3FREE, THYROIDAB in the last 72 hours.  Invalid input(s): FREET3 Anemia Panel: No results for input(s): VITAMINB12, FOLATE, FERRITIN, TIBC, IRON, RETICCTPCT in the last 72 hours.  No results found.   Echo mildly depressed left ventricular function EF of 45 to 50%  TELEMETRY: Atrial fibrillation rate of 108 nonspecific ST-T wave changes:  ASSESSMENT AND PLAN:  Principal Problem:   Chest pain Active Problems:   Chronic atrial fibrillation (HCC)   Type 2 diabetes mellitus with chronic kidney disease, without long-term current use of insulin  (HCC)   BPH (benign prostatic hyperplasia)   End-stage renal disease on hemodialysis (HCC)   Influenza   Pressure injury of skin   Bacteremia   Splenic abscess   Bacteremia due to Enterococcus    Plan Continue critical care management support for hypotension continue to wean pressors as appropriate End-stage renal disease on dialysis continue hemodialysis management as per nephrology Enterococcus bacteremia continue broad-spectrum antibiotic therapy for treatment Will defer TEE for now as it may not change management patient unlikely  to benefit from surgical intervention.  Will treat distal empirically patient has endocarditis Continue diabetes management control Chronic atrial fibrillation longstanding persistent continue amiodarone  to help with rhythm and rate still on heparin  hopefully can transition to Eliquis  orally Hypotension probably related to sepsis and Enterococcus bacteremia midodrine  to help with blood pressure support continue pressors wean when appropriate Continue Protonix  therapy for reflux type symptoms No invasive cardiac  procedures recommended at this stage   Cara JONETTA Lovelace, MD, 01/30/2024 8:35 AM

## 2024-01-30 NOTE — Progress Notes (Signed)
 " [facility]  PROGRESS NOTE   Subjective:   Hemodynamically stable today.  Had stable dialysis on Friday.  Objective:  Vital signs: Blood pressure (!) 99/42, pulse (!) 103, temperature 97.6 F (36.4 C), temperature source Axillary, resp. rate (!) 21, height 6' 5 (1.956 m), weight 107.3 kg, SpO2 100%.  Intake/Output Summary (Last 24 hours) at 01/30/2024 1431 Last data filed at 01/30/2024 1244 Gross per 24 hour  Intake 1758.19 ml  Output 1150 ml  Net 608.19 ml   Filed Weights   01/28/24 1645 01/29/24 0448 01/30/24 0701  Weight: 106.3 kg 102.6 kg 107.3 kg     Physical Exam: General:  No acute distress  Head:  Normocephalic, atraumatic. Moist oral mucosal membranes  Eyes:  Anicteric  Neck:  Supple  Lungs:   Clear to auscultation, normal effort  Heart:  S1S2 no rubs  Abdomen:   Soft, nontender, bowel sounds present  Extremities:  peripheral edema.  Neurologic:  Awake, alert, following commands  Skin:  No lesions  Access:     Basic Metabolic Panel: Recent Labs  Lab 01/24/24 1554 01/25/24 0330 01/26/24 0327 01/27/24 0505 01/28/24 0425 01/29/24 0441 01/30/24 0420  NA 132* 130* 128* 132* 130* 132* 129*  K 3.6 3.3* 3.5 3.8 4.0 4.2 4.1  CL 94* 93* 93* 94* 93* 95* 93*  CO2 27 27 26 27 26 25 25   GLUCOSE 173* 147* 137* 135* 146* 164* 205*  BUN 31* 35* 39* 26* 30* 26* 39*  CREATININE 3.37* 3.62* 3.77* 2.97* 3.53* 2.98* 3.59*  CALCIUM  8.3* 8.3* 8.3* 8.8* 8.7* 8.7* 9.2  MG  --  1.9 1.8 1.9 2.2 2.2  --   PHOS 2.5 2.7 3.2 3.1 3.8  --   --    GFR: Estimated Creatinine Clearance: 20.9 mL/min (A) (by C-G formula based on SCr of 3.59 mg/dL (H)).  Liver Function Tests: Recent Labs  Lab 01/24/24 0317 01/24/24 1554 01/25/24 0330 01/27/24 0505  AST 34  --  22  --   ALT 48*  --  33  --   ALKPHOS 66  --  64  --   BILITOT 0.9  --  0.7  --   PROT 6.7  --  6.5  --   ALBUMIN  2.7* 2.7* 2.6* 2.6*   No results for input(s): LIPASE, AMYLASE in the last 168  hours. No results for input(s): AMMONIA in the last 168 hours.  CBC: Recent Labs  Lab 01/25/24 1309 01/26/24 0327 01/27/24 0505 01/28/24 0425 01/29/24 0441 01/30/24 0420 01/30/24 0900  WBC 11.3*   < > 8.5 9.0 8.7 12.3* 12.9*  NEUTROABS 8.8*  --   --   --   --   --   --   HGB 9.1*   < > 8.9* 8.8* 8.1* 8.2* 8.3*  HCT 28.6*   < > 28.5* 28.1* 26.3* 25.9* 26.4*  MCV 77.3*   < > 78.9* 79.2* 79.5* 78.5* 79.3*  PLT 202   < > 227 229 214 243 248   < > = values in this interval not displayed.     HbA1C: Hgb A1c MFr Bld  Date/Time Value Ref Range Status  01/23/2024 04:30 AM 5.9 (H) 4.8 - 5.6 % Final    Comment:    (NOTE) Diagnosis of Diabetes The following HbA1c ranges recommended by the American Diabetes Association (ADA) may be used as an aid in the diagnosis of diabetes mellitus.  Hemoglobin  Suggested A1C NGSP%              Diagnosis  <5.7                   Non Diabetic  5.7-6.4                Pre-Diabetic  >6.4                   Diabetic  <7.0                   Glycemic control for                       adults with diabetes.    01/23/2024 03:23 AM 5.7 (H) 4.8 - 5.6 % Final    Comment:    (NOTE) Diagnosis of Diabetes The following HbA1c ranges recommended by the American Diabetes Association (ADA) may be used as an aid in the diagnosis of diabetes mellitus.  Hemoglobin             Suggested A1C NGSP%              Diagnosis  <5.7                   Non Diabetic  5.7-6.4                Pre-Diabetic  >6.4                   Diabetic  <7.0                   Glycemic control for                       adults with diabetes.      Urinalysis: No results for input(s): COLORURINE, LABSPEC, PHURINE, GLUCOSEU, HGBUR, BILIRUBINUR, KETONESUR, PROTEINUR, UROBILINOGEN, NITRITE, LEUKOCYTESUR in the last 72 hours.  Invalid input(s): APPERANCEUR    Imaging: No results found.   Medications:    ampicillin  (OMNIPEN) IV Stopped  (01/30/24 1207)   cefTRIAXone  (ROCEPHIN )  IV Stopped (01/30/24 1133)   dexmedetomidine  (PRECEDEX ) IV infusion Stopped (01/27/24 2149)   heparin  1,450 Units/hr (01/30/24 1428)   norepinephrine  (LEVOPHED ) Adult infusion 4 mcg/min (01/30/24 1244)    amiodarone   100 mg Oral Daily   Chlorhexidine  Gluconate Cloth  6 each Topical Daily   cholecalciferol   5,000 Units Oral Daily   diclofenac  Sodium  2 g Topical QID   donepezil   10 mg Oral QHS   feeding supplement  237 mL Oral BID BM   finasteride   5 mg Oral Daily   hydrocortisone  sod succinate (SOLU-CORTEF ) inj  50 mg Intravenous Q6H   insulin  aspart  0-15 Units Subcutaneous TID WC   insulin  aspart  0-5 Units Subcutaneous QHS   midodrine   15 mg Oral TID WC   [START ON 01/31/2024] pantoprazole   40 mg Oral Daily   senna-docusate  2 tablet Oral q AM   tamsulosin   0.4 mg Oral Daily    Assessment/ Plan:     84 year old male with history of hypertension, coronary artery disease, atrial fibrillation, diabetes, anemia, anxiety/depression, BPH and end-stage renal disease on hemodialysis Monday Wednesday Friday schedule now admitted with history of chest tightness and shortness of breath.   #1: ESRD: Patient had stable dialysis on Friday with fluid removal.  Will schedule for dialysis tomorrow.   #2: Anemia: Continue anemia protocol with  iron and Epogen  as needed.   #3: Secondary parathyroidism: Will monitor calcium , phosphorus and PTH.   #4: Sepsis: Patient has Enterococcus bacteremia.  Patient also has splenic abscess.  Presently on ampicillin  and Rocephin .   #5: Hypotension: Patient is still on Levophed .  Will need to titrate to off as tolerated.  Labs and medications reviewed. Will continue to follow along with you.   LOS: 7 Pinkey Edman, MD [facility] 1/18/20262:31 PM  "

## 2024-01-30 NOTE — Consult Note (Signed)
 PHARMACY - ANTICOAGULATION CONSULT NOTE  Pharmacy Consult for IV Heparin  Indication: atrial fibrillation  Allergies[1]  Patient Measurements: Height: 6' 5 (195.6 cm) Weight: 102.6 kg (226 lb 3.1 oz) IBW/kg (Calculated) : 89.1 HEPARIN  DW (KG): 96.9  Labs: Recent Labs    01/27/24 0505 01/28/24 0425 01/28/24 1400 01/29/24 0022 01/29/24 0441 01/29/24 0746 01/29/24 1605 01/30/24 0420  HGB 8.9* 8.8*  --   --  8.1*  --   --  8.2*  HCT 28.5* 28.1*  --   --  26.3*  --   --  25.9*  PLT 227 229  --   --  214  --   --  243  APTT  --   --  50* 72*  --   --   --   --   LABPROT  --   --  18.5*  --   --   --   --   --   INR  --   --  1.5*  --   --   --   --   --   HEPARINUNFRC  --   --  0.18*  --   --  0.33 0.29*  --   CREATININE 2.97* 3.53*  --   --  2.98*  --   --   --     Estimated Creatinine Clearance: 23.3 mL/min (A) (by C-G formula based on SCr of 2.98 mg/dL (H)).   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 Source thought to be from splenic abscess now s/p drain placement. Patient suffered acute stroke as seen on MRI this admission. Pharmacy consulted to dose heparin  for Afib.  Patient's last dose of apixaban  was on 01/23/24. Suspect effect on anti-Xa should be washed out at this point but with dialysis may take longer.  Goal of Therapy:  Heparin  level 0.3-0.7 units/ml Monitor platelets by anticoagulation protocol: Yes   Date Time  Results Comments 1/17 0022 aPTT 7  therapeutic x 1 1/17 0746  HL 0.33  Therapeutic  x2 but rate changed to 1150u/h,Hgb/plt stable. 1/17 1607 HL 0.29 Subtherapeutic, rate 1200u/hr 1/18 0420 HL 0.35 Therapeutic x 1  Plan:  Heparin  level therapeutic x 1 Continue heparin  infusion at 1300 units/hr Check confirmatory heparin  level in 8 hours. CBC daily while on heparin .   Lum Mania, PharmD, BCPS 01/30/2024 4:36 AM       [1] No Known Allergies

## 2024-01-30 NOTE — Progress Notes (Signed)
 "  NAME:  Jeffery Joseph, MRN:  [id number], DOB:  Jun 03, 1940, LOS: 7 ADMISSION DATE:  01/22/2024, CONSULTATION DATE: 01/24/2024 REFERRING MD: Dr. 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 Symptoms began several days prior to presentation. Patient was taking oseltamivir  for possible influenza. He reported dry cough and denied nausea, vomiting, abdominal pain, diarrhea, melena, or hematochezia. Last hemodialysis session was on Friday prior to admission.  ED Course: On arrival, vital signs: BP 106/49 mmHg, HR 111 bpm, RR 20, T 3F, SpO? 100% on room air. Initial labs showed WBC 11.8 K/L, Hgb 8.0 g/dL, platelets 790 K/L, sodium 134 mmol/L, potassium 3.5 mmol/L, BUN 46 mg/dL, creatinine 4.7 mg/dL, lactate 2.2 mmol/L, AST 73 U/L, ALT 70 U/L. Chest X-ray demonstrated cardiomegaly without acute consolidation. CT chest without contrast showed cardiomegaly and no acute airspace disease. Patient received 500 mL IV normal saline bolus, famotidine , and antacids and was admitted to TRH service for observation.   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), T2DM, HTN, PAD, chronic atrial fibrillation on anticoagulation, chronic anemia, anxiety/depression, and BPH   Micro Data  COVID/Influenza A&B/RSV 01/11>>negative  RVP 01/11>>negative  Blood x2 01/11>>enterococcus Faecalis  MRSA PCR 01/12>>negative  Tracheal aspirate 01/13>>  Significant Hospital Events: Including procedures, antibiotic start and stop dates in addition to other pertinent events   1/11: Admitted to trh service 1/12: Became hypotensive  started on levophed , critical care consulted 1/13: Pt continues to require levophed  gtt complains of his stomach not feeling good CT   Abd Pelvis pending concerning for splenic fluid collection concerning for possible abscess, Surgery and IR consulted, recommends aspiration and drain placement.  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 1/14: No acute events overnight, afebrile, remains on Levophed  (weaned to 6 mcg).  More awake and alert, consult Neurology for acute CVA on MRI yesterday.  Tentative plan for IR to perform splenic aspiration/drain today.  Cardiology tentatively planning for TEE tomorrow. 01/27/24- patient with sepsis bacteremia, for TEE today.   Had splenic drain (cultures with GPC+) and dialysis yesterday. ID on case is on ampicillin .  Concern for AV graft fistula contamination.   01/28/24- less output from splenic drain this am, making some urine and gets dialysis.  He's on levophed  14mcg. Starting solucortef for suspected arenal insufficiency.   01/30/24- patient is on levophed  weaned from 12 to 9 this morning.  Mental status is with intermittent confusion.  Cbc with stable leukocytosis and chornic anemia, abscess culture with klebsiella , blood culture with enterococcus.  Treated with ampicillin /rocephin .  Family at bedside today, declined IV fluids post discussion of medical plan. Remains on midodrine  15tid and levophed . Poor prognosis DNR/DNI working with palliative.   Interim History / Subjective:  As outlined above under significant events   Objective    Blood pressure (!) 109/47, pulse (!) 108, temperature 97.9 F (36.6 C), temperature source Oral, resp. rate 20, height 6' 5 (1.956 m), weight 102.6 kg, SpO2 100%.        Intake/Output Summary (Last 24 hours) at 01/30/2024 0848 Last data filed at 01/30/2024 0601 Gross per 24 hour  Intake 1003.14 ml  Output 1050 ml  Net -46.86 ml   Filed Weights   01/28/24 1314 01/28/24 1645 01/29/24 0448  Weight:  107.4 kg 106.3 kg 102.6 kg    Examination: General: Acute on chronically-ill appearing male, NAD on RA  HENT: Supple, no JVD  Lungs: Diminished throughout, even, non labored  Cardiovascular: Sinus tachycardia, no m/r/g, 2+ radial/1+, trace generalized edema Abdomen: Hypoactive BS x4, obese, taut, non tender  Extremities: Normal bulk and tone, moves all extremities  Neuro: Alert and oriented x1, follows commands, PERRLA GU: Deferred   Specimen Description ABSCESS Performed at [facility], [address], [place], [place] [address]  Special Requests Normal WD Performed at [facility], [address] [place], [place] [address]  Gram Stain MODERATE WBC PRESENT, PREDOMINANTLY PMN MODERATE GRAM POSITIVE COCCI Performed at [facility], [address] [place], [place] [address]  Culture ABUNDANT KLEBSIELLA PNEUMONIAE ABUNDANT ENTEROCOCCUS FAECALIS NO ANAEROBES ISOLATED; CULTURE IN PROGRESS FOR 5 DAYS  Report Status PENDING  Organism ID, Bacteria KLEBSIELLA PNEUMONIAE    Blood Culture ID Panel (Reflexed) Order: [id number]  Status: Final result     Next appt: 04/20/2024 at 08:30 AM in Vascular Surgery ([facility] VASC 3)   Test Result Released: Yes (not seen)   0 Result Notes    Component Ref Range & Units (hover) 6 d ago  Enterococcus faecalis DETECTED Abnormal   Comment: CRITICAL RESULT CALLED TO, READ BACK BY AND VERIFIED WITH: MAYA CHARLENA NAPOLEON [id number] @ 2318 FH     Assessment and Plan   #Acute CVA #Dementia #Depression  MRI Brain 01/25/24: 5 mm acute infarct at the left temporooccipital junction: Mild chronic small vessel ischemic disease. -Treatment of metabolic derangements as outlined above -Provide supportive care -Promote normal sleep/wake cycle and family presence -Avoid sedating medications as able -Neurology consulted, appreciate input  -Continue outpatient donepezil  and prn xanax    #Shock: Septic and hypovolemic -present on admission  - due to bacteremia and spelnic abscess- on ampiccillin and rocephin  . ID on case appreciate input Dr Fayette           Lactate normal 01/29/24 , mentation with baseline dementia and acute critical illness acceptable with GCS12 -s/p PCD and IV abx, may need splenectomy if fails #Elevated troponin due to demand ischemia  #Chronic atrial flutter/fibrillation   #Acute on chronic HFpEF  Hx: HTN and PAD  -Echocardiogram 01/25/24: LVEF 45 to 50%, grade 2 diastolic dysfunction, RV systolic function is low normal, RV size mildly enlarged mild to moderate mitral valve regurgitation, mild to moderate tricuspid valve regurgitation, mild aortic valve regurgitation, mild aortic valve stenosis -Continuous cardiac monitoring -Maintain MAP >65 -Cautious IV fluids -Vasopressors as needed to maintain MAP goal -Continue Midodrine   -Trend lactic acid until normalized -HS Troponin peaked at 789 -Volume removal with dialysis -Cardiology following, appreciate inpnt -Continue scheduled amiodarone  and finasteride  -Holding Anticoagulation for now given pending IR aspiration of splenic abscess and new CVA  #Previous influenza infection  -repeat cxr  #Pulmonary edema  - Supplemental O2 for dyspnea and/or hypoxia - Maintain O2 sats 92% or higher  - Prn CXR's  - Fluid removal as tolerated during HD   #ESRD on HD  #Hypokalemia  #Hyponatremia  #Hypomagnesia  - Trend BMP  - Replace electrolytes as indicated  - Strict I&O's - Nephrology consulted appreciate input: HD per recommendations   #Anemia of chronic kidney disease  - Trend CBC  - Monitor for s/sx of bleeding  - Transfuse for hgb <7  Type II diabetes mellitus  -  CBG's ac/hs - SSI  - Target CBG readings 140 to 180 - Follow hypo/hyperglycemic protocol     Labs   CBC: Recent Labs  Lab 01/25/24 1309 01/26/24 0327 01/27/24 0505 01/28/24 0425 01/29/24 0441 01/30/24 0420  WBC 11.3* 10.5 8.5 9.0 8.7 12.3*  NEUTROABS 8.8*  --   --   --    --   --   HGB 9.1* 8.6* 8.9* 8.8* 8.1* 8.2*  HCT 28.6* 27.1* 28.5* 28.1* 26.3* 25.9*  MCV 77.3* 78.1* 78.9* 79.2* 79.5* 78.5*  PLT 202 193 227 229 214 243    Basic Metabolic Panel: Recent Labs  Lab 01/24/24 1554 01/25/24 0330 01/26/24 0327 01/27/24 0505 01/28/24 0425 01/29/24 0441 01/30/24 0420  NA 132* 130* 128* 132* 130* 132* 129*  K 3.6 3.3* 3.5 3.8 4.0 4.2 4.1  CL 94* 93* 93* 94* 93* 95* 93*  CO2 27 27 26 27 26 25 25   GLUCOSE 173* 147* 137* 135* 146* 164* 205*  BUN 31* 35* 39* 26* 30* 26* 39*  CREATININE 3.37* 3.62* 3.77* 2.97* 3.53* 2.98* 3.59*  CALCIUM  8.3* 8.3* 8.3* 8.8* 8.7* 8.7* 9.2  MG  --  1.9 1.8 1.9 2.2 2.2  --   PHOS 2.5 2.7 3.2 3.1 3.8  --   --    GFR: Estimated Creatinine Clearance: 19.3 mL/min (A) (by C-G formula based on SCr of 3.59 mg/dL (H)). Recent Labs  Lab 01/23/24 2200 01/24/24 0317 01/24/24 2318 01/25/24 0330 01/25/24 1158 01/25/24 1309 01/27/24 0505 01/28/24 0425 01/29/24 0441 01/29/24 1148 01/30/24 0420  PROCALCITON 100.00  --   --   --   --   --   --   --   --   --   --   WBC  --    < >  --    < >  --    < > 8.5 9.0 8.7  --  12.3*  LATICACIDVEN 1.4  --  1.5  --  2.1*  --   --   --   --  1.0  --    < > = values in this interval not displayed.    Liver Function Tests: Recent Labs  Lab 01/24/24 0317 01/24/24 1554 01/25/24 0330 01/27/24 0505  AST 34  --  22  --   ALT 48*  --  33  --   ALKPHOS 66  --  64  --   BILITOT 0.9  --  0.7  --   PROT 6.7  --  6.5  --   ALBUMIN  2.7* 2.7* 2.6* 2.6*   No results for input(s): LIPASE, AMYLASE in the last 168 hours. No results for input(s): AMMONIA in the last 168 hours.  ABG    Component Value Date/Time   HCO3 27.2 01/23/2024 2300   TCO2 17 (L) 11/14/2021 0718   O2SAT 98.3 01/29/2024 1255     Coagulation Profile: Recent Labs  Lab 01/23/24 1859 01/26/24 0949 01/28/24 1400  INR 2.2* 1.6* 1.5*    Cardiac Enzymes: No results for input(s): CKTOTAL, CKMB, CKMBINDEX,  TROPONINI in the last 168 hours.  HbA1C: Hgb A1c MFr Bld  Date/Time Value Ref Range Status  01/23/2024 04:30 AM 5.9 (H) 4.8 - 5.6 % Final    Comment:    (NOTE) Diagnosis of Diabetes The following HbA1c ranges recommended by the American Diabetes Association (ADA) may be used as an aid in the diagnosis of diabetes mellitus.  Hemoglobin  Suggested A1C NGSP%              Diagnosis  <5.7                   Non Diabetic  5.7-6.4                Pre-Diabetic  >6.4                   Diabetic  <7.0                   Glycemic control for                       adults with diabetes.    01/23/2024 03:23 AM 5.7 (H) 4.8 - 5.6 % Final    Comment:    (NOTE) Diagnosis of Diabetes The following HbA1c ranges recommended by the American Diabetes Association (ADA) may be used as an aid in the diagnosis of diabetes mellitus.  Hemoglobin             Suggested A1C NGSP%              Diagnosis  <5.7                   Non Diabetic  5.7-6.4                Pre-Diabetic  >6.4                   Diabetic  <7.0                   Glycemic control for                       adults with diabetes.      CBG: Recent Labs  Lab 01/29/24 0747 01/29/24 1220 01/29/24 1659 01/29/24 2134 01/30/24 0740  GLUCAP 144* 213* 188* 186* 166*    Review of Systems: Positives in BOLD   Gen: Denies fever, chills, weight change, fatigue, night sweats HEENT: Denies blurred vision, double vision, hearing loss, tinnitus, sinus congestion, rhinorrhea, sore throat, neck stiffness, dysphagia PULM: Denies shortness of breath, cough, sputum production, hemoptysis, wheezing CV: Denies chest pain, edema, orthopnea, paroxysmal nocturnal dyspnea, palpitations GI: abdominal discomfort, nausea, vomiting, diarrhea, hematochezia, melena, constipation, change in bowel habits GU: Denies dysuria, hematuria, polyuria, oliguria, urethral discharge Endocrine: Denies hot or cold intolerance, polyuria, polyphagia or  appetite change Derm: Denies rash, dry skin, scaling or peeling skin change Heme: Denies easy bruising, bleeding, bleeding gums Neuro: Denies headache, numbness, weakness, slurred speech, loss of memory or consciousness  Past Medical History:  He,  has a past medical history of Anxiety, Arthritis, Depression, Diabetes mellitus without complication (HCC), End stage renal disease (HCC), Hypertension, Pneumonia, and Renal disorder.   Surgical History:   Past Surgical History:  Procedure Laterality Date   A/V FISTULAGRAM Left 06/08/2023   Procedure: A/V Fistulagram;  Surgeon: Jama Cordella MATSU, MD;  Location: [facility];  Service: Cardiovascular;  Laterality: Left;   A/V SHUNT INTERVENTION Left 09/29/2023   Procedure: A/V SHUNT INTERVENTION;  Surgeon: Marea Selinda RAMAN, MD;  Location: [facility];  Service: Cardiovascular;  Laterality: Left;   APPLICATION OF WOUND VAC Right 11/14/2021   Procedure: APPLICATION OF WOUND VAC;  Surgeon: Jama Cordella MATSU, MD;  Location: [facility] ORS;  Service: Vascular;  Laterality: Right;   AV FISTULA PLACEMENT Left  11/14/2021   Procedure: ARTERIOVENOUS (AV) FISTULA CREATION ( BRACHIAL CEPHALIC);  Surgeon: Jama Cordella MATSU, MD;  Location: [facility] ORS;  Service: Vascular;  Laterality: Left;     Social History:   reports that he has never smoked. He has never used smokeless tobacco. He reports that he does not currently use drugs. He reports that he does not drink alcohol .   Family History:  His family history is not on file.   Allergies Allergies[1]   Home Medications  Prior to Admission medications  Medication Sig Start Date End Date Taking? Authorizing Provider  acetaminophen  (TYLENOL ) 325 MG tablet Take 650 mg by mouth every 6 (six) hours as needed.   Yes [provider]  ALPRAZolam  (XANAX ) 0.5 MG tablet Take 0.5 mg by mouth 3 (three) times daily as needed for sleep. 02/05/21  Yes [provider]  amiodarone  (PACERONE ) 200 MG  tablet Take 2 tablets (400 mg total) by mouth 2 (two) times daily for 7 days, THEN 1 tablet (200 mg total) daily. 07/24/23 01/22/24 Yes Sreenath, Sudheer B, MD  apixaban  (ELIQUIS ) 2.5 MG TABS tablet Take 1 tablet (2.5 mg total) by mouth 2 (two) times daily. 07/24/23 01/23/24 Yes Sreenath, Sudheer B, MD  Cholecalciferol  125 MCG (5000 UT) TABS Take 5,000 Units by mouth daily.   Yes [provider]  clopidogrel  (PLAVIX ) 75 MG tablet Take 1 tablet (75 mg total) by mouth daily. 06/09/23  Yes Schnier, Cordella MATSU, MD  diclofenac  Sodium (VOLTAREN ) 1 % GEL Apply 2 g topically 4 (four) times daily. 07/24/23  Yes Sreenath, Sudheer B, MD  Docusate Sodium  (DSS) 100 MG CAPS Take 100 mg by mouth at bedtime.   Yes [provider]  donepezil  (ARICEPT ) 10 MG tablet Take 10 mg by mouth at bedtime. 05/14/21  Yes [provider]  finasteride  (PROSCAR ) 5 MG tablet Take 1 tablet (5 mg total) by mouth daily. 08/12/19  Yes Stoioff, Glendia BROCKS, MD  lidocaine -prilocaine  (EMLA ) cream Apply 1 Application topically as needed (Dialysis). 08/18/23  Yes [provider]  metoprolol  succinate (TOPROL -XL) 25 MG 24 hr tablet Take 0.5 tablets (12.5 mg total) by mouth daily. 07/24/23 01/22/24 Yes Sreenath, Sudheer B, MD  tamsulosin  (FLOMAX ) 0.4 MG CAPS capsule Take 1 capsule (0.4 mg total) by mouth daily. 08/12/19  Yes Stoioff, Glendia BROCKS, MD  enalapril  (VASOTEC ) 20 MG tablet Take 20 mg by mouth 2 (two) times daily. Patient not taking: Reported on 01/20/2024 08/12/21   [provider]  furosemide  (LASIX ) 40 MG tablet Take 1 tablet (40 mg total) by mouth every other day. Patient not taking: Reported on 01/20/2024 07/25/23   Jhonny Calvin NOVAK, MD  glipiZIDE  (GLUCOTROL  XL) 5 MG 24 hr tablet Take by mouth. Patient not taking: Reported on 01/20/2024 12/01/11   [provider]  LOKELMA 10 g PACK packet Take 1 packet by mouth daily. Patient not taking: Reported on 01/20/2024 07/12/23   [provider]  sodium  bicarbonate 650 MG tablet Take 650 mg by mouth 2 (two) times daily. Patient not taking: Reported on 01/20/2024 06/16/23   [provider]     Critical care provider statement:   Total critical care time: 33 minutes   Performed by: Parris MD   Critical care time was exclusive of separately billable procedures and treating other patients.   Critical care was necessary to treat or prevent imminent or life-threatening deterioration.   Critical care was time spent personally by me on the following activities: development of treatment plan  with patient and/or surrogate as well as nursing, discussions with consultants, evaluation of patient's response to treatment, examination of patient, obtaining history from patient or surrogate, ordering and performing treatments and interventions, ordering and review of laboratory studies, ordering and review of radiographic studies, pulse oximetry and re-evaluation of patient's condition.    Vondell Babers, M.D.  Pulmonary & Critical Care Medicine                  [1] No Known Allergies  "

## 2024-01-31 DIAGNOSIS — B961 Klebsiella pneumoniae [K. pneumoniae] as the cause of diseases classified elsewhere: Secondary | ICD-10-CM

## 2024-01-31 DIAGNOSIS — D631 Anemia in chronic kidney disease: Secondary | ICD-10-CM | POA: Diagnosis not present

## 2024-01-31 DIAGNOSIS — Z794 Long term (current) use of insulin: Secondary | ICD-10-CM | POA: Diagnosis not present

## 2024-01-31 DIAGNOSIS — I959 Hypotension, unspecified: Secondary | ICD-10-CM | POA: Diagnosis not present

## 2024-01-31 DIAGNOSIS — E1122 Type 2 diabetes mellitus with diabetic chronic kidney disease: Secondary | ICD-10-CM | POA: Diagnosis not present

## 2024-01-31 DIAGNOSIS — D733 Abscess of spleen: Secondary | ICD-10-CM | POA: Diagnosis not present

## 2024-01-31 DIAGNOSIS — Z992 Dependence on renal dialysis: Secondary | ICD-10-CM | POA: Diagnosis not present

## 2024-01-31 DIAGNOSIS — N186 End stage renal disease: Secondary | ICD-10-CM | POA: Diagnosis not present

## 2024-01-31 DIAGNOSIS — B952 Enterococcus as the cause of diseases classified elsewhere: Secondary | ICD-10-CM | POA: Diagnosis not present

## 2024-01-31 DIAGNOSIS — R7881 Bacteremia: Secondary | ICD-10-CM | POA: Diagnosis not present

## 2024-01-31 LAB — RENAL FUNCTION PANEL
Albumin: 3.1 g/dL — ABNORMAL LOW (ref 3.5–5.0)
Anion gap: 12 (ref 5–15)
BUN: 56 mg/dL — ABNORMAL HIGH (ref 8–23)
CO2: 24 mmol/L (ref 22–32)
Calcium: 9 mg/dL (ref 8.9–10.3)
Chloride: 92 mmol/L — ABNORMAL LOW (ref 98–111)
Creatinine, Ser: 4.08 mg/dL — ABNORMAL HIGH (ref 0.61–1.24)
GFR, Estimated: 14 mL/min — ABNORMAL LOW
Glucose, Bld: 189 mg/dL — ABNORMAL HIGH (ref 70–99)
Phosphorus: 4.7 mg/dL — ABNORMAL HIGH (ref 2.5–4.6)
Potassium: 4.4 mmol/L (ref 3.5–5.1)
Sodium: 128 mmol/L — ABNORMAL LOW (ref 135–145)

## 2024-01-31 LAB — HEPARIN LEVEL (UNFRACTIONATED)
Heparin Unfractionated: 0.35 [IU]/mL (ref 0.30–0.70)
Heparin Unfractionated: 0.3 [IU]/mL (ref 0.30–0.70)

## 2024-01-31 LAB — AEROBIC/ANAEROBIC CULTURE W GRAM STAIN (SURGICAL/DEEP WOUND): Special Requests: NORMAL

## 2024-01-31 LAB — CBC
HCT: 26.6 % — ABNORMAL LOW (ref 39.0–52.0)
Hemoglobin: 8.3 g/dL — ABNORMAL LOW (ref 13.0–17.0)
MCH: 24.7 pg — ABNORMAL LOW (ref 26.0–34.0)
MCHC: 31.2 g/dL (ref 30.0–36.0)
MCV: 79.2 fL — ABNORMAL LOW (ref 80.0–100.0)
Platelets: 255 K/uL (ref 150–400)
RBC: 3.36 MIL/uL — ABNORMAL LOW (ref 4.22–5.81)
RDW: 19.1 % — ABNORMAL HIGH (ref 11.5–15.5)
WBC: 10.7 K/uL — ABNORMAL HIGH (ref 4.0–10.5)
nRBC: 0 % (ref 0.0–0.2)

## 2024-01-31 LAB — GLUCOSE, CAPILLARY
Glucose-Capillary: 165 mg/dL — ABNORMAL HIGH (ref 70–99)
Glucose-Capillary: 123 mg/dL — ABNORMAL HIGH (ref 70–99)
Glucose-Capillary: 176 mg/dL — ABNORMAL HIGH (ref 70–99)
Glucose-Capillary: 143 mg/dL — ABNORMAL HIGH (ref 70–99)

## 2024-01-31 MED ORDER — RENA-VITE PO TABS
1.0000 | ORAL_TABLET | Freq: Every day | ORAL | Status: DC
  Administered 2024-01-31 – 2024-02-02 (×3): 1 via ORAL
  Filled 2024-01-31 (×3): qty 1

## 2024-01-31 MED ORDER — BISACODYL 10 MG RE SUPP
10.0000 mg | Freq: Every day | RECTAL | Status: DC | PRN
  Administered 2024-02-01: 10 mg via RECTAL
  Filled 2024-01-31: qty 1

## 2024-01-31 MED ORDER — LACTULOSE 10 GM/15ML PO SOLN
30.0000 g | Freq: Two times a day (BID) | ORAL | Status: AC
  Administered 2024-01-31 (×2): 30 g via ORAL
  Filled 2024-01-31 (×2): qty 60

## 2024-01-31 MED ORDER — HEPARIN BOLUS VIA INFUSION
2000.0000 [IU] | Freq: Once | INTRAVENOUS | Status: DC
  Filled 2024-01-31: qty 2000

## 2024-01-31 MED ORDER — SODIUM CHLORIDE 1 G PO TABS
1.0000 g | ORAL_TABLET | Freq: Three times a day (TID) | ORAL | Status: DC
  Filled 2024-01-31 (×2): qty 1

## 2024-01-31 NOTE — Progress Notes (Signed)
" °   01/31/24 1203  Vitals  Temp (!) 97.5 F (36.4 C)  Temp Source Oral  BP (!) 105/42  MAP (mmHg) (!) 61  BP Location Right Arm  BP Method Automatic  Patient Position (if appropriate) Lying  Pulse Rate (!) 110  Pulse Rate Source Monitor  ECG Heart Rate (!) 110  Resp (!) 21  Oxygen Therapy  SpO2 100 %  O2 Device Room Air  Patient Activity (if Appropriate) In bed  Pulse Oximetry Type Continuous  Oximetry Probe Site Changed No  During Treatment Monitoring  Blood Flow Rate (mL/min) 319 mL/min  Arterial Pressure (mmHg) -172.11 mmHg  Venous Pressure (mmHg) 198.78 mmHg  TMP (mmHg) 16.56 mmHg  Ultrafiltration Rate (mL/min) 675 mL/min  Dialysate Flow Rate (mL/min) 300 ml/min  Dialysate Potassium Concentration 3  Dialysate Calcium  Concentration 2.5  Duration of HD Treatment -hour(s) 3.45 hour(s)  Cumulative Fluid Removed (mL) per Treatment  964.97  HD Safety Checks Performed Yes  Intra-Hemodialysis Comments Tx completed  Post Treatment  Dialyzer Clearance Clear  Hemodialysis Intake (mL) 0 mL  Liters Processed 65.6  Fluid Removed (mL) 1000 mL  Tolerated HD Treatment Yes  Fistula / Graft Left Upper arm  No placement date or time found.   Orientation: Left  Access Location: Upper arm  Site Condition No complications  Fistula / Graft Assessment Present;Thrill;Bruit  Status Deaccessed;Flushed;Patent  Drainage Description None   Tolerated tx well. Pt removed 1 Liter. BFR 320 Heparin  given 1000u given in machine circuit.venous site cannulated x2 No other issues. Report given to icu nurse.  "

## 2024-01-31 NOTE — Consult Note (Signed)
 PHARMACY - ANTICOAGULATION CONSULT NOTE  Pharmacy Consult for IV Heparin  Indication: atrial fibrillation  Allergies[1]  Patient Measurements: Height: 6' 5 (195.6 cm) Weight: 108.2 kg (238 lb 8.6 oz) IBW/kg (Calculated) : 89.1 HEPARIN  DW (KG): 96.9  Labs: Recent Labs     0000 01/29/24 0022 01/29/24 0441 01/29/24 0746 01/30/24 0420 01/30/24 0900 01/30/24 1230 01/30/24 2202 01/31/24 0549 01/31/24 0823 01/31/24 0831 01/31/24 1618  HGB   < >  --  8.1*  --  8.2* 8.3*  --   --  8.3*  --   --   --   HCT   < >  --  26.3*  --  25.9* 26.4*  --   --  26.6*  --   --   --   PLT   < >  --  214  --  243 248  --   --  255  --   --   --   APTT  --  72*  --   --   --   --   --   --   --   --   --   --   HEPARINUNFRC  --   --   --    < > 0.35  --    < > 0.26*  --   --  0.35 0.30  CREATININE  --   --  2.98*  --  3.59*  --   --   --   --  4.08*  --   --    < > = values in this interval not displayed.    Estimated Creatinine Clearance: 18.4 mL/min (A) (by C-G formula based on SCr of 4.08 mg/dL (H)).   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 Source thought to be from splenic abscess now s/p drain placement. Patient suffered acute stroke as seen on MRI this admission. Pharmacy consulted to dose heparin  for Afib.  Patient's last dose of apixaban  was on 01/23/24  Goal of Therapy:  Heparin  level 0.3-0.7 units/ml Monitor platelets by anticoagulation protocol: Yes   Date Time  Results Comments 1/17 0022 aPTT 7 Therapeutic x  1 1/17 0746  HL 0.33  Therapeutic x2 but rate changed to 1150u/h,Hgb/plt stable. 1/17 1607 HL 0.29 Subtherapeutic, rate 1200u/hr 1/18 0420 HL 0.35 Therapeutic x 1 1/18 1230 HL 0.23 Subtherapeutic, rate 1300u/h 1/18 2202 HL 0.26 Subtherapeutic, rate 1450u/h 1/19 0831 HL 0.35 Therapeutic x 1 1/19 1618 HL 0.30 Therapeutic x 2  Plan:  Since on lower end of goal, will slightly increase heparin  infusion to 1650 units/hr Check heparin  level tomorrow morning with AM labs CBC once daily while on IV heparin   Sevin Langenbach A Jabrea Kallstrom, PharmD Clinical Pharmacist 01/31/2024 5:03 PM             [1] No Known Allergies

## 2024-01-31 NOTE — Consult Note (Signed)
 PHARMACY - ANTICOAGULATION CONSULT NOTE  Pharmacy Consult for IV Heparin  Indication: atrial fibrillation  Allergies[1]  Patient Measurements: Height: 6' 5 (195.6 cm) Weight: 109.4 kg (241 lb 2.9 oz) IBW/kg (Calculated) : 89.1 HEPARIN  DW (KG): 96.9  Labs: Recent Labs     0000 01/28/24 1400 01/29/24 0022 01/29/24 0441 01/29/24 0746 01/30/24 0420 01/30/24 0900 01/30/24 1230 01/30/24 2202 01/31/24 0549  HGB   < >  --   --  8.1*  --  8.2* 8.3*  --   --  8.3*  HCT   < >  --   --  26.3*  --  25.9* 26.4*  --   --  26.6*  PLT   < >  --   --  214  --  243 248  --   --  255  APTT  --  50* 72*  --   --   --   --   --   --   --   LABPROT  --  18.5*  --   --   --   --   --   --   --   --   INR  --  1.5*  --   --   --   --   --   --   --   --   HEPARINUNFRC  --  0.18*  --   --    < > 0.35  --  0.23* 0.26*  --   CREATININE  --   --   --  2.98*  --  3.59*  --   --   --   --    < > = values in this interval not displayed.    Estimated Creatinine Clearance: 21.1 mL/min (A) (by C-G formula based on SCr of 3.59 mg/dL (H)).   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 Source thought to be from splenic abscess now s/p drain placement. Patient suffered acute stroke as seen on MRI this admission. Pharmacy consulted to dose heparin  for Afib.  Patient's last dose of apixaban  was on 01/23/24  Goal of Therapy:  Heparin  level 0.3-0.7 units/ml Monitor platelets by anticoagulation protocol: Yes   Date Time   Results Comments 1/17 0022 aPTT 7 Therapeutic x 1 1/17 0746  HL 0.33  Therapeutic x2 but rate changed to 1150u/h,Hgb/plt stable. 1/17 1607 HL 0.29 Subtherapeutic, rate 1200u/hr 1/18 0420 HL 0.35 Therapeutic x 1 1/18 1230 HL 0.23 Subtherapeutic, rate 1300u/h 1/18 2202 HL 0.26 Subtherapeutic, rate 1450u/h  Plan: heparin  level therapeutic x 1 continue heparin  infusion at 1600 units/hr Check heparin  level in 8 hours. CBC once daily while on IV heparin .   Adriana Bolster, PharmD, BCPS 01/31/2024 7:53 AM           [1] No Known Allergies

## 2024-01-31 NOTE — Evaluation (Signed)
 Occupational Therapy Evaluation Patient Details Name: Jeffery Joseph MRN: [id number] DOB: Nov 02, 1940 Today's Date: 01/31/2024   History of Present Illness   Pt is an 84 y/o M admitted on 01/22/24 after presenting with c/o acute onset dyspnea & chest tightness. Pt admitted for observation, became hypotensive. CT concerning for splenic fluid collection/possible abscess, s/p aspiration & drain placement on 01/26/24. 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 Splenic abscess culture with klebsiella, blood cultur with enterococcus. PMH: ESRD on HD MWF, DM2, HTN, PAD, chronic a-fib, chronic anemia, anxiety, depression, BPH, dementia     Clinical Impressions Pt was seen for OT evaluation this date. PTA, pt resides in a one level home with his wife and daughter. He is typically a limited household ambulator using a RW and denies falls. Wife reports they assist him with bathing and he utilizes a [facility] for toileting needs. She states he can dress himself, although pt states they help him at times. Pt presents with deficits in strength, balance and activity tolerance limiting their ability to perform ADL management at baseline level. Pt currently requires supervision for bed mobility with mild dizziness that improves upon sitting. BP stable with MAP >60 throughout session. Mod A +2 progressing to Min A +2 for STS from EOB and step pivot transfer using RW from bed>recliner. Would recommend chair follow when attempting ambulation. RUE with noted weakness at 3+/5, pt is R hand dominant. Edema also noted in RUE, however increased ease when pushing up with RUE vs LUE on 2nd standing trial. Pt reports fatigue upon reaching chair and declined further standing attempts. He did demo LB dressing to doff/donn socks seated in recliner via figure four with CGA. Will follow acutely to promote return to PLOF. Do anticipate the need for follow up OT services upon acute hospital DC.      If plan is discharge home,  recommend the following:   A lot of help with bathing/dressing/bathroom;A lot of help with walking and/or transfers;Two people to help with walking and/or transfers;Assist for transportation;Help with stairs or ramp for entrance     Functional Status Assessment   Patient has had a recent decline in their functional status and demonstrates the ability to make significant improvements in function in a reasonable and predictable amount of time.     Equipment Recommendations   Other (comment) (defer to next venue)     Recommendations for Other Services         Precautions/Restrictions   Precautions Precautions: Fall Precaution/Restrictions Comments: L JP drain Restrictions Weight Bearing Restrictions Per Provider Order: No     Mobility Bed Mobility Overal bed mobility: Needs Assistance Bed Mobility: Supine to Sit     Supine to sit: Supervision     General bed mobility comments: exited L side of bed without assist; became dizzy initially but MAP >60 throughout session    Transfers Overall transfer level: Needs assistance Equipment used: Rolling walker (2 wheels) Transfers: Sit to/from Stand, Bed to chair/wheelchair/BSC Sit to Stand: Mod assist, Min assist, +2 physical assistance, +2 safety/equipment     Step pivot transfers: Min assist, +2 safety/equipment, +2 physical assistance     General transfer comment: intial stand with Mod A +2 d/t poor mechanics, repositioning and assisted with hand/feet placement and able to stand with Min A +2 and step pivot to recliner; increased trouble pushing up with LUE vs RUE      Balance Overall balance assessment: Needs assistance Sitting-balance support: Feet supported Sitting balance-Leahy Scale: Fair  Standing balance support: Bilateral upper extremity supported, During functional activity, Reliant on assistive device for balance Standing balance-Leahy Scale: Poor                             ADL  either performed or assessed with clinical judgement   ADL Overall ADL's : Needs assistance/impaired                     Lower Body Dressing: Minimal assistance;Sitting/lateral leans;Contact guard assist Lower Body Dressing Details (indicate cue type and reason): able to doff/donn sock seated in recliner via figure four Toilet Transfer: Minimal assistance;Rolling walker (2 wheels);+2 for physical assistance Toilet Transfer Details (indicate cue type and reason): simulated from bed>recliner during session with Min A +2                 Vision         Perception         Praxis         Pertinent Vitals/Pain Pain Assessment Pain Assessment: No/denies pain     Extremity/Trunk Assessment Upper Extremity Assessment Upper Extremity Assessment: Generalized weakness;Right hand dominant;RUE deficits/detail RUE Deficits / Details: edema noted per pt from IVs being removed; 3+/5 strength   Lower Extremity Assessment Lower Extremity Assessment: Generalized weakness       Communication Communication Communication: No apparent difficulties Factors Affecting Communication: Hearing impaired   Cognition Arousal: Alert Behavior During Therapy: WFL for tasks assessed/performed                                 Following commands: Impaired Following commands impaired: Follows one step commands with increased time, Only follows one step commands consistently     Cueing  General Comments   Cueing Techniques: Verbal cues  VSS MAP >60 throughout session with HR up to 113 while seated in recliner and sp02 91-95%   Exercises Other Exercises Other Exercises: Edu on role of OT in acute setting.   Shoulder Instructions      Home Living Family/patient expects to be discharged to:: Private residence Living Arrangements: Spouse/significant other;Children (daughter lives with them) Available Help at Discharge: Family;Available 24 hours/day Type of Home:  House Home Access: Stairs to enter [employer] of Steps: 2 STE, no railing; level entry on back entrance Entrance Stairs-Rails: None Home Layout: One level     Bathroom Shower/Tub: Producer, Television/film/video: Handicapped height Bathroom Accessibility: No   Home Equipment: Cane - single Librarian, Academic (2 wheels)          Prior Functioning/Environment Prior Level of Function : Needs assist       Physical Assist : ADLs (physical)   ADLs (physical): Bathing Mobility Comments: RW at home household distances, ~20 ft or so ADLs Comments: family assists with bathing, per wife pt can dress himself, uses [facility] for toileting    OT Problem List: Decreased strength;Decreased activity tolerance;Impaired balance (sitting and/or standing)   OT Treatment/Interventions: Self-care/ADL training;Therapeutic exercise;Therapeutic activities;Energy conservation;DME and/or AE instruction;Patient/family education;Balance training      OT Goals(Current goals can be found in the care plan section)   Acute Rehab OT Goals Patient Stated Goal: get better OT Goal Formulation: With patient/family Time For Goal Achievement: 02/14/24 Potential to Achieve Goals: Fair ADL Goals Pt Will Perform Grooming: with set-up;sitting Pt Will Perform Lower Body Dressing: sit to/from stand;sitting/lateral leans Pt  Will Transfer to Toilet: with contact guard assist;ambulating;bedside commode Pt/caregiver will Perform Home Exercise Program: Increased strength;Both right and left upper extremity;With Supervision;With written HEP provided   OT Frequency:  Min 2X/week    Co-evaluation PT/OT/SLP Co-Evaluation/Treatment: Yes Reason for Co-Treatment: Complexity of the patient's impairments (multi-system involvement);Necessary to address cognition/behavior during functional activity;To address functional/ADL transfers;For patient/therapist safety PT goals addressed during session: Mobility/safety  with mobility;Balance;Proper use of DME OT goals addressed during session: ADL's and self-care      AM-PAC OT 6 Clicks Daily Activity     Outcome Measure Help from another person eating meals?: A Little Help from another person taking care of personal grooming?: A Little Help from another person toileting, which includes using toliet, bedpan, or urinal?: A Lot Help from another person bathing (including washing, rinsing, drying)?: A Lot Help from another person to put on and taking off regular upper body clothing?: A Lot Help from another person to put on and taking off regular lower body clothing?: A Lot 6 Click Score: 14   End of Session Equipment Utilized During Treatment: Gait belt;Rolling walker (2 wheels) Nurse Communication: Mobility status  Activity Tolerance: Patient tolerated treatment well Patient left: in chair;with call bell/phone within reach;with chair alarm set  OT Visit Diagnosis: Other abnormalities of gait and mobility (R26.89);Muscle weakness (generalized) (M62.81);Unsteadiness on feet (R26.81)                Time: 8583-8554 OT Time Calculation (min): 29 min Charges:  OT General Charges $OT Visit: 1 Visit OT Evaluation $OT Eval Moderate Complexity: 1 Mod Kamaria Lucia Chrismon, OTR/L  01/31/24, 4:19 PM  Jaylynn Mcaleer E Chrismon 01/31/2024, 4:11 PM

## 2024-01-31 NOTE — Progress Notes (Signed)
 " [facility]  PROGRESS NOTE   Subjective:   84 year old male with end-stage renal disease, anxiety, depression, type 2 diabetes originally admitted on 01/22/2023 for complaints of dyspnea, chest tightness. Undergoing dialysis today when seen.   HEMODIALYSIS FLOWSHEET:  Blood Flow Rate (mL/min): 319 mL/min Arterial Pressure (mmHg): -144.03 mmHg Venous Pressure (mmHg): 204.84 mmHg TMP (mmHg): 15.96 mmHg Ultrafiltration Rate (mL/min): 676 mL/min Dialysate Flow Rate (mL/min): 299 ml/min   Tachycardic during dialysis but stable.  Denied any complaints.  Currently on heparin  and Levophed  drip. His biggest complaint today is constipation.  Objective:  Vital signs: Blood pressure (!) 121/54, pulse (!) 111, temperature 97.6 F (36.4 C), temperature source Oral, resp. rate 19, height 6' 5 (1.956 m), weight 109.2 kg, SpO2 100%.  Intake/Output Summary (Last 24 hours) at 01/31/2024 1137 Last data filed at 01/31/2024 1000 Gross per 24 hour  Intake 1724.55 ml  Output 800 ml  Net 924.55 ml   Filed Weights   01/30/24 0701 01/31/24 0500 01/31/24 0815  Weight: 107.3 kg 109.4 kg 109.2 kg     Physical Exam: General:  No acute distress  Head:  Normocephalic, atraumatic. Moist oral mucosal membranes  Eyes:  Anicteric  Neck:  Supple  Lungs:   Clear to auscultation, normal effort  Heart:  S1S2 no rubs  Abdomen:   Soft, nontender, bowel sounds present  Extremities: Trace peripheral edema.  Neurologic:  Awake, alert, following commands  Skin:  No lesions  Access: Left upper arm AV fistula.    Basic Metabolic Panel: Recent Labs  Lab 01/25/24 0330 01/26/24 0327 01/27/24 0505 01/28/24 0425 01/29/24 0441 01/30/24 0420 01/31/24 0823  NA 130* 128* 132* 130* 132* 129* 128*  K 3.3* 3.5 3.8 4.0 4.2 4.1 4.4  CL 93* 93* 94* 93* 95* 93* 92*  CO2 27 26 27 26 25 25 24   GLUCOSE 147* 137* 135* 146* 164* 205* 189*  BUN 35* 39* 26* 30* 26* 39* 56*  CREATININE 3.62* 3.77* 2.97* 3.53*  2.98* 3.59* 4.08*  CALCIUM  8.3* 8.3* 8.8* 8.7* 8.7* 9.2 9.0  MG 1.9 1.8 1.9 2.2 2.2  --   --   PHOS 2.7 3.2 3.1 3.8  --   --  4.7*   GFR: Estimated Creatinine Clearance: 18.5 mL/min (A) (by C-G formula based on SCr of 4.08 mg/dL (H)).  Liver Function Tests: Recent Labs  Lab 01/24/24 1554 01/25/24 0330 01/27/24 0505 01/31/24 0823  AST  --  22  --   --   ALT  --  33  --   --   ALKPHOS  --  64  --   --   BILITOT  --  0.7  --   --   PROT  --  6.5  --   --   ALBUMIN  2.7* 2.6* 2.6* 3.1*   No results for input(s): LIPASE, AMYLASE in the last 168 hours. No results for input(s): AMMONIA in the last 168 hours.  CBC: Recent Labs  Lab 01/25/24 1309 01/26/24 0327 01/28/24 0425 01/29/24 0441 01/30/24 0420 01/30/24 0900 01/31/24 0549  WBC 11.3*   < > 9.0 8.7 12.3* 12.9* 10.7*  NEUTROABS 8.8*  --   --   --   --   --   --   HGB 9.1*   < > 8.8* 8.1* 8.2* 8.3* 8.3*  HCT 28.6*   < > 28.1* 26.3* 25.9* 26.4* 26.6*  MCV 77.3*   < > 79.2* 79.5* 78.5* 79.3* 79.2*  PLT 202   < >  229 214 243 248 255   < > = values in this interval not displayed.     HbA1C: Hgb A1c MFr Bld  Date/Time Value Ref Range Status  01/23/2024 04:30 AM 5.9 (H) 4.8 - 5.6 % Final    Comment:    (NOTE) Diagnosis of Diabetes The following HbA1c ranges recommended by the American Diabetes Association (ADA) may be used as an aid in the diagnosis of diabetes mellitus.  Hemoglobin             Suggested A1C NGSP%              Diagnosis  <5.7                   Non Diabetic  5.7-6.4                Pre-Diabetic  >6.4                   Diabetic  <7.0                   Glycemic control for                       adults with diabetes.    01/23/2024 03:23 AM 5.7 (H) 4.8 - 5.6 % Final    Comment:    (NOTE) Diagnosis of Diabetes The following HbA1c ranges recommended by the American Diabetes Association (ADA) may be used as an aid in the diagnosis of diabetes mellitus.  Hemoglobin              Suggested A1C NGSP%              Diagnosis  <5.7                   Non Diabetic  5.7-6.4                Pre-Diabetic  >6.4                   Diabetic  <7.0                   Glycemic control for                       adults with diabetes.      Urinalysis: No results for input(s): COLORURINE, LABSPEC, PHURINE, GLUCOSEU, HGBUR, BILIRUBINUR, KETONESUR, PROTEINUR, UROBILINOGEN, NITRITE, LEUKOCYTESUR in the last 72 hours.  Invalid input(s): APPERANCEUR    Imaging: DG Chest Port 1 View Result Date: 01/30/2024 EXAM: 1 VIEW(S) XRAY OF THE CHEST 01/30/2024 01:55:51 PM COMPARISON: 01/27/2024 CLINICAL HISTORY: Infiltrate of both lungs present on imaging study. FINDINGS: LINES, TUBES AND DEVICES: Left IJ central venous catheter in place with tip overlying proximal SVC. Left axillary vascular stent noted. LUNGS AND PLEURA: Low lung volume. Mild-to-moderate pulmonary edema. Hazy opacity at left lung base, decreased from prior study. Probable associated atelectasis at left lung base. Small left pleural effusion. No pneumothorax. HEART AND MEDIASTINUM: Stable mild cardiomegaly. Aortic arch calcifications. BONES AND SOFT TISSUES: No acute osseous abnormality. IMPRESSION: 1. Mild-to-moderate pulmonary edema. 2. Decreased hazy opacity at the left lung base, likely atelectasis. 3. Small left pleural effusion. Electronically signed by: Lonni Necessary MD 01/30/2024 06:41 PM EST RP Workstation: [id number]     Medications:    ampicillin  (OMNIPEN) IV 2 g (01/31/24 1019)   cefTRIAXone  (ROCEPHIN )  IV 200 mL/hr at 01/31/24 9044  dexmedetomidine  (PRECEDEX ) IV infusion Stopped (01/27/24 2149)   heparin  1,600 Units/hr (01/31/24 0955)   norepinephrine  (LEVOPHED ) Adult infusion 4 mcg/min (01/31/24 0955)    amiodarone   100 mg Oral Daily   Chlorhexidine  Gluconate Cloth  6 each Topical Daily   cholecalciferol   5,000 Units Oral Daily   diclofenac  Sodium  2 g Topical QID   donepezil    10 mg Oral QHS   feeding supplement  237 mL Oral BID BM   finasteride   5 mg Oral Daily   hydrocortisone  sod succinate (SOLU-CORTEF ) inj  50 mg Intravenous Q6H   insulin  aspart  0-15 Units Subcutaneous TID WC   insulin  aspart  0-5 Units Subcutaneous QHS   midodrine   15 mg Oral TID WC   pantoprazole   40 mg Oral Daily   senna-docusate  2 tablet Oral q AM   sodium chloride   1 g Oral TID WC   tamsulosin   0.4 mg Oral Daily    Assessment/ Plan:     84 year old male with history of hypertension, coronary artery disease, atrial fibrillation, diabetes, anemia, anxiety/depression, BPH and end-stage renal disease on hemodialysis Monday Wednesday Friday schedule now admitted with history of chest tightness and shortness of breath.   #1: ESRD: Continue dialysis schedule Monday Wednesday and Friday.  Pressor support as needed.   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   #3: Secondary parathyroidism: Will monitor calcium , phosphorus and PTH.   #4: Sepsis: Patient has Enterococcus bacteremia.  Patient also has splenic abscess.  Presently on ampicillin  and Rocephin .   #5: Hypotension: Managed with Levophed  and midodrine .  Labs and medications reviewed. Will continue to follow along with you.   LOS: 8 Saralee Stank, MD [facility] 1/19/202611:37 AM  "

## 2024-01-31 NOTE — Plan of Care (Signed)
" °  Problem: Coping: Goal: Ability to adjust to condition or change in health will improve Outcome: Progressing   Problem: Health Behavior/Discharge Planning: Goal: Ability to identify and utilize available resources and services will improve Outcome: Progressing   Problem: Nutritional: Goal: Maintenance of adequate nutrition will improve Outcome: Progressing   Problem: Education: Goal: Knowledge of General Education information will improve Description: Including pain rating scale, medication(s)/side effects and non-pharmacologic comfort measures Outcome: Progressing   Problem: Clinical Measurements: Goal: Respiratory complications will improve Outcome: Progressing   Problem: Coping: Goal: Level of anxiety will decrease Outcome: Progressing   Problem: Elimination: Goal: Will not experience complications related to urinary retention Outcome: Progressing   Problem: Pain Managment: Goal: General experience of comfort will improve and/or be controlled Outcome: Progressing   Problem: Safety: Goal: Ability to remain free from injury will improve Outcome: Progressing   "

## 2024-01-31 NOTE — Progress Notes (Signed)
 "   Referring Provider(s): Jeffery Platt, PA-C  Supervising Physician: Jeffery Joseph  Patient Status:  [facility] - In-pt  Chief Complaint: Splenic Fluid collection s/p drain placement on 01/26/24  Brief History:  84 year old male with a history of dementia on Aricept , ESRD on HD, DM, HTN, A-fib on Eliquis , chronic anemia, and recent flu infection who presented to the ED on 1/10 with complaints of worsening shortness of breath and weakness despite having previously taken Tamiflu . He was admitted for functional decline and chest pain in the setting of elevated Troponin. Overnight on 1/11, patient was noted to be increasingly lethargic and hypotensive. He was started on IV levophed  and transferred to the ICU where he has remained on pressors. WBC minimally elevated to 11.3 on 1/13 and blood cultures from 1/11 positive for enterococcus faecalis. Due to infection concern patient underwent CT A/P on 1/13 which revealed concern for possible splenic abscess. IR consulted and patient underwent drain placement on 1/14.   Subjective:  Patient resting in bed with daughter at bedside. He continues to report nausea, but denies any additional abdominal pain or pain around splenic drain site. Denies any recent fevers/chills.   Allergies: Patient has no known allergies.  Medications: Prior to Admission medications  Medication Sig Start Date End Date Taking? Authorizing Provider  acetaminophen  (TYLENOL ) 325 MG tablet Take 650 mg by mouth every 6 (six) hours as needed.   Yes [provider]  ALPRAZolam  (XANAX ) 0.5 MG tablet Take 0.5 mg by mouth 3 (three) times daily as needed for sleep. 02/05/21  Yes [provider]  amiodarone  (PACERONE ) 200 MG tablet Take 2 tablets (400 mg total) by mouth 2 (two) times daily for 7 days, THEN 1 tablet (200 mg total) daily. 07/24/23 01/22/24 Yes Sreenath, Sudheer B, MD  apixaban  (ELIQUIS ) 2.5 MG TABS tablet Take 1 tablet (2.5 mg total) by mouth 2 (two) times  daily. 07/24/23 01/23/24 Yes Sreenath, Sudheer B, MD  Cholecalciferol  125 MCG (5000 UT) TABS Take 5,000 Units by mouth daily.   Yes [provider]  clopidogrel  (PLAVIX ) 75 MG tablet Take 1 tablet (75 mg total) by mouth daily. 06/09/23  Yes Schnier, Cordella MATSU, MD  diclofenac  Sodium (VOLTAREN ) 1 % GEL Apply 2 g topically 4 (four) times daily. 07/24/23  Yes Sreenath, Sudheer B, MD  Docusate Sodium  (DSS) 100 MG CAPS Take 100 mg by mouth at bedtime.   Yes [provider]  donepezil  (ARICEPT ) 10 MG tablet Take 10 mg by mouth at bedtime. 05/14/21  Yes [provider]  finasteride  (PROSCAR ) 5 MG tablet Take 1 tablet (5 mg total) by mouth daily. 08/12/19  Yes Stoioff, Glendia BROCKS, MD  lidocaine -prilocaine  (EMLA ) cream Apply 1 Application topically as needed (Dialysis). 08/18/23  Yes [provider]  metoprolol  succinate (TOPROL -XL) 25 MG 24 hr tablet Take 0.5 tablets (12.5 mg total) by mouth daily. 07/24/23 01/22/24 Yes Sreenath, Sudheer B, MD  tamsulosin  (FLOMAX ) 0.4 MG CAPS capsule Take 1 capsule (0.4 mg total) by mouth daily. 08/12/19  Yes Stoioff, Glendia BROCKS, MD    Vital Signs: BP (!) 107/44   Pulse (!) 111   Temp 97.7 F (36.5 C) (Oral)   Resp 14   Ht 6' 5 (1.956 m)   Wt 238 lb 8.6 oz (108.2 kg)   SpO2 99%   BMI 28.29 kg/m    Physical Exam Vitals reviewed.  Constitutional:      Appearance: Normal appearance.  Cardiovascular:     Rate and Rhythm: Tachycardia present.  Pulmonary:     Effort: Pulmonary effort is normal.  Abdominal:     General: Abdomen is flat.     Palpations: Abdomen is soft.     Tenderness: There is no abdominal tenderness.     Comments: Left flank drain sutured and stat locked in place. Insertion site is non-erythematous. Surrounding area is non-tender to palpation. Bulb is to suction with scant serosanguinous fluid in bulb  Skin:    General: Skin is warm and dry.  Neurological:     Mental Status: He is alert. Mental status is at baseline.     Drain Location: Left Flank Drain  Size: Fr size: 12 Fr Date of placement: 01/26/24  Currently to: Drain collection device: suction bulb 24 hour output:  Output by Drain (mL) 01/29/24 0701 - 01/29/24 1900 01/29/24 1901 - 01/30/24 0700 01/30/24 0701 - 01/30/24 1900 01/30/24 1901 - 01/31/24 0700 01/31/24 0701 - 01/31/24 1900 01/31/24 1901 - 01/31/24 2054  Requested LDAs do not have output data documented.    Interval imaging/drain manipulation:  None  Current examination: Insertion site unremarkable. Suture and stat lock in place. Dressed appropriately.    Labs:  CBC: Recent Labs    01/29/24 0441 01/30/24 0420 01/30/24 0900 01/31/24 0549  WBC 8.7 12.3* 12.9* 10.7*  HGB 8.1* 8.2* 8.3* 8.3*  HCT 26.3* 25.9* 26.4* 26.6*  PLT 214 243 248 255    COAGS: Recent Labs    01/23/24 0343 01/23/24 1859 01/24/24 0317 01/24/24 2318 01/25/24 0802 01/26/24 0949 01/28/24 1400 01/29/24 0022  INR 2.1* 2.2*  --   --   --  1.6* 1.5*  --   APTT 48* 49*   < > 71* 79*  --  50* 72*   < > = values in this interval not displayed.    BMP: Recent Labs    01/28/24 0425 01/29/24 0441 01/30/24 0420 01/31/24 0823  NA 130* 132* 129* 128*  K 4.0 4.2 4.1 4.4  CL 93* 95* 93* 92*  CO2 26 25 25 24   GLUCOSE 146* 164* 205* 189*  BUN 30* 26* 39* 56*  CALCIUM  8.7* 8.7* 9.2 9.0  CREATININE 3.53* 2.98* 3.59* 4.08*  GFRNONAA 16* 20* 16* 14*    LIVER FUNCTION TESTS: Recent Labs    07/17/23 2042 07/18/23 0342 01/22/24 1745 01/24/24 0317 01/24/24 1554 01/25/24 0330 01/27/24 0505 01/31/24 0823  BILITOT 1.0  --  0.5 0.9  --  0.7  --   --   AST 23  --  73* 34  --  22  --   --   ALT 35  --  70* 48*  --  33  --   --   ALKPHOS 54  --  86 66  --  64  --   --   PROT 6.6  --  7.6 6.7  --  6.5  --   --   ALBUMIN  3.2*   < > 3.1* 2.7* 2.7* 2.6* 2.6* 3.1*   < > = values in this interval not displayed.    Assessment and Plan:  Splenic Fluid Collection: Jeffery Joseph is a 84 y.o. male with  a history of dementia, ESRD on HD, T2DM, chronic anemia, A-fib on Eliquis , and recent flu infection who was admitted to the ED for functional decline and chest pain with elevated troponin likely secondary to demand ischemia. Noted to have rising WBC count (minimal at 11.8) and positive blood cultures. Imaging demonstrating for splenic fluid collection and IR consulted for drain  placement which patient underwent on 1/14.   -Exam today with scant serosanguinous drainage in bulb to suction -Patient remains afebrile and without pain around drain site -Mild leukocytosis noted since Friday; suspect secondary to the onset of steroids for suspected adrenal insufficiency and no concern for acute infection -No output out in the past 48hrs with only scant output the day prior -Case discussed with Dr. JONETTA Faes who is agreeable to drain removal at the bedside today -Drain removed at the bedside without difficulty; patient tolerated well. Clean dressing placed over insertion site. Discussed after care with daughter at bedside who verbalized understanding.  IR will sign off. Please feel free to reach out to our service with any additional questions or concerns.  Thank you for allowing our service to participate in Jeffery Joseph 's care.   Electronically Signed: Dorothee Napierkowski M Darla Mcdonald, PA-C 01/31/2024, 5:45 PM    I spent a total of 25 Minutes at the the patient's bedside AND on the patient's hospital floor or unit, greater than 50% of which was counseling/coordinating care for drain care follow up. "

## 2024-01-31 NOTE — Evaluation (Signed)
 Physical Therapy Evaluation Patient Details Name: Jeffery Joseph MRN: [id number] DOB: September 15, 1940 Today's Date: 01/31/2024  History of Present Illness  Pt is an 84 y/o M admitted on 01/22/24 after presenting with c/o acute onset dyspnea & chest tightness. Pt admitted for observation, became hypotensive. CT concerning for splenic fluid collection/possible abscess, s/p aspiration & drain placement on 01/26/24. 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 Splenic abscess culture with klebsiella, blood cultur with enterococcus. PMH: ESRD on HD MWF, DM2, HTN, PAD, chronic a-fib, chronic anemia, anxiety, depression, BPH, dementia  Clinical Impression  Pt seen for PT evaluation with co-tx with OT. Prior to admission pt ambulated short distances with RW, lived with wife. On this date, pt is able to complete bed mobility with supervision, requires +2 for safety with sit>stand with improvement on 2nd attempt, even progressing to recliner via step pivot. Recommend ongoing PT services to progress mobility as able. Recommend post acute rehab <3 hours therapy/day upon d/c.      If plan is discharge home, recommend the following: Two people to help with walking and/or transfers;Two people to help with bathing/dressing/bathroom;Assistance with feeding;Assistance with cooking/housework   Can travel by private vehicle   No    Equipment Recommendations None recommended by PT (defer to next venue)  Recommendations for Other Services       Functional Status Assessment Patient has had a recent decline in their functional status and demonstrates the ability to make significant improvements in function in a reasonable and predictable amount of time.     Precautions / Restrictions Precautions Precautions: Fall Precaution/Restrictions Comments: L JP drain Restrictions Weight Bearing Restrictions Per Provider Order: No      Mobility  Bed Mobility Overal bed mobility: Needs Assistance Bed Mobility:  Supine to Sit     Supine to sit: Supervision (exit L side of bed)          Transfers Overall transfer level: Needs assistance Equipment used: Rolling walker (2 wheels) Transfers: Sit to/from Stand, Bed to chair/wheelchair/BSC Sit to Stand: Mod assist, Min assist, +2 physical assistance, +2 safety/equipment   Step pivot transfers: Min assist, +2 safety/equipment, +2 physical assistance       General transfer comment: sit>stand x 2 from EOB with cuing re: hand placement, pt does better at powering up to standing when pushing with RUE vs LUE    Ambulation/Gait                  Stairs            Wheelchair Mobility     Tilt Bed    Modified Rankin (Stroke Patients Only)       Balance Overall balance assessment: Needs assistance Sitting-balance support: Feet supported Sitting balance-Leahy Scale: Fair Sitting balance - Comments: close supervision   Standing balance support: Bilateral upper extremity supported, During functional activity, Reliant on assistive device for balance Standing balance-Leahy Scale: Poor                               Pertinent Vitals/Pain Pain Assessment Pain Assessment: No/denies pain    Home Living Family/patient expects to be discharged to:: Private residence Living Arrangements: Spouse/significant other;Children (daughter lives with them) Available Help at Discharge: Family;Available 24 hours/day Type of Home: House Home Access: Stairs to enter Entrance Stairs-Rails: None Entrance Stairs-Number of Steps: 2 STE, no railing; level entry on back entrance   Home Layout: One level Home Equipment: [place] -  single point;Rolling Walker (2 wheels)      Prior Function Prior Level of Function : Needs assist       Physical Assist : ADLs (physical)   ADLs (physical): Bathing Mobility Comments: RW at home household distances, ~20 ft or so ADLs Comments: family assists with bathing, per wife pt can dress himself,  uses [facility] for toileting     Extremity/Trunk Assessment   Upper Extremity Assessment Upper Extremity Assessment: Generalized weakness (BUE edema)    Lower Extremity Assessment Lower Extremity Assessment: Generalized weakness       Communication   Communication Communication: No apparent difficulties Factors Affecting Communication: Hearing impaired    Cognition Arousal: Alert Behavior During Therapy: WFL for tasks assessed/performed   PT - Cognitive impairments: History of cognitive impairments, Problem solving, Awareness, Safety/Judgement, Memory                       PT - Cognition Comments: per chart, pt with hx of dementia, reports he's unaware of MRI results Following commands: Impaired Following commands impaired: Follows one step commands with increased time, Only follows one step commands consistently     Cueing Cueing Techniques: Verbal cues     General Comments General comments (skin integrity, edema, etc.): VSS (MAP >60 throughout session)    Exercises     Assessment/Plan    PT Assessment Patient needs continued PT services  PT Problem List Decreased strength;Decreased coordination;Cardiopulmonary status limiting activity;Decreased range of motion;Decreased balance;Decreased activity tolerance;Decreased mobility;Decreased knowledge of use of DME;Decreased safety awareness;Decreased knowledge of precautions;Decreased cognition       PT Treatment Interventions DME instruction;Therapeutic exercise;Gait training;Balance training;Stair training;Neuromuscular re-education;Functional mobility training;Cognitive remediation;Therapeutic activities;Patient/family education;Modalities    PT Goals (Current goals can be found in the Care Plan section)  Acute Rehab PT Goals Patient Stated Goal: get better PT Goal Formulation: With patient Time For Goal Achievement: 02/14/24 Potential to Achieve Goals: Fair    Frequency Min 2X/week     Co-evaluation  PT/OT/SLP Co-Evaluation/Treatment: Yes Reason for Co-Treatment: Complexity of the patient's impairments (multi-system involvement);Necessary to address cognition/behavior during functional activity;To address functional/ADL transfers;For patient/therapist safety PT goals addressed during session: Mobility/safety with mobility;Balance;Proper use of DME         AM-PAC PT 6 Clicks Mobility  Outcome Measure Help needed turning from your back to your side while in a flat bed without using bedrails?: None Help needed moving from lying on your back to sitting on the side of a flat bed without using bedrails?: A Little Help needed moving to and from a bed to a chair (including a wheelchair)?: A Lot Help needed standing up from a chair using your arms (e.g., wheelchair or bedside chair)?: A Lot Help needed to walk in hospital room?: Total Help needed climbing 3-5 steps with a railing? : Total 6 Click Score: 13    End of Session   Activity Tolerance: Patient tolerated treatment well Patient left: in chair;with chair alarm set;with call bell/phone within reach Nurse Communication: Mobility status PT Visit Diagnosis: Unsteadiness on feet (R26.81);Difficulty in walking, not elsewhere classified (R26.2);Other abnormalities of gait and mobility (R26.89);Muscle weakness (generalized) (M62.81)    Time: 8578-8558 PT Time Calculation (min) (ACUTE ONLY): 20 min   Charges:   PT Evaluation $PT Eval Moderate Complexity: 1 Mod   PT General Charges $$ ACUTE PT VISIT: 1 Visit         Richerd Pinal, PT, DPT 01/31/24, 3:04 PM   Richerd CHRISTELLA Pinal 01/31/2024, 3:01 PM

## 2024-01-31 NOTE — Progress Notes (Signed)
 "  NAME:  Jeffery Joseph, MRN:  [id number], DOB:  15-Apr-1940, LOS: 8 ADMISSION DATE:  01/22/2024, CONSULTATION DATE: 01/24/2024 REFERRING MD: Dr. 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 Symptoms began several days prior to presentation. Patient was taking oseltamivir  for possible influenza. He reported dry cough and denied nausea, vomiting, abdominal pain, diarrhea, melena, or hematochezia. Last hemodialysis session was on Friday prior to admission.  ED Course: On arrival, vital signs: BP 106/49 mmHg, HR 111 bpm, RR 20, T 44F, SpO? 100% on room air. Initial labs showed WBC 11.8 K/L, Hgb 8.0 g/dL, platelets 790 K/L, sodium 134 mmol/L, potassium 3.5 mmol/L, BUN 46 mg/dL, creatinine 4.7 mg/dL, lactate 2.2 mmol/L, AST 73 U/L, ALT 70 U/L. Chest X-ray demonstrated cardiomegaly without acute consolidation. CT chest without contrast showed cardiomegaly and no acute airspace disease. Patient received 500 mL IV normal saline bolus, famotidine , and antacids and was admitted to TRH service for observation.   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), T2DM, HTN, PAD, chronic atrial fibrillation on anticoagulation, chronic anemia, anxiety/depression, and BPH   Micro Data  COVID/Influenza A&B/RSV 01/11>>negative  RVP 01/11>>negative  Blood x2 01/11>>enterococcus Faecalis  MRSA PCR 01/12>>negative  Tracheal aspirate 01/13>>  Significant Hospital Events: Including procedures, antibiotic start and stop dates in addition to other pertinent events   1/11: Admitted to trh service 1/12: Became hypotensive  started on levophed , critical care consulted 1/13: Pt continues to require levophed  gtt complains of his stomach not feeling good CT   Abd Pelvis pending concerning for splenic fluid collection concerning for possible abscess, Surgery and IR consulted, recommends aspiration and drain placement.  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 1/14: No acute events overnight, afebrile, remains on Levophed  (weaned to 6 mcg).  More awake and alert, consult Neurology for acute CVA on MRI yesterday.  Tentative plan for IR to perform splenic aspiration/drain today.  Cardiology tentatively planning for TEE tomorrow. 01/27/24- patient with sepsis bacteremia, for TEE today.   Had splenic drain (cultures with GPC+) and dialysis yesterday. ID on case is on ampicillin .  Concern for AV graft fistula contamination.   01/28/24- less output from splenic drain this am, making some urine and gets dialysis.  He's on levophed  14mcg. Starting solucortef for suspected arenal insufficiency.   01/30/24- patient is on levophed  weaned from 12 to 9 this morning.  Mental status is with intermittent confusion.  Cbc with stable leukocytosis and chornic anemia, abscess culture with klebsiella , blood culture with enterococcus.  Treated with ampicillin /rocephin .  Family at bedside today, declined IV fluids post discussion of medical plan. Remains on midodrine  15tid and levophed . Poor prognosis DNR/DNI working with palliative.  01/31/24- patient reports constipation.  He is hypotensive on levophed  and is on day 4 abx with ampicillin  and rocephin  BID.   Interim History / Subjective:  As outlined above under significant events   Objective    Blood pressure (!) 108/52, pulse (!) 109, temperature 97.6 F (36.4 C), temperature source Oral, resp. rate 20, height 6' 5 (1.956 m), weight 109.4 kg, SpO2 100%. CVP:  [8 mmHg-17 mmHg] 16 mmHg  Intake/Output Summary (Last 24 hours) at 01/31/2024 0809 Last data filed at  01/31/2024 0500 Gross per 24 hour  Intake 1558.81 ml  Output 800 ml  Net 758.81 ml   Filed Weights   01/29/24 0448 01/30/24 0701 01/31/24 0500  Weight: 102.6 kg 107.3 kg 109.4 kg    Examination: General: Acute on chronically-ill appearing male, NAD on RA  HENT: Supple, no JVD  Lungs: Diminished throughout, even, non labored  Cardiovascular: Sinus tachycardia, no m/r/g, 2+ radial/1+, trace generalized edema Abdomen: Hypoactive BS x4, obese, taut, non tender  Extremities: Normal bulk and tone, moves all extremities  Neuro: Alert and oriented x1, follows commands, PERRLA GU: Deferred   Specimen Description ABSCESS Performed at [facility], [address] [place], [place] [address]  Special Requests Normal WD Performed at [facility], [address] [place], [place] [address]  Gram Stain MODERATE WBC PRESENT, PREDOMINANTLY PMN MODERATE GRAM POSITIVE COCCI Performed at [facility], [address], [place], [place] [address]  Culture ABUNDANT KLEBSIELLA PNEUMONIAE ABUNDANT ENTEROCOCCUS FAECALIS NO ANAEROBES ISOLATED; CULTURE IN PROGRESS FOR 5 DAYS  Report Status PENDING  Organism ID, Bacteria KLEBSIELLA PNEUMONIAE    Blood Culture ID Panel (Reflexed) Order: [id number]  Status: Final result     Next appt: 04/20/2024 at 08:30 AM in Vascular Surgery ([facility] VASC 3)   Test Result Released: Yes (not seen)   0 Result Notes    Component Ref Range & Units (hover) 6 d ago  Enterococcus faecalis DETECTED Abnormal   Comment: CRITICAL RESULT CALLED TO, READ BACK BY AND VERIFIED WITH: MAYA CHARLENA NAPOLEON [phone] @ 2318 FH     Assessment and Plan   #Acute CVA #Dementia #Depression  MRI Brain 01/25/24: 5 mm acute infarct at the left temporooccipital junction: Mild chronic small vessel ischemic disease. -Treatment of metabolic derangements as outlined above -Provide supportive care -Promote normal sleep/wake cycle and family presence -Avoid sedating  medications as able -Neurology consulted, appreciate input  -Continue outpatient donepezil  and prn xanax    #Shock: Septic and hypovolemic -present on admission - due to bacteremia and spelnic abscess- on ampiccillin and rocephin  . ID on case appreciate input Dr Fayette           Lactate normal 01/29/24 , mentation with baseline dementia and acute critical illness acceptable with GCS12 -s/p PCD and IV abx, may need splenectomy if fails #Elevated troponin due to demand ischemia  #Chronic atrial flutter/fibrillation   #Acute on chronic HFpEF  Hx: HTN and PAD  -Echocardiogram 01/25/24: LVEF 45 to 50%, grade 2 diastolic dysfunction, RV systolic function is low normal, RV size mildly enlarged mild to moderate mitral valve regurgitation, mild to moderate tricuspid valve regurgitation, mild aortic valve regurgitation, mild aortic valve stenosis -Continuous cardiac monitoring -Maintain MAP >65 -Cautious IV fluids -Vasopressors as needed to maintain MAP goal -Continue Midodrine   -Trend lactic acid until normalized -HS Troponin peaked at 789 -Volume removal with dialysis -Cardiology following, appreciate inpnt -Continue scheduled amiodarone  and finasteride  -Holding Anticoagulation for now given pending IR aspiration of splenic abscess and new CVA  #Previous influenza infection  -repeat cxr  #Pulmonary edema  - Supplemental O2 for dyspnea and/or hypoxia - Maintain O2 sats 92% or higher  - Prn CXR's  - Fluid removal as tolerated during HD   #ESRD on HD  #Hypokalemia  #Hyponatremia  #Hypomagnesia  - Trend BMP  - Replace electrolytes as indicated  - Strict I&O's - Nephrology consulted appreciate input: HD per recommendations   #  Anemia of chronic kidney disease  - Trend CBC  - Monitor for s/sx of bleeding  - Transfuse for hgb <7  Type II diabetes mellitus  - CBG's ac/hs - SSI  - Target CBG readings 140 to 180 - Follow hypo/hyperglycemic protocol     Labs   CBC: Recent Labs   Lab 01/25/24 1309 01/26/24 0327 01/28/24 0425 01/29/24 0441 01/30/24 0420 01/30/24 0900 01/31/24 0549  WBC 11.3*   < > 9.0 8.7 12.3* 12.9* 10.7*  NEUTROABS 8.8*  --   --   --   --   --   --   HGB 9.1*   < > 8.8* 8.1* 8.2* 8.3* 8.3*  HCT 28.6*   < > 28.1* 26.3* 25.9* 26.4* 26.6*  MCV 77.3*   < > 79.2* 79.5* 78.5* 79.3* 79.2*  PLT 202   < > 229 214 243 248 255   < > = values in this interval not displayed.    Basic Metabolic Panel: Recent Labs  Lab 01/24/24 1554 01/25/24 0330 01/26/24 0327 01/27/24 0505 01/28/24 0425 01/29/24 0441 01/30/24 0420  NA 132* 130* 128* 132* 130* 132* 129*  K 3.6 3.3* 3.5 3.8 4.0 4.2 4.1  CL 94* 93* 93* 94* 93* 95* 93*  CO2 27 27 26 27 26 25 25   GLUCOSE 173* 147* 137* 135* 146* 164* 205*  BUN 31* 35* 39* 26* 30* 26* 39*  CREATININE 3.37* 3.62* 3.77* 2.97* 3.53* 2.98* 3.59*  CALCIUM  8.3* 8.3* 8.3* 8.8* 8.7* 8.7* 9.2  MG  --  1.9 1.8 1.9 2.2 2.2  --   PHOS 2.5 2.7 3.2 3.1 3.8  --   --    GFR: Estimated Creatinine Clearance: 21.1 mL/min (A) (by C-G formula based on SCr of 3.59 mg/dL (H)). Recent Labs  Lab 01/24/24 2318 01/25/24 0330 01/25/24 1158 01/25/24 1309 01/29/24 0441 01/29/24 1148 01/30/24 0420 01/30/24 0900 01/31/24 0549  WBC  --    < >  --    < > 8.7  --  12.3* 12.9* 10.7*  LATICACIDVEN 1.5  --  2.1*  --   --  1.0  --   --   --    < > = values in this interval not displayed.    Liver Function Tests: Recent Labs  Lab 01/24/24 1554 01/25/24 0330 01/27/24 0505  AST  --  22  --   ALT  --  33  --   ALKPHOS  --  64  --   BILITOT  --  0.7  --   PROT  --  6.5  --   ALBUMIN  2.7* 2.6* 2.6*   No results for input(s): LIPASE, AMYLASE in the last 168 hours. No results for input(s): AMMONIA in the last 168 hours.  ABG    Component Value Date/Time   HCO3 27.2 01/23/2024 2300   TCO2 17 (L) 11/14/2021 0718   O2SAT 98.3 01/29/2024 1255     Coagulation Profile: Recent Labs  Lab 01/26/24 0949 01/28/24 1400  INR  1.6* 1.5*    Cardiac Enzymes: No results for input(s): CKTOTAL, CKMB, CKMBINDEX, TROPONINI in the last 168 hours.  HbA1C: Hgb A1c MFr Bld  Date/Time Value Ref Range Status  01/23/2024 04:30 AM 5.9 (H) 4.8 - 5.6 % Final    Comment:    (NOTE) Diagnosis of Diabetes The following HbA1c ranges recommended by the American Diabetes Association (ADA) may be used as an aid in the diagnosis of diabetes mellitus.  Hemoglobin  Suggested A1C NGSP%              Diagnosis  <5.7                   Non Diabetic  5.7-6.4                Pre-Diabetic  >6.4                   Diabetic  <7.0                   Glycemic control for                       adults with diabetes.    01/23/2024 03:23 AM 5.7 (H) 4.8 - 5.6 % Final    Comment:    (NOTE) Diagnosis of Diabetes The following HbA1c ranges recommended by the American Diabetes Association (ADA) may be used as an aid in the diagnosis of diabetes mellitus.  Hemoglobin             Suggested A1C NGSP%              Diagnosis  <5.7                   Non Diabetic  5.7-6.4                Pre-Diabetic  >6.4                   Diabetic  <7.0                   Glycemic control for                       adults with diabetes.      CBG: Recent Labs  Lab 01/30/24 1228 01/30/24 1519 01/30/24 2109 01/30/24 2237 01/31/24 0722  GLUCAP 159* 179* 180* 159* 165*    Review of Systems: Positives in BOLD   Gen: Denies fever, chills, weight change, fatigue, night sweats HEENT: Denies blurred vision, double vision, hearing loss, tinnitus, sinus congestion, rhinorrhea, sore throat, neck stiffness, dysphagia PULM: Denies shortness of breath, cough, sputum production, hemoptysis, wheezing CV: Denies chest pain, edema, orthopnea, paroxysmal nocturnal dyspnea, palpitations GI: abdominal discomfort, nausea, vomiting, diarrhea, hematochezia, melena, constipation, change in bowel habits GU: Denies dysuria, hematuria, polyuria, oliguria,  urethral discharge Endocrine: Denies hot or cold intolerance, polyuria, polyphagia or appetite change Derm: Denies rash, dry skin, scaling or peeling skin change Heme: Denies easy bruising, bleeding, bleeding gums Neuro: Denies headache, numbness, weakness, slurred speech, loss of memory or consciousness  Past Medical History:  He,  has a past medical history of Anxiety, Arthritis, Depression, Diabetes mellitus without complication (HCC), End stage renal disease (HCC), Hypertension, Pneumonia, and Renal disorder.   Surgical History:   Past Surgical History:  Procedure Laterality Date   A/V FISTULAGRAM Left 06/08/2023   Procedure: A/V Fistulagram;  Surgeon: Jama Cordella MATSU, MD;  Location: [facility] INVASIVE CV LAB;  Service: Cardiovascular;  Laterality: Left;   A/V SHUNT INTERVENTION Left 09/29/2023   Procedure: A/V SHUNT INTERVENTION;  Surgeon: Marea Selinda RAMAN, MD;  Location: [facility] INVASIVE CV LAB;  Service: Cardiovascular;  Laterality: Left;   APPLICATION OF WOUND VAC Right 11/14/2021   Procedure: APPLICATION OF WOUND VAC;  Surgeon: Jama Cordella MATSU, MD;  Location: [facility] ORS;  Service: Vascular;  Laterality: Right;   AV FISTULA PLACEMENT Left  11/14/2021   Procedure: ARTERIOVENOUS (AV) FISTULA CREATION ( BRACHIAL CEPHALIC);  Surgeon: Jama Cordella MATSU, MD;  Location: [facility] ORS;  Service: Vascular;  Laterality: Left;     Social History:   reports that he has never smoked. He has never used smokeless tobacco. He reports that he does not currently use drugs. He reports that he does not drink alcohol .   Family History:  His family history is not on file.   Allergies Allergies[1]   Home Medications  Prior to Admission medications  Medication Sig Start Date End Date Taking? Authorizing Provider  acetaminophen  (TYLENOL ) 325 MG tablet Take 650 mg by mouth every 6 (six) hours as needed.   Yes [provider]  ALPRAZolam  (XANAX ) 0.5 MG tablet Take 0.5 mg by mouth 3 (three) times daily as  needed for sleep. 02/05/21  Yes [provider]  amiodarone  (PACERONE ) 200 MG tablet Take 2 tablets (400 mg total) by mouth 2 (two) times daily for 7 days, THEN 1 tablet (200 mg total) daily. 07/24/23 01/22/24 Yes Sreenath, Sudheer B, MD  apixaban  (ELIQUIS ) 2.5 MG TABS tablet Take 1 tablet (2.5 mg total) by mouth 2 (two) times daily. 07/24/23 01/23/24 Yes Sreenath, Sudheer B, MD  Cholecalciferol  125 MCG (5000 UT) TABS Take 5,000 Units by mouth daily.   Yes [provider]  clopidogrel  (PLAVIX ) 75 MG tablet Take 1 tablet (75 mg total) by mouth daily. 06/09/23  Yes Schnier, Cordella MATSU, MD  diclofenac  Sodium (VOLTAREN ) 1 % GEL Apply 2 g topically 4 (four) times daily. 07/24/23  Yes Sreenath, Sudheer B, MD  Docusate Sodium  (DSS) 100 MG CAPS Take 100 mg by mouth at bedtime.   Yes [provider]  donepezil  (ARICEPT ) 10 MG tablet Take 10 mg by mouth at bedtime. 05/14/21  Yes [provider]  finasteride  (PROSCAR ) 5 MG tablet Take 1 tablet (5 mg total) by mouth daily. 08/12/19  Yes Stoioff, Glendia BROCKS, MD  lidocaine -prilocaine  (EMLA ) cream Apply 1 Application topically as needed (Dialysis). 08/18/23  Yes [provider]  metoprolol  succinate (TOPROL -XL) 25 MG 24 hr tablet Take 0.5 tablets (12.5 mg total) by mouth daily. 07/24/23 01/22/24 Yes Sreenath, Sudheer B, MD  tamsulosin  (FLOMAX ) 0.4 MG CAPS capsule Take 1 capsule (0.4 mg total) by mouth daily. 08/12/19  Yes Stoioff, Glendia BROCKS, MD  enalapril  (VASOTEC ) 20 MG tablet Take 20 mg by mouth 2 (two) times daily. Patient not taking: Reported on 01/20/2024 08/12/21   [provider]  furosemide  (LASIX ) 40 MG tablet Take 1 tablet (40 mg total) by mouth every other day. Patient not taking: Reported on 01/20/2024 07/25/23   Jhonny Calvin NOVAK, MD  glipiZIDE  (GLUCOTROL  XL) 5 MG 24 hr tablet Take by mouth. Patient not taking: Reported on 01/20/2024 12/01/11   [provider]  LOKELMA 10 g PACK packet Take 1 packet by mouth  daily. Patient not taking: Reported on 01/20/2024 07/12/23   [provider]  sodium bicarbonate  650 MG tablet Take 650 mg by mouth 2 (two) times daily. Patient not taking: Reported on 01/20/2024 06/16/23   [provider]     Critical care provider statement:   Total critical care time: 33 minutes   Performed by: Parris MD   Critical care time was exclusive of separately billable procedures and treating other patients.   Critical care was necessary to treat or prevent imminent or life-threatening deterioration.   Critical care was time spent personally by me on the following activities: development of treatment plan  with patient and/or surrogate as well as nursing, discussions with consultants, evaluation of patient's response to treatment, examination of patient, obtaining history from patient or surrogate, ordering and performing treatments and interventions, ordering and review of laboratory studies, ordering and review of radiographic studies, pulse oximetry and re-evaluation of patient's condition.    Jesselyn Rask, M.D.  Pulmonary & Critical Care Medicine                   [1] No Known Allergies  "

## 2024-01-31 NOTE — Plan of Care (Signed)

## 2024-01-31 NOTE — Progress Notes (Signed)
 [facility] CLINIC CARDIOLOGY PROGRESS NOTE   Patient ID: Jeffery Joseph MRN: [id number] DOB/AGE: 1940-03-24 84 y.o.  Admit date: 01/22/2024 Referring Physician Dr. Madison Peaches Primary Physician Epifanio Alm SQUIBB, MD  Primary Cardiologist Dr. Florencio Reason for Consultation AoCHF  HPI: Jeffery Joseph is a 84 y.o. 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 Cardiology was consulted for further evaluation.   Interval History:  - Patient seen and examined this morning, resting comfortably in hospital bed.  - Remains on levo. Reports GI upset this AM. - Denies any chest discomfort, SOB, or palpitations. HR overall stable.  Review of systems complete and found to be negative unless listed above   Vitals:   01/31/24 0730 01/31/24 0745 01/31/24 0800 01/31/24 0815  BP: (!) 108/52 (!) 110/51 (!) 111/52   Pulse: (!) 109 (!) 109 (!) 109   Resp: 20 17 (!) 21   Temp:      TempSrc:      SpO2: 100% 100% 100%   Weight:    109.2 kg  Height:         Intake/Output Summary (Last 24 hours) at 01/31/2024 0816 Last data filed at 01/31/2024 0500 Gross per 24 hour  Intake 1558.81 ml  Output 800 ml  Net 758.81 ml     PHYSICAL EXAM General: Chronically ill-appearing male, well nourished, in no acute distress. HEENT: Normocephalic and atraumatic. Neck: No JVD.  Lungs: Normal respiratory effort on room air. Clear bilaterally to auscultation. No wheezes, crackles, rhonchi.  Heart: HRR, elevated rate. Normal S1 and S2 without gallops or murmurs. Radial & DP pulses 2+ bilaterally. Abdomen: Non-distended appearing.  Msk: Normal strength and tone for age. Extremities: No clubbing, cyanosis or edema.   Neuro: Alert and oriented X 3. Psych: Mood appropriate, affect congruent.    LABS: Basic Metabolic  Panel: Recent Labs    01/29/24 0441 01/30/24 0420  NA 132* 129*  K 4.2 4.1  CL 95* 93*  CO2 25 25  GLUCOSE 164* 205*  BUN 26* 39*  CREATININE 2.98* 3.59*  CALCIUM  8.7* 9.2  MG 2.2  --    Liver Function Tests: No results for input(s): AST, ALT, ALKPHOS, BILITOT, PROT, ALBUMIN  in the last 72 hours.  No results for input(s): LIPASE, AMYLASE in the last 72 hours. CBC: Recent Labs    01/30/24 0900 01/31/24 0549  WBC 12.9* 10.7*  HGB 8.3* 8.3*  HCT 26.4* 26.6*  MCV 79.3* 79.2*  PLT 248 255   Cardiac Enzymes: No results for input(s): CKTOTAL, CKMB, CKMBINDEX, TROPONINIHS in the last 72 hours. BNP: No results for input(s): BNP in the last 72 hours. D-Dimer: No results for input(s): DDIMER in the last 72 hours. Hemoglobin A1C: No results for input(s): HGBA1C in the last 72 hours.  Fasting Lipid Panel: No results for input(s): CHOL, HDL, LDLCALC, TRIG, CHOLHDL, LDLDIRECT in the last 72 hours.  Thyroid Function Tests: No results for input(s): TSH, T4TOTAL, T3FREE, THYROIDAB in the last 72 hours.  Invalid input(s): FREET3 Anemia Panel: No results for input(s): VITAMINB12, FOLATE, FERRITIN, TIBC, IRON, RETICCTPCT in the last 72 hours.   DG Chest Port 1 View Result Date: 01/30/2024 EXAM: 1 VIEW(S) XRAY OF THE CHEST 01/30/2024 01:55:51 PM COMPARISON: 01/27/2024 CLINICAL HISTORY: Infiltrate of both lungs present on imaging study. FINDINGS: LINES, TUBES AND DEVICES: Left IJ central  venous catheter in place with tip overlying proximal SVC. Left axillary vascular stent noted. LUNGS AND PLEURA: Low lung volume. Mild-to-moderate pulmonary edema. Hazy opacity at left lung base, decreased from prior study. Probable associated atelectasis at left lung base. Small left pleural effusion. No pneumothorax. HEART AND MEDIASTINUM: Stable mild cardiomegaly. Aortic arch calcifications. BONES AND SOFT TISSUES: No acute osseous  abnormality. IMPRESSION: 1. Mild-to-moderate pulmonary edema. 2. Decreased hazy opacity at the left lung base, likely atelectasis. 3. Small left pleural effusion. Electronically signed by: Lonni Necessary MD 01/30/2024 06:41 PM EST RP Workstation: [id number]     ECHO 07/2023: 1. Left ventricular ejection fraction, by estimation, is 55 to 60%. The left ventricle has normal function. The left ventricle has no regional wall motion abnormalities. The left ventricular internal cavity size was mildly dilated. There is moderate concentric left ventricular hypertrophy. Left ventricular diastolic parameters are consistent with Grade III diastolic dysfunction  (restrictive).   2. Right ventricular systolic function is normal. The right ventricular  size is normal.   3. Left atrial size was moderately dilated.   4. Right atrial size was mild to moderately dilated.   5. The mitral valve is grossly normal. Trivial mitral valve regurgitation.   6. Tricuspid valve regurgitation is mild to moderate.   7. The aortic valve is grossly normal. Aortic valve regurgitation is  mild. Mild aortic valve stenosis.    TELEMETRY (personally reviewed): Atrial flutter rate 110s  EKG (personally reviewed): Atrial flutter rate 111 bpm, PVCs  DATA reviewed by me 01/31/24: last 24h vitals tele labs imaging I/O, hospitalist progress note, PCCM notes  Principal Problem:   Chest pain Active Problems:   Chronic atrial fibrillation (HCC)   Type 2 diabetes mellitus with chronic kidney disease, without long-term current use of insulin  (HCC)   BPH (benign prostatic hyperplasia)   End-stage renal disease on hemodialysis (HCC)   Influenza   Pressure injury of skin   Bacteremia   Splenic abscess   Bacteremia due to Enterococcus    ASSESSMENT AND PLAN: Jeffery Joseph is a 84 y.o. 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 Cardiology was consulted for further evaluation.   # Chronic atrial flutter # Acute on chronic HFpEF # ESRD on HD # Anemia Patient presented with worsening shortness of breath and chest tightness.  EKG without acute ischemic changes but did demonstrate atrial flutter which he has a known history of.  Hemoglobin was 4.5 on admission and he received transfusion, up to 9.1 this a.m.  Started on IV heparin . - Heparin  resumed, consider transition to DOAC. - MRI brain revealed acute stroke, CTA suggestive of splenic infarct. S/p IR drain 01/26/24.  Blood cultures positive for Enterococcus faecalis.  ID recommending TEE, however given his poor baseline status, continued vasopressor requirement and considering he would not be valve surgery candidate, will defer TEE. This was discussed with family over the weekend by Dr. Florencio. - Suspect troponin secondary to demand ischemia.  - Continue amiodarone  100 mg daily.  Dose was decreased at outpatient appointment due to borderline slow heart rate.  Could consider transitioning to IV infusions however rates have remained stable and he appears relatively asymptomatic. - Continue atorvastatin  80 mg daily. - HD as per nephrology, appreciate their recommendations.  This patient's case was discussed and created with Dr. Florencio and he  is in agreement.  Signed:  Danita Bloch, PA-C  01/31/2024, 8:16 AM [facility] Cardiology

## 2024-01-31 NOTE — Progress Notes (Signed)
 "  Date of Admission:  01/22/2024     ID: Jeffery Joseph is a 84 y.o. male Principal Problem:   Chest pain Active Problems:   Chronic atrial fibrillation (HCC)   Type 2 diabetes mellitus with chronic kidney disease, without long-term current use of insulin  (HCC)   BPH (benign prostatic hyperplasia)   End-stage renal disease on hemodialysis (HCC)   Influenza   Pressure injury of skin   Bacteremia   Splenic abscess   Bacteremia due to Enterococcus    Subjective: Patient has no specific complaints Sitting in chair  Medications:   amiodarone   100 mg Oral Daily   Chlorhexidine  Gluconate Cloth  6 each Topical Daily   cholecalciferol   5,000 Units Oral Daily   diclofenac  Sodium  2 g Topical QID   donepezil   10 mg Oral QHS   feeding supplement  237 mL Oral BID BM   finasteride   5 mg Oral Daily   hydrocortisone  sod succinate (SOLU-CORTEF ) inj  50 mg Intravenous Q6H   insulin  aspart  0-15 Units Subcutaneous TID WC   insulin  aspart  0-5 Units Subcutaneous QHS   lactulose   30 g Oral BID   midodrine   15 mg Oral TID WC   pantoprazole   40 mg Oral Daily   senna-docusate  2 tablet Oral q AM   tamsulosin   0.4 mg Oral Daily    Objective: Vital signs in last 24 hours: Patient Vitals for the past 24 hrs:  BP Temp Temp src Pulse Resp SpO2 Weight  01/31/24 1215 (!) 112/41 -- -- (!) 109 (!) 24 100 % --  01/31/24 1203 (!) 105/42 (!) 97.5 F (36.4 C) Oral (!) 110 (!) 21 100 % --  01/31/24 1200 (!) 95/41 -- -- (!) 111 17 100 % --  01/31/24 1145 (!) 114/46 -- -- (!) 111 (!) 21 100 % --  01/31/24 1130 (!) 121/54 -- -- (!) 111 19 100 % --  01/31/24 1115 (!) 112/50 -- -- (!) 111 18 100 % --  01/31/24 1100 (!) 112/50 -- -- (!) 111 16 100 % --  01/31/24 1045 (!) 111/46 -- -- (!) 112 19 100 % --  01/31/24 1030 (!) 116/51 -- -- (!) 110 (!) 8 100 % --  01/31/24 1015 (!) 117/50 -- -- (!) 111 (!) 23 100 % --  01/31/24 1000 (!) 115/52 -- -- (!) 111 (!) 21 100 % --  01/31/24 0945 (!) 109/47 -- -- (!)  111 19 100 % --  01/31/24 0930 (!) 112/51 -- -- (!) 109 (!) 21 100 % --  01/31/24 0915 (!) 129/54 -- -- (!) 110 (!) 22 100 % --  01/31/24 0900 (!) 121/54 -- -- (!) 110 (!) 25 100 % --  01/31/24 0845 (!) 113/55 -- -- (!) 110 (!) 21 100 % --  01/31/24 0830 (!) 113/50 -- -- 90 (!) 23 (!) 88 % --  01/31/24 0818 (!) 109/53 -- -- (!) 108 (!) 23 100 % --  01/31/24 0815 (!) 109/53 -- -- (!) 107 (!) 21 100 % 109.2 kg  01/31/24 0800 (!) 111/52 -- -- (!) 109 (!) 21 100 % --  01/31/24 0745 (!) 110/51 -- -- (!) 109 17 100 % --  01/31/24 0730 (!) 108/52 -- -- (!) 109 20 100 % --  01/31/24 0717 (!) 109/51 97.6 F (36.4 C) Oral (!) 108 20 100 % --  01/31/24 0636 -- -- -- (!) 106 12 100 % --  01/31/24 0545 (!) 109/47 -- -- Jeffery Joseph  108 14 100 % --  01/31/24 0530 (!) 109/46 -- -- (!) 108 12 100 % --  01/31/24 0515 (!) 113/50 -- -- (!) 109 15 100 % --  01/31/24 0500 (!) 108/42 -- -- (!) 106 17 100 % 109.4 kg  01/31/24 0445 (!) 106/43 -- -- (!) 107 13 100 % --  01/31/24 0437 -- -- -- (!) 108 15 100 % --  01/31/24 0430 (!) 105/41 -- -- (!) 107 12 100 % --  01/31/24 0415 (!) 101/39 -- -- (!) 107 13 100 % --  01/31/24 0400 (!) 99/42 (!) 97.5 F (36.4 C) Oral (!) 108 18 100 % --  01/31/24 0345 (!) 105/49 -- -- -- (!) 24 -- --  01/31/24 0330 (!) 104/47 -- -- -- 13 -- --  01/31/24 0315 (!) 107/44 -- -- (!) 107 13 100 % --  01/31/24 0300 (!) 108/48 -- -- -- 18 -- --  01/31/24 0245 (!) 106/48 -- -- -- 18 -- --  01/31/24 0230 (!) 105/45 -- -- -- 19 -- --  01/31/24 0215 (!) 105/42 -- -- (!) 105 (!) 24 100 % --  01/31/24 0200 (!) 108/44 -- -- (!) 105 (!) 21 100 % --  01/31/24 0145 (!) 103/42 -- -- (!) 106 19 100 % --  01/31/24 0130 (!) 100/40 -- -- (!) 105 (!) 21 100 % --  01/31/24 0115 (!) 105/45 -- -- (!) 105 17 100 % --  01/31/24 0100 (!) 99/45 (!) 97.4 F (36.3 C) Oral (!) 104 17 100 % --  01/31/24 0045 (!) 103/44 -- -- (!) 105 17 100 % --  01/31/24 0030 (!) 107/46 -- -- (!) 105 19 100 % --  01/31/24 0015 (!)  107/44 -- -- (!) 105 17 100 % --  01/31/24 0000 (!) 106/44 -- -- (!) 105 12 100 % --  01/30/24 2345 (!) 102/46 -- -- (!) 103 16 100 % --  01/30/24 2330 (!) 108/47 -- -- (!) 104 19 100 % --  01/30/24 2315 (!) 102/46 -- -- (!) 105 16 100 % --  01/30/24 2300 (!) 108/44 -- -- (!) 105 19 100 % --  01/30/24 2245 (!) 101/36 -- -- (!) 104 16 100 % --  01/30/24 2230 (!) 94/42 -- -- (!) 104 19 100 % --  01/30/24 2215 (!) 87/38 -- -- -- 20 -- --  01/30/24 2200 (!) 103/49 -- -- -- 20 -- --  01/30/24 2145 -- -- -- -- 18 -- --  01/30/24 2130 (!) 105/50 -- -- -- (!) 22 -- --  01/30/24 2100 (!) 107/47 -- -- (!) 106 19 100 % --  01/30/24 2030 (!) 105/48 -- -- -- 18 -- --  01/30/24 2000 (!) 105/43 98 F (36.7 C) Axillary (!) 105 (!) 22 100 % --  01/30/24 1930 (!) 106/46 -- -- (!) 105 (!) 21 100 % --  01/30/24 1915 -- -- -- (!) 104 13 100 % --  01/30/24 1900 (!) 97/49 -- -- (!) 101 17 100 % --  01/30/24 1800 (!) 107/44 -- -- (!) 105 18 100 % --  01/30/24 1700 (!) 96/44 -- -- -- 18 -- --  01/30/24 1600 (!) 99/44 -- -- -- (!) 21 -- --  01/30/24 1500 (!) 98/46 -- -- (!) 105 19 100 % --  01/30/24 1430 (!) 99/50 -- -- (!) 106 (!) 21 100 % --  01/30/24 1400 (!) 99/42 -- -- (!) 103 (!) 21 100 % --  01/30/24 1330 (!) 96/47 -- -- (!) 104 (!) 25 100 % --  01/30/24 1300 (!) 97/44 -- -- (!) 105 20 100 % --       PHYSICAL EXAM:  General: Alert, , no distress,.  Lungs: BiLateral air entry Heart: Tachycardic  abdomen: Soft, left upper quadrant drain- Extremities: atraumatic, no cyanosis. No edema. No clubbing Skin: No rashes or lesions. Or bruising Lymph: Cervical, supraclavicular normal. Neurologic: Grossly non-focal  Lab Results    Latest Ref Rng & Units 01/31/2024    5:49 AM 01/30/2024    9:00 AM 01/30/2024    4:20 AM  CBC  WBC 4.0 - 10.5 K/uL 10.7  12.9  12.3   Hemoglobin 13.0 - 17.0 g/dL 8.3  8.3  8.2   Hematocrit 39.0 - 52.0 % 26.6  26.4  25.9   Platelets 150 - 400 K/uL 255  248  243         Latest Ref Rng & Units 01/31/2024    8:23 AM 01/30/2024    4:20 AM 01/29/2024    4:41 AM  CMP  Glucose 70 - 99 mg/dL 810  794  835   BUN 8 - 23 mg/dL 56  39  26   Creatinine 0.61 - 1.24 mg/dL 5.91  6.40  7.01   Sodium 135 - 145 mmol/L 128  129  132   Potassium 3.5 - 5.1 mmol/L 4.4  4.1  4.2   Chloride 98 - 111 mmol/L 92  93  95   CO2 22 - 32 mmol/L 24  25  25    Calcium  8.9 - 10.3 mg/dL 9.0  9.2  8.7       Microbiology: 01/24/24 [facility] Enterococcus 4/4 Repeat blood culture  01/27/2023 Splenic abscess culture Klebsiella pneumoniae and enterococcus fecalis  Studies/Results: DG Chest Port 1 View Result Date: 01/30/2024 EXAM: 1 VIEW(S) XRAY OF THE CHEST 01/30/2024 01:55:51 PM COMPARISON: 01/27/2024 CLINICAL HISTORY: Infiltrate of both lungs present on imaging study. FINDINGS: LINES, TUBES AND DEVICES: Left IJ central venous catheter in place with tip overlying proximal SVC. Left axillary vascular stent noted. LUNGS AND PLEURA: Low lung volume. Mild-to-moderate pulmonary edema. Hazy opacity at left lung base, decreased from prior study. Probable associated atelectasis at left lung base. Small left pleural effusion. No pneumothorax. HEART AND MEDIASTINUM: Stable mild cardiomegaly. Aortic arch calcifications. BONES AND SOFT TISSUES: No acute osseous abnormality. IMPRESSION: 1. Mild-to-moderate pulmonary edema. 2. Decreased hazy opacity at the left lung base, likely atelectasis. 3. Small left pleural effusion. Electronically signed by: Lonni Necessary MD 01/30/2024 06:41 PM EST RP Workstation: [id number]     Assessment/Plan: 84 y.o. 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   Enterococcus bacteremia -with sepsis  source could be AV FISTULA  On ceftriaxone  and ampicillin .  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

## 2024-02-01 DIAGNOSIS — D733 Abscess of spleen: Secondary | ICD-10-CM | POA: Diagnosis not present

## 2024-02-01 DIAGNOSIS — R7881 Bacteremia: Secondary | ICD-10-CM | POA: Diagnosis not present

## 2024-02-01 DIAGNOSIS — B952 Enterococcus as the cause of diseases classified elsewhere: Secondary | ICD-10-CM | POA: Diagnosis not present

## 2024-02-01 DIAGNOSIS — B961 Klebsiella pneumoniae [K. pneumoniae] as the cause of diseases classified elsewhere: Secondary | ICD-10-CM | POA: Diagnosis not present

## 2024-02-01 LAB — MAGNESIUM: Magnesium: 2.3 mg/dL (ref 1.7–2.4)

## 2024-02-01 LAB — CBC
HCT: 25.9 % — ABNORMAL LOW (ref 39.0–52.0)
Hemoglobin: 8 g/dL — ABNORMAL LOW (ref 13.0–17.0)
MCH: 24.8 pg — ABNORMAL LOW (ref 26.0–34.0)
MCHC: 30.9 g/dL (ref 30.0–36.0)
MCV: 80.4 fL (ref 80.0–100.0)
Platelets: 242 K/uL (ref 150–400)
RBC: 3.22 MIL/uL — ABNORMAL LOW (ref 4.22–5.81)
RDW: 19.7 % — ABNORMAL HIGH (ref 11.5–15.5)
WBC: 9.9 K/uL (ref 4.0–10.5)
nRBC: 0 % (ref 0.0–0.2)

## 2024-02-01 LAB — CULTURE, BLOOD (ROUTINE X 2)
Culture: NO GROWTH
Culture: NO GROWTH
Special Requests: ADEQUATE
Special Requests: ADEQUATE

## 2024-02-01 LAB — BASIC METABOLIC PANEL WITH GFR
Anion gap: 12 (ref 5–15)
BUN: 46 mg/dL — ABNORMAL HIGH (ref 8–23)
CO2: 26 mmol/L (ref 22–32)
Calcium: 8.7 mg/dL — ABNORMAL LOW (ref 8.9–10.3)
Chloride: 95 mmol/L — ABNORMAL LOW (ref 98–111)
Creatinine, Ser: 3.32 mg/dL — ABNORMAL HIGH (ref 0.61–1.24)
GFR, Estimated: 18 mL/min — ABNORMAL LOW
Glucose, Bld: 174 mg/dL — ABNORMAL HIGH (ref 70–99)
Potassium: 4 mmol/L (ref 3.5–5.1)
Sodium: 132 mmol/L — ABNORMAL LOW (ref 135–145)

## 2024-02-01 LAB — HEPARIN LEVEL (UNFRACTIONATED): Heparin Unfractionated: 0.35 [IU]/mL (ref 0.30–0.70)

## 2024-02-01 LAB — GLUCOSE, CAPILLARY
Glucose-Capillary: 149 mg/dL — ABNORMAL HIGH (ref 70–99)
Glucose-Capillary: 177 mg/dL — ABNORMAL HIGH (ref 70–99)
Glucose-Capillary: 151 mg/dL — ABNORMAL HIGH (ref 70–99)
Glucose-Capillary: 132 mg/dL — ABNORMAL HIGH (ref 70–99)

## 2024-02-01 MED ORDER — APIXABAN 2.5 MG PO TABS
2.5000 mg | ORAL_TABLET | Freq: Two times a day (BID) | ORAL | Status: DC
  Administered 2024-02-01 – 2024-02-02 (×4): 2.5 mg via ORAL
  Filled 2024-02-01 (×4): qty 1

## 2024-02-01 NOTE — Progress Notes (Signed)
 [facility] CLINIC CARDIOLOGY PROGRESS NOTE   Patient ID: Jeffery Joseph MRN: [id number] DOB/AGE: [birth year] 84 y.o.  Admit date: 01/22/2024 Referring Physician Dr. Madison Peaches Primary Physician Epifanio Alm SQUIBB, MD  Primary Cardiologist Dr. Florencio Reason for Consultation AoCHF  HPI: Jeffery Joseph is a 84 y.o. 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 Cardiology was consulted for further evaluation.   Interval History:  - Patient seen and examined this morning, resting comfortably in hospital bed.  - Remains on levo but overall lower dose. Reports some mild nausea. - Denies any chest discomfort, SOB, or palpitations. HR overall stable.  Review of systems complete and found to be negative unless listed above   Vitals:   02/01/24 0800 02/01/24 0815 02/01/24 0830 02/01/24 0845  BP: (!) 115/51 (!) 117/48 (!) 110/48 (!) 110/49  Pulse: (!) 112 (!) 111 (!) 111 (!) 110  Resp: 15 14 19 19   Temp:      TempSrc:      SpO2: 99% 99% 100% 100%  Weight:      Height:         Intake/Output Summary (Last 24 hours) at 02/01/2024 0926 Last data filed at 02/01/2024 9160 Gross per 24 hour  Intake 1190.93 ml  Output 1000 ml  Net 190.93 ml     PHYSICAL EXAM General: Chronically ill-appearing male, well nourished, in no acute distress. HEENT: Normocephalic and atraumatic. Neck: No JVD.  Lungs: Normal respiratory effort on room air. Clear bilaterally to auscultation. No wheezes, crackles, rhonchi.  Heart: HRR, elevated rate. Normal S1 and S2 without gallops or murmurs. Radial & DP pulses 2+ bilaterally. Abdomen: Non-distended appearing.  Msk: Normal strength and tone for age. Extremities: No clubbing, cyanosis or edema.   Neuro: Alert and oriented X 3. Psych: Mood appropriate, affect congruent.     LABS: Basic Metabolic Panel: Recent Labs    01/31/24 0823 02/01/24 0524  NA 128* 132*  K 4.4 4.0  CL 92* 95*  CO2 24 26  GLUCOSE 189* 174*  BUN 56* 46*  CREATININE 4.08* 3.32*  CALCIUM  9.0 8.7*  MG  --  2.3  PHOS 4.7*  --    Liver Function Tests: Recent Labs    01/31/24 0823  ALBUMIN  3.1*    No results for input(s): LIPASE, AMYLASE in the last 72 hours. CBC: Recent Labs    01/31/24 0549 02/01/24 0524  WBC 10.7* 9.9  HGB 8.3* 8.0*  HCT 26.6* 25.9*  MCV 79.2* 80.4  PLT 255 242   Cardiac Enzymes: No results for input(s): CKTOTAL, CKMB, CKMBINDEX, TROPONINIHS in the last 72 hours. BNP: No results for input(s): BNP in the last 72 hours. D-Dimer: No results for input(s): DDIMER in the last 72 hours. Hemoglobin A1C: No results for input(s): HGBA1C in the last 72 hours.  Fasting Lipid Panel: No results for input(s): CHOL, HDL, LDLCALC, TRIG, CHOLHDL, LDLDIRECT in the last 72 hours.  Thyroid Function Tests: No results for input(s): TSH, T4TOTAL, T3FREE, THYROIDAB in the last 72 hours.  Invalid input(s): FREET3 Anemia Panel: No results for input(s): VITAMINB12, FOLATE, FERRITIN, TIBC, IRON, RETICCTPCT in the last 72 hours.   DG Chest Port 1 View Result Date: 01/30/2024 EXAM: 1 VIEW(S) XRAY OF THE CHEST 01/30/2024 01:55:51 PM COMPARISON: 01/27/2024 CLINICAL HISTORY: Infiltrate of both lungs present on imaging study. FINDINGS: LINES,  TUBES AND DEVICES: Left IJ central venous catheter in place with tip overlying proximal SVC. Left axillary vascular stent noted. LUNGS AND PLEURA: Low lung volume. Mild-to-moderate pulmonary edema. Hazy opacity at left lung base, decreased from prior study. Probable associated atelectasis at left lung base. Small left pleural effusion. No pneumothorax. HEART AND MEDIASTINUM: Stable mild cardiomegaly. Aortic arch calcifications. BONES AND SOFT TISSUES: No acute osseous abnormality.  IMPRESSION: 1. Mild-to-moderate pulmonary edema. 2. Decreased hazy opacity at the left lung base, likely atelectasis. 3. Small left pleural effusion. Electronically signed by: Lonni Necessary MD 01/30/2024 06:41 PM EST RP Workstation: [id number]     ECHO 07/2023: 1. Left ventricular ejection fraction, by estimation, is 55 to 60%. The left ventricle has normal function. The left ventricle has no regional wall motion abnormalities. The left ventricular internal cavity size was mildly dilated. There is moderate concentric left ventricular hypertrophy. Left ventricular diastolic parameters are consistent with Grade III diastolic dysfunction  (restrictive).   2. Right ventricular systolic function is normal. The right ventricular  size is normal.   3. Left atrial size was moderately dilated.   4. Right atrial size was mild to moderately dilated.   5. The mitral valve is grossly normal. Trivial mitral valve regurgitation.   6. Tricuspid valve regurgitation is mild to moderate.   7. The aortic valve is grossly normal. Aortic valve regurgitation is  mild. Mild aortic valve stenosis.    TELEMETRY (personally reviewed): Atrial flutter rate 110s  EKG (personally reviewed): Atrial flutter rate 111 bpm, PVCs  DATA reviewed by me 02/01/24: last 24h vitals tele labs imaging I/O, hospitalist progress note, PCCM notes  Principal Problem:   Chest pain Active Problems:   Chronic atrial fibrillation (HCC)   Type 2 diabetes mellitus with chronic kidney disease, without long-term current use of insulin  (HCC)   BPH (benign prostatic hyperplasia)   End-stage renal disease on hemodialysis (HCC)   Influenza   Pressure injury of skin   Bacteremia   Splenic abscess   Bacteremia due to Enterococcus    ASSESSMENT AND PLAN: Jeffery Joseph is a 84 y.o. 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 Cardiology was consulted for further evaluation.   # Chronic atrial flutter # Acute on chronic HFpEF # ESRD on HD # Anemia Patient presented with worsening shortness of breath and chest tightness.  EKG without acute ischemic changes but did demonstrate atrial flutter which he has a known history of.  Hemoglobin was 4.5 on admission and he received transfusion, up to 9.1 this a.m.  Started on IV heparin . - Heparin  resumed, consider transition to DOAC. - MRI brain revealed acute stroke, CTA suggestive of splenic infarct. S/p IR drain 01/26/24.  Blood cultures positive for Enterococcus faecalis.  ID recommending TEE, however given his poor baseline status, continued vasopressor requirement and considering he would not be valve surgery candidate, will defer TEE. This was discussed with family over the weekend by Dr. Florencio. - Suspect troponin secondary to demand ischemia.  - Continue amiodarone  100 mg daily.  Dose was decreased at outpatient appointment due to borderline slow heart rate.  Could consider transitioning to IV infusions however rates have remained stable and he appears relatively asymptomatic. - Continue atorvastatin  80 mg daily. - HD as per nephrology, appreciate their recommendations.  This patient's case was discussed and  created with Dr. Florencio and he is in agreement.  Signed:  Danita Bloch, PA-C  02/01/2024, 9:26 AM [facility] Cardiology

## 2024-02-01 NOTE — Progress Notes (Signed)
 "  NAME:  Jeffery Joseph, MRN:  [id number], DOB:  08-Jul-1940, LOS: 9 ADMISSION DATE:  01/22/2024, CONSULTATION DATE: 01/24/2024 REFERRING MD: Dr. 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 Symptoms began several days prior to presentation. Patient was taking oseltamivir  for possible influenza. He reported dry cough and denied nausea, vomiting, abdominal pain, diarrhea, melena, or hematochezia. Last hemodialysis session was on Friday prior to admission.  ED Course: On arrival, vital signs: BP 106/49 mmHg, HR 111 bpm, RR 20, T 34F, SpO? 100% on room air. Initial labs showed WBC 11.8 K/L, Hgb 8.0 g/dL, platelets 790 K/L, sodium 134 mmol/L, potassium 3.5 mmol/L, BUN 46 mg/dL, creatinine 4.7 mg/dL, lactate 2.2 mmol/L, AST 73 U/L, ALT 70 U/L. Chest X-ray demonstrated cardiomegaly without acute consolidation. CT chest without contrast showed cardiomegaly and no acute airspace disease. Patient received 500 mL IV normal saline bolus, famotidine , and antacids and was admitted to TRH service for observation.   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), T2DM, HTN, PAD, chronic atrial fibrillation on anticoagulation, chronic anemia, anxiety/depression, and BPH   Micro Data  COVID/Influenza A&B/RSV 01/11>>negative  RVP 01/11>>negative  Blood x2 01/11>>enterococcus Faecalis  MRSA PCR 01/12>>negative  Tracheal aspirate 01/13>>  Significant Hospital Events: Including procedures, antibiotic start and stop dates in addition to other pertinent events   1/11: Admitted to trh service 1/12: Became hypotensive  started on levophed , critical care consulted 1/13: Pt continues to require levophed  gtt complains of his stomach not feeling good CT   Abd Pelvis pending concerning for splenic fluid collection concerning for possible abscess, Surgery and IR consulted, recommends aspiration and drain placement.  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 1/14: No acute events overnight, afebrile, remains on Levophed  (weaned to 6 mcg).  More awake and alert, consult Neurology for acute CVA on MRI yesterday.  Tentative plan for IR to perform splenic aspiration/drain today.  Cardiology tentatively planning for TEE tomorrow. 01/27/24- patient with sepsis bacteremia, for TEE today.   Had splenic drain (cultures with GPC+) and dialysis yesterday. ID on case is on ampicillin .  Concern for AV graft fistula contamination.   01/28/24- less output from splenic drain this am, making some urine and gets dialysis.  He's on levophed  14mcg. Starting solucortef for suspected arenal insufficiency.   01/30/24- patient is on levophed  weaned from 12 to 9 this morning.  Mental status is with intermittent confusion.  Cbc with stable leukocytosis and chornic anemia, abscess culture with klebsiella , blood culture with enterococcus.  Treated with ampicillin /rocephin .  Family at bedside today, declined IV fluids post discussion of medical plan. Remains on midodrine  15tid and levophed . Poor prognosis DNR/DNI working with palliative.  01/31/24- patient reports constipation.  He is hypotensive on levophed  and is on day 4 abx with ampicillin  and rocephin  BID.  02/01/24- mental status is improved, levophed  is down to 4.  Continuing midodrine .  Splenic drain is removed. Cardiology, nephrology, ID is following. Amp/rocephin  day 5.  HD on MWF.     Objective    Blood pressure (!) 104/48, pulse (!) 109, temperature 97.7 F (36.5 C), temperature source Oral, resp. rate 19, height  6' 5 (1.956 m), weight 108 kg, SpO2 100%. CVP:  [6  mmHg-31 mmHg] 11 mmHg      Intake/Output Summary (Last 24 hours) at 02/01/2024 1011 Last data filed at 02/01/2024 9160 Gross per 24 hour  Intake 785.19 ml  Output 1000 ml  Net -214.81 ml   Filed Weights   01/31/24 0815 01/31/24 1200 02/01/24 0500  Weight: 109.2 kg 108.2 kg 108 kg    Examination: General: Acute on chronically-ill appearing male, NAD on RA  HENT: Supple, no JVD  Lungs: Diminished throughout, even, non labored  Cardiovascular: Sinus tachycardia, no m/r/g, 2+ radial/1+, trace generalized edema Abdomen: Hypoactive BS x4, obese, taut, non tender  Extremities: Normal bulk and tone, moves all extremities  Neuro: Alert and oriented x1, follows commands, PERRLA GU: Deferred   Specimen Description ABSCESS Performed at [facility], [address] [place], [place] [address]  Special Requests Normal WD Performed at [facility], [address] [place], [place] [address]  Gram Stain MODERATE WBC PRESENT, PREDOMINANTLY PMN MODERATE GRAM POSITIVE COCCI Performed at [facility], [address] [place], [place] [address]  Culture ABUNDANT KLEBSIELLA PNEUMONIAE ABUNDANT ENTEROCOCCUS FAECALIS NO ANAEROBES ISOLATED; CULTURE IN PROGRESS FOR 5 DAYS  Report Status PENDING  Organism ID, Bacteria KLEBSIELLA PNEUMONIAE    Blood Culture ID Panel (Reflexed) Order: [id number]  Status: Final result     Next appt: 04/20/2024 at 08:30 AM in Vascular Surgery ([facility] VASC 3)   Test Result Released: Yes (not seen)   0 Result Notes    Component Ref Range & Units (hover) 6 d ago  Enterococcus faecalis DETECTED Abnormal   Comment: CRITICAL RESULT CALLED TO, READ BACK BY AND VERIFIED WITH: MAYA CHARLENA NAPOLEON [id number] @ 2318 FH     Assessment and Plan   #Acute CVA #Dementia #Depression  MRI Brain 01/25/24: 5 mm acute infarct at the left temporooccipital junction: Mild chronic small vessel ischemic disease. -Treatment of metabolic derangements as outlined  above -Provide supportive care -Promote normal sleep/wake cycle and family presence -Avoid sedating medications as able -Neurology consulted, appreciate input  -Continue outpatient donepezil  and prn xanax    #Shock: Septic and hypovolemic -present on admission - due to bacteremia and spelnic abscess- on ampiccillin and rocephin  . ID on case appreciate input Dr Fayette           Lactate normal 01/29/24 , mentation with baseline dementia and acute critical illness acceptable with GCS12 -s/p PCD and IV abx, may need splenectomy if fails #Elevated troponin due to demand ischemia  #Chronic atrial flutter/fibrillation   #Acute on chronic HFpEF  Hx: HTN and PAD  -Echocardiogram 01/25/24: LVEF 45 to 50%, grade 2 diastolic dysfunction, RV systolic function is low normal, RV size mildly enlarged mild to moderate mitral valve regurgitation, mild to moderate tricuspid valve regurgitation, mild aortic valve regurgitation, mild aortic valve stenosis -Continuous cardiac monitoring -Maintain MAP >65 -Cautious IV fluids -Vasopressors as needed to maintain MAP goal -Continue Midodrine   -Trend lactic acid until normalized -HS Troponin peaked at 789 -Volume removal with dialysis -Cardiology following, appreciate inpnt -Continue scheduled amiodarone  and finasteride  -Holding Anticoagulation for now given pending IR aspiration of splenic abscess and new CVA  #Previous influenza infection  -repeat cxr  #Pulmonary edema  - Supplemental O2 for dyspnea and/or hypoxia - Maintain O2 sats 92% or higher  - Prn CXR's  - Fluid removal as tolerated during HD   #ESRD on HD  #Hypokalemia  #Hyponatremia  #Hypomagnesia  - Trend  BMP  - Replace electrolytes as indicated  - Strict I&O's - Nephrology consulted appreciate input: HD per recommendations   #Anemia of chronic kidney disease  - Trend CBC  - Monitor for s/sx of bleeding  - Transfuse for hgb <7  Type II diabetes mellitus  - CBG's ac/hs - SSI  -  Target CBG readings 140 to 180 - Follow hypo/hyperglycemic protocol     Labs   CBC: Recent Labs  Lab 01/25/24 1309 01/26/24 0327 01/29/24 0441 01/30/24 0420 01/30/24 0900 01/31/24 0549 02/01/24 0524  WBC 11.3*   < > 8.7 12.3* 12.9* 10.7* 9.9  NEUTROABS 8.8*  --   --   --   --   --   --   HGB 9.1*   < > 8.1* 8.2* 8.3* 8.3* 8.0*  HCT 28.6*   < > 26.3* 25.9* 26.4* 26.6* 25.9*  MCV 77.3*   < > 79.5* 78.5* 79.3* 79.2* 80.4  PLT 202   < > 214 243 248 255 242   < > = values in this interval not displayed.    Basic Metabolic Panel: Recent Labs  Lab 01/26/24 0327 01/27/24 0505 01/28/24 0425 01/29/24 0441 01/30/24 0420 01/31/24 0823 02/01/24 0524  NA 128* 132* 130* 132* 129* 128* 132*  K 3.5 3.8 4.0 4.2 4.1 4.4 4.0  CL 93* 94* 93* 95* 93* 92* 95*  CO2 26 27 26 25 25 24 26   GLUCOSE 137* 135* 146* 164* 205* 189* 174*  BUN 39* 26* 30* 26* 39* 56* 46*  CREATININE 3.77* 2.97* 3.53* 2.98* 3.59* 4.08* 3.32*  CALCIUM  8.3* 8.8* 8.7* 8.7* 9.2 9.0 8.7*  MG 1.8 1.9 2.2 2.2  --   --  2.3  PHOS 3.2 3.1 3.8  --   --  4.7*  --    GFR: Estimated Creatinine Clearance: 22.7 mL/min (A) (by C-G formula based on SCr of 3.32 mg/dL (H)). Recent Labs  Lab 01/25/24 1158 01/25/24 1309 01/29/24 1148 01/30/24 0420 01/30/24 0900 01/31/24 0549 02/01/24 0524  WBC  --    < >  --  12.3* 12.9* 10.7* 9.9  LATICACIDVEN 2.1*  --  1.0  --   --   --   --    < > = values in this interval not displayed.    Liver Function Tests: Recent Labs  Lab 01/27/24 0505 01/31/24 0823  ALBUMIN  2.6* 3.1*   No results for input(s): LIPASE, AMYLASE in the last 168 hours. No results for input(s): AMMONIA in the last 168 hours.  ABG    Component Value Date/Time   HCO3 27.2 01/23/2024 2300   TCO2 17 (L) 11/14/2021 0718   O2SAT 98.3 01/29/2024 1255     Coagulation Profile: Recent Labs  Lab 01/26/24 0949 01/28/24 1400  INR 1.6* 1.5*    Cardiac Enzymes: No results for input(s): CKTOTAL,  CKMB, CKMBINDEX, TROPONINI in the last 168 hours.  HbA1C: Hgb A1c MFr Bld  Date/Time Value Ref Range Status  01/23/2024 04:30 AM 5.9 (H) 4.8 - 5.6 % Final    Comment:    (NOTE) Diagnosis of Diabetes The following HbA1c ranges recommended by the American Diabetes Association (ADA) may be used as an aid in the diagnosis of diabetes mellitus.  Hemoglobin             Suggested A1C NGSP%              Diagnosis  <5.7  Non Diabetic  5.7-6.4                Pre-Diabetic  >6.4                   Diabetic  <7.0                   Glycemic control for                       adults with diabetes.    01/23/2024 03:23 AM 5.7 (H) 4.8 - 5.6 % Final    Comment:    (NOTE) Diagnosis of Diabetes The following HbA1c ranges recommended by the American Diabetes Association (ADA) may be used as an aid in the diagnosis of diabetes mellitus.  Hemoglobin             Suggested A1C NGSP%              Diagnosis  <5.7                   Non Diabetic  5.7-6.4                Pre-Diabetic  >6.4                   Diabetic  <7.0                   Glycemic control for                       adults with diabetes.      CBG: Recent Labs  Lab 01/31/24 0722 01/31/24 1117 01/31/24 1620 01/31/24 2119 02/01/24 0754  GLUCAP 165* 123* 176* 143* 149*    Review of Systems: Positives in BOLD   Gen: Denies fever, chills, weight change, fatigue, night sweats HEENT: Denies blurred vision, double vision, hearing loss, tinnitus, sinus congestion, rhinorrhea, sore throat, neck stiffness, dysphagia PULM: Denies shortness of breath, cough, sputum production, hemoptysis, wheezing CV: Denies chest pain, edema, orthopnea, paroxysmal nocturnal dyspnea, palpitations GI: abdominal discomfort, nausea, vomiting, diarrhea, hematochezia, melena, constipation, change in bowel habits GU: Denies dysuria, hematuria, polyuria, oliguria, urethral discharge Endocrine: Denies hot or cold intolerance,  polyuria, polyphagia or appetite change Derm: Denies rash, dry skin, scaling or peeling skin change Heme: Denies easy bruising, bleeding, bleeding gums Neuro: Denies headache, numbness, weakness, slurred speech, loss of memory or consciousness  Past Medical History:  He,  has a past medical history of Anxiety, Arthritis, Depression, Diabetes mellitus without complication (HCC), End stage renal disease (HCC), Hypertension, Pneumonia, and Renal disorder.   Surgical History:   Past Surgical History:  Procedure Laterality Date   A/V FISTULAGRAM Left 06/08/2023   Procedure: A/V Fistulagram;  Surgeon: Jama Cordella MATSU, MD;  Location: [facility] INVASIVE CV LAB;  Service: Cardiovascular;  Laterality: Left;   A/V SHUNT INTERVENTION Left 09/29/2023   Procedure: A/V SHUNT INTERVENTION;  Surgeon: Marea Selinda RAMAN, MD;  Location: [facility] INVAS[facility] CV [facility];  Service: Cardiovascular;  Laterality: Left;   APPLICATION OF WOUND VAC Right 11/14/2021   Procedure: APPLICATION OF WOUND VAC;  Surgeon: Jama Cordella MATSU, MD;  Location: [facility] ORS;  Service: Vascular;  Laterality: Right;   AV FISTULA PLACEMENT Left 11/14/2021   Procedure: ARTERIOVENOUS (AV) FISTULA CREATION ( BRACHIAL CEPHALIC);  Surgeon: Jama Cordella MATSU, MD;  Location: [facility] ORS;  Service: Vascular;  Laterality: Left;     Social History:   reports that  he has never smoked. He has never used smokeless tobacco. He reports that he does not currently use drugs. He reports that he does not drink alcohol .   Family History:  His family history is not on file.   Allergies Allergies[1]   Home Medications  Prior to Admission medications  Medication Sig Start Date End Date Taking? Authorizing Provider  acetaminophen  (TYLENOL ) 325 MG tablet Take 650 mg by mouth every 6 (six) hours as needed.   Yes [provider]  ALPRAZolam  (XANAX ) 0.5 MG tablet Take 0.5 mg by mouth 3 (three) times daily as needed for sleep. 02/05/21  Yes [provider]   amiodarone  (PACERONE ) 200 MG tablet Take 2 tablets (400 mg total) by mouth 2 (two) times daily for 7 days, THEN 1 tablet (200 mg total) daily. 07/24/23 01/22/24 Yes Sreenath, Sudheer B, MD  apixaban  (ELIQUIS ) 2.5 MG TABS tablet Take 1 tablet (2.5 mg total) by mouth 2 (two) times daily. 07/24/23 01/23/24 Yes Sreenath, Sudheer B, MD  Cholecalciferol  125 MCG (5000 UT) TABS Take 5,000 Units by mouth daily.   Yes [provider]  clopidogrel  (PLAVIX ) 75 MG tablet Take 1 tablet (75 mg total) by mouth daily. 06/09/23  Yes Schnier, Cordella MATSU, MD  diclofenac  Sodium (VOLTAREN ) 1 % GEL Apply 2 g topically 4 (four) times daily. 07/24/23  Yes Sreenath, Sudheer B, MD  Docusate Sodium  (DSS) 100 MG CAPS Take 100 mg by mouth at bedtime.   Yes [provider]  donepezil  (ARICEPT ) 10 MG tablet Take 10 mg by mouth at bedtime. 05/14/21  Yes [provider]  finasteride  (PROSCAR ) 5 MG tablet Take 1 tablet (5 mg total) by mouth daily. 08/12/19  Yes Stoioff, Glendia BROCKS, MD  lidocaine -prilocaine  (EMLA ) cream Apply 1 Application topically as needed (Dialysis). 08/18/23  Yes [provider]  metoprolol  succinate (TOPROL -XL) 25 MG 24 hr tablet Take 0.5 tablets (12.5 mg total) by mouth daily. 07/24/23 01/22/24 Yes Sreenath, Sudheer B, MD  tamsulosin  (FLOMAX ) 0.4 MG CAPS capsule Take 1 capsule (0.4 mg total) by mouth daily. 08/12/19  Yes Stoioff, Glendia BROCKS, MD  enalapril  (VASOTEC ) 20 MG tablet Take 20 mg by mouth 2 (two) times daily. Patient not taking: Reported on 01/20/2024 08/12/21   [provider]  furosemide  (LASIX ) 40 MG tablet Take 1 tablet (40 mg total) by mouth every other day. Patient not taking: Reported on 01/20/2024 07/25/23   Jhonny Calvin NOVAK, MD  glipiZIDE  (GLUCOTROL  XL) 5 MG 24 hr tablet Take by mouth. Patient not taking: Reported on 01/20/2024 12/01/11   [provider]  LOKELMA 10 g PACK packet Take 1 packet by mouth daily. Patient not taking: Reported on 01/20/2024 07/12/23    [provider]  sodium bicarbonate  650 MG tablet Take 650 mg by mouth 2 (two) times daily. Patient not taking: Reported on 01/20/2024 06/16/23   [provider]     Critical care provider statement:   Total critical care time: 33 minutes   Performed by: Parris MD   Critical care time was exclusive of separately billable procedures and treating other patients.   Critical care was necessary to treat or prevent imminent or life-threatening deterioration.   Critical care was time spent personally by me on the following activities: development of treatment plan with patient and/or surrogate as well as nursing, discussions with consultants, evaluation of patient's response to treatment, examination of patient, obtaining history from patient or surrogate, ordering and performing treatments and interventions, ordering and review of laboratory  studies, ordering and review of radiographic studies, pulse oximetry and re-evaluation of patient's condition.    Jamariah Tony, M.D.  Pulmonary & Critical Care Medicine                    [1] No Known Allergies  "

## 2024-02-01 NOTE — Progress Notes (Signed)
 " [facility]  PROGRESS NOTE   Subjective:   84 year old male with end-stage renal disease, anxiety, depression, type 2 diabetes originally admitted on 01/22/2023 for complaints of dyspnea, chest tightness. No complaints today.  Appears to be more alert today and clinically improved.  States he is able to eat a little bit.  1 L of fluid removed with dialysis yesterday.  Currently on room air.    Objective:  Vital signs: Blood pressure (!) 91/44, pulse (!) 117, temperature 97.7 F (36.5 C), temperature source Oral, resp. rate (!) 24, height 6' 5 (1.956 m), weight 108 kg, SpO2 98%.  Intake/Output Summary (Last 24 hours) at 02/01/2024 0828 Last data filed at 02/01/2024 0400 Gross per 24 hour  Intake 1097.67 ml  Output 1000 ml  Net 97.67 ml   Filed Weights   01/31/24 0815 01/31/24 1200 02/01/24 0500  Weight: 109.2 kg 108.2 kg 108 kg     Physical Exam: General:  No acute distress  Head:  Normocephalic, atraumatic. Moist oral mucosal membranes  Eyes:  Anicteric  Lungs:   Clear to auscultation, normal effort  Heart: Tachycardic, regular  Abdomen:   Soft, nontender,   Extremities: Trace peripheral edema.  Neurologic:  Awake, alert, following commands  Skin:  No lesions  Access: Left upper arm AV fistula.    Basic Metabolic Panel: Recent Labs  Lab 01/26/24 0327 01/27/24 0505 01/28/24 0425 01/29/24 0441 01/30/24 0420 01/31/24 0823 02/01/24 0524  NA 128* 132* 130* 132* 129* 128* 132*  K 3.5 3.8 4.0 4.2 4.1 4.4 4.0  CL 93* 94* 93* 95* 93* 92* 95*  CO2 26 27 26 25 25 24 26   GLUCOSE 137* 135* 146* 164* 205* 189* 174*  BUN 39* 26* 30* 26* 39* 56* 46*  CREATININE 3.77* 2.97* 3.53* 2.98* 3.59* 4.08* 3.32*  CALCIUM  8.3* 8.8* 8.7* 8.7* 9.2 9.0 8.7*  MG 1.8 1.9 2.2 2.2  --   --  2.3  PHOS 3.2 3.1 3.8  --   --  4.7*  --    GFR: Estimated Creatinine Clearance: 22.7 mL/min (A) (by C-G formula based on SCr of 3.32 mg/dL (H)).  Liver Function Tests: Recent Labs  Lab  01/27/24 0505 01/31/24 0823  ALBUMIN  2.6* 3.1*   No results for input(s): LIPASE, AMYLASE in the last 168 hours. No results for input(s): AMMONIA in the last 168 hours.  CBC: Recent Labs  Lab 01/25/24 1309 01/26/24 0327 01/29/24 0441 01/30/24 0420 01/30/24 0900 01/31/24 0549 02/01/24 0524  WBC 11.3*   < > 8.7 12.3* 12.9* 10.7* 9.9  NEUTROABS 8.8*  --   --   --   --   --   --   HGB 9.1*   < > 8.1* 8.2* 8.3* 8.3* 8.0*  HCT 28.6*   < > 26.3* 25.9* 26.4* 26.6* 25.9*  MCV 77.3*   < > 79.5* 78.5* 79.3* 79.2* 80.4  PLT 202   < > 214 243 248 255 242   < > = values in this interval not displayed.     HbA1C: Hgb A1c MFr Bld  Date/Time Value Ref Range Status  01/23/2024 04:30 AM 5.9 (H) 4.8 - 5.6 % Final    Comment:    (NOTE) Diagnosis of Diabetes The following HbA1c ranges recommended by the American Diabetes Association (ADA) may be used as an aid in the diagnosis of diabetes mellitus.  Hemoglobin             Suggested A1C NGSP%  Diagnosis  <5.7                   Non Diabetic  5.7-6.4                Pre-Diabetic  >6.4                   Diabetic  <7.0                   Glycemic control for                       adults with diabetes.    01/23/2024 03:23 AM 5.7 (H) 4.8 - 5.6 % Final    Comment:    (NOTE) Diagnosis of Diabetes The following HbA1c ranges recommended by the American Diabetes Association (ADA) may be used as an aid in the diagnosis of diabetes mellitus.  Hemoglobin             Suggested A1C NGSP%              Diagnosis  <5.7                   Non Diabetic  5.7-6.4                Pre-Diabetic  >6.4                   Diabetic  <7.0                   Glycemic control for                       adults with diabetes.      Urinalysis: No results for input(s): COLORURINE, LABSPEC, PHURINE, GLUCOSEU, HGBUR, BILIRUBINUR, KETONESUR, PROTEINUR, UROBILINOGEN, NITRITE, LEUKOCYTESUR in the last 72 hours.  Invalid  input(s): APPERANCEUR    Imaging: DG Chest Port 1 View Result Date: 01/30/2024 EXAM: 1 VIEW(S) XRAY OF THE CHEST 01/30/2024 01:55:51 PM COMPARISON: 01/27/2024 CLINICAL HISTORY: Infiltrate of both lungs present on imaging study. FINDINGS: LINES, TUBES AND DEVICES: Left IJ central venous catheter in place with tip overlying proximal SVC. Left axillary vascular stent noted. LUNGS AND PLEURA: Low lung volume. Mild-to-moderate pulmonary edema. Hazy opacity at left lung base, decreased from prior study. Probable associated atelectasis at left lung base. Small left pleural effusion. No pneumothorax. HEART AND MEDIASTINUM: Stable mild cardiomegaly. Aortic arch calcifications. BONES AND SOFT TISSUES: No acute osseous abnormality. IMPRESSION: 1. Mild-to-moderate pulmonary edema. 2. Decreased hazy opacity at the left lung base, likely atelectasis. 3. Small left pleural effusion. Electronically signed by: Lonni Necessary MD 01/30/2024 06:41 PM EST RP Workstation: [id number]     Medications:    ampicillin  (OMNIPEN) IV Stopped (01/31/24 2340)   cefTRIAXone  (ROCEPHIN )  IV Stopped (01/31/24 2216)   heparin  1,650 Units/hr (02/01/24 0400)   norepinephrine  (LEVOPHED ) Adult infusion 4 mcg/min (02/01/24 0400)    amiodarone   100 mg Oral Daily   Chlorhexidine  Gluconate Cloth  6 each Topical Daily   cholecalciferol   5,000 Units Oral Daily   diclofenac  Sodium  2 g Topical QID   donepezil   10 mg Oral QHS   feeding supplement  237 mL Oral BID BM   finasteride   5 mg Oral Daily   hydrocortisone  sod succinate (SOLU-CORTEF ) inj  50 mg Intravenous Q6H   insulin  aspart  0-15 Units Subcutaneous TID WC   insulin  aspart  0-5 Units Subcutaneous QHS  midodrine   15 mg Oral TID WC   multivitamin  1 tablet Oral QHS   pantoprazole   40 mg Oral Daily   senna-docusate  2 tablet Oral q AM   tamsulosin   0.4 mg Oral Daily    Assessment/ Plan:     84 year old male with history of hypertension, coronary artery disease,  atrial fibrillation, diabetes, anemia, anxiety/depression, BPH and end-stage renal disease on hemodialysis Monday Wednesday Friday schedule now admitted with history of chest tightness and shortness of breath.   #1: ESRD: Continue dialysis schedule Monday Wednesday and Friday.  Next dialysis will be scheduled for Wednesday.  UF goal 0.5 to 1 L.   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   #3: Secondary parathyroidism: Will monitor calcium , phosphorus and PTH.   #4: Sepsis: Patient has Enterococcus bacteremia.  Patient also has splenic abscess.  Minimal drainage therefore drain was removed by interventional radiology.  Presently on ampicillin  and Rocephin .   #5: Hypotension: Managed with midodrine .  Labs and medications reviewed. Will continue to follow along with you.   LOS: 9 Saralee Stank, MD [facility] 1/20/20268:28 AM  "

## 2024-02-01 NOTE — Progress Notes (Signed)
 "  Date of Admission:  01/22/2024     ID: Jeffery Joseph is a 84 y.o. male Principal Problem:   Chest pain Active Problems:   Chronic atrial fibrillation (HCC)   Type 2 diabetes mellitus with chronic kidney disease, without long-term current use of insulin  (HCC)   BPH (benign prostatic hyperplasia)   End-stage renal disease on hemodialysis (HCC)   Influenza   Pressure injury of skin   Bacteremia   Splenic abscess   Bacteremia due to Enterococcus    Subjective: Daughter states that after splenic  drain was removed yesterday he had lot of discharge on his sheet HE is feeling hot today he says  Medications:   amiodarone   100 mg Oral Daily   apixaban   2.5 mg Oral BID   Chlorhexidine  Gluconate Cloth  6 each Topical Daily   cholecalciferol   5,000 Units Oral Daily   diclofenac  Sodium  2 g Topical QID   donepezil   10 mg Oral QHS   feeding supplement  237 mL Oral BID BM   finasteride   5 mg Oral Daily   insulin  aspart  0-15 Units Subcutaneous TID WC   insulin  aspart  0-5 Units Subcutaneous QHS   midodrine   15 mg Oral TID WC   multivitamin  1 tablet Oral QHS   pantoprazole   40 mg Oral Daily   senna-docusate  2 tablet Oral q AM    Objective: Vital signs in last 24 hours: Patient Vitals for the past 24 hrs:  BP Temp Temp src Pulse Resp SpO2 Weight  02/01/24 1830 (!) 101/49 -- -- (!) 107 12 100 % --  02/01/24 1815 (!) 111/52 -- -- (!) 106 19 (!) 79 % --  02/01/24 1800 (!) 103/49 -- -- 89 (!) 22 95 % --  02/01/24 1730 (!) 116/47 -- -- -- (!) 21 -- --  02/01/24 1715 (!) 95/49 -- -- -- 17 -- --  02/01/24 1700 (!) 110/43 -- -- (!) 110 18 100 % --  02/01/24 1645 (!) 110/51 -- -- (!) 111 14 100 % --  02/01/24 1630 (!) 102/48 -- -- (!) 109 10 100 % --  02/01/24 1615 (!) 99/44 -- -- (!) 109 16 99 % --  02/01/24 1600 (!) 99/44 97.7 F (36.5 C) Oral (!) 109 14 99 % --  02/01/24 1530 (!) 99/46 -- -- (!) 109 18 97 % --  02/01/24 1515 (!) 105/48 -- -- (!) 109 (!) 24 100 % --  02/01/24 1500  (!) 90/45 -- -- (!) 108 19 98 % --  02/01/24 1445 (!) 91/45 -- -- (!) 107 19 98 % --  02/01/24 1430 (!) 89/37 -- -- (!) 104 14 97 % --  02/01/24 1415 (!) 97/48 -- -- (!) 104 13 99 % --  02/01/24 1400 (!) 93/42 -- -- (!) 106 14 99 % --  02/01/24 1345 (!) 92/39 -- -- (!) 107 18 99 % --  02/01/24 1330 104/89 -- -- (!) 109 17 99 % --  02/01/24 1315 (!) 101/45 -- -- (!) 109 18 96 % --  02/01/24 1300 (!) 119/49 -- -- 94 13 (!) 85 % --  02/01/24 1245 (!) 98/49 -- -- -- 20 -- --  02/01/24 1240 -- -- -- -- 17 -- --  02/01/24 1215 (!) 98/47 -- -- (!) 109 16 93 % --  02/01/24 1200 (!) 102/55 97.8 F (36.6 C) Oral -- (!) 24 99 % --  02/01/24 1128 (!) 113/50 -- -- -- 15 -- --  02/01/24 1104 (!) 118/50 -- -- -- 14 -- --  02/01/24 1045 (!) 124/48 -- -- -- 19 -- --  02/01/24 1000 (!) 104/48 -- -- (!) 109 19 100 % --  02/01/24 0945 (!) 102/50 -- -- (!) 109 16 99 % --  02/01/24 0930 (!) 105/42 -- -- (!) 109 11 99 % --  02/01/24 0915 (!) 106/43 -- -- (!) 110 13 98 % --  02/01/24 0900 (!) 106/44 -- -- (!) 110 15 100 % --  02/01/24 0845 (!) 110/49 -- -- (!) 110 19 100 % --  02/01/24 0830 (!) 110/48 -- -- (!) 111 19 100 % --  02/01/24 0815 (!) 117/48 -- -- (!) 111 14 99 % --  02/01/24 0800 (!) 115/51 -- -- (!) 112 15 99 % --  02/01/24 0745 (!) 91/44 97.7 F (36.5 C) Oral (!) 117 (!) 24 98 % --  02/01/24 0730 (!) 111/49 -- -- (!) 110 18 97 % --  02/01/24 0715 (!) 104/49 -- -- (!) 110 14 99 % --  02/01/24 0545 (!) 115/54 -- -- (!) 111 (!) 21 99 % --  02/01/24 0530 (!) 108/55 -- -- (!) 105 (!) 25 98 % --  02/01/24 0515 (!) 114/53 -- -- (!) 111 14 98 % --  02/01/24 0500 (!) 109/52 -- -- (!) 110 13 98 % 108 kg  02/01/24 0445 (!) 109/54 -- -- (!) 110 19 96 % --  02/01/24 0430 (!) 106/50 -- -- (!) 111 19 95 % --  02/01/24 0415 (!) 114/52 -- -- (!) 110 18 96 % --  02/01/24 0400 (!) 111/54 97.7 F (36.5 C) Oral (!) 110 17 96 % --  02/01/24 0345 (!) 113/53 -- -- (!) 110 16 97 % --  02/01/24 0330 (!) 109/51  -- -- (!) 110 19 96 % --  02/01/24 0315 (!) 111/48 -- -- (!) 110 (!) 23 99 % --  02/01/24 0300 (!) 109/50 -- -- (!) 110 13 97 % --  02/01/24 0245 (!) 108/51 -- -- (!) 109 14 97 % --  02/01/24 0230 (!) 111/52 -- -- (!) 109 12 99 % --  02/01/24 0215 (!) 107/50 -- -- (!) 109 13 98 % --  02/01/24 0200 (!) 108/48 -- -- (!) 109 16 98 % --  02/01/24 0030 (!) 104/49 -- -- (!) 109 20 98 % --  02/01/24 0015 -- -- -- (!) 109 19 97 % --  02/01/24 0000 -- 97.8 F (36.6 C) Oral (!) 109 11 98 % --  01/31/24 2345 -- -- -- (!) 109 17 96 % --  01/31/24 2330 (!) 115/49 -- -- (!) 110 18 98 % --  01/31/24 2315 (!) 118/49 -- -- (!) 110 20 98 % --  01/31/24 2300 (!) 114/54 -- -- (!) 110 20 99 % --  01/31/24 2245 (!) 110/45 -- -- (!) 110 13 98 % --  01/31/24 2230 (!) 112/52 -- -- (!) 109 14 98 % --  01/31/24 2215 (!) 106/48 -- -- (!) 110 16 99 % --  01/31/24 2200 (!) 108/47 -- -- (!) 110 14 99 % --  01/31/24 2145 (!) 114/44 -- -- (!) 110 20 98 % --  01/31/24 2130 -- -- -- (!) 110 (!) 22 98 % --  01/31/24 2115 -- -- -- (!) 110 17 99 % --       PHYSICAL EXAM:  General: Alert, , no distress,.  Lungs: BiLateral air entry Heart: Tachycardic  abdomen: Soft,  drain removed  edema. No clubbing Skin: No rashes or lesions. Or bruising Lymph: Cervical, supraclavicular normal. Neurologic: Grossly non-focal  Lab Results    Latest Ref Rng & Units 02/01/2024    5:24 AM 01/31/2024    5:49 AM 01/30/2024    9:00 AM  CBC  WBC 4.0 - 10.5 K/uL 9.9  10.7  12.9   Hemoglobin 13.0 - 17.0 g/dL 8.0  8.3  8.3   Hematocrit 39.0 - 52.0 % 25.9  26.6  26.4   Platelets 150 - 400 K/uL 242  255  248        Latest Ref Rng & Units 02/01/2024    5:24 AM 01/31/2024    8:23 AM 01/30/2024    4:20 AM  CMP  Glucose 70 - 99 mg/dL 825  810  794   BUN 8 - 23 mg/dL 46  56  39   Creatinine 0.61 - 1.24 mg/dL 6.67  5.91  6.40   Sodium 135 - 145 mmol/L 132  128  129   Potassium 3.5 - 5.1 mmol/L 4.0  4.4  4.1   Chloride 98 - 111 mmol/L 95   92  93   CO2 22 - 32 mmol/L 26  24  25    Calcium  8.9 - 10.3 mg/dL 8.7  9.0  9.2       Microbiology: 01/24/24 BC Enterococcus 4/4 Repeat blood culture  01/27/2023- NG Splenic abscess culture Klebsiella pneumoniae and enterococcus fecalis    Assessment/Plan: 84 y.o. 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   Enterococcus bacteremia -with sepsis  source could be AV FISTULA  On ceftriaxone  and ampicillin .  2 d echo no vegetation - cardiology wants him to  be treated as endocarditis with no TEE as he is not stable to have the procedure  Splenic abscess raises  concern for endocarditis  Splenic abscess-  IR placed drain and they removed it yesterday following their protocol-no imaging was repeated Pt now feels hot Will observe closely and image if needed  Enterococcus and klebsiella pneumoniae in splenic abscess culture Klesiella susceptible to ceftriaxone    persistent hypotension On levo Midodrine  Stress dose steroids ( no cortisol level) Pt on tamsulosin - consider DC  ESRD on HD thru AVF   DM - management as per primary team   Afib on amiodarone    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

## 2024-02-01 NOTE — TOC Progression Note (Signed)
 Transition of Care [facility]) - Progression Note    Patient Details  Name: Jeffery Joseph MRN: [id number] Date of Birth: [birth year]  [facility] CM/SW Contact  Corrie JINNY Ruts, LCSW Phone Number: 02/01/2024, 1:08 PM  Clinical Narrative:    Chart reviewed. Spoke with the patient daughter about SNF recommendation. The patient daughter reports that she would like to wait for the patient to medically progress before making a decision. Patient daughter reports that SNF placement is the last resort.  SW will follow up when patient is stonger to determine placement.                      Expected Discharge Plan and Services                                               Social Drivers of Health (SDOH) Interventions SDOH Screenings   Food Insecurity: Patient Unable To Answer (01/24/2024)  Housing: Unknown (01/24/2024)  Transportation Needs: Patient Unable To Answer (01/24/2024)  Utilities: Patient Unable To Answer (01/24/2024)  Financial Resource Strain: Low Risk  (12/01/2023)   Received from [facility]  Social Connections: Unknown (01/24/2024)  Tobacco Use: Low Risk (01/30/2024)  Recent Concern: Tobacco Use - Medium Risk (12/01/2023)   [facility]    Readmission Risk Interventions    01/24/2024   12:12 PM 07/19/2023   11:05 AM  Readmission Risk Prevention Plan  Transportation Screening Complete Complete  PCP or Specialist Appt within 3-5 Days Complete Complete  HRI or Home Care Consult Complete   Social Work Consult for Recovery Care Planning/Counseling Complete Complete  Palliative Care Screening Not Applicable Not Applicable  Medication Review Oceanographer) Complete Complete

## 2024-02-01 NOTE — Consult Note (Signed)
 PHARMACY - ANTICOAGULATION CONSULT NOTE  Pharmacy Consult for IV Heparin  Indication: atrial fibrillation  Allergies[1]  Patient Measurements: Height: 6' 5 (195.6 cm) Weight: 108 kg (238 lb 1.6 oz) IBW/kg (Calculated) : 89.1 HEPARIN  DW (KG): 96.9  Labs: Recent Labs    01/30/24 0420 01/30/24 0900 01/30/24 1230 01/31/24 0549 01/31/24 0823 01/31/24 0831 01/31/24 1618 02/01/24 0524  HGB 8.2* 8.3*  --  8.3*  --   --   --  8.0*  HCT 25.9* 26.4*  --  26.6*  --   --   --  25.9*  PLT 243 248  --  255  --   --   --  242  HEPARINUNFRC 0.35  --    < >  --   --  0.35 0.30 0.35  CREATININE 3.59*  --   --   --  4.08*  --   --  3.32*   < > = values in this interval not displayed.    Estimated Creatinine Clearance: 22.7 mL/min (A) (by C-G formula based on SCr of 3.32 mg/dL (H)).   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 Source thought to be from splenic abscess now s/p drain placement. Patient suffered acute stroke as seen on MRI this admission. Pharmacy consulted to dose heparin  for Afib.  Patient's last dose of apixaban  was on 01/23/24  Goal of Therapy:  Heparin  level 0.3-0.7 units/ml Monitor platelets by anticoagulation protocol: Yes   Date Time  Results Comments 1/17 0022 aPTT 7 Therapeutic x 1 1/17 0746  HL 0.33  Therapeutic x2 but rate changed to 1150u/h,Hgb/plt stable. 1/17 1607 HL 0.29 Subtherapeutic, rate 1200u/hr 1/18 0420 HL 0.35 Therapeutic x 1 1/18 1230 HL 0.23 Subtherapeutic, rate 1300u/h 1/18 2202 HL  0.26 Subtherapeutic, rate 1450u/h 1/19 0831 HL 0.35 Therapeutic x 1 1/19 1618 HL 0.30 Therapeutic x 2 1/20 0527 HL 0.35 Therapeutic x 3  Plan:  Since on lower end of goal, will slightly increase heparin  infusion to 1650 units/hr Check heparin  level tomorrow morning with AM labs CBC once daily while on IV heparin   Rankin CANDIE Dills, PharmD, [facility] 02/01/2024 6:32 AM               [1] No Known Allergies

## 2024-02-01 NOTE — Progress Notes (Signed)
 Physical Therapy Treatment Patient Details Name: Jeffery Joseph MRN: [id number] DOB: 1940/09/11 Today's Date: 02/01/2024   History of Present Illness Pt is an 84 y/o M admitted on 01/22/24 after presenting with c/o acute onset dyspnea & chest tightness. Pt admitted for observation, became hypotensive. CT concerning for splenic fluid collection/possible abscess, s/p aspiration & drain placement on 01/26/24. 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 Splenic abscess culture with klebsiella, blood cultur with enterococcus. PMH: ESRD on HD MWF, DM2, HTN, PAD, chronic a-fib, chronic anemia, anxiety, depression, BPH, dementia    PT Comments  Pt was long sitting in bed with daughter at bedside. He is alert and cooperative but does have some baseline cognitive deficits. He remains motivated and pleasant. Fatigued quickly with minimal activity. Vitals remains stable on room air. Pt returned to bed at conclusion of session. Acute PT will continue to follow and progress per current POC. Will benefit from +2 assistance for any/all ambulation attempts due to need for chair follow for balance and fatigue.   If plan is discharge home, recommend the following: Two people to help with walking and/or transfers;Assistance with feeding;Assistance with cooking/housework;Two people to help with bathing/dressing/bathroom;Direct supervision/assist for medications management;Direct supervision/assist for financial management;Assist for transportation;Help with stairs or ramp for entrance;Supervision due to cognitive status     Equipment Recommendations  None recommended by PT       Precautions / Restrictions Precautions Precautions: Fall Precaution/Restrictions Comments: LUE fistula/ CVP, cardiac tele Restrictions Weight Bearing Restrictions Per Provider Order: No     Mobility  Bed Mobility Overal bed mobility: Needs Assistance Bed Mobility: Supine to Sit, Sit to Supine  Supine to sit: Min assist,  Contact guard Sit to supine: Contact guard assist, Min assist   Transfers Overall transfer level: Needs assistance Equipment used: Rolling walker (2 wheels) Transfers: Sit to/from Stand Sit to Stand: Min assist, From elevated surface  General transfer comment: pt stood EOB 2 x with slight impulsivity noted. pty eager to move away form EOB however lines currently limiting his abiltiies to ambulate. Recommend +2 for chair followin future sessions if plannign to ambulate . Pt fatigued quickly in standing. only tolerated standing ~ 30 sec before needing seated rest    Ambulation/Gait  General Gait Details: Elected not to ambulate away from EOB 2/2 to CVP in place and only +1 in at this time. Recommend +2 assistance fo future ambulation.   Balance Overall balance assessment: Needs assistance Sitting-balance support: Feet supported Sitting balance-Leahy Scale: Fair     Standing balance support: Bilateral upper extremity supported, During functional activity Standing balance-Leahy Scale: Poor Standing balance comment: Pt did have one posterior LOB in which he used back of legs againsrt bed to prevent fall.       Communication Communication Communication: No apparent difficulties  Cognition Arousal: Alert Behavior During Therapy: WFL for tasks assessed/performed   PT - Cognitive impairments: History of cognitive impairments, Problem solving, Awareness, Safety/Judgement, Memory      PT - Cognition Comments: Pt is alert but does have some baseline cognitive deficits Following commands: Impaired Following commands impaired: Follows one step commands with increased time, Only follows one step commands consistently    Cueing Cueing Techniques: Verbal cues         Pertinent Vitals/Pain Pain Assessment Pain Assessment: No/denies pain     PT Goals (current goals can now be found in the care plan section) Acute Rehab PT Goals Patient Stated Goal: Just ready to get out of here Progress  towards PT goals: Progressing toward goals    Frequency    Min 2X/week       Co-evaluation     PT goals addressed during session: Mobility/safety with mobility;Balance;Proper use of DME;Strengthening/ROM        AM-PAC PT 6 Clicks Mobility   Outcome Measure  Help needed turning from your back to your side while in a flat bed without using bedrails?: None Help needed moving from lying on your back to sitting on the side of a flat bed without using bedrails?: A Little Help needed moving to and from a bed to a chair (including a wheelchair)?: A Little Help needed standing up from a chair using your arms (e.g., wheelchair or bedside chair)?: A Little Help needed to walk in hospital room?: Total Help needed climbing 3-5 steps with a railing? : Total 6 Click Score: 15    End of Session Equipment Utilized During Treatment:  (pt was on rm air throughout session. sao2 > 97%) Activity Tolerance: Patient limited by fatigue Patient left: in bed;with call bell/phone within reach;with bed alarm set;with nursing/sitter in room;with family/visitor present Nurse Communication: Mobility status PT Visit Diagnosis: Unsteadiness on feet (R26.81);Difficulty in walking, not elsewhere classified (R26.2);Other abnormalities of gait and mobility (R26.89);Muscle weakness (generalized) (M62.81)     Time: 1246-1310 PT Time Calculation (min) (ACUTE ONLY): 24 min  Charges:    $Therapeutic Activity: 23-37 mins PT General Charges $$ ACUTE PT VISIT: 1 Visit                     Rankin Essex PTA 02/01/24, 3:25 PM

## 2024-02-02 DIAGNOSIS — R7881 Bacteremia: Secondary | ICD-10-CM | POA: Diagnosis not present

## 2024-02-02 DIAGNOSIS — B961 Klebsiella pneumoniae [K. pneumoniae] as the cause of diseases classified elsewhere: Secondary | ICD-10-CM | POA: Diagnosis not present

## 2024-02-02 DIAGNOSIS — B952 Enterococcus as the cause of diseases classified elsewhere: Secondary | ICD-10-CM | POA: Diagnosis not present

## 2024-02-02 DIAGNOSIS — D733 Abscess of spleen: Secondary | ICD-10-CM | POA: Diagnosis not present

## 2024-02-02 LAB — BASIC METABOLIC PANEL WITH GFR
Anion gap: 13 (ref 5–15)
BUN: 58 mg/dL — ABNORMAL HIGH (ref 8–23)
CO2: 25 mmol/L (ref 22–32)
Calcium: 8.6 mg/dL — ABNORMAL LOW (ref 8.9–10.3)
Chloride: 95 mmol/L — ABNORMAL LOW (ref 98–111)
Creatinine, Ser: 3.87 mg/dL — ABNORMAL HIGH (ref 0.61–1.24)
GFR, Estimated: 15 mL/min — ABNORMAL LOW
Glucose, Bld: 112 mg/dL — ABNORMAL HIGH (ref 70–99)
Potassium: 3.9 mmol/L (ref 3.5–5.1)
Sodium: 132 mmol/L — ABNORMAL LOW (ref 135–145)

## 2024-02-02 LAB — GLUCOSE, CAPILLARY
Glucose-Capillary: 94 mg/dL (ref 70–99)
Glucose-Capillary: 101 mg/dL — ABNORMAL HIGH (ref 70–99)
Glucose-Capillary: 114 mg/dL — ABNORMAL HIGH (ref 70–99)

## 2024-02-02 LAB — CBC
HCT: 27 % — ABNORMAL LOW (ref 39.0–52.0)
Hemoglobin: 8.3 g/dL — ABNORMAL LOW (ref 13.0–17.0)
MCH: 24.8 pg — ABNORMAL LOW (ref 26.0–34.0)
MCHC: 30.7 g/dL (ref 30.0–36.0)
MCV: 80.6 fL (ref 80.0–100.0)
Platelets: 225 K/uL (ref 150–400)
RBC: 3.35 MIL/uL — ABNORMAL LOW (ref 4.22–5.81)
RDW: 20.1 % — ABNORMAL HIGH (ref 11.5–15.5)
WBC: 9.9 K/uL (ref 4.0–10.5)
nRBC: 0 % (ref 0.0–0.2)

## 2024-02-02 LAB — PHOSPHORUS: Phosphorus: 3.8 mg/dL (ref 2.5–4.6)

## 2024-02-02 LAB — MAGNESIUM: Magnesium: 2.4 mg/dL (ref 1.7–2.4)

## 2024-02-02 LAB — LACTIC ACID, PLASMA: Lactic Acid, Venous: 1.7 mmol/L (ref 0.5–1.9)

## 2024-02-02 MED ORDER — EPOETIN ALFA-EPBX 10000 UNIT/ML IJ SOLN
4000.0000 [IU] | INTRAMUSCULAR | Status: DC
  Administered 2024-02-02: 4000 [IU] via INTRAVENOUS
  Filled 2024-02-02: qty 1

## 2024-02-02 MED ORDER — LIDOCAINE-PRILOCAINE 2.5-2.5 % EX CREA: 1.0000 | TOPICAL_CREAM | CUTANEOUS | Status: DC | PRN

## 2024-02-02 MED ORDER — MIDAZOLAM HCL (PF) 2 MG/2ML IJ SOLN
1.0000 mg | Freq: Once | INTRAMUSCULAR | Status: DC | PRN
  Filled 2024-02-02: qty 2

## 2024-02-02 MED ORDER — PENTAFLUOROPROP-TETRAFLUOROETH EX AERO: 1.0000 | INHALATION_SPRAY | CUTANEOUS | Status: DC | PRN

## 2024-02-02 MED ORDER — IOHEXOL 9 MG/ML PO SOLN
500.0000 mL | ORAL | Status: AC
  Administered 2024-02-02: 500 mL via ORAL

## 2024-02-02 MED ORDER — HEPARIN SODIUM (PORCINE) 1000 UNIT/ML DIALYSIS: 1000.0000 [IU] | INTRAMUSCULAR | Status: DC | PRN

## 2024-02-02 MED ORDER — LACTULOSE 10 GM/15ML PO SOLN
30.0000 g | Freq: Every day | ORAL | Status: DC
  Administered 2024-02-02 – 2024-02-03 (×2): 30 g via ORAL
  Filled 2024-02-02 (×2): qty 60

## 2024-02-02 NOTE — Progress Notes (Signed)
 PHARMACY CONSULT NOTE - FOLLOW UP  Pharmacy Consult for Electrolyte Monitoring and Replacement   Recent Labs: Potassium (mmol/L)  Date Value  02/02/2024 3.9  05/06/2012 4.1   Magnesium  (mg/dL)  Date Value  98/78/7973 2.4   Calcium  (mg/dL)  Date Value  98/78/7973 8.6 (L)   Calcium , Total (mg/dL)  Date Value  95/74/7985 9.5   Albumin  (g/dL)  Date Value  98/80/7973 3.1 (L)   Phosphorus (mg/dL)  Date Value  98/78/7973 3.8   Sodium (mmol/L)  Date Value  02/02/2024 132 (L)  05/06/2012 139     Assessment: 84 y/o M with medical history including ESRD on hemodialysis (MWF), T2DM, HTN, PAD, Afib on apixaban , chronic anemia, anxiety/depression, and BPH admitted with septic shock secondary to Enterococcal bacteremia. Pharmacy is asked to follow and replace electrolytes while in CCU  Goal of Therapy:  Electrolytes WNL  Plan:  --no electrolyte replacement warranted for today --recheck electrolytes in am  Adriana JONETTA Bolster ,PharmD Clinical Pharmacist 02/02/2024 7:40 AM

## 2024-02-02 NOTE — Progress Notes (Signed)
 [facility] CLINIC CARDIOLOGY PROGRESS NOTE   Patient ID: Jeffery Joseph MRN: [id number] DOB/AGE: Aug 31, 1940 84 y.o.  Admit date: 01/22/2024 Referring Physician Dr. Madison Peaches Primary Physician Epifanio Alm SQUIBB, MD  Primary Cardiologist Dr. Florencio Reason for Consultation AoCHF  HPI: Jeffery Joseph is a 84 y.o. 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 Cardiology was consulted for further evaluation.   Interval History:  - Patient seen and examined this morning, resting comfortably in hospital bed.  - Remains on levo but overall lower dose. Reports some mild nausea, states this is improved from yesterday. - Denies any chest discomfort, SOB, or palpitations. HR overall stable.  Review of systems complete and found to be negative unless listed above   Vitals:   02/02/24 0730 02/02/24 0745 02/02/24 0800 02/02/24 0815  BP: (!) 94/52 (!) 98/50 (!) 99/46 (!) 104/52  Pulse: (!) 107  (!) 109   Resp: 16 12 14 17   Temp: 97.6 F (36.4 C)     TempSrc: Axillary     SpO2: 97%  100%   Weight:      Height:         Intake/Output Summary (Last 24 hours) at 02/02/2024 0859 Last data filed at 02/02/2024 0600 Gross per 24 hour  Intake 880.48 ml  Output 150 ml  Net 730.48 ml     PHYSICAL EXAM General: Chronically ill-appearing male, well nourished, in no acute distress. HEENT: Normocephalic and atraumatic. Neck: No JVD.  Lungs: Normal respiratory effort on room air. Clear bilaterally to auscultation. No wheezes, crackles, rhonchi.  Heart: HRR, elevated rate. Normal S1 and S2 without gallops or murmurs. Radial & DP pulses 2+ bilaterally. Abdomen: Non-distended appearing.  Msk: Normal strength and tone for age. Extremities: No clubbing, cyanosis or edema.   Neuro: Alert and oriented X 3. Psych: Mood  appropriate, affect congruent.    LABS: Basic Metabolic Panel: Recent Labs    01/31/24 0823 02/01/24 0524 02/02/24 0510  NA 128* 132* 132*  K 4.4 4.0 3.9  CL 92* 95* 95*  CO2 24 26 25   GLUCOSE 189* 174* 112*  BUN 56* 46* 58*  CREATININE 4.08* 3.32* 3.87*  CALCIUM  9.0 8.7* 8.6*  MG  --  2.3 2.4  PHOS 4.7*  --  3.8   Liver Function Tests: Recent Labs    01/31/24 0823  ALBUMIN  3.1*    No results for input(s): LIPASE, AMYLASE in the last 72 hours. CBC: Recent Labs    02/01/24 0524 02/02/24 0510  WBC 9.9 9.9  HGB 8.0* 8.3*  HCT 25.9* 27.0*  MCV 80.4 80.6  PLT 242 225   Cardiac Enzymes: No results for input(s): CKTOTAL, CKMB, CKMBINDEX, TROPONINIHS in the last 72 hours. BNP: No results for input(s): BNP in the last 72 hours. D-Dimer: No results for input(s): DDIMER in the last 72 hours. Hemoglobin A1C: No results for input(s): HGBA1C in the last 72 hours.  Fasting Lipid Panel: No results for input(s): CHOL, HDL, LDLCALC, TRIG, CHOLHDL, LDLDIRECT in the last 72 hours.  Thyroid Function Tests: No results for input(s): TSH, T4TOTAL, T3FREE, THYROIDAB in the last 72 hours.  Invalid input(s): FREET3 Anemia Panel: No results for input(s): VITAMINB12, FOLATE, FERRITIN, TIBC, IRON, RETICCTPCT in the last 72 hours.   No results found.    ECHO 07/2023: 1. Left ventricular ejection fraction,  by estimation, is 55 to 60%. The left ventricle has normal function. The left ventricle has no regional wall motion abnormalities. The left ventricular internal cavity size was mildly dilated. There is moderate concentric left ventricular hypertrophy. Left ventricular diastolic parameters are consistent with Grade III diastolic dysfunction  (restrictive).   2. Right ventricular systolic function is normal. The right ventricular  size is normal.   3. Left atrial size was moderately dilated.   4. Right atrial size was mild to  moderately dilated.   5. The mitral valve is grossly normal. Trivial mitral valve regurgitation.   6. Tricuspid valve regurgitation is mild to moderate.   7. The aortic valve is grossly normal. Aortic valve regurgitation is  mild. Mild aortic valve stenosis.    TELEMETRY (personally reviewed): Atrial flutter rate 100s  EKG (personally reviewed): Atrial flutter rate 111 bpm, PVCs  DATA reviewed by me 02/02/24: last 24h vitals tele labs imaging I/O, hospitalist progress note, PCCM notes  Principal Problem:   Chest pain Active Problems:   Chronic atrial fibrillation (HCC)   Type 2 diabetes mellitus with chronic kidney disease, without long-term current use of insulin  (HCC)   BPH (benign prostatic hyperplasia)   End-stage renal disease on hemodialysis (HCC)   Influenza   Pressure injury of skin   Bacteremia   Splenic abscess   Bacteremia due to Enterococcus    ASSESSMENT AND PLAN: Jeffery Joseph is a 84 y.o. 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 Cardiology was consulted for further evaluation.   # Chronic atrial flutter # Acute on chronic HFpEF # ESRD on HD # Anemia Patient presented with worsening shortness of breath and chest tightness.  EKG without acute ischemic changes but did demonstrate atrial flutter which he has a known history of.  Hemoglobin was 4.5 on admission and he received transfusion, up to 9.1 this a.m.  Started on IV heparin . - Continue Eliquis  2.5 mg twice daily for stroke risk reduction (dose appropriate given age, renal function). - MRI brain revealed acute stroke, CTA suggestive of splenic infarct. S/p IR drain 01/26/24.  Blood cultures positive for Enterococcus faecalis.  ID recommending TEE, however given his poor baseline status, continued vasopressor requirement  and considering he would not be valve surgery candidate, will defer TEE. This was discussed with family over the weekend by Dr. Florencio. - Suspect troponin secondary to demand ischemia.  - Continue amiodarone  100 mg daily.  Dose was decreased at outpatient appointment due to borderline slow heart rate.  Could consider transitioning to IV infusions however rates have remained stable and he appears relatively asymptomatic. - Continue atorvastatin  80 mg daily. - HD as per nephrology, appreciate their recommendations.  This patient's case was discussed and created with Dr. Florencio and he is in agreement.  Signed:  Danita Bloch, PA-C  02/02/2024, 8:59 AM [facility] Cardiology

## 2024-02-02 NOTE — Progress Notes (Signed)
 "  NAME:  Jeffery Joseph, MRN:  [id number], DOB:  [birth year], LOS: 10 ADMISSION DATE:  01/22/2024, CONSULTATION DATE: 01/24/2024 REFERRING MD: Dr. 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 Symptoms began several days prior to presentation. Patient was taking oseltamivir  for possible influenza. He reported dry cough and denied nausea, vomiting, abdominal pain, diarrhea, melena, or hematochezia. Last hemodialysis session was on Friday prior to admission.  ED Course: On arrival, vital signs: BP 106/49 mmHg, HR 111 bpm, RR 20, T 33F, SpO? 100% on room air. Initial labs showed WBC 11.8 K/L, Hgb 8.0 g/dL, platelets 790 K/L, sodium 134 mmol/L, potassium 3.5 mmol/L, BUN 46 mg/dL, creatinine 4.7 mg/dL, lactate 2.2 mmol/L, AST 73 U/L, ALT 70 U/L. Chest X-ray demonstrated cardiomegaly without acute consolidation. CT chest without contrast showed cardiomegaly and no acute airspace disease. Patient received 500 mL IV normal saline bolus, famotidine , and antacids and was admitted to TRH service for observation.   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), T2DM, HTN, PAD, chronic atrial fibrillation on anticoagulation, chronic anemia, anxiety/depression, and BPH   Micro Data  COVID/Influenza A&B/RSV 01/11>>negative  RVP 01/11>>negative  Blood x2 01/11>>enterococcus Faecalis  MRSA PCR 01/12>>negative  Tracheal aspirate 01/13>>  Significant Hospital Events: Including procedures, antibiotic start and stop dates in addition to other pertinent events   1/11: Admitted to trh service 1/12: Became hypotensive  started on levophed , critical care consulted 1/13: Pt continues to require levophed  gtt complains of his stomach not feeling good CT   Abd Pelvis pending concerning for splenic fluid collection concerning for possible abscess, Surgery and IR consulted, recommends aspiration and drain placement.  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 1/14: No acute events overnight, afebrile, remains on Levophed  (weaned to 6 mcg).  More awake and alert, consult Neurology for acute CVA on MRI yesterday.  Tentative plan for IR to perform splenic aspiration/drain today.  Cardiology tentatively planning for TEE tomorrow. 01/27/24- patient with sepsis bacteremia, for TEE today.   Had splenic drain (cultures with GPC+) and dialysis yesterday. ID on case is on ampicillin .  Concern for AV graft fistula contamination.   01/28/24- less output from splenic drain this am, making some urine and gets dialysis.  He's on levophed  14mcg. Starting solucortef for suspected arenal insufficiency.   01/30/24- patient is on levophed  weaned from 12 to 9 this morning.  Mental status is with intermittent confusion.  Cbc with stable leukocytosis and chornic anemia, abscess culture with klebsiella , blood culture with enterococcus.  Treated with ampicillin /rocephin .  Family at bedside today, declined IV fluids post discussion of medical plan. Remains on midodrine  15tid and levophed . Poor prognosis DNR/DNI working with palliative.  01/31/24- patient reports constipation.  He is hypotensive on levophed  and is on day 4 abx with ampicillin  and rocephin  BID.  02/01/24- mental status is improved, levophed  is down to 4.  Continuing midodrine .  Splenic drain is removed. Cardiology, nephrology, ID is following. Amp/rocephin  day 5.  HD on MWF.  02/02/24- patient is better this morning, he requests left TLC to be removed. We will do that after HD. He's off pressors this am.  Objective    Blood pressure (!) 92/48, pulse (!) 109,  temperature 97.6 F (36.4 C), temperature source Axillary, resp. rate (!) 22, height 6' 5 (1.956 m), weight 107.5 kg, SpO2 100%. CVP:  [9 mmHg-52 mmHg] 20 mmHg      Intake/Output Summary (Last 24 hours) at 02/02/2024 1337 Last data filed at 02/02/2024 0600 Gross per 24 hour  Intake 276.16 ml  Output 150 ml  Net 126.16 ml   Filed Weights   01/31/24 1200 02/01/24 0500 02/02/24 0500  Weight: 108.2 kg 108 kg 107.5 kg    Examination: General: Acute on chronically-ill appearing male, NAD on RA  HENT: Supple, no JVD  Lungs: Diminished throughout, even, non labored  Cardiovascular: Sinus tachycardia, no m/r/g, 2+ radial/1+, trace generalized edema Abdomen: Hypoactive BS x4, obese, taut, non tender  Extremities: Normal bulk and tone, moves all extremities  Neuro: Alert and oriented x1, follows commands, PERRLA GU: Deferred   Specimen Description ABSCESS Performed at [facility], [address], [place], [place] [address]  Special Requests Normal WD Performed at [facility], [address] [place], [place] [address]  Gram Stain MODERATE WBC PRESENT, PREDOMINANTLY PMN MODERATE GRAM POSITIVE COCCI Performed at [facility], [address] [place], [place] [address]  Culture ABUNDANT KLEBSIELLA PNEUMONIAE ABUNDANT ENTEROCOCCUS FAECALIS NO ANAEROBES ISOLATED; CULTURE IN PROGRESS FOR 5 DAYS  Report Status PENDING  Organism ID, Bacteria KLEBSIELLA PNEUMONIAE    Blood Culture ID Panel (Reflexed) Order: [id number]  Status: Final result     Next appt: 04/20/2024 at 08:30 AM in Vascular Surgery ([facility] VASC 3)   Test Result Released: Yes (not seen)   0 Result Notes    Component Ref Range & Units (hover) 6 d ago  Enterococcus faecalis DETECTED Abnormal   Comment: CRITICAL RESULT CALLED TO, READ BACK BY AND VERIFIED WITH: MAYA CHARLENA NAPOLEON [phone] @ 2318 FH     Assessment and Plan   #Acute CVA #Dementia #Depression  MRI Brain 01/25/24: 5 mm acute  infarct at the left temporooccipital junction: Mild chronic small vessel ischemic disease. -Treatment of metabolic derangements as outlined above -Provide supportive care -Promote normal sleep/wake cycle and family presence -Avoid sedating medications as able -Neurology consulted, appreciate input  -Continue outpatient donepezil  and prn xanax    #Shock: Septic and hypovolemic -present on admission - due to bacteremia and spelnic abscess- on ampiccillin and rocephin  . ID on case appreciate input Dr Fayette           Lactate normal 01/29/24 , mentation with baseline dementia and acute critical illness acceptable with GCS12 -s/p PCD and IV abx, may need splenectomy if fails #Elevated troponin due to demand ischemia  #Chronic atrial flutter/fibrillation   #Acute on chronic HFpEF  Hx: HTN and PAD  -Echocardiogram 01/25/24: LVEF 45 to 50%, grade 2 diastolic dysfunction, RV systolic function is low normal, RV size mildly enlarged mild to moderate mitral valve regurgitation, mild to moderate tricuspid valve regurgitation, mild aortic valve regurgitation, mild aortic valve stenosis -Continuous cardiac monitoring -Maintain MAP >65 -Cautious IV fluids -Vasopressors as needed to maintain MAP goal -Continue Midodrine   -Trend lactic acid until normalized -HS Troponin peaked at 789 -Volume removal with dialysis -Cardiology following, appreciate inpnt -Continue scheduled amiodarone  and finasteride  -Holding Anticoagulation for now given pending IR aspiration of splenic abscess and new CVA  #Previous influenza infection  -repeat cxr  #Pulmonary edema  - Supplemental O2 for dyspnea and/or hypoxia - Maintain O2 sats 92% or higher  -  Prn CXR's  - Fluid removal as tolerated during HD   #ESRD on HD  #Hypokalemia  #Hyponatremia  #Hypomagnesia  - Trend BMP  - Replace electrolytes as indicated  - Strict I&O's - Nephrology consulted appreciate input: HD per recommendations   #Anemia of chronic  kidney disease  - Trend CBC  - Monitor for s/sx of bleeding  - Transfuse for hgb <7  Type II diabetes mellitus  - CBG's ac/hs - SSI  - Target CBG readings 140 to 180 - Follow hypo/hyperglycemic protocol     Labs   CBC: Recent Labs  Lab 01/30/24 0420 01/30/24 0900 01/31/24 0549 02/01/24 0524 02/02/24 0510  WBC 12.3* 12.9* 10.7* 9.9 9.9  HGB 8.2* 8.3* 8.3* 8.0* 8.3*  HCT 25.9* 26.4* 26.6* 25.9* 27.0*  MCV 78.5* 79.3* 79.2* 80.4 80.6  PLT 243 248 255 242 225    Basic Metabolic Panel: Recent Labs  Lab 01/27/24 0505 01/28/24 0425 01/29/24 0441 01/30/24 0420 01/31/24 0823 02/01/24 0524 02/02/24 0510  NA 132* 130* 132* 129* 128* 132* 132*  K 3.8 4.0 4.2 4.1 4.4 4.0 3.9  CL 94* 93* 95* 93* 92* 95* 95*  CO2 27 26 25 25 24 26 25   GLUCOSE 135* 146* 164* 205* 189* 174* 112*  BUN 26* 30* 26* 39* 56* 46* 58*  CREATININE 2.97* 3.53* 2.98* 3.59* 4.08* 3.32* 3.87*  CALCIUM  8.8* 8.7* 8.7* 9.2 9.0 8.7* 8.6*  MG 1.9 2.2 2.2  --   --  2.3 2.4  PHOS 3.1 3.8  --   --  4.7*  --  3.8   GFR: Estimated Creatinine Clearance: 19.4 mL/min (A) (by C-G formula based on SCr of 3.87 mg/dL (H)). Recent Labs  Lab 01/29/24 1148 01/30/24 0420 01/30/24 0900 01/31/24 0549 02/01/24 0524 02/02/24 0510  WBC  --    < > 12.9* 10.7* 9.9 9.9  LATICACIDVEN 1.0  --   --   --   --   --    < > = values in this interval not displayed.    Liver Function Tests: Recent Labs  Lab 01/27/24 0505 01/31/24 0823  ALBUMIN  2.6* 3.1*   No results for input(s): LIPASE, AMYLASE in the last 168 hours. No results for input(s): AMMONIA in the last 168 hours.  ABG    Component Value Date/Time   HCO3 27.2 01/23/2024 2300   TCO2 17 (L) 11/14/2021 0718   O2SAT 98.3 01/29/2024 1255     Coagulation Profile: Recent Labs  Lab 01/28/24 1400  INR 1.5*    Cardiac Enzymes: No results for input(s): CKTOTAL, CKMB, CKMBINDEX, TROPONINI in the last 168 hours.  HbA1C: Hgb A1c MFr Bld   Date/Time Value Ref Range Status  01/23/2024 04:30 AM 5.9 (H) 4.8 - 5.6 % Final    Comment:    (NOTE) Diagnosis of Diabetes The following HbA1c ranges recommended by the American Diabetes Association (ADA) may be used as an aid in the diagnosis of diabetes mellitus.  Hemoglobin             Suggested A1C NGSP%              Diagnosis  <5.7                   Non Diabetic  5.7-6.4                Pre-Diabetic  >6.4                   Diabetic  <  7.0                   Glycemic control for                       adults with diabetes.    01/23/2024 03:23 AM 5.7 (H) 4.8 - 5.6 % Final    Comment:    (NOTE) Diagnosis of Diabetes The following HbA1c ranges recommended by the American Diabetes Association (ADA) may be used as an aid in the diagnosis of diabetes mellitus.  Hemoglobin             Suggested A1C NGSP%              Diagnosis  <5.7                   Non Diabetic  5.7-6.4                Pre-Diabetic  >6.4                   Diabetic  <7.0                   Glycemic control for                       adults with diabetes.      CBG: Recent Labs  Lab 02/01/24 1148 02/01/24 1632 02/01/24 2115 02/02/24 0728 02/02/24 1158  GLUCAP 177* 151* 132* 94 101*    Review of Systems: Positives in BOLD   Gen: Denies fever, chills, weight change, fatigue, night sweats HEENT: Denies blurred vision, double vision, hearing loss, tinnitus, sinus congestion, rhinorrhea, sore throat, neck stiffness, dysphagia PULM: Denies shortness of breath, cough, sputum production, hemoptysis, wheezing CV: Denies chest pain, edema, orthopnea, paroxysmal nocturnal dyspnea, palpitations GI: abdominal discomfort, nausea, vomiting, diarrhea, hematochezia, melena, constipation, change in bowel habits GU: Denies dysuria, hematuria, polyuria, oliguria, urethral discharge Endocrine: Denies hot or cold intolerance, polyuria, polyphagia or appetite change Derm: Denies rash, dry skin, scaling or peeling  skin change Heme: Denies easy bruising, bleeding, bleeding gums Neuro: Denies headache, numbness, weakness, slurred speech, loss of memory or consciousness  Past Medical History:  He,  has a past medical history of Anxiety, Arthritis, Depression, Diabetes mellitus without complication (HCC), End stage renal disease (HCC), Hypertension, Pneumonia, and Renal disorder.   Surgical History:   Past Surgical History:  Procedure Laterality Date   A/V FISTULAGRAM Left 06/08/2023   Procedure: A/V Fistulagram;  Surgeon: Jama Cordella MATSU, MD;  Location: [facility] INVASIVE CV LAB;  Service: Cardiovascular;  Laterality: Left;   A/V SHUNT INTERVENTION Left 09/29/2023   Procedure: A/V SHUNT INTERVENTION;  Surgeon: Marea Selinda RAMAN, MD;  Location: [facility] INVASIVE CV LAB;  Service: Cardiovascular;  Laterality: Left;   APPLICATION OF WOUND VAC Right 11/14/2021   Procedure: APPLICATION OF WOUND VAC;  Surgeon: Jama Cordella MATSU, MD;  Location: [facility] ORS;  Service: Vascular;  Laterality: Right;   AV FISTULA PLACEMENT Left 11/14/2021   Procedure: ARTERIOVENOUS (AV) FISTULA CREATION ( BRACHIAL CEPHALIC);  Surgeon: Jama Cordella MATSU, MD;  Location: [facility] ORS;  Service: Vascular;  Laterality: Left;     Social History:   reports that he has never smoked. He has never used smokeless tobacco. He reports that he does not currently use drugs. He reports that he does not drink alcohol .   Family History:  His family history is not on file.   Allergies  Allergies[1]   Home Medications  Prior to Admission medications  Medication Sig Start Date End Date Taking? Authorizing Provider  acetaminophen  (TYLENOL ) 325 MG tablet Take 650 mg by mouth every 6 (six) hours as needed.   Yes [provider]  ALPRAZolam  (XANAX ) 0.5 MG tablet Take 0.5 mg by mouth 3 (three) times daily as needed for sleep. 02/05/21  Yes [provider]  amiodarone  (PACERONE ) 200 MG tablet Take 2 tablets (400 mg total) by mouth 2 (two) times daily  for 7 days, THEN 1 tablet (200 mg total) daily. 07/24/23 01/22/24 Yes Sreenath, Sudheer B, MD  apixaban  (ELIQUIS ) 2.5 MG TABS tablet Take 1 tablet (2.5 mg total) by mouth 2 (two) times daily. 07/24/23 01/23/24 Yes Sreenath, Sudheer B, MD  Cholecalciferol  125 MCG (5000 UT) TABS Take 5,000 Units by mouth daily.   Yes [provider]  clopidogrel  (PLAVIX ) 75 MG tablet Take 1 tablet (75 mg total) by mouth daily. 06/09/23  Yes Schnier, Cordella MATSU, MD  diclofenac  Sodium (VOLTAREN ) 1 % GEL Apply 2 g topically 4 (four) times daily. 07/24/23  Yes Sreenath, Sudheer B, MD  Docusate Sodium  (DSS) 100 MG CAPS Take 100 mg by mouth at bedtime.   Yes [provider]  donepezil  (ARICEPT ) 10 MG tablet Take 10 mg by mouth at bedtime. 05/14/21  Yes [provider]  finasteride  (PROSCAR ) 5 MG tablet Take 1 tablet (5 mg total) by mouth daily. 08/12/19  Yes Stoioff, Glendia BROCKS, MD  lidocaine -prilocaine  (EMLA ) cream Apply 1 Application topically as needed (Dialysis). 08/18/23  Yes [provider]  metoprolol  succinate (TOPROL -XL) 25 MG 24 hr tablet Take 0.5 tablets (12.5 mg total) by mouth daily. 07/24/23 01/22/24 Yes Sreenath, Sudheer B, MD  tamsulosin  (FLOMAX ) 0.4 MG CAPS capsule Take 1 capsule (0.4 mg total) by mouth daily. 08/12/19  Yes Stoioff, Glendia BROCKS, MD  enalapril  (VASOTEC ) 20 MG tablet Take 20 mg by mouth 2 (two) times daily. Patient not taking: Reported on 01/20/2024 08/12/21   [provider]  furosemide  (LASIX ) 40 MG tablet Take 1 tablet (40 mg total) by mouth every other day. Patient not taking: Reported on 01/20/2024 07/25/23   Jhonny Calvin NOVAK, MD  glipiZIDE  (GLUCOTROL  XL) 5 MG 24 hr tablet Take by mouth. Patient not taking: Reported on 01/20/2024 12/01/11   [provider]  LOKELMA 10 g PACK packet Take 1 packet by mouth daily. Patient not taking: Reported on 01/20/2024 07/12/23   [provider]  sodium bicarbonate  650 MG tablet Take 650 mg by mouth 2 (two) times  daily. Patient not taking: Reported on 01/20/2024 06/16/23   [provider]     Critical care provider statement:   Total critical care time: 33 minutes   Performed by: Parris MD   Critical care time was exclusive of separately billable procedures and treating other patients.   Critical care was necessary to treat or prevent imminent or life-threatening deterioration.   Critical care was time spent personally by me on the following activities: development of treatment plan with patient and/or surrogate as well as nursing, discussions with consultants, evaluation of patient's response to treatment, examination of patient, obtaining history from patient or surrogate, ordering and performing treatments and interventions, ordering and review of laboratory studies, ordering and review of radiographic studies, pulse oximetry and re-evaluation of patient's condition.    Terria Deschepper, M.D.  Pulmonary & Critical Care Medicine                     [  1] No Known Allergies  "

## 2024-02-02 NOTE — Plan of Care (Signed)
" °  Problem: Coping: Goal: Ability to adjust to condition or change in health will improve Outcome: Progressing   Problem: Fluid Volume: Goal: Ability to maintain a balanced intake and output will improve Outcome: Progressing   Problem: Health Behavior/Discharge Planning: Goal: Ability to identify and utilize available resources and services will improve Outcome: Progressing   Problem: Metabolic: Goal: Ability to maintain appropriate glucose levels will improve Outcome: Progressing   Problem: Nutritional: Goal: Maintenance of adequate nutrition will improve Outcome: Progressing   Problem: Tissue Perfusion: Goal: Adequacy of tissue perfusion will improve Outcome: Progressing   Problem: Clinical Measurements: Goal: Respiratory complications will improve Outcome: Progressing   Problem: Activity: Goal: Risk for activity intolerance will decrease Outcome: Progressing   Problem: Coping: Goal: Level of anxiety will decrease Outcome: Progressing   Problem: Pain Managment: Goal: General experience of comfort will improve and/or be controlled Outcome: Progressing   Problem: Safety: Goal: Ability to remain free from injury will improve Outcome: Progressing   "

## 2024-02-02 NOTE — Progress Notes (Signed)
 Physical Therapy Treatment Patient Details Name: Jeffery Joseph MRN: [id number] DOB: September 12, 1940 Today's Date: 02/02/2024   History of Present Illness Pt is an 84 y/o M admitted on 01/22/24 after presenting with c/o acute onset dyspnea & chest tightness. Pt admitted for observation, became hypotensive. CT concerning for splenic fluid collection/possible abscess, s/p aspiration & drain placement on 01/26/24. 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 Splenic abscess culture with klebsiella, blood cultur with enterococcus. PMH: ESRD on HD MWF, DM2, HTN, PAD, chronic a-fib, chronic anemia, anxiety, depression, BPH, dementia    PT Comments  PT/OT cotreat 2/2 to need for +2 assistance pt's poor activity tolerance. He was long sitting in bed, alert, and agreeable to PT/OT co-treat. Pt remains HOH and does present with baseline cognitive deficits. Supportive daughter present and will be assisting pt at home. Author gave gait belt to daughter for home use and discussed at length PLOF. Pt remains far form baseline abilities however is progressing. He tolerated getting OOB, standing to RW, and two very short gait distances. Pt self limits distance 2/2 to fatigue. Discussed need for w/c at DC if family is planning to DC directly home. Overall pt tolerated session well with BP remaining stable. Poor pleth reading throughout session for ox sats. Acute PT will continue to follow and progress per current POC.     If plan is discharge home, recommend the following: Two people to help with walking and/or transfers;Assistance with feeding;Assistance with cooking/housework;Two people to help with bathing/dressing/bathroom;Direct supervision/assist for medications management;Direct supervision/assist for financial management;Assist for transportation;Help with stairs or ramp for entrance;Supervision due to cognitive status     Equipment Recommendations  Wheelchair (measurements PT);Wheelchair cushion (measurements  PT)       Precautions / Restrictions Precautions Precautions: Fall Recall of Precautions/Restrictions: Impaired Precaution/Restrictions Comments: LUE fistula/ CVP, cardiac tele Restrictions Weight Bearing Restrictions Per Provider Order: No     Mobility  Bed Mobility Overal bed mobility: Needs Assistance Bed Mobility: Supine to Sit, Sit to Supine  Supine to sit: Min assist Sit to supine: Min assist   Transfers Overall transfer level: Needs assistance Equipment used: Rolling walker (2 wheels) Transfers: Sit to/from Stand Sit to Stand: Contact guard assist, Min assist, From elevated surface  General transfer comment: Pt required CGA-min assist but with vcs demonstrated improved transfer from recliner on 2nd attempt. issued family gait belt for home use    Ambulation/Gait Ambulation/Gait assistance: Contact guard assist, Min assist, +2 safety/equipment Gait Distance (Feet): 6 Feet Assistive device: Rolling walker (2 wheels) Gait Pattern/deviations: Step-to pattern, Narrow base of support, Trunk flexed  General Gait Details: Per family, pt does not walk far at baseline. He only tolerated ambulation 1 x 6 and then 1 x 42ft  with close chair follow. issued daughter gait belt for home use and encouraged them to have WC close by if they do elect to DC directly home from acute hospital    Balance Overall balance assessment: Needs assistance Sitting-balance support: Feet supported Sitting balance-Leahy Scale: Fair     Standing balance support: Bilateral upper extremity supported Standing balance-Leahy Scale: Fair       Hotel Manager: Impaired Factors Affecting Communication: Hearing impaired  Cognition Arousal: Alert Behavior During Therapy: Flat affect   PT - Cognitive impairments: History of cognitive impairments, Problem solving, Awareness, Safety/Judgement, Memory    PT - Cognition Comments: Pt is alert but does have some baseline cognitive  deficits Following commands: Impaired Following commands impaired: Follows one step commands with  increased time, Only follows one step commands consistently    Cueing Cueing Techniques: Verbal cues, Tactile cues         Pertinent Vitals/Pain Pain Assessment Pain Assessment: No/denies pain     PT Goals (current goals can now be found in the care plan section) Acute Rehab PT Goals Patient Stated Goal: go home Progress towards PT goals: Progressing toward goals    Frequency    Min 2X/week       Co-evaluation   Reason for Co-Treatment: Complexity of the patient's impairments (multi-system involvement);Necessary to address cognition/behavior during functional activity;To address functional/ADL transfers;For patient/therapist safety PT goals addressed during session: Mobility/safety with mobility;Balance;Proper use of DME;Strengthening/ROM OT goals addressed during session: ADL's and self-care      AM-PAC PT 6 Clicks Mobility   Outcome Measure  Help needed turning from your back to your side while in a flat bed without using bedrails?: None Help needed moving from lying on your back to sitting on the side of a flat bed without using bedrails?: A Little Help needed moving to and from a bed to a chair (including a wheelchair)?: A Little Help needed standing up from a chair using your arms (e.g., wheelchair or bedside chair)?: A Little Help needed to walk in hospital room?: Total Help needed climbing 3-5 steps with a railing? : Total 6 Click Score: 15    End of Session   Activity Tolerance: Patient limited by fatigue Patient left: in bed;with call bell/phone within reach;with bed alarm set;with nursing/sitter in room;with family/visitor present (Has HD shortly) Nurse Communication: Mobility status PT Visit Diagnosis: Unsteadiness on feet (R26.81);Difficulty in walking, not elsewhere classified (R26.2);Other abnormalities of gait and mobility (R26.89);Muscle weakness  (generalized) (M62.81)     Time: 9159-9094 PT Time Calculation (min) (ACUTE ONLY): 25 min  Charges:    $Gait Training: 8-22 mins $Therapeutic Activity: 8-22 mins PT General Charges $$ ACUTE PT VISIT: 1 Visit                    Rankin Essex PTA 02/02/24, 11:17 AM

## 2024-02-02 NOTE — Progress Notes (Addendum)
" °   02/02/24 1852  Vitals  Temp (!) 96.3 F (35.7 C)  Temp Source Oral  BP (!) 115/49  MAP (mmHg) 66  BP Location Right Arm  BP Method Automatic  Patient Position (if appropriate) Lying  Pulse Rate (!) 108  Pulse Rate Source Monitor  ECG Heart Rate (!) 110  Resp 15  Oxygen Therapy  SpO2 100 %  O2 Device Room Air  Patient Activity (if Appropriate) In bed  Pulse Oximetry Type Continuous  Oximetry Probe Site Changed No  During Treatment Monitoring  Blood Flow Rate (mL/min) 400 mL/min  Arterial Pressure (mmHg) -180.8 mmHg  Venous Pressure (mmHg) 189.89 mmHg  TMP (mmHg) 7.47 mmHg  Ultrafiltration Rate (mL/min) 533 mL/min  Dialysate Flow Rate (mL/min) 299 ml/min  Duration of HD Treatment -hour(s) 3.45 hour(s)  Cumulative Fluid Removed (mL) per Treatment  473.33  HD Safety Checks Performed Yes  Intra-Hemodialysis Comments Tx completed  Post Treatment  Dialyzer Clearance Lightly streaked  Hemodialysis Intake (mL) 0 mL  Liters Processed 84  Fluid Removed (mL) 500 mL  Tolerated HD Treatment Yes  Fistula / Graft Left Upper arm  No placement date or time found.   Orientation: Left  Access Location: Upper arm  Site Condition No complications  Fistula / Graft Assessment Present;Thrill;Bruit  Status Deaccessed;Flushed;Patent  Drainage Description None   Pt tolerated tx well. Removed 500 ml. Vss. Med given epo 4,000u  midodrine  15 mgs, report given to nurse.reported low temp in beginning to icu nurse. More blankets added.  Temp rechecked 97.5 Oral "

## 2024-02-02 NOTE — Progress Notes (Addendum)
 Occupational Therapy Treatment Patient Details Name: Jeffery Joseph MRN: [id number] DOB: [birth year] Today's Date: 02/02/2024   History of present illness Pt is an 84 y/o M admitted on 01/22/24 after presenting with c/o acute onset dyspnea & chest tightness. Pt admitted for observation, became hypotensive. CT concerning for splenic fluid collection/possible abscess, s/p aspiration & drain placement on 01/26/24. 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 Splenic abscess culture with klebsiella, blood cultur with enterococcus. PMH: ESRD on HD MWF, DM2, HTN, PAD, chronic a-fib, chronic anemia, anxiety, depression, BPH, dementia   OT comments  Chart reviewed to date, pt greeted with PT sitting on edge of bed, supportive daughter present. Pt is in agreement for OT tx session targeting improving functional activity tolerance in prep for ADL tasks. Improvements noted on this date as pt amb in room approx 8' with RW with MIN A and close chair follow, then additional 6' with RW and MIN A, close chair follow. Step pivot transfer to chair completed with MIN A and RW. Initial reports of dizziness with position changes, it does not worsen with amount of time upright. Good grip strength on R side, pt reports he has help eating but will attempt to feed himself on this date. Pt continues to perform ADL/functional mobility below PLOF, will benefit from acute OT to address functional deficits and to facilitate return to PLOF. Pt is left in bed, all needs met. OT will follow.   BP 123/69 HR 117bpm max, spo2 >90% on RA (?pleth at times however, pt does not report SOB), All lines/leads, CVP monitor intact pre/post session.      If plan is discharge home, recommend the following:  A lot of help with bathing/dressing/bathroom;A lot of help with walking and/or transfers;Two people to help with walking and/or transfers;Assist for transportation;Help with stairs or ramp for entrance   Equipment Recommendations   Wheelchair (measurements OT);Wheelchair cushion (measurements OT)    Recommendations for Other Services      Precautions / Restrictions Precautions Precautions: Fall Recall of Precautions/Restrictions: Impaired Precaution/Restrictions Comments: LUE fistula/ CVP, cardiac tele Restrictions Weight Bearing Restrictions Per Provider Order: No       Mobility Bed Mobility Overal bed mobility: Needs Assistance Bed Mobility: Supine to Sit, Sit to Supine       Sit to supine: Min assist, Used rails        Transfers Overall transfer level: Needs assistance Equipment used: Rolling walker (2 wheels) Transfers: Sit to/from Stand Sit to Stand: Contact guard assist, Min assist (multiple attempts)                 Balance Overall balance assessment: Needs assistance Sitting-balance support: Feet supported Sitting balance-Leahy Scale: Fair     Standing balance support: Bilateral upper extremity supported, During functional activity, Reliant on assistive device for balance Standing balance-Leahy Scale: Fair                             ADL either performed or assessed with clinical judgement   ADL Overall ADL's : Needs assistance/impaired                         Toilet Transfer: Minimal assistance;Rolling walker (2 wheels);+2 for safety/equipment;Ambulation Toilet Transfer Details (indicate cue type and reason): simulated         Functional mobility during ADLs: Minimal assistance;Rolling walker (2 wheels);+2 for safety/equipment;Cueing for sequencing;Cueing for safety (approx 8' in room,  then additional 6')      Extremity/Trunk Assessment              Vision       Perception     Praxis     Communication Communication Communication: Impaired Factors Affecting Communication: Hearing impaired   Cognition Arousal: Alert Behavior During Therapy: Flat affect Cognition: Cognition impaired         Attention impairment (select first  level of impairment): Selective attention Executive functioning impairment (select all impairments): Reasoning, Problem solving                   Following commands: Impaired Following commands impaired: Follows one step commands with increased time, Only follows one step commands consistently      Cueing   Cueing Techniques: Verbal cues, Tactile cues  Exercises Other Exercises Other Exercises: edu pt/daugther re AE/DME recommendations for safer ADL completion    Shoulder Instructions       General Comments      Pertinent Vitals/ Pain       Pain Assessment Pain Assessment: No/denies pain  Home Living                                          Prior Functioning/Environment              Frequency  Min 3X/week        Progress Toward Goals  OT Goals(current goals can now be found in the care plan section)  Progress towards OT goals: Progressing toward goals  Acute Rehab OT Goals Time For Goal Achievement: 02/14/24  Plan      Co-evaluation    PT/OT/SLP Co-Evaluation/Treatment: Yes Reason for Co-Treatment: Complexity of the patient's impairments (multi-system involvement);Necessary to address cognition/behavior during functional activity;To address functional/ADL transfers;For patient/therapist safety   OT goals addressed during session: ADL's and self-care      AM-PAC OT 6 Clicks Daily Activity     Outcome Measure   Help from another person eating meals?: A Little Help from another person taking care of personal grooming?: A Little Help from another person toileting, which includes using toliet, bedpan, or urinal?: A Lot Help from another person bathing (including washing, rinsing, drying)?: A Lot Help from another person to put on and taking off regular upper body clothing?: A Lot Help from another person to put on and taking off regular lower body clothing?: A Lot 6 Click Score: 14    End of Session Equipment Utilized During  Treatment: Rolling walker (2 wheels)  OT Visit Diagnosis: Other abnormalities of gait and mobility (R26.89);Muscle weakness (generalized) (M62.81);Unsteadiness on feet (R26.81)   Activity Tolerance Patient tolerated treatment well   Patient Left in bed;with call bell/phone within reach;with bed alarm set;with family/visitor present   Nurse Communication Mobility status        Time: 9158-9095 OT Time Calculation (min): 23 min  Charges: OT General Charges $OT Visit: 1 Visit OT Treatments $Therapeutic Activity: 8-22 mins  Therisa Sheffield, OTD OTR/L  02/02/24, 9:51 AM

## 2024-02-02 NOTE — Progress Notes (Signed)
 "  Date of Admission:  01/22/2024     ID: Jeffery Joseph is a 84 y.o. male Principal Problem:   Chest pain Active Problems:   Chronic atrial fibrillation (HCC)   Type 2 diabetes mellitus with chronic kidney disease, without long-term current use of insulin  (HCC)   BPH (benign prostatic hyperplasia)   End-stage renal disease on hemodialysis (HCC)   Influenza   Pressure injury of skin   Bacteremia   Splenic abscess   Bacteremia due to Enterococcus    Subjective: Daughter at bed side States he has nausea and feeling hot  Medications:   amiodarone   100 mg Oral Daily   apixaban   2.5 mg Oral BID   Chlorhexidine  Gluconate Cloth  6 each Topical Daily   cholecalciferol   5,000 Units Oral Daily   diclofenac  Sodium  2 g Topical QID   donepezil   10 mg Oral QHS   feeding supplement  237 mL Oral BID BM   finasteride   5 mg Oral Daily   insulin  aspart  0-15 Units Subcutaneous TID WC   insulin  aspart  0-5 Units Subcutaneous QHS   lactulose   30 g Oral Daily   midodrine   15 mg Oral TID WC   multivitamin  1 tablet Oral QHS   pantoprazole   40 mg Oral Daily   senna-docusate  2 tablet Oral q AM    Objective: Vital signs in last 24 hours: Patient Vitals for the past 24 hrs:  BP Temp Temp src Pulse Resp SpO2 Weight  02/02/24 1130 (!) 92/48 -- -- -- (!) 22 -- --  02/02/24 1100 (!) 103/47 -- -- -- 15 -- --  02/02/24 1030 (!) 102/46 -- -- (!) 109 12 100 % --  02/02/24 1000 (!) 100/45 -- -- (!) 108 12 100 % --  02/02/24 0930 (!) 107/49 -- -- (!) 109 (!) 21 94 % --  02/02/24 0915 -- -- -- (!) 110 14 100 % --  02/02/24 0900 (!) 123/59 -- -- -- 20 -- --  02/02/24 0845 (!) 127/46 -- -- (!) 110 13 (!) 88 % --  02/02/24 0834 (!) 100/47 -- -- (!) 108 (!) 21 93 % --  02/02/24 0830 (!) 101/46 -- -- -- (!) 27 -- --  02/02/24 0815 (!) 104/52 -- -- -- 17 -- --  02/02/24 0800 (!) 99/46 -- -- (!) 109 14 100 % --  02/02/24 0745 (!) 98/50 -- -- -- 12 -- --  02/02/24 0730 (!) 94/52 97.6 F (36.4 C) Axillary  (!) 107 16 97 % --  02/02/24 0715 -- -- -- (!) 109 16 100 % --  02/02/24 0600 (!) 97/51 -- -- (!) 108 18 100 % --  02/02/24 0545 (!) 94/50 -- -- (!) 105 16 100 % --  02/02/24 0530 (!) 167/156 -- -- (!) 107 (!) 9 92 % --  02/02/24 0515 (!) 100/46 -- -- -- 15 -- --  02/02/24 0500 (!) 90/40 -- -- (!) 108 12 100 % 107.5 kg  02/02/24 0445 (!) 93/49 -- -- (!) 103 14 100 % --  02/02/24 0430 (!) 99/46 -- -- (!) 108 14 100 % --  02/02/24 0415 (!) 96/47 -- -- (!) 109 18 100 % --  02/02/24 0400 (!) 88/45 97.9 F (36.6 C) Oral (!) 105 13 100 % --  02/02/24 0345 (!) 90/44 -- -- (!) 103 12 100 % --  02/02/24 0330 (!) 92/47 -- -- 100 11 100 % --  02/02/24 0315 (!) 97/45 -- -- [name]  105 10 100 % --  02/02/24 0300 (!) 98/47 -- -- (!) 108 (!) 8 100 % --  02/02/24 0245 (!) 100/49 -- -- (!) 108 12 100 % --  02/02/24 0230 (!) 103/49 -- -- (!) 108 12 100 % --  02/02/24 0215 (!) 100/47 -- -- 96 16 95 % --  02/02/24 0200 (!) 100/48 -- -- (!) 108 14 93 % --  02/02/24 0145 (!) 94/42 -- -- -- 13 -- --  02/02/24 0130 (!) 88/45 -- -- -- 13 -- --  02/02/24 0115 (!) 87/44 -- -- -- 15 -- --  02/02/24 0100 (!) 95/43 -- -- -- 11 -- --  02/02/24 0045 (!) 97/40 -- -- -- 14 -- --  02/02/24 0030 (!) 92/42 -- -- -- 11 -- --  02/02/24 0015 (!) 86/46 -- -- (!) 105 (!) 24 96 % --  02/02/24 0000 (!) 93/48 97.7 F (36.5 C) Oral (!) 108 (!) 21 100 % --  02/01/24 2345 (!) 94/45 -- -- -- 15 -- --  02/01/24 2330 (!) 98/46 -- -- (!) 106 16 100 % --  02/01/24 2315 (!) 96/48 -- -- (!) 108 18 100 % --  02/01/24 2300 (!) 99/50 -- -- (!) 106 (!) 25 100 % --  02/01/24 2245 (!) 98/46 -- -- (!) 106 15 100 % --  02/01/24 2230 (!) 92/42 -- -- (!) 108 (!) 27 100 % --  02/01/24 2215 (!) 96/42 -- -- (!) 108 13 100 % --  02/01/24 2200 (!) 96/51 -- -- (!) 107 12 100 % --  02/01/24 2145 (!) 95/51 -- -- (!) 108 16 100 % --  02/01/24 2130 (!) 94/46 -- -- (!) 106 (!) 21 100 % --  02/01/24 2115 (!) 96/50 -- -- (!) 107 (!) 21 100 % --  02/01/24  2100 (!) 103/50 -- -- (!) 108 17 100 % --  02/01/24 2045 (!) 98/47 -- -- (!) 106 16 100 % --  02/01/24 2030 (!) 100/48 -- -- (!) 106 13 100 % --  02/01/24 2015 (!) 103/50 -- -- (!) 108 16 100 % --  02/01/24 2000 (!) 93/51 97.9 F (36.6 C) Oral -- 16 100 % --  02/01/24 1945 (!) 96/48 -- -- -- 17 -- --  02/01/24 1930 (!) 97/44 -- -- (!) 105 13 100 % --  02/01/24 1915 (!) 93/45 -- -- -- 17 -- --  02/01/24 1900 (!) 104/49 -- -- -- 16 -- --  02/01/24 1830 (!) 101/49 -- -- (!) 107 12 100 % --  02/01/24 1815 (!) 111/52 -- -- (!) 106 19 (!) 79 % --  02/01/24 1800 (!) 103/49 -- -- 89 (!) 22 95 % --  02/01/24 1730 (!) 116/47 -- -- -- (!) 21 -- --  02/01/24 1715 (!) 95/49 -- -- -- 17 -- --  02/01/24 1700 (!) 110/43 -- -- (!) 110 18 100 % --  02/01/24 1645 (!) 110/51 -- -- (!) 111 14 100 % --  02/01/24 1630 (!) 102/48 -- -- (!) 109 10 100 % --  02/01/24 1615 (!) 99/44 -- -- (!) 109 16 99 % --  02/01/24 1600 (!) 99/44 97.7 F (36.5 C) Oral (!) 109 14 99 % --  02/01/24 1530 (!) 99/46 -- -- (!) 109 18 97 % --  02/01/24 1515 (!) 105/48 -- -- (!) 109 (!) 24 100 % --  02/01/24 1500 (!) 90/45 -- -- (!) 108 19 98 % --  02/01/24 1445 ROLLEN)  91/45 -- -- (!) 107 19 98 % --  02/01/24 1430 (!) 89/37 -- -- (!) 104 14 97 % --  02/01/24 1415 (!) 97/48 -- -- (!) 104 13 99 % --  02/01/24 1400 (!) 93/42 -- -- (!) 106 14 99 % --  02/01/24 1345 (!) 92/39 -- -- (!) 107 18 99 % --  02/01/24 1330 104/89 -- -- (!) 109 17 99 % --  02/01/24 1315 (!) 101/45 -- -- (!) 109 18 96 % --  02/01/24 1300 (!) 119/49 -- -- 94 13 (!) 85 % --  02/01/24 1245 (!) 98/49 -- -- -- 20 -- --  02/01/24 1240 -- -- -- -- 17 -- --  02/01/24 1215 (!) 98/47 -- -- (!) 109 16 93 % --  02/01/24 1200 (!) 102/55 97.8 F (36.6 C) Oral -- (!) 24 99 % --       PHYSICAL EXAM:  General: Alert, , no distress,.  Lungs: BiLateral air entry Heart: Tachycardic  abdomen: Soft,  drain removed  edema. No clubbing Skin: No rashes or lesions. Or  bruising Lymph: Cervical, supraclavicular normal. Neurologic: Grossly non-focal  Lab Results    Latest Ref Rng & Units 02/02/2024    5:10 AM 02/01/2024    5:24 AM 01/31/2024    5:49 AM  CBC  WBC 4.0 - 10.5 K/uL 9.9  9.9  10.7   Hemoglobin 13.0 - 17.0 g/dL 8.3  8.0  8.3   Hematocrit 39.0 - 52.0 % 27.0  25.9  26.6   Platelets 150 - 400 K/uL 225  242  255        Latest Ref Rng & Units 02/02/2024    5:10 AM 02/01/2024    5:24 AM 01/31/2024    8:23 AM  CMP  Glucose 70 - 99 mg/dL 887  825  810   BUN 8 - 23 mg/dL 58  46  56   Creatinine 0.61 - 1.24 mg/dL 6.12  6.67  5.91   Sodium 135 - 145 mmol/L 132  132  128   Potassium 3.5 - 5.1 mmol/L 3.9  4.0  4.4   Chloride 98 - 111 mmol/L 95  95  92   CO2 22 - 32 mmol/L 25  26  24    Calcium  8.9 - 10.3 mg/dL 8.6  8.7  9.0       Microbiology: 01/24/24 [facility] Enterococcus 4/4 Repeat blood culture  01/27/2023- NG Splenic abscess culture Klebsiella pneumoniae and enterococcus fecalis    Assessment/Plan: 84 y.o. 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   Enterococcus bacteremia -with sepsis  source could be AV FISTULA  On ceftriaxone  and ampicillin .  2 d echo no vegetation - cardiology wants him to  be treated as endocarditis with no TEE as he is not stable to have the procedure  Splenic abscess raises  concern for endocarditis  Splenic abscess-  IR placed drain and they removed it on 1/19 following their protocol-no imaging was repeated Pt now feels hot and nauseous Will repeat CT abdomen to look at the splenic abscess  Enterococcus and klebsiella pneumoniae in splenic abscess culture Klesiella susceptible to ceftriaxone    persistent hypotension On levo Midodrine  Stress dose steroids ( no cortisol level) Pt on tamsulosin - consider DC  ESRD on HD thru AVF   DM - management as per  primary team   Afib on  amiodarone    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

## 2024-02-02 NOTE — Progress Notes (Signed)
 " [facility]  PROGRESS NOTE   Subjective:   84 year old male with end-stage renal disease, anxiety, depression, type 2 diabetes originally admitted on 01/22/2023 for complaints of dyspnea, chest tightness. No complaints today.  Appears to be more alert today and clinically improved.  Currently on room air.    Objective:  Vital signs: Blood pressure (!) 104/52, pulse (!) 109, temperature 97.6 F (36.4 C), temperature source Axillary, resp. rate 17, height 6' 5 (1.956 m), weight 107.5 kg, SpO2 100%.  Intake/Output Summary (Last 24 hours) at 02/02/2024 0832 Last data filed at 02/02/2024 0600 Gross per 24 hour  Intake 973.74 ml  Output 150 ml  Net 823.74 ml   Filed Weights   01/31/24 1200 02/01/24 0500 02/02/24 0500  Weight: 108.2 kg 108 kg 107.5 kg     Physical Exam: General:  No acute distress  Head:  Normocephalic, atraumatic. Moist oral mucosal membranes  Eyes:  Anicteric  Lungs:   Clear to auscultation, normal effort  Heart: Tachycardic, regular  Abdomen:   Soft, nontender,   Extremities: Trace peripheral edema.  Neurologic:  Awake, alert, following commands  Skin:  No lesions  Access: Left upper arm AV fistula.    Basic Metabolic Panel: Recent Labs  Lab 01/27/24 0505 01/28/24 0425 01/29/24 0441 01/30/24 0420 01/31/24 0823 02/01/24 0524 02/02/24 0510  NA 132* 130* 132* 129* 128* 132* 132*  K 3.8 4.0 4.2 4.1 4.4 4.0 3.9  CL 94* 93* 95* 93* 92* 95* 95*  CO2 27 26 25 25 24 26 25   GLUCOSE 135* 146* 164* 205* 189* 174* 112*  BUN 26* 30* 26* 39* 56* 46* 58*  CREATININE 2.97* 3.53* 2.98* 3.59* 4.08* 3.32* 3.87*  CALCIUM  8.8* 8.7* 8.7* 9.2 9.0 8.7* 8.6*  MG 1.9 2.2 2.2  --   --  2.3 2.4  PHOS 3.1 3.8  --   --  4.7*  --  3.8   GFR: Estimated Creatinine Clearance: 19.4 mL/min (A) (by C-G formula based on SCr of 3.87 mg/dL (H)).  Liver Function Tests: Recent Labs  Lab 01/27/24 0505 01/31/24 0823  ALBUMIN  2.6* 3.1*   No results for input(s):  LIPASE, AMYLASE in the last 168 hours. No results for input(s): AMMONIA in the last 168 hours.  CBC: Recent Labs  Lab 01/30/24 0420 01/30/24 0900 01/31/24 0549 02/01/24 0524 02/02/24 0510  WBC 12.3* 12.9* 10.7* 9.9 9.9  HGB 8.2* 8.3* 8.3* 8.0* 8.3*  HCT 25.9* 26.4* 26.6* 25.9* 27.0*  MCV 78.5* 79.3* 79.2* 80.4 80.6  PLT 243 248 255 242 225     HbA1C: Hgb A1c MFr Bld  Date/Time Value Ref Range Status  01/23/2024 04:30 AM 5.9 (H) 4.8 - 5.6 % Final    Comment:    (NOTE) Diagnosis of Diabetes The following HbA1c ranges recommended by the American Diabetes Association (ADA) may be used as an aid in the diagnosis of diabetes mellitus.  Hemoglobin             Suggested A1C NGSP%              Diagnosis  <5.7                   Non Diabetic  5.7-6.4                Pre-Diabetic  >6.4                   Diabetic  <7.0  Glycemic control for                       adults with diabetes.    01/23/2024 03:23 AM 5.7 (H) 4.8 - 5.6 % Final    Comment:    (NOTE) Diagnosis of Diabetes The following HbA1c ranges recommended by the American Diabetes Association (ADA) may be used as an aid in the diagnosis of diabetes mellitus.  Hemoglobin             Suggested A1C NGSP%              Diagnosis  <5.7                   Non Diabetic  5.7-6.4                Pre-Diabetic  >6.4                   Diabetic  <7.0                   Glycemic control for                       adults with diabetes.      Urinalysis: No results for input(s): COLORURINE, LABSPEC, PHURINE, GLUCOSEU, HGBUR, BILIRUBINUR, KETONESUR, PROTEINUR, UROBILINOGEN, NITRITE, LEUKOCYTESUR in the last 72 hours.  Invalid input(s): APPERANCEUR    Imaging: No results found.    Medications:    ampicillin  (OMNIPEN) IV Stopped (02/02/24 0003)   cefTRIAXone  (ROCEPHIN )  IV Stopped (02/01/24 2216)   norepinephrine  (LEVOPHED ) Adult infusion Stopped (02/02/24 0501)     amiodarone   100 mg Oral Daily   apixaban   2.5 mg Oral BID   Chlorhexidine  Gluconate Cloth  6 each Topical Daily   cholecalciferol   5,000 Units Oral Daily   diclofenac  Sodium  2 g Topical QID   donepezil   10 mg Oral QHS   feeding supplement  237 mL Oral BID BM   finasteride   5 mg Oral Daily   insulin  aspart  0-15 Units Subcutaneous TID WC   insulin  aspart  0-5 Units Subcutaneous QHS   lactulose   30 g Oral Daily   midodrine   15 mg Oral TID WC   multivitamin  1 tablet Oral QHS   pantoprazole   40 mg Oral Daily   senna-docusate  2 tablet Oral q AM    Assessment/ Plan:     84 year old male with history of hypertension, coronary artery disease, atrial fibrillation, diabetes, anemia, anxiety/depression, BPH and end-stage renal disease on hemodialysis Monday Wednesday Friday schedule now admitted with history of chest tightness and shortness of breath.   #1: ESRD: Continue dialysis schedule Monday Wednesday and Friday.  Next dialysis will be later today than Friday.  Low UF goal.   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  Current hemoglobin 8.3.   #3: Secondary parathyroidism: Will monitor calcium , phosphorus and PTH.   #4: Sepsis: Patient has Enterococcus bacteremia.  Patient also has splenic abscess.  Minimal drainage therefore drain was removed by interventional radiology.  Presently on ampicillin  and Rocephin .  Outpatient regimen may need to be vancomycin  and oral quinolone to avoid another IV line for ampicillin  and ceftriaxone .  Once finalized, this will be conveyed to the outpatient dialysis center.   #5: Hypotension: Managed with midodrine .  Labs and medications reviewed. Will continue to follow along with you.   LOS:  10 Saralee Stank, MD [facility] 1/21/20268:32 AM  "

## 2024-02-03 ENCOUNTER — Inpatient Hospital Stay

## 2024-02-03 DIAGNOSIS — B961 Klebsiella pneumoniae [K. pneumoniae] as the cause of diseases classified elsewhere: Secondary | ICD-10-CM | POA: Diagnosis not present

## 2024-02-03 DIAGNOSIS — Z515 Encounter for palliative care: Secondary | ICD-10-CM

## 2024-02-03 DIAGNOSIS — N186 End stage renal disease: Secondary | ICD-10-CM | POA: Diagnosis not present

## 2024-02-03 DIAGNOSIS — R7881 Bacteremia: Secondary | ICD-10-CM | POA: Diagnosis not present

## 2024-02-03 DIAGNOSIS — I959 Hypotension, unspecified: Secondary | ICD-10-CM | POA: Diagnosis not present

## 2024-02-03 DIAGNOSIS — Z7189 Other specified counseling: Secondary | ICD-10-CM

## 2024-02-03 DIAGNOSIS — D733 Abscess of spleen: Secondary | ICD-10-CM | POA: Diagnosis not present

## 2024-02-03 DIAGNOSIS — R079 Chest pain, unspecified: Secondary | ICD-10-CM | POA: Diagnosis not present

## 2024-02-03 DIAGNOSIS — B952 Enterococcus as the cause of diseases classified elsewhere: Secondary | ICD-10-CM | POA: Diagnosis not present

## 2024-02-03 DIAGNOSIS — E1122 Type 2 diabetes mellitus with diabetic chronic kidney disease: Secondary | ICD-10-CM | POA: Diagnosis not present

## 2024-02-03 DIAGNOSIS — Z992 Dependence on renal dialysis: Secondary | ICD-10-CM | POA: Diagnosis not present

## 2024-02-03 LAB — TROPONIN T, HIGH SENSITIVITY: Troponin T High Sensitivity: 169 ng/L (ref 0–19)

## 2024-02-03 LAB — HEPATIC FUNCTION PANEL
ALT: 21 U/L (ref 0–44)
AST: 15 U/L (ref 15–41)
Albumin: 3.2 g/dL — ABNORMAL LOW (ref 3.5–5.0)
Alkaline Phosphatase: 66 U/L (ref 38–126)
Bilirubin, Direct: 0.2 mg/dL (ref 0.0–0.2)
Indirect Bilirubin: 0.1 mg/dL — ABNORMAL LOW (ref 0.3–0.9)
Total Bilirubin: 0.3 mg/dL (ref 0.0–1.2)
Total Protein: 6.6 g/dL (ref 6.5–8.1)

## 2024-02-03 LAB — BASIC METABOLIC PANEL WITH GFR
Anion gap: 13 (ref 5–15)
BUN: 39 mg/dL — ABNORMAL HIGH (ref 8–23)
CO2: 25 mmol/L (ref 22–32)
Calcium: 8.3 mg/dL — ABNORMAL LOW (ref 8.9–10.3)
Chloride: 93 mmol/L — ABNORMAL LOW (ref 98–111)
Creatinine, Ser: 3 mg/dL — ABNORMAL HIGH (ref 0.61–1.24)
GFR, Estimated: 20 mL/min — ABNORMAL LOW
Glucose, Bld: 116 mg/dL — ABNORMAL HIGH (ref 70–99)
Potassium: 4.1 mmol/L (ref 3.5–5.1)
Sodium: 131 mmol/L — ABNORMAL LOW (ref 135–145)

## 2024-02-03 LAB — GLUCOSE, CAPILLARY
Glucose-Capillary: 109 mg/dL — ABNORMAL HIGH (ref 70–99)
Glucose-Capillary: 115 mg/dL — ABNORMAL HIGH (ref 70–99)
Glucose-Capillary: 127 mg/dL — ABNORMAL HIGH (ref 70–99)
Glucose-Capillary: 124 mg/dL — ABNORMAL HIGH (ref 70–99)

## 2024-02-03 LAB — CBC
HCT: 26.7 % — ABNORMAL LOW (ref 39.0–52.0)
Hemoglobin: 8.2 g/dL — ABNORMAL LOW (ref 13.0–17.0)
MCH: 25.2 pg — ABNORMAL LOW (ref 26.0–34.0)
MCHC: 30.7 g/dL (ref 30.0–36.0)
MCV: 82.2 fL (ref 80.0–100.0)
Platelets: 216 K/uL (ref 150–400)
RBC: 3.25 MIL/uL — ABNORMAL LOW (ref 4.22–5.81)
RDW: 20.8 % — ABNORMAL HIGH (ref 11.5–15.5)
WBC: 7.6 K/uL (ref 4.0–10.5)
nRBC: 0 % (ref 0.0–0.2)

## 2024-02-03 LAB — APTT: aPTT: >200 s (ref 24–36)

## 2024-02-03 LAB — MAGNESIUM: Magnesium: 2.2 mg/dL (ref 1.7–2.4)

## 2024-02-03 LAB — PHOSPHORUS: Phosphorus: 3.5 mg/dL (ref 2.5–4.6)

## 2024-02-03 MED ORDER — GLYCOPYRROLATE 0.2 MG/ML IJ SOLN
0.2000 mg | INTRAMUSCULAR | Status: DC | PRN
  Administered 2024-02-04: 0.2 mg via INTRAVENOUS
  Filled 2024-02-03: qty 1

## 2024-02-03 MED ORDER — MORPHINE BOLUS VIA INFUSION
5.0000 mg | INTRAVENOUS | Status: DC | PRN
  Administered 2024-02-04 (×3): 5 mg via INTRAVENOUS

## 2024-02-03 MED ORDER — GLYCOPYRROLATE 0.2 MG/ML IJ SOLN: 0.2000 mg | INTRAMUSCULAR | Status: DC | PRN

## 2024-02-03 MED ORDER — IOHEXOL 300 MG/ML  SOLN
100.0000 mL | Freq: Once | INTRAMUSCULAR | Status: AC | PRN
  Administered 2024-02-03: 100 mL via INTRAVENOUS

## 2024-02-03 MED ORDER — HEPARIN (PORCINE) 25000 UT/250ML-% IV SOLN
1650.0000 [IU]/h | INTRAVENOUS | Status: DC
  Administered 2024-02-03: 1650 [IU]/h via INTRAVENOUS
  Filled 2024-02-03: qty 250

## 2024-02-03 MED ORDER — MIDAZOLAM HCL (PF) 2 MG/2ML IJ SOLN: 2.0000 mg | INTRAMUSCULAR | Status: DC | PRN

## 2024-02-03 MED ORDER — ACETAMINOPHEN 325 MG PO TABS: 650.0000 mg | ORAL_TABLET | Freq: Four times a day (QID) | ORAL | Status: DC | PRN

## 2024-02-03 MED ORDER — SODIUM CHLORIDE 0.9 % IV SOLN: INTRAVENOUS | Status: DC

## 2024-02-03 MED ORDER — POLYVINYL ALCOHOL 1.4 % OP SOLN: 1.0000 [drp] | Freq: Four times a day (QID) | OPHTHALMIC | Status: DC | PRN

## 2024-02-03 MED ORDER — HEPARIN (PORCINE) 25000 UT/250ML-% IV SOLN: 1250.0000 [IU]/h | INTRAVENOUS | Status: DC

## 2024-02-03 MED ORDER — ONDANSETRON 4 MG PO TBDP: 4.0000 mg | ORAL_TABLET | Freq: Four times a day (QID) | ORAL | Status: DC | PRN

## 2024-02-03 MED ORDER — MORPHINE 100MG IN NS 100ML (1MG/ML) PREMIX INFUSION
0.0000 mg/h | INTRAVENOUS | Status: DC
  Administered 2024-02-03: 5 mg/h via INTRAVENOUS
  Administered 2024-02-04: 10 mg/h via INTRAVENOUS
  Administered 2024-02-04: 20 mg/h via INTRAVENOUS
  Administered 2024-02-04: 15 mg/h via INTRAVENOUS
  Filled 2024-02-03 (×2): qty 100

## 2024-02-03 MED ORDER — GLYCOPYRROLATE 1 MG PO TABS: 1.0000 mg | ORAL_TABLET | ORAL | Status: DC | PRN

## 2024-02-03 MED ORDER — ACETAMINOPHEN 650 MG RE SUPP: 650.0000 mg | Freq: Four times a day (QID) | RECTAL | Status: DC | PRN

## 2024-02-03 MED ORDER — ONDANSETRON HCL 4 MG/2ML IJ SOLN: 4.0000 mg | Freq: Four times a day (QID) | INTRAMUSCULAR | Status: DC | PRN

## 2024-02-03 NOTE — Progress Notes (Signed)
  [facility] PRO[facility]ESS NOTE (cpt [phone])  Hospital Day(s): 11.   Interval History:  Patient well known to our service previously this admission for bacteremia and subsequent splenic abscess. He underwent drain placement on 01/14 with IR and subsequent removal on 01/19 after drainage slowed. Cx from drainage grew Klebsiella and Enterococcus; susceptibilities reviewed. ID is following. He is currently on Ampicillin  and Rocephin .  Patient continues to have relatively vague epigastric abdominal discomfort with intermittent nausea. No fever, chills, emesis. He has weaned from vasopressor support. Most recent labs reviewed a normal WBC at 7.6K (normalized x48 hours), Hgb to 8.2; stable, BMP consistent with ESRD, and venous lactate yesterday normal at 1.7. Last repeat blood Cx on 01/15 were without growth. He did undergo CT Abdomen/Pelvis last night which continued to have splenic collection but this is smaller. PCCM team discussed with IR and felt no need for repeat drainage at present time.   Review of Systems:  Unable to reliably perform secondary to dementia  Vital signs in last 24 hours: [min-max] current  Temp:  [96.2 F (35.7 C)-98.5 F (36.9 C)] 97.4 F (36.3 C) (01/22 0838) Pulse Rate:  [101-110] 101 (01/22 0845) Resp:  [9-26] 13 (01/22 0945) BP: (77-120)/(31-66) 106/66 (01/22 0945) SpO2:  [87 %-100 %] 95 % (01/22 0845) Weight:  [106.5 kg-107.4 kg] 106.5 kg (01/22 0500)     Height: 6' 5 (195.6 cm) Weight: 106.5 kg BMI (Calculated): 27.84   Intake/Output last 2 shifts:  01/21 0701 - 01/22 0700 In: 400 [IV Piggyback:400] Out: 900 [Urine:400]   Physical Exam:  Constitutional: Somnolent, arouses appropriately, pleasantly demented, NAD, daughter at bedside  HENT: normocephalic without obvious abnormality  Respiratory: breathing non-labored at rest; on Pelzer Cardiovascular: tachycardic and sinus rhythm  Gastrointestinal: Soft, he does not appear overtly tender,  certainly no discomfort appreciable in LUQ, non-distended, no rebound/guarding    Labs:     Latest Ref Rng & Units 02/03/2024    5:00 AM 02/02/2024    5:10 AM 02/01/2024    5:24 AM  CBC  WBC 4.0 - 10.5 K/uL 7.6  9.9  9.9   Hemoglobin 13.0 - 17.0 g/dL 8.2  8.3  8.0   Hematocrit 39.0 - 52.0 % 26.7  27.0  25.9   Platelets 150 - 400 K/uL 216  225  242       Latest Ref Rng & Units 02/03/2024    5:00 AM 02/02/2024    5:10 AM 02/01/2024    5:24 AM  CMP  Glucose 70 - 99 mg/dL 883  887  825   BUN 8 - 23 mg/dL 39  58  46   Creatinine 0.61 - 1.24 mg/dL 6.99  6.12  6.67   Sodium 135 - 145 mmol/L 131  132  132   Potassium 3.5 - 5.1 mmol/L 4.1  3.9  4.0   Chloride 98 - 111 mmol/L 93  95  95   CO2 22 - 32 mmol/L 25  25  26    Calcium  8.9 - 10.3 mg/dL 8.3  8.6  8.7      Imaging studies:   CT Abdomen/Pelvis (02/03/2024) personally reviewed with continued fluid collection in the spleen, smaller when compared to 01/13, and radiologist report reviewed below:  IMPRESSION: 1. Intraparenchymal fluid collection within the spleen with trace air, measuring 9.3 x 2.6 x 2.4 cm, larger previously, status post pigtail catheter removal. Persisting abscess not excluded. 2. Wedge-shaped fluid density focus in the lateral spleen, possibly infarct or subcapsular bleed,  decreased in size to 2.1 cm. 3. Extensive polycystic kidney disease, stable appearance, and consider MRI to exclude solid lesions. 4. Mild mesenteric congestive changes with increased body wall edema in the flanks and thighs, with small volume of pelvic ascites. 5. Cardiomegaly with small pleural effusions, and opacities in the lower lobes which could be atelectasis or pneumonia. 6. Enlarged prostate with bladder mass effect, with thickening in the bladder consistent with underdistention or hypertrophy. Underlying cystitis not strictly excluded.    Assessment/Plan:  84 y.o. male with resolving shock secondary to bacteremia, repeat Blood Cx  on 01/15 negative for growth, source unclear although found to have splenic fluid collection s/p percutaneous drainage on 01/14 (since removed) with persistent, but smaller, collection on 01/22 imaging, complicated by dementia, ESRD on hemodialysis (MWF), T2DM, HTN, PAD, chronic atrial fibrillation on anticoagulation, chronic anemia.    - Had a lengthy discussion with the patient's daughter at bedside this afternoon regarding recent work up and CT findings overnight. He does continue to have a splenic collection on imaging although this is much smaller. PCCM did discuss with IR who felt no additional drainage needed at present time. His leukocytosis has resolved, he is afebrile, and his abdomen is benign. We have been asked to see him to consider splenectomy. I do think he is at significant risk for perioperative complications/death from this procedure especially in a debilitated/demented patient with ESRD. I did run him through NSQIP calculator (see below) and he is at >40% chance of serious complication from this. Patient's daughter is very understanding and reasonable. She DOES NOT wish to put the patient through this, which I think is a very reasonable decision. She understands there is a chance he continues to deteriorate, and she notes if this occurs, she will most likely more for palliative care at that time. Allowed time for discussion and to answer questions.    - Okay for diet from surgical perspective   - Continue IV Abx; ID following - may need to consider long duration Abx for his splenic collection/infection; ultimately defer to ID   - Monitor abdominal examination - Monitor leukocytosis; normal x48 hours - Further management per primary service; we will remain available to monitor progression        All of the above findings and recommendations were discussed with the patient, patient's family (daughter at bedside), and the medical team, and all of patient's and family's questions were  answered to their expressed satisfaction.  Face-to-face time spent with the patient and care providers was 60 minutes, including time spent counseling, educating, and coordinating care of the patient.     -- Arthea Platt, PA-C [facility] 02/03/2024, 10:37 AM M-F: 7am - 4pm

## 2024-02-03 NOTE — Consult Note (Signed)
 PHARMACY - ANTICOAGULATION CONSULT NOTE  Pharmacy Consult for heparin  initiation Indication: atrial fibrillation  Allergies[1]  Patient Measurements: Height: 6' 5 (195.6 cm) Weight: 106.5 kg (234 lb 12.6 oz) IBW/kg (Calculated) : 89.1 HEPARIN  DW (KG): 96.9  Labs: Recent Labs    02/01/24 0524 02/02/24 0510 02/03/24 0500 02/03/24 1951  HGB 8.0* 8.3* 8.2*  --   HCT 25.9* 27.0* 26.7*  --   PLT 242 225 216  --   APTT  --   --   --  >200*  HEPARINUNFRC 0.35  --   --   --   CREATININE 3.32* 3.87* 3.00*  --     Estimated Creatinine Clearance: 23.1 mL/min (A) (by C-G formula based on SCr of 3 mg/dL (H)).   Medical History: Past Medical History:  Diagnosis Date   Anxiety    Arthritis    Depression    Diabetes mellitus without complication (HCC)    End stage renal disease (HCC)    Hypertension    Pneumonia    Renal disorder     Medications:  Apixaban  2.5mg  BID - last dose 1/21 @ 2136  Assessment: 40 YOM with ESRD on HD MWF, T2DM, HTN, PAD, AF on apixaban , chronic anemia admitted for septic shock with enterococcal bacteremia, with splenic abscess. Splenic abscess has failed antibiotic therapy per medical team, and will evaluate need for surgical intervention. Pt will be converted from apixaban  to IV heparin  in anticipation for this. Hgb 8.2, PLT 216 today, 1/22  Pt was previously therapeutic x2 at 1650 units/hr during this inpatient stay  0122 1951 aPTT > 200s, suprathera; 1650 un/hr  Goal of Therapy:  aPTT 66-102 seconds Monitor platelets by anticoagulation protocol: Yes   Plan:  --Hold heparin  infusion x 1 hour --Re-start heparin  infusion at 1250 units/hr --Re-check aPTT 8 hours from re-start. Follow aPTT until correlates with heparin  level --Daily CBC per protocol while on IV heparin    Marolyn KATHEE Mare 02/03/2024,9:34 PM    [1] No Known Allergies

## 2024-02-03 NOTE — Consult Note (Signed)
 PHARMACY CONSULT NOTE - ELECTROLYTES  Pharmacy Consult for Electrolyte Monitoring and Replacement   Recent Labs: Height: 6' 5 (195.6 cm) Weight: 106.5 kg (234 lb 12.6 oz) IBW/kg (Calculated) : 89.1 Estimated Creatinine Clearance: 23.1 mL/min (A) (by C-G formula based on SCr of 3 mg/dL (H)). Potassium (mmol/L)  Date Value  02/03/2024 4.1  05/06/2012 4.1   Magnesium  (mg/dL)  Date Value  98/77/7973 2.2   Calcium  (mg/dL)  Date Value  98/77/7973 8.3 (L)   Calcium , Total (mg/dL)  Date Value  95/74/7985 9.5   Albumin  (g/dL)  Date Value  98/80/7973 3.1 (L)   Phosphorus (mg/dL)  Date Value  98/77/7973 3.5   Sodium (mmol/L)  Date Value  02/03/2024 131 (L)  05/06/2012 139    Assessment  Jeffery Joseph is a 84 y.o. male presenting with septic shock and splenic abscess. PMH significant for ESRD on HD MWF, T2DM, HTN, PAD, Afib, anemia. Pharmacy has been consulted to monitor and replace electrolytes.  Diet: medium calorie, low sodium diet + feeding supplements MIVF: N/A Pertinent medications: none  Goal of Therapy: Electrolytes WNL  Plan:  No repletion indicated at this time Check BMP, Mg, Phos with AM labs  Thank you for allowing pharmacy to be a part of this patient's care.  Belvie Macintosh, PharmD Candidate 02/03/2024 7:48 AM

## 2024-02-03 NOTE — Progress Notes (Addendum)
 " [facility]  PROGRESS NOTE   Subjective:   84 year old male with end-stage renal disease, anxiety, depression, type 2 diabetes originally admitted on 01/22/2023 for complaints of dyspnea, chest tightness. Patient's daughter is in the room with him, Ms Dava.  She reports that he is complaining of burning inside his chest.  CT of the abdomen and pelvis with contrast shows splenic abscess is persistent and measures 9.3 cm.  Previously it was 11.5 cm.  Appetite remains poor.  No acute shortness of breath.    Objective:  Vital signs: Blood pressure (!) 109/44, pulse (!) 101, temperature (!) 97.4 F (36.3 C), temperature source Oral, resp. rate 14, height 6' 5 (1.956 m), weight 106.5 kg, SpO2 95%.  Intake/Output Summary (Last 24 hours) at 02/03/2024 0911 Last data filed at 02/03/2024 0600 Gross per 24 hour  Intake 400.01 ml  Output 900 ml  Net -499.99 ml   Filed Weights   02/02/24 1500 02/02/24 1916 02/03/24 0500  Weight: 107.4 kg 106.8 kg 106.5 kg     Physical Exam: General:  No acute distress  Head:  Normocephalic, atraumatic. Moist oral mucosal membranes  Eyes:  Anicteric  Lungs:   Clear to auscultation, normal effort  Heart: Tachycardic, regular  Abdomen:   Soft, nontender,   Extremities: Trace peripheral edema.  Neurologic:  Awake, alert, following commands  Skin:  No lesions  Access: Left upper arm AV fistula.    Basic Metabolic Panel: Recent Labs  Lab 01/28/24 0425 01/29/24 0441 01/30/24 0420 01/31/24 0823 02/01/24 0524 02/02/24 0510 02/03/24 0500  NA 130* 132* 129* 128* 132* 132* 131*  K 4.0 4.2 4.1 4.4 4.0 3.9 4.1  CL 93* 95* 93* 92* 95* 95* 93*  CO2 26 25 25 24 26 25 25   GLUCOSE 146* 164* 205* 189* 174* 112* 116*  BUN 30* 26* 39* 56* 46* 58* 39*  CREATININE 3.53* 2.98* 3.59* 4.08* 3.32* 3.87* 3.00*  CALCIUM  8.7* 8.7* 9.2 9.0 8.7* 8.6* 8.3*  MG 2.2 2.2  --   --  2.3 2.4 2.2  PHOS 3.8  --   --  4.7*  --  3.8 3.5   GFR: Estimated Creatinine  Clearance: 23.1 mL/min (A) (by C-G formula based on SCr of 3 mg/dL (H)).  Liver Function Tests: Recent Labs  Lab 01/31/24 0823  ALBUMIN  3.1*   No results for input(s): LIPASE, AMYLASE in the last 168 hours. No results for input(s): AMMONIA in the last 168 hours.  CBC: Recent Labs  Lab 01/30/24 0900 01/31/24 0549 02/01/24 0524 02/02/24 0510 02/03/24 0500  WBC 12.9* 10.7* 9.9 9.9 7.6  HGB 8.3* 8.3* 8.0* 8.3* 8.2*  HCT 26.4* 26.6* 25.9* 27.0* 26.7*  MCV 79.3* 79.2* 80.4 80.6 82.2  PLT 248 255 242 225 216     HbA1C: Hgb A1c MFr Bld  Date/Time Value Ref Range Status  01/23/2024 04:30 AM 5.9 (H) 4.8 - 5.6 % Final    Comment:    (NOTE) Diagnosis of Diabetes The following HbA1c ranges recommended by the American Diabetes Association (ADA) may be used as an aid in the diagnosis of diabetes mellitus.  Hemoglobin             Suggested A1C NGSP%              Diagnosis  <5.7                   Non Diabetic  5.7-6.4  Pre-Diabetic  >6.4                   Diabetic  <7.0                   Glycemic control for                       adults with diabetes.    01/23/2024 03:23 AM 5.7 (H) 4.8 - 5.6 % Final    Comment:    (NOTE) Diagnosis of Diabetes The following HbA1c ranges recommended by the American Diabetes Association (ADA) may be used as an aid in the diagnosis of diabetes mellitus.  Hemoglobin             Suggested A1C NGSP%              Diagnosis  <5.7                   Non Diabetic  5.7-6.4                Pre-Diabetic  >6.4                   Diabetic  <7.0                   Glycemic control for                       adults with diabetes.      Urinalysis: No results for input(s): COLORURINE, LABSPEC, PHURINE, GLUCOSEU, HGBUR, BILIRUBINUR, KETONESUR, PROTEINUR, UROBILINOGEN, NITRITE, LEUKOCYTESUR in the last 72 hours.  Invalid input(s): APPERANCEUR    Imaging: CT ABDOMEN PELVIS W CONTRAST Result Date:  02/03/2024 EXAM: CT ABDOMEN AND PELVIS WITH CONTRAST 02/03/2024 02:04:21 AM TECHNIQUE: CT of the abdomen and pelvis was performed with the administration of 100 mL of iohexol  (OMNIPAQUE ) 300 MG/ML solution. Multiplanar reformatted images are provided for review. Automated exposure control, iterative reconstruction, and/or weight-based adjustment of the mA/kV was utilized to reduce the radiation dose to as low as reasonably achievable. COMPARISON: CT-guided splenic collection pigtail catheter placement images from 01/26/2024, CTA abdomen and pelvis 01/25/2024, CT abdomen and pelvis without contrast 09/07/2023. CLINICAL HISTORY: Intra-abdominal abscess. FINDINGS: LOWER CHEST: Diffuse bronchial thickening in the lung bases. Infrahilar opacities in both lower lobes, which could be due to atelectasis or pneumonia. Mild features of interstitial edema in both lung bases. Calcification and thickening of the aortic valve leaflets, with mild cardiomegaly and 3-vessel coronary calcifications. Bilateral layering minimal pleural effusions, both increased. No pericardial effusion. There is discoid subareolar gynecomastia in both breasts. LIVER: Increased intrahepatic periportal edema, which is usually due to fluid overload or congestion. No liver masses. GALLBLADDER AND BILE DUCTS: Multiple tiny layering stones in the gallbladder without wall thickening. No biliary ductal dilatation. SPLEEN: An intraparenchymal fluid collection is again noted within the spleen. There is trace air in the fluid. The pigtail catheter previously in the collection has been removed (since placement on 01/26/2024). The collection today measures 9.3 cm in length, and 2.6 x 2.4 cm transverse and AP on series 2, axial 23. The collection previously measured 11.5 x 4 x 4.1 cm, respectively. There is a fluid density wedge shaped area in the lateral aspect of the spleen measuring 2.1 cm (previously 2.7 cm), probably either an infarct or subcapsular bleed,  decreased in size. PANCREAS: No acute abnormality. ADRENAL GLANDS: No adrenal mass. 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 A similar appearance was seen on prior studies including back to 2017. Consider MRI to exclude solid lesions. No urinary stone or obstruction noted. The bladder is thickened but also partially contracted, with impression by the enlarged prostate into the bladder base. GI AND BOWEL: Stomach demonstrates no acute abnormality. There is no bowel obstruction or inflammation. Moderate fecal stasis is seen as before. PERITONEUM AND RETROPERITONEUM: Small volume of pelvic ascites. Mild mesenteric congestive changes are noted. No free air. No free hemorrhage or localizing inflammatory process. VASCULATURE: Aorta is normal in caliber. Aortic and branch vessel atherosclerosis chronically noted. LYMPH NODES: No lymphadenopathy. REPRODUCTIVE ORGANS: The prostate is 5.5 cm, causing compression into the bladder base. BONES AND SOFT TISSUES: Degenerative change of the lumbar spine, grade 1 degenerative anterolisthesis L4-L5 and moderate L4-L5 acquired spinal stenosis. Ankylosis of both SI joints and bilateral hip arthrosis are again shown. No acute or other significant osseous findings. Increased body wall edema in the flanks and thighs. Small umbilical fat hernia. IMPRESSION: 1. Intraparenchymal fluid collection within the spleen with trace air, measuring 9.3 x 2.6 x 2.4 cm, larger previously, status post pigtail catheter removal. Persisting abscess not excluded. 2. Wedge-shaped fluid density focus in the lateral spleen, possibly infarct or subcapsular bleed, decreased in size to 2.1 cm. 3. Extensive polycystic kidney disease, stable appearance, and consider MRI to exclude solid lesions. 4. Mild mesenteric congestive changes with increased body wall edema in the flanks and thighs, with small volume of pelvic  ascites. 5. Cardiomegaly with small pleural effusions, and opacities in the lower lobes which could be atelectasis or pneumonia. 6. Enlarged prostate with bladder mass effect, with thickening in the bladder consistent with underdistention or hypertrophy. Underlying cystitis not strictly excluded. Electronically signed by: Francis Quam MD 02/03/2024 02:41 AM EST RP Workstation: [id number]      Medications:    ampicillin  (OMNIPEN) IV Stopped (02/02/24 2336)   cefTRIAXone  (ROCEPHIN )  IV Stopped (02/02/24 2211)   norepinephrine  (LEVOPHED ) Adult infusion Stopped (02/02/24 0501)    amiodarone   100 mg Oral Daily   apixaban   2.5 mg Oral BID   Chlorhexidine  Gluconate Cloth  6 each Topical Daily   cholecalciferol   5,000 Units Oral Daily   diclofenac  Sodium  2 g Topical QID   donepezil   10 mg Oral QHS   epoetin  alfa-epbx (RETACRIT ) injection  4,000 Units Intravenous Q M,W,F-1800   feeding supplement  237 mL Oral BID BM   finasteride   5 mg Oral Daily   insulin  aspart  0-15 Units Subcutaneous TID WC   insulin  aspart  0-5 Units Subcutaneous QHS   lactulose   30 g Oral Daily   midodrine   15 mg Oral TID WC   multivitamin  1 tablet Oral QHS   pantoprazole   40 mg Oral Daily   senna-docusate  2 tablet Oral q AM    Assessment/ Plan:     84 year old male with history of hypertension, coronary artery disease, atrial fibrillation, diabetes, anemia, anxiety/depression, BPH and end-stage renal disease on hemodialysis Monday Wednesday Friday schedule now admitted with history of chest tightness and shortness of breath.   #1: ESRD: Continue dialysis schedule Monday Wednesday and Friday.  Next hemodialysis planned for Friday.   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  Currently ordered Retacrit  4000 units IV MWF.   #3: Secondary parathyroidism: Will monitor calcium , phosphorus and PTH.  Phosphorus is in the  normal range.  Appetite is poor.   #4:  Sepsis: Patient has Enterococcus bacteremia.  Patient also has splenic abscess.  Minimal drainage therefore drain was removed by interventional radiology.  Presently on ampicillin  and Rocephin .  Outpatient regimen may need to be vancomycin  and oral quinolone to avoid another IV line for ampicillin  and ceftriaxone .  Once finalized, this will be conveyed to the outpatient dialysis center. CT abdomen and pelvis with contrast 02/03/2024 shows intraparenchymal fluid collection within the spleen measuring 9.3 cm x 2.6 cm x 2.4 cm.  Extensive polycystic kidney disease is also noted.   #5: Hypotension: Managed with midodrine .     Labs and medications reviewed. Will continue to follow along with you.   LOS: 11 Saralee Stank, MD [facility] 1/22/20269:11 AM  "

## 2024-02-03 NOTE — TOC Progression Note (Signed)
 Transition of Care [facility]) - Progression Note    Patient Details  Name: Jeffery Joseph MRN: [id number] Date of Birth: 1940/02/21  [facility] CM/SW Contact  Lauraine JAYSON Carpen, LCSW Phone Number: 02/03/2024, 3:47 PM  Clinical Narrative:   Per PT recommendations, family plans to take patient home with home health at discharge. CSW sent out referral to determine agency options.  Expected Discharge Plan and Services                                               Social Drivers of Health (SDOH) Interventions SDOH Screenings   Food Insecurity: Patient Unable To Answer (01/24/2024)  Housing: Unknown (01/24/2024)  Transportation Needs: Patient Unable To Answer (01/24/2024)  Utilities: Patient Unable To Answer (01/24/2024)  Financial Resource Strain: Low Risk  (12/01/2023)   Received from [facility]  Social Connections: Unknown (01/24/2024)  Tobacco Use: Low Risk (01/30/2024)  Recent Concern: Tobacco Use - Medium Risk (12/01/2023)   [facility]    Readmission Risk Interventions    01/24/2024   12:12 PM 07/19/2023   11:05 AM  Readmission Risk Prevention Plan  Transportation Screening Complete Complete  PCP or Specialist Appt within 3-5 Days Complete Complete  HRI or Home Care Consult Complete   Social Work Consult for Recovery Care Planning/Counseling Complete Complete  Palliative Care Screening Not Applicable Not Applicable  Medication Review [employer]) Complete Complete

## 2024-02-03 NOTE — Consult Note (Signed)
 PHARMACY - ANTICOAGULATION CONSULT NOTE  Pharmacy Consult for heparin  initiation Indication: atrial fibrillation  Allergies[1]  Patient Measurements: Height: 6' 5 (195.6 cm) Weight: 106.5 kg (234 lb 12.6 oz) IBW/kg (Calculated) : 89.1 HEPARIN  DW (KG): 96.9  Vital Signs: Temp: 97.4 F (36.3 C) (01/22 0838) Temp Source: Oral (01/22 0838) BP: 106/66 (01/22 0945) Pulse Rate: 101 (01/22 0845)  Labs: Recent Labs    01/31/24 1618 02/01/24 0524 02/01/24 0524 02/02/24 0510 02/03/24 0500  HGB  --  8.0*   < > 8.3* 8.2*  HCT  --  25.9*  --  27.0* 26.7*  PLT  --  242  --  225 216  HEPARINUNFRC 0.30 0.35  --   --   --   CREATININE  --  3.32*  --  3.87* 3.00*   < > = values in this interval not displayed.    Estimated Creatinine Clearance: 23.1 mL/min (A) (by C-G formula based on SCr of 3 mg/dL (H)).   Medical History: Past Medical History:  Diagnosis Date   Anxiety    Arthritis    Depression    Diabetes mellitus without complication (HCC)    End stage renal disease (HCC)    Hypertension    Pneumonia    Renal disorder     Medications:  Apixaban  2.5mg  BID - last dose 1/21 @ 2136  Assessment: 89 YOM with ESRD on HD MWF, T2DM, HTN, PAD, AF on apixaban , chronic anemia admitted for septic shock with enterococcal bacteremia, with splenic abscess. Splenic abscess has failed antibiotic therapy per medical team, and will evaluate need for surgical intervention. Pt will be converted from apixaban  to IV heparin  in anticipation for this. Hgb 8.2, PLT 216 today, 1/22  Pt was previously therapeutic x2 at 1650 units/hr during this inpatient stay  Goal of Therapy:  aPTT 66-102 seconds Monitor platelets by anticoagulation protocol: Yes   Plan:  No bolus Start heparin  infusion at 1650 units/hr Evaluate APTT 8 hours from initiation - due to apixaban  use anti-Xa levels not expected to correlate Monitor CBC daily while on heparin   Belvie Macintosh, PharmD Candidate 02/03/2024,10:19  AM      [1] No Known Allergies

## 2024-02-03 NOTE — Consult Note (Signed)
 " Consultation Note Date: 02/03/2024   Patient Name: Jeffery Joseph  DOB: [birth year]  MRN: [id number]  Age / Sex: 84 y.o., male   PCP: Epifanio Alm SQUIBB, MD Referring Physician: Parris Manna, MD  Reason for Consultation: Establishing goals of care     Chief Complaint/History of Present Illness:  Jeffery Joseph is an 84 year old male with end-stage renal disease, anxiety, depression, type 2 diabetes admitted with complaints of dyspnea and chest tightness.  CT during admission showed splenic abscess and he is status post drain from IR that was pulled on 1/19.  He was found to have bacteremia and ID is following.  Repeat CT scan after drain was pulled showed continued splenic involvement, however, less than before no plans for another drain.  Surgery has met with family and daughter has indicated no plan for surgical intervention as she understands high risk associated with this.  I met today with Jeffery Joseph and his daughter, Dava.  I introduced palliative care as specialized medical care for people living with serious illness. It focuses on providing relief from the symptoms and stress of a serious illness. The goal is to improve quality of life for both the patient and the family.  His daughter reports she is familiar with palliative care and he is actually followed by palliative care team as an outpatient.  States appreciation for me checking in on them today.  We discussed that Jeffery Joseph is a very independent man.  He has been married for over 60 years and has 4 daughters (1 son has passed).  He worked on a farm, at [employer], and at a recycling center.  He very much enjoyed totally around his workshop, but he is no longer able to do this due to risk of fall.  Over the summer he enjoyed sitting outside on the front porch but she reports his functional status is changed more since that time.  We discussed a little about changes in his cognition and functional status she has noted over the past  year.  We discussed her understanding of his clinical course and she reviewed multiple comorbidities including end-stage renal disease, bacteremia, splenic collection and discussion with surgery regarding potential intervention.  She reports that family has discussed and they are not interested in surgical intervention due to high risk associated with it.  We talked about plan for continuation of current care with antibiotics and seeing how his body continues to respond.  She is clear that family would not want to escalate care to overly aggressive interventions.  Limit of care is DNR/DNI.  We discussed hope that he would continue to improve, but also talked about concern that he may continue to have further decline.  She reports that they are open to consideration for refocusing care on more of a comfort approach if he continues to decline despite medical interventions.  Family is open to further palliative care follow-up tomorrow to continue conversation based upon his clinical course.  Primary Diagnoses  Present on Admission:  Chest pain   Palliative Review of Systems: Reports having continued nausea.  Denies pain or shortness of breath.  Past Medical History:  Diagnosis Date   Anxiety    Arthritis    Depression    Diabetes mellitus without complication (HCC)    End stage renal disease (HCC)    Hypertension    Pneumonia    Renal disorder    Social History   Socioeconomic History   Marital status: Married  Spouse name: ,Loraine   Number of children: Not on file   Years of education: Not on file   Highest education level: Not on file  Occupational History   Not on file  Tobacco Use   Smoking status: Never   Smokeless tobacco: Never  Vaping Use   Vaping status: Never Used  Substance and Sexual Activity   Alcohol  use: No   Drug use: Not Currently   Sexual activity: Not Currently  Other Topics Concern   Not on file  Social History Narrative   Not on file   Social  Drivers of Health   Tobacco Use: Low Risk (01/30/2024)   Patient History    Smoking Tobacco Use: Never    Smokeless Tobacco Use: Never    Passive Exposure: Not on file  Recent Concern: Tobacco Use - Medium Risk (12/01/2023)   Received from [facility]   Patient History    Passive Exposure: Not on file    Smokeless Tobacco Use: Never    Smoking Tobacco Use: Former  Physicist, Medical Strain: Low Risk  (12/01/2023)   Received from [facility]   Overall Financial Resource Strain (CARDIA)    Difficulty of Paying Living Expenses: Not hard at all  Food Insecurity: Patient Unable To Answer (01/24/2024)   Epic    Worried About Programme Researcher, Broadcasting/film/video in the Last Year: Patient unable to answer    Ran Out of Food in the Last Year: Patient unable to answer  Transportation Needs: Patient Unable To Answer (01/24/2024)   Epic    Lack of Transportation (Medical): Patient unable to answer    Lack of Transportation (Non-Medical): Patient unable to answer  Physical Activity: Not on file  Stress: Not on file  Social Connections: Unknown (01/24/2024)   Social Connection and Isolation Panel    Frequency of Communication with Friends and Family: Patient unable to answer    Frequency of Social Gatherings with Friends and Family: Patient unable to answer    Attends Religious Services: Patient unable to answer    Active Member of Clubs or Organizations: Not on file    Attends Banker Meetings: Patient unable to answer    Marital Status: Patient unable to answer  Depression (PHQ2-9): Not on file  Alcohol  Screen: Not on file  Housing: Unknown (01/24/2024)   Epic    Unable to Pay for Housing in the Last Year: Patient unable to answer    Number of Times Moved in the Last Year: 0    Homeless in the Last Year: Patient unable to answer  Utilities: Patient Unable To Answer (01/24/2024)   Epic    Threatened with loss of utilities: Patient unable to answer  Health  Literacy: Not on file   History reviewed. No pertinent family history. Scheduled Meds:  amiodarone   100 mg Oral Daily   Chlorhexidine  Gluconate Cloth  6 each Topical Daily   cholecalciferol   5,000 Units Oral Daily   diclofenac  Sodium  2 g Topical QID   donepezil   10 mg Oral QHS   epoetin  alfa-epbx (RETACRIT ) injection  4,000 Units Intravenous Q M,W,F-1800   feeding supplement  237 mL Oral BID BM   finasteride   5 mg Oral Daily   insulin  aspart  0-15 Units Subcutaneous TID WC   insulin  aspart  0-5 Units Subcutaneous QHS   lactulose   30 g Oral Daily   midodrine   15 mg Oral TID WC   multivitamin  1 tablet Oral QHS  pantoprazole   40 mg Oral Daily   senna-docusate  2 tablet Oral q AM   Continuous Infusions:  ampicillin  (OMNIPEN) IV Stopped (02/03/24 1104)   cefTRIAXone  (ROCEPHIN )  IV Stopped (02/03/24 1200)   heparin  1,650 Units/hr (02/03/24 1600)   PRN Meds:.acetaminophen  **OR** acetaminophen , ALPRAZolam , bisacodyl , calcium  carbonate, fentaNYL  (SUBLIMAZE ) injection, heparin , lidocaine  (PF), lidocaine -prilocaine , midazolam  PF, nitroGLYCERIN , ondansetron  **OR** ondansetron  (ZOFRAN ) IV, mouth rinse, pentafluoroprop-tetrafluoroeth, traZODone  Allergies[1] CBC:    Component Value Date/Time   WBC 7.6 02/03/2024 0500   HGB 8.2 (L) 02/03/2024 0500   HGB 11.5 (L) 05/06/2012 0843   HCT 26.7 (L) 02/03/2024 0500   HCT 35.5 (L) 05/06/2012 0843   PLT 216 02/03/2024 0500   PLT 146 (L) 05/06/2012 0843   MCV 82.2 02/03/2024 0500   MCV 83 05/06/2012 0843   NEUTROABS 8.8 (H) 01/25/2024 1309   NEUTROABS 4.3 05/06/2012 0843   LYMPHSABS 1.4 01/25/2024 1309   LYMPHSABS 0.8 (L) 05/06/2012 0843   MONOABS 0.9 01/25/2024 1309   MONOABS 0.5 05/06/2012 0843   EOSABS 0.1 01/25/2024 1309   EOSABS 0.2 05/06/2012 0843   BASOSABS 0.1 01/25/2024 1309   BASOSABS 0.1 05/06/2012 0843   Comprehensive Metabolic Panel:    Component Value Date/Time   NA 131 (L) 02/03/2024 0500   NA 139 05/06/2012 0843   K  4.1 02/03/2024 0500   K 4.1 05/06/2012 0843   CL 93 (L) 02/03/2024 0500   CL 106 05/06/2012 0843   CO2 25 02/03/2024 0500   CO2 26 05/06/2012 0843   BUN 39 (H) 02/03/2024 0500   BUN 33 (H) 05/06/2012 0843   CREATININE 3.00 (H) 02/03/2024 0500   CREATININE 2.09 (H) 05/06/2012 0843   GLUCOSE 116 (H) 02/03/2024 0500   GLUCOSE 112 (H) 05/06/2012 0843   CALCIUM  8.3 (L) 02/03/2024 0500   CALCIUM  9.5 05/06/2012 0843   AST 22 01/25/2024 0330   ALT 33 01/25/2024 0330   ALKPHOS 64 01/25/2024 0330   BILITOT 0.7 01/25/2024 0330   PROT 6.5 01/25/2024 0330   ALBUMIN  3.1 (L) 01/31/2024 0823    Physical Exam: Vital Signs: BP 123/86   Pulse (!) 104   Temp (!) 97.5 F (36.4 C) (Oral)   Resp 20   Ht 6' 5 (1.956 m)   Wt 106.5 kg   SpO2 94%   BMI 27.84 kg/m  SpO2: SpO2: 94 % O2 Device: O2 Device: Room Air O2 Flow Rate: O2 Flow Rate (L/min): 2 L/min Intake/output summary:  Intake/Output Summary (Last 24 hours) at 02/03/2024 1635 Last data filed at 02/03/2024 1600 Gross per 24 hour  Intake 911.44 ml  Output 1020 ml  Net -108.56 ml   LBM: Last BM Date : 02/03/24 Baseline Weight: Weight: 63.5 kg Most recent weight: Weight: 106.5 kg  General: NAD, alert Eyes: conjunctiva clear, anicteric sclera HENT: normocephalic, atraumatic, moist mucous membranes Cardiovascular: regular Respiratory: no increased work of breathing noted, not in respiratory distress Abdomen: not distended Extremities: + edema Skin: no rashes or lesions on visible skin Neuro: Alert, following commands easily          Palliative Performance Scale: 30              Additional Data Reviewed: Recent Labs    02/02/24 0510 02/03/24 0500  WBC 9.9 7.6  HGB 8.3* 8.2*  PLT 225 216  NA 132* 131*  BUN 58* 39*  CREATININE 3.87* 3.00*    Imaging: CT ABDOMEN PELVIS W CONTRAST EXAM: CT ABDOMEN AND PELVIS WITH CONTRAST 02/03/2024  02:04:21 AM  TECHNIQUE: CT of the abdomen and pelvis was performed with the  administration of 100 mL of iohexol  (OMNIPAQUE ) 300 MG/ML solution. Multiplanar reformatted images are provided for review. Automated exposure control, iterative reconstruction, and/or weight-based adjustment of the mA/kV was utilized to reduce the radiation dose to as low as reasonably achievable.  COMPARISON: CT-guided splenic collection pigtail catheter placement images from 01/26/2024, CTA abdomen and pelvis 01/25/2024, CT abdomen and pelvis without contrast 09/07/2023.  CLINICAL HISTORY: Intra-abdominal abscess.  FINDINGS:  LOWER CHEST: Diffuse bronchial thickening in the lung bases. Infrahilar opacities in both lower lobes, which could be due to atelectasis or pneumonia. Mild features of interstitial edema in both lung bases.  Calcification and thickening of the aortic valve leaflets, with mild cardiomegaly and 3-vessel coronary calcifications. Bilateral layering minimal pleural effusions, both increased. No pericardial effusion.  There is discoid subareolar gynecomastia in both breasts.  LIVER: Increased intrahepatic periportal edema, which is usually due to fluid overload or congestion. No liver masses.  GALLBLADDER AND BILE DUCTS: Multiple tiny layering stones in the gallbladder without wall thickening. No biliary ductal dilatation.  SPLEEN: An intraparenchymal fluid collection is again noted within the spleen. There is trace air in the fluid. The pigtail catheter previously in the collection has been removed (since placement on 01/26/2024).  The collection today measures 9.3 cm in length, and 2.6 x 2.4 cm transverse and AP on series 2, axial 23. The collection previously measured 11.5 x 4 x 4.1 cm, respectively.  There is a fluid density wedge shaped area in the lateral aspect of the spleen measuring 2.1 cm (previously 2.7 cm), probably either an infarct or subcapsular bleed, decreased in size.  PANCREAS: No acute abnormality.  ADRENAL GLANDS: No adrenal  mass.  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  A similar appearance was seen on prior studies including back to 2017. Consider MRI to exclude solid lesions.  No urinary stone or obstruction noted. The bladder is thickened but also partially contracted, with impression by the enlarged prostate into the bladder base.  GI AND BOWEL: Stomach demonstrates no acute abnormality. There is no bowel obstruction or inflammation. Moderate fecal stasis is seen as before.  PERITONEUM AND RETROPERITONEUM: Small volume of pelvic ascites. Mild mesenteric congestive changes are noted. No free air. No free hemorrhage or localizing inflammatory process.  VASCULATURE: Aorta is normal in caliber. Aortic and branch vessel atherosclerosis chronically noted.  LYMPH NODES: No lymphadenopathy.  REPRODUCTIVE ORGANS: The prostate is 5.5 cm, causing compression into the bladder base.  BONES AND SOFT TISSUES: Degenerative change of the lumbar spine, grade 1 degenerative anterolisthesis L4-L5 and moderate L4-L5 acquired spinal stenosis. Ankylosis of both SI joints and bilateral hip arthrosis are again shown.  No acute or other significant osseous findings. Increased body wall edema in the flanks and thighs. Small umbilical fat hernia.  IMPRESSION: 1. Intraparenchymal fluid collection within the spleen with trace air, measuring 9.3 x 2.6 x 2.4 cm, larger previously, status post pigtail catheter removal. Persisting abscess not excluded. 2. Wedge-shaped fluid density focus in the lateral spleen, possibly infarct or subcapsular bleed, decreased in size to 2.1 cm. 3. Extensive polycystic kidney disease, stable appearance, and consider MRI to exclude solid lesions. 4. Mild mesenteric congestive changes with increased body wall edema in the flanks and thighs, with small volume of pelvic ascites. 5.  Cardiomegaly with small pleural effusions, and opacities in the lower lobes which could  be atelectasis or pneumonia. 6. Enlarged prostate with bladder mass effect, with thickening in the bladder consistent with underdistention or hypertrophy. Underlying cystitis not strictly excluded.  Electronically signed by: Francis Quam MD 02/03/2024 02:41 AM EST RP Workstation: [id number]    I personally reviewed recent imaging.   Palliative Care Assessment and Plan Summary of Established Goals of Care and Medical Treatment Preferences    # Complex medical decision making/goals of care  - DNR/DNI  - Patient's daughter, Dava, is HCPOA.  Met with her at the bedside today.  - Discussed plan for continuation of current care with antibiotics.  Family (patient's daughter, Dava) expressed that they are not interested in surgical intervention.  Discussed if he continues to decline despite antibiotic support, would continue with discussion about potential transition to hospice with comfort care focus.  -  Code Status: Limited: Do not attempt resuscitation (DNR) -DNR-LIMITED -Do Not Intubate/DNI   Prognosis: Unable to determine  # Discharge Planning:  To Be Determined  Thank you for allowing the palliative care team to participate in the care [name].  Amaryllis Meissner, MD Palliative Care Provider PMT # [phone]  If patient remains symptomatic despite maximum doses, please call PMT at [phone] between 0700 and 1900. Outside of these hours, please call attending, as PMT does not have night coverage.   I personally spent a total of 60 minutes in the care of the patient today including preparing to see the patient, getting/reviewing separately obtained history, performing a medically appropriate exam/evaluation, counseling and educating, referring and communicating with other health care professionals, and documenting clinical information in the EHR.     [1] No Known Allergies  "

## 2024-02-03 NOTE — Plan of Care (Signed)
" °  Problem: Education: Goal: Ability to describe self-care measures that may prevent or decrease complications (Diabetes Survival Skills Education) will improve Outcome: Progressing   Problem: Metabolic: Goal: Ability to maintain appropriate glucose levels will improve Outcome: Progressing   Problem: Nutritional: Goal: Maintenance of adequate nutrition will improve Outcome: Progressing   Problem: Tissue Perfusion: Goal: Adequacy of tissue perfusion will improve Outcome: Progressing   Problem: Clinical Measurements: Goal: Diagnostic test results will improve Outcome: Not Progressing   Problem: Activity: Goal: Risk for activity intolerance will decrease Outcome: Not Progressing   Problem: Pain Managment: Goal: General experience of comfort will improve and/or be controlled Outcome: Not Progressing   "

## 2024-02-03 NOTE — Progress Notes (Signed)
 "  Date of Admission:  01/22/2024     ID: Jeffery Joseph is a 84 y.o. male Principal Problem:   Chest pain Active Problems:   Chronic atrial fibrillation (HCC)   Type 2 diabetes mellitus with chronic kidney disease, without long-term current use of insulin  (HCC)   BPH (benign prostatic hyperplasia)   End-stage renal disease on hemodialysis (HCC)   Influenza   Pressure injury of skin   Bacteremia   Splenic abscess   Bacteremia due to Enterococcus   Daughters at bed side Subjective:  Feeling very tired Abdominal pain  Medications:   diclofenac  Sodium  2 g Topical QID    Objective: Vital signs in last 24 hours: Patient Vitals for the past 24 hrs:  BP Temp Temp src Pulse Resp SpO2 Weight  02/03/24 1930 (!) 110/92 -- -- (!) 105 18 91 % --  02/03/24 1900 (!) 140/72 -- -- -- (!) 25 -- --  02/03/24 1830 (!) 117/35 -- -- -- (!) 24 -- --  02/03/24 1800 (!) 148/38 -- -- -- 13 -- --  02/03/24 1730 (!) 136/37 -- -- -- (!) 21 -- --  02/03/24 1700 (!) 143/43 -- -- -- 13 -- --  02/03/24 1630 123/86 -- -- -- 20 -- --  02/03/24 1615 -- (!) 97.5 F (36.4 C) Oral -- -- -- --  02/03/24 1600 (!) 140/53 -- -- -- 15 -- --  02/03/24 1530 (!) 143/47 -- -- -- (!) 9 -- --  02/03/24 1500 134/67 -- -- -- 16 -- --  02/03/24 1430 130/71 -- -- -- 18 -- --  02/03/24 1400 (!) 145/55 -- -- -- (!) 0 -- --  02/03/24 1330 135/62 -- -- (!) 104 18 94 % --  02/03/24 1300 134/60 -- -- -- 16 -- --  02/03/24 1245 115/70 -- -- (!) 105 15 100 % --  02/03/24 1230 (!) 136/33 -- -- -- 16 -- --  02/03/24 1228 (!) 141/62 -- -- -- (!) 25 -- --  02/03/24 1130 (!) 109/42 -- -- -- (!) 23 -- --  02/03/24 1115 (!) 109/45 -- -- -- 17 -- --  02/03/24 1100 (!) 109/41 -- -- -- (!) 24 -- --  02/03/24 1045 (!) 101/47 -- -- -- 17 -- --  02/03/24 1030 (!) 94/45 -- -- (!) 108 (!) 22 100 % --  02/03/24 1015 (!) 87/44 -- -- (!) 105 15 100 % --  02/03/24 1000 -- -- -- (!) 108 15 100 % --  02/03/24 0945 106/66 -- -- -- 13 -- --   02/03/24 0930 (!) 101/44 -- -- -- 20 -- --  02/03/24 0915 (!) 105/45 -- -- -- 17 -- --  02/03/24 0900 (!) 109/44 -- -- -- 14 -- --  02/03/24 0845 (!) 102/51 -- -- (!) 101 17 95 % --  02/03/24 0838 -- (!) 97.4 F (36.3 C) Oral (!) 108 20 100 % --  02/03/24 0830 (!) 101/47 -- -- (!) 109 (!) 26 100 % --  02/03/24 0815 (!) 96/46 -- -- (!) 106 16 100 % --  02/03/24 0800 (!) 77/31 -- -- (!) 107 14 99 % --  02/03/24 0745 (!) 80/31 -- -- (!) 108 16 100 % --  02/03/24 0730 (!) 85/50 -- -- (!) 108 15 100 % --  02/03/24 0715 (!) 98/46 -- -- (!) 106 16 98 % --  02/03/24 0700 (!) 102/37 -- -- (!) 108 16 99 % --  02/03/24 0600 (!) 95/38 -- -- [name]  109 17 100 % --  02/03/24 0500 (!) 93/48 -- -- (!) 108 15 100 % 106.5 kg  02/03/24 0400 (!) 93/42 97.9 F (36.6 C) Oral (!) 108 18 100 % --  02/03/24 0300 (!) 93/40 -- -- -- (!) 21 -- --  02/03/24 0200 (!) 103/46 -- -- (!) 104 13 96 % --  02/03/24 0100 (!) 92/46 -- -- (!) 106 (!) 21 100 % --  02/03/24 0000 (!) 87/40 97.8 F (36.6 C) Oral (!) 108 15 100 % --  02/02/24 2315 (!) 95/47 -- -- (!) 106 (!) 25 99 % --  02/02/24 2300 (!) 92/46 -- -- (!) 108 (!) 21 100 % --  02/02/24 2245 (!) 96/46 -- -- (!) 109 17 100 % --       PHYSICAL EXAM:  General: Awake, repsonds to questions Lungs: BiLateral air entry Heart: Tachycardic  abdomen: Soft,  edema of abdominal wall and back and thighs  edema. No clubbing Skin: No rashes or lesions. Or bruising Lymph: Cervical, supraclavicular normal. Neurologic: Grossly non-focal  Lab Results    Latest Ref Rng & Units 02/03/2024    5:00 AM 02/02/2024    5:10 AM 02/01/2024    5:24 AM  CBC  WBC 4.0 - 10.5 K/uL 7.6  9.9  9.9   Hemoglobin 13.0 - 17.0 g/dL 8.2  8.3  8.0   Hematocrit 39.0 - 52.0 % 26.7  27.0  25.9   Platelets 150 - 400 K/uL 216  225  242        Latest Ref Rng & Units 02/03/2024    7:51 PM 02/03/2024    5:00 AM 02/02/2024    5:10 AM  CMP  Glucose 70 - 99 mg/dL  883  887   BUN 8 - 23 mg/dL  39  58    Creatinine 9.38 - 1.24 mg/dL  6.99  6.12   Sodium 864 - 145 mmol/L  131  132   Potassium 3.5 - 5.1 mmol/L  4.1  3.9   Chloride 98 - 111 mmol/L  93  95   CO2 22 - 32 mmol/L  25  25   Calcium  8.9 - 10.3 mg/dL  8.3  8.6   Total Protein 6.5 - 8.1 g/dL 6.6     Total Bilirubin 0.0 - 1.2 mg/dL 0.3     Alkaline Phos 38 - 126 U/L 66     AST 15 - 41 U/L 15     ALT 0 - 44 U/L 21         Microbiology: 01/24/24 BC Enterococcus 4/4 Repeat blood culture  01/27/2023- NG Splenic abscess culture Klebsiella pneumoniae and enterococcus fecalis    Assessment/Plan: 84 y.o. 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   Enterococcus bacteremia -with sepsis  source could be AV FISTULA  On ceftriaxone  and ampicillin . Will need 6 weeks of IV antibiotics 2 d echo no vegetation - cardiology wants him to  be treated as endocarditis with no TEE as he is not stable to have the procedure  Splenic abscess raises  concern for endocarditis  Splenic abscess-  IR placed drain and they removed it on 1/19 following their protocol-no imaging was repeated Pt now feels hot and nauseous Repeat  CT abdomen shows smaller splenic abscess Surgeon seen patient- no surgical intervention Discussed with IR-  if needed the abscess can be aspirated or new drain placed- will observe for now  Enterococcus and klebsiella pneumoniae in splenic abscess culture Klesiella susceptible to ceftriaxone    persistent hypotension On levo Midodrine  Stress dose steroids ( no cortisol level) Pt on tamsulosin - consider DC  ESRD on HD thru AVF   DM - management as per primary team   Afib on amiodarone    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

## 2024-02-03 NOTE — Progress Notes (Signed)
"                                                                 Brief Progress Note    Pt c/o abdominal discomfort on 01/21 CT Abd Pelvis W Contrast revealed intraparenchymal fluid collection within the spleen with trace air, measuring 9.3 x 2.6 x 2.4 cm, larger previously, status post pigtail catheter removal. Persisting abscess not excluded. Wedge-shaped fluid density focus in the lateral spleen, possibly infarct or subcapsular bleed, decreased in size to 2.1 cm. Extensive polycystic kidney disease, stable appearance, and consider MRI to exclude solid lesions. Mild mesenteric congestive changes with increased body wall edema in the flanks and thighs, with small volume of pelvic ascites. Cardiomegaly with small pleural effusions, and opacities in the lower lobes which could be atelectasis or pneumonia. Enlarged prostate with bladder mass effect, with thickening in the bladder consistent with underdistention or hypertrophy. Underlying cystitis not strictly excluded.  Plan: - IR consulted Dr. Luverne following review repeat imaging Dr. Luverne determined amount of fluid in the spleen is much smaller compared to previous imaging.  Therefore, pt does not require drain placement or procedure at this time.  Recommended continuing abx for now  Discussed CT findings and plan of care as outlined above with pt and [ts daughter Jeffery Joseph at bedside.    Jeffery Joseph, AGNP  Pulmonary/Critical Care Pager [phone] (please enter 7 digits) PCCM Consult Pager [phone] (please enter 7 digits)  "

## 2024-02-03 NOTE — Progress Notes (Signed)
 "  NAME:  Jeffery Joseph, MRN:  [id number], DOB:  [birth year], LOS: 11 ADMISSION DATE:  01/22/2024, CONSULTATION DATE: 01/24/2024 REFERRING MD: Dr. 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 Symptoms began several days prior to presentation. Patient was taking oseltamivir  for possible influenza. He reported dry cough and denied nausea, vomiting, abdominal pain, diarrhea, melena, or hematochezia. Last hemodialysis session was on Friday prior to admission.    Micro Data  COVID/Influenza A&B/RSV 01/11>>negative  RVP 01/11>>negative  Blood x2 01/11>>enterococcus Faecalis  MRSA PCR 01/12>>negative  Tracheal aspirate 01/13>>  Significant Hospital Events: Including procedures, antibiotic start and stop dates in addition to other pertinent events   1/11: Admitted to trh service 1/12: Became hypotensive started on levophed , critical care consulted 1/13: Pt continues to require levophed  gtt complains of his stomach not feeling good CT   Abd Pelvis pending concerning for splenic fluid collection concerning for possible abscess, Surgery and IR consulted, recommends aspiration and drain placement.  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 1/14: No acute events overnight, afebrile, remains on Levophed  (weaned to 6 mcg).  More awake and alert, consult Neurology for acute CVA on MRI yesterday.  Tentative plan for IR to perform splenic aspiration/drain today.  Cardiology tentatively planning for TEE tomorrow. 01/27/24- patient with sepsis bacteremia, for TEE today.   Had splenic drain (cultures with  GPC+) and dialysis yesterday. ID on case is on ampicillin .  Concern for AV graft fistula contamination.   01/28/24- less output from splenic drain this am, making some urine and gets dialysis.  He's on levophed  14mcg. Starting solucortef for suspected arenal insufficiency.   01/30/24- patient is on levophed  weaned from 12 to 9 this morning.  Mental status is with intermittent confusion.  Cbc with stable leukocytosis and chornic anemia, abscess culture with klebsiella , blood culture with enterococcus.  Treated with ampicillin /rocephin .  Family at bedside today, declined IV fluids post discussion of medical plan. Remains on midodrine  15tid and levophed . Poor prognosis DNR/DNI working with palliative.  01/31/24- patient reports constipation.  He is hypotensive on levophed  and is on day 4 abx with ampicillin  and rocephin  BID.  02/01/24- mental status is improved, levophed  is down to 4.  Continuing midodrine .  Splenic drain is removed. Cardiology, nephrology, ID is following. Amp/rocephin  day 5.  HD on MWF.  02/02/24- patient is better this morning, he requests left TLC to be removed. We will do that after HD. He's off pressors this am.  He has new splenic collection and IR has evaluated with no drain recommended at this time.  02/03/24- patient had surgery eval for splenectomy, not a surgical candidate.  Palliative care is following.    Objective    Blood pressure (!) 101/47, pulse (!) 109, temperature (!) 97.4 F (36.3 C), temperature source Oral, resp. rate (!) 26, height 6' 5 (1.956 m), weight 106.5 kg, SpO2 100%. CVP:  [4 mmHg-48 mmHg] 20 mmHg      Intake/Output Summary (Last 24 hours) at 02/03/2024 0839 Last data filed at 02/03/2024 0600 Gross per 24 hour  Intake 400.01 ml  Output 900 ml  Net -499.99 ml   [employer]  02/02/24 1500 02/02/24 1916 02/03/24 0500  Weight: 107.4 kg 106.8 kg 106.5 kg    Examination: General: Acute on chronically-ill appearing male, NAD on RA  HENT: Supple, no  JVD  Lungs: Diminished throughout, even, non labored  Cardiovascular: Sinus tachycardia, no m/r/g, 2+ radial/1+, trace generalized edema Abdomen: Hypoactive BS x4, obese, taut, non tender  Extremities: Normal bulk and tone, moves all extremities  Neuro: Alert and oriented x1, follows commands, PERRLA GU: Deferred   Specimen Description ABSCESS Performed at [facility], [address] [place], [place] [address]  Special Requests Normal WD Performed at [facility], [address] [place], [place] [address]  Gram Stain MODERATE WBC PRESENT, PREDOMINANTLY PMN MODERATE GRAM POSITIVE COCCI Performed at [facility], [address], [place], [place] [address]  Culture ABUNDANT KLEBSIELLA PNEUMONIAE ABUNDANT ENTEROCOCCUS FAECALIS NO ANAEROBES ISOLATED; CULTURE IN PROGRESS FOR 5 DAYS  Report Status PENDING  Organism ID, Bacteria KLEBSIELLA PNEUMONIAE    Blood Culture ID Panel (Reflexed) Order: [id number]  Status: Final result     Next appt: 04/20/2024 at 08:30 AM in Vascular Surgery ([facility] V[facility] 3)   Test Result Released: Yes (not seen)   0 Result Notes    Component Ref Range & Units (hover) 6 d ago  Enterococcus faecalis DETECTED Abnormal   Comment: CRITICAL RESULT CALLED TO, READ BACK BY AND VERIFIED WITH: MAYA CHARLENA NAPOLEON [phone] @ 2318 FH     Assessment and Plan      #Shock: Septic and hypovolemic -present on admission - due to bacteremia and spelnic abscess- on ampiccillin and rocephin  . ID on case appreciate input Dr Fayette           Lactate normal 01/29/24 , mentation with baseline dementia and acute critical illness acceptable with GCS12 -failed PCD w/ IV abx, have called surgery for splenectomy, no surgery at this time  #Acute CVA- s/p treatment , PT/OT, statin asa   #Depression  MRI Brain 01/25/24: 5 mm acute infarct at the left temporooccipital junction: Mild chronic small vessel ischemic disease. -Treatment of metabolic  derangements as outlined above -Provide supportive care -Promote normal sleep/wake cycle and family presence -Avoid sedating medications as able -Neurology consulted, appreciate input  -Continue outpatient donepezil  and prn xanax   #Elevated troponin due to demand ischemia  #Chronic atrial flutter/fibrillation   #Acute on chronic HFpEF  Hx: HTN and PAD  -Echocardiogram 01/25/24: LVEF 45 to 50%, grade 2 diastolic dysfunction, RV systolic function is low normal, RV size mildly enlarged mild to moderate mitral valve regurgitation, mild to moderate tricuspid valve regurgitation, mild aortic valve regurgitation, mild aortic valve stenosis -Continuous cardiac monitoring -Maintain MAP >65 -Cautious IV fluids -Vasopressors as needed to maintain MAP goal -Continue Midodrine   -Trend lactic acid until normalized -HS Troponin peaked at 789 -Volume removal with dialysis -Cardiology following, appreciate inpnt -Continue scheduled amiodarone  and finasteride  -Holding Anticoagulation for now given pending IR aspiration of splenic abscess and new CVA  #Previous influenza infection  -repeat cxr  #Pulmonary edema  - Supplemental O2 for dyspnea and/or hypoxia - Maintain O2 sats 92% or higher  - Prn CXR's  - Fluid removal as tolerated during HD   #ESRD on HD  #Hypokalemia  #Hyponatremia  #Hypomagnesia  - Trend BMP  - Replace electrolytes as indicated  - Strict I&O's - Nephrology consulted appreciate input: HD per recommendations   #Anemia of chronic kidney disease  - Trend CBC  - Monitor for s/sx of bleeding  - Transfuse for hgb <  7  Type II diabetes mellitus  - CBG's ac/hs - SSI  - Target CBG readings 140 to 180 - Follow hypo/hyperglycemic protocol     Labs   CBC: Recent Labs  Lab 01/30/24 0900 01/31/24 0549 02/01/24 0524 02/02/24 0510 02/03/24 0500  WBC 12.9* 10.7* 9.9 9.9 7.6  HGB 8.3* 8.3* 8.0* 8.3* 8.2*  HCT 26.4* 26.6* 25.9* 27.0* 26.7*  MCV 79.3* 79.2* 80.4 80.6 82.2   PLT 248 255 242 225 216    Basic Metabolic Panel: Recent Labs  Lab 01/28/24 0425 01/29/24 0441 01/30/24 0420 01/31/24 0823 02/01/24 0524 02/02/24 0510 02/03/24 0500  NA 130* 132* 129* 128* 132* 132* 131*  K 4.0 4.2 4.1 4.4 4.0 3.9 4.1  CL 93* 95* 93* 92* 95* 95* 93*  CO2 26 25 25 24 26 25 25   GLUCOSE 146* 164* 205* 189* 174* 112* 116*  BUN 30* 26* 39* 56* 46* 58* 39*  CREATININE 3.53* 2.98* 3.59* 4.08* 3.32* 3.87* 3.00*  CALCIUM  8.7* 8.7* 9.2 9.0 8.7* 8.6* 8.3*  MG 2.2 2.2  --   --  2.3 2.4 2.2  PHOS 3.8  --   --  4.7*  --  3.8 3.5   GFR: Estimated Creatinine Clearance: 23.1 mL/min (A) (by C-G formula based on SCr of 3 mg/dL (H)). Recent Labs  Lab 01/29/24 1148 01/30/24 0420 01/31/24 0549 02/01/24 0524 02/02/24 0510 02/02/24 1433 02/03/24 0500  WBC  --    < > 10.7* 9.9 9.9  --  7.6  LATICACIDVEN 1.0  --   --   --   --  1.7  --    < > = values in this interval not displayed.    Liver Function Tests: Recent Labs  Lab 01/31/24 0823  ALBUMIN  3.1*   No results for input(s): LIPASE, AMYLASE in the last 168 hours. No results for input(s): AMMONIA in the last 168 hours.  ABG    Component Value Date/Time   HCO3 27.2 01/23/2024 2300   TCO2 17 (L) 11/14/2021 0718   O2SAT 98.3 01/29/2024 1255     Coagulation Profile: Recent Labs  Lab 01/28/24 1400  INR 1.5*    Cardiac Enzymes: No results for input(s): CKTOTAL, CKMB, CKMBINDEX, TROPONINI in the last 168 hours.  HbA1C: Hgb A1c MFr Bld  Date/Time Value Ref Range Status  01/23/2024 04:30 AM 5.9 (H) 4.8 - 5.6 % Final    Comment:    (NOTE) Diagnosis of Diabetes The following HbA1c ranges recommended by the American Diabetes Association (ADA) may be used as an aid in the diagnosis of diabetes mellitus.  Hemoglobin             Suggested A1C NGSP%              Diagnosis  <5.7                   Non Diabetic  5.7-6.4                Pre-Diabetic  >6.4                   Diabetic  <7.0                    Glycemic control for                       adults with diabetes.    01/23/2024 03:23 AM 5.7 (H) 4.8 - 5.6 % Final  Comment:    (NOTE) Diagnosis of Diabetes The following HbA1c ranges recommended by the American Diabetes Association (ADA) may be used as an aid in the diagnosis of diabetes mellitus.  Hemoglobin             Suggested A1C NGSP%              Diagnosis  <5.7                   Non Diabetic  5.7-6.4                Pre-Diabetic  >6.4                   Diabetic  <7.0                   Glycemic control for                       adults with diabetes.      CBG: Recent Labs  Lab 02/01/24 2115 02/02/24 0728 02/02/24 1158 02/02/24 1602 02/03/24 0731  GLUCAP 132* 94 101* 114* 115*    Review of Systems: Positives in BOLD   Gen: Denies fever, chills, weight change, fatigue, night sweats HEENT: Denies blurred vision, double vision, hearing loss, tinnitus, sinus congestion, rhinorrhea, sore throat, neck stiffness, dysphagia PULM: Denies shortness of breath, cough, sputum production, hemoptysis, wheezing CV: Denies chest pain, edema, orthopnea, paroxysmal nocturnal dyspnea, palpitations GI: abdominal discomfort, nausea, vomiting, diarrhea, hematochezia, melena, constipation, change in bowel habits GU: Denies dysuria, hematuria, polyuria, oliguria, urethral discharge Endocrine: Denies hot or cold intolerance, polyuria, polyphagia or appetite change Derm: Denies rash, dry skin, scaling or peeling skin change Heme: Denies easy bruising, bleeding, bleeding gums Neuro: Denies headache, numbness, weakness, slurred speech, loss of memory or consciousness  Past Medical History:  He,  has a past medical history of Anxiety, Arthritis, Depression, Diabetes mellitus without complication (HCC), End stage renal disease (HCC), Hypertension, Pneumonia, and Renal disorder.   Surgical History:   Past Surgical History:  Procedure Laterality Date   A/V FISTULAGRAM Left  06/08/2023   Procedure: A/V Fistulagram;  Surgeon: Jama Cordella MATSU, MD;  Location: [facility];  Service: Cardiovascular;  Laterality: Left;   A/V SHUNT INTERVENTION Left 09/29/2023   Procedure: A/V SHUNT INTERVENTION;  Surgeon: Marea Selinda RAMAN, MD;  Location: [facility];  Service: Cardiovascular;  Laterality: Left;   APPLICATION OF WOUND VAC Right 11/14/2021   Procedure: APPLICATION OF WOUND VAC;  Surgeon: Jama Cordella MATSU, MD;  Location: [facility] ORS;  Service: Vascular;  Laterality: Right;   AV FISTULA PLACEMENT Left 11/14/2021   Procedure: ARTERIOVENOUS (AV) FISTULA CREATION ( BRACHIAL CEPHALIC);  Surgeon: Jama Cordella MATSU, MD;  Location: [facility] ORS;  Service: Vascular;  Laterality: Left;     Social History:   reports that he has never smoked. He has never used smokeless tobacco. He reports that he does not currently use drugs. He reports that he does not drink alcohol .   Family History:  His family history is not on file.   Allergies Allergies[1]   Home Medications  Prior to Admission medications  Medication Sig Start Date End Date Taking? Authorizing Provider  acetaminophen  (TYLENOL ) 325 MG tablet Take 650 mg by mouth every 6 (six) hours as needed.   Yes [provider]  ALPRAZolam  (XANAX ) 0.5 MG tablet Take 0.5 mg by mouth 3 (three) times daily as needed for sleep. 02/05/21  Yes [provider]  amiodarone  (PACERONE ) 200 MG tablet Take 2 tablets (400 mg total) by mouth 2 (two) times daily for 7 days, THEN 1 tablet (200 mg total) daily. 07/24/23 01/22/24 Yes Sreenath, Sudheer B, MD  apixaban  (ELIQUIS ) 2.5 MG TABS tablet Take 1 tablet (2.5 mg total) by mouth 2 (two) times daily. 07/24/23 01/23/24 Yes Sreenath, Sudheer B, MD  Cholecalciferol  125 MCG (5000 UT) TABS Take 5,000 Units by mouth daily.   Yes [provider]  clopidogrel  (PLAVIX ) 75 MG tablet Take 1 tablet (75 mg total) by mouth daily. 06/09/23  Yes Schnier, Cordella MATSU, MD  diclofenac  Sodium  (VOLTAREN ) 1 % GEL Apply 2 g topically 4 (four) times daily. 07/24/23  Yes Sreenath, Sudheer B, MD  Docusate Sodium  (DSS) 100 MG CAPS Take 100 mg by mouth at bedtime.   Yes [provider]  donepezil  (ARICEPT ) 10 MG tablet Take 10 mg by mouth at bedtime. 05/14/21  Yes [provider]  finasteride  (PROSCAR ) 5 MG tablet Take 1 tablet (5 mg total) by mouth daily. 08/12/19  Yes Stoioff, Glendia BROCKS, MD  lidocaine -prilocaine  (EMLA ) cream Apply 1 Application topically as needed (Dialysis). 08/18/23  Yes [provider]  metoprolol  succinate (TOPROL -XL) 25 MG 24 hr tablet Take 0.5 tablets (12.5 mg total) by mouth daily. 07/24/23 01/22/24 Yes Sreenath, Sudheer B, MD  tamsulosin  (FLOMAX ) 0.4 MG CAPS capsule Take 1 capsule (0.4 mg total) by mouth daily. 08/12/19  Yes Stoioff, Glendia BROCKS, MD  enalapril  (VASOTEC ) 20 MG tablet Take 20 mg by mouth 2 (two) times daily. Patient not taking: Reported on 01/20/2024 08/12/21   [provider]  furosemide  (LASIX ) 40 MG tablet Take 1 tablet (40 mg total) by mouth every other day. Patient not taking: Reported on 01/20/2024 07/25/23   Jhonny Calvin NOVAK, MD  glipiZIDE  (GLUCOTROL  XL) 5 MG 24 hr tablet Take by mouth. Patient not taking: Reported on 01/20/2024 12/01/11   [provider]  LOKELMA 10 g PACK packet Take 1 packet by mouth daily. Patient not taking: Reported on 01/20/2024 07/12/23   [provider]  sodium bicarbonate  650 MG tablet Take 650 mg by mouth 2 (two) times daily. Patient not taking: Reported on 01/20/2024 06/16/23   [provider]     Critical care provider statement:   Total critical care time: 33 minutes   Performed by: Parris MD   Critical care time was exclusive of separately billable procedures and treating other patients.   Critical care was necessary to treat or prevent imminent or life-threatening deterioration.   Critical care was time spent personally by me on the following activities: development  of treatment plan with patient and/or surrogate as well as nursing, discussions with consultants, evaluation of patient's response to treatment, examination of patient, obtaining history from patient or surrogate, ordering and performing treatments and interventions, ordering and review of laboratory studies, ordering and review of radiographic studies, pulse oximetry and re-evaluation of patient's condition.    Charonda Hefter, M.D.  Pulmonary & Critical Care Medicine                      [1] No Known Allergies  "

## 2024-02-03 NOTE — IPAL (Signed)
" °  Interdisciplinary Goals of Care Family Meeting   Date carried out: 02/03/2024  Location of the meeting: Conference room  Member's involved: Family Member or next of kin and Other: Audiological Scientist or acting medical decision maker: Daughter - Dava  Discussion: We discussed goals of care for [employer] .  A discussion regarding goals of care was had with multiple family members as well as the patient and his wife. It was discussed that given the multitude of interventions he has obtained since being hospitalized, he remains with an overall poor prognosis. His daughter, Dava, mentioned comfort care initially which prompted the conversation with the whole family and Mr. Nikolai at bedside. It was communicated that persistent medical intervention will not get him back to the baseline he wishes to be at. He reported being tired and no longer wanting dialysis or other medical interventions. He requested to be made comfortable. Family all discussed and ultimately decided that we would transition him to comfort care measures.  Code status:   Code Status: Limited: Do not attempt resuscitation (DNR) -DNR-LIMITED -Do Not Intubate/DNI    Disposition: In-patient comfort care  Time spent for the meeting: 30 minutes    Raymona Boss, PA-C  02/03/2024, 9:57 PM   "

## 2024-02-03 NOTE — Progress Notes (Signed)
 1830-Patient requested to sit up on the side of the bed.  1845-Patient requested to lay back down in bed. Stated he felt a a little short of breath but that it had gone away. I noted that patient had had a small BM, so I requested assistance with having patient stand at bedside, cleaned, and positioned back into bed.   After assisting patient back in bed, he started to complain of nausea, dizziness, and generalized feeling of not feeling well. He seemed withdrawn and presented ill-appearing to me. I administered PRN Zofran .   Lonell Moose, NP notified and requested to come to bedside. STAT troponin ordered and 12-lead EKG.  Family called and notified of above.

## 2024-02-03 NOTE — Progress Notes (Signed)
 Brief Interventional Radiology Note:  84 year old male with a history of demential, ESRD on HD, DM, A-fib on Eliquis , chronic anemia, and recent splenic drain placement followed by subsequent removal on 1/19. Patient initially had drain placed on 1/14 due to concern for splenic abscess. Drain initially had ~145mL dark/purulent fluid out followed by several days of scant serosanguinous drainage leading to the drain being pulled at the bedside.   IR has been consulted for possible replacement of the drain following repeat CT A/P due to hypotension and abdominal pain. Imaging demonstrating residual intraparenchymal fluid collection within the spleen with trace air. Case reviewed with Dr. Luverne who does not recommend repeat drain placement at this time given decreased size of fluid collection.   IR is not recommending further intervention as fluid collection is decreased in size. Patient has also been hypotensive with reports of epigastric abdominal pain and nausea daily since arrival which is grossly unchanged on exam today. Continue IV antibiotics per ID.   IR will sign off. Please feel free to reach out with any additional questions or concerns if patient status changes.   Electronically Signed: Ratasha Fabre M Osamu Olguin, PA-C 02/03/2024, 10:44 AM

## 2024-02-03 NOTE — Progress Notes (Signed)
 [facility] CLINIC CARDIOLOGY PROGRESS NOTE   Patient ID: Jeffery Joseph MRN: [id number] DOB/AGE: 01/28/1940 84 y.o.  Admit date: 01/22/2024 Referring Physician Dr. Madison Peaches Primary Physician Epifanio Alm SQUIBB, MD  Primary Cardiologist Dr. Florencio Reason for Consultation AoCHF  HPI: Jeffery Joseph is a 84 y.o. 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 Cardiology was consulted for further evaluation.   Interval History:  - Patient seen and examined this morning, resting comfortably in hospital bed.  - Off of levo. Continues to report nausea.  - Denies any chest discomfort, SOB, or palpitations. HR remains stable.  Review of systems complete and found to be negative unless listed above   Vitals:   02/03/24 0500 02/03/24 0600 02/03/24 0700 02/03/24 0730  BP: (!) 93/48 (!) 95/38 (!) 102/37 (!) 85/50  Pulse: (!) 108 (!) 109 (!) 108 (!) 108  Resp: 15 17 16 15   Temp:      TempSrc:      SpO2: 100% 100% 99% 100%  Weight: 106.5 kg     Height:         Intake/Output Summary (Last 24 hours) at 02/03/2024 9247 Last data filed at 02/03/2024 0600 Gross per 24 hour  Intake 400.01 ml  Output 900 ml  Net -499.99 ml     PHYSICAL EXAM General: Chronically ill-appearing male, well nourished, in no acute distress. HEENT: Normocephalic and atraumatic. Neck: No JVD.  Lungs: Normal respiratory effort on room air. Clear bilaterally to auscultation. No wheezes, crackles, rhonchi.  Heart: HRR, elevated rate. Normal S1 and S2 without gallops or murmurs. Radial & DP pulses 2+ bilaterally. Abdomen: Non-distended appearing.  Msk: Normal strength and tone for age. Extremities: No clubbing, cyanosis or edema.   Neuro: Alert and oriented X 3. Psych: Mood appropriate, affect congruent.    LABS: Basic  Metabolic Panel: Recent Labs    02/02/24 0510 02/03/24 0500  NA 132* 131*  K 3.9 4.1  CL 95* 93*  CO2 25 25  GLUCOSE 112* 116*  BUN 58* 39*  CREATININE 3.87* 3.00*  CALCIUM  8.6* 8.3*  MG 2.4 2.2  PHOS 3.8 3.5   Liver Function Tests: Recent Labs    01/31/24 0823  ALBUMIN  3.1*    No results for input(s): LIPASE, AMYLASE in the last 72 hours. CBC: Recent Labs    02/02/24 0510 02/03/24 0500  WBC 9.9 7.6  HGB 8.3* 8.2*  HCT 27.0* 26.7*  MCV 80.6 82.2  PLT 225 216   Cardiac Enzymes: No results for input(s): CKTOTAL, CKMB, CKMBINDEX, TROPONINIHS in the last 72 hours. BNP: No results for input(s): BNP in the last 72 hours. D-Dimer: No results for input(s): DDIMER in the last 72 hours. Hemoglobin A1C: No results for input(s): HGBA1C in the last 72 hours.  Fasting Lipid Panel: No results for input(s): CHOL, HDL, LDLCALC, TRIG, CHOLHDL, LDLDIRECT in the last 72 hours.  Thyroid Function Tests: No results for input(s): TSH, T4TOTAL, T3FREE, THYROIDAB in the last 72 hours.  Invalid input(s): FREET3 Anemia Panel: No results for input(s): VITAMINB12, FOLATE, FERRITIN, TIBC, IRON, RETICCTPCT in the last 72 hours.   CT ABDOMEN PELVIS W CONTRAST Result Date: 02/03/2024 EXAM: CT ABDOMEN AND PELVIS WITH CONTRAST 02/03/2024 02:04:21 AM TECHNIQUE: CT of the abdomen and pelvis was performed with the administration of 100 mL of iohexol  (OMNIPAQUE ) 300 MG/ML  solution. Multiplanar reformatted images are provided for review. Automated exposure control, iterative reconstruction, and/or weight-based adjustment of the mA/kV was utilized to reduce the radiation dose to as low as reasonably achievable. COMPARISON: CT-guided splenic collection pigtail catheter placement images from 01/26/2024, CTA abdomen and pelvis 01/25/2024, CT abdomen and pelvis without contrast 09/07/2023. CLINICAL HISTORY: Intra-abdominal abscess. FINDINGS: LOWER CHEST:  Diffuse bronchial thickening in the lung bases. Infrahilar opacities in both lower lobes, which could be due to atelectasis or pneumonia. Mild features of interstitial edema in both lung bases. Calcification and thickening of the aortic valve leaflets, with mild cardiomegaly and 3-vessel coronary calcifications. Bilateral layering minimal pleural effusions, both increased. No pericardial effusion. There is discoid subareolar gynecomastia in both breasts. LIVER: Increased intrahepatic periportal edema, which is usually due to fluid overload or congestion. No liver masses. GALLBLADDER AND BILE DUCTS: Multiple tiny layering stones in the gallbladder without wall thickening. No biliary ductal dilatation. SPLEEN: An intraparenchymal fluid collection is again noted within the spleen. There is trace air in the fluid. The pigtail catheter previously in the collection has been removed (since placement on 01/26/2024). The collection today measures 9.3 cm in length, and 2.6 x 2.4 cm transverse and AP on series 2, axial 23. The collection previously measured 11.5 x 4 x 4.1 cm, respectively. There is a fluid density wedge shaped area in the lateral aspect of the spleen measuring 2.1 cm (previously 2.7 cm), probably either an infarct or subcapsular bleed, decreased in size. PANCREAS: No acute abnormality. ADRENAL GLANDS: No adrenal mass. 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 A similar appearance was seen on prior studies including back to 2017. Consider MRI to exclude solid lesions. No urinary stone or obstruction noted. The bladder is thickened but also partially contracted, with impression by the enlarged prostate into the bladder base. GI AND BOWEL: Stomach demonstrates no acute abnormality. There is no bowel obstruction or inflammation. Moderate fecal stasis is seen as before. PERITONEUM AND RETROPERITONEUM: Small volume of  pelvic ascites. Mild mesenteric congestive changes are noted. No free air. No free hemorrhage or localizing inflammatory process. VASCULATURE: Aorta is normal in caliber. Aortic and branch vessel atherosclerosis chronically noted. LYMPH NODES: No lymphadenopathy. REPRODUCTIVE ORGANS: The prostate is 5.5 cm, causing compression into the bladder base. BONES AND SOFT TISSUES: Degenerative change of the lumbar spine, grade 1 degenerative anterolisthesis L4-L5 and moderate L4-L5 acquired spinal stenosis. Ankylosis of both SI joints and bilateral hip arthrosis are again shown. No acute or other significant osseous findings. Increased body wall edema in the flanks and thighs. Small umbilical fat hernia. IMPRESSION: 1. Intraparenchymal fluid collection within the spleen with trace air, measuring 9.3 x 2.6 x 2.4 cm, larger previously, status post pigtail catheter removal. Persisting abscess not excluded. 2. Wedge-shaped fluid density focus in the lateral spleen, possibly infarct or subcapsular bleed, decreased in size to 2.1 cm. 3. Extensive polycystic kidney disease, stable appearance, and consider MRI to exclude solid lesions. 4. Mild mesenteric congestive changes with increased body wall edema in the flanks and thighs, with small volume of pelvic ascites. 5. Cardiomegaly with small pleural effusions, and opacities in the lower lobes which could be atelectasis or pneumonia. 6. Enlarged prostate with bladder mass effect, with thickening in the bladder consistent with underdistention or hypertrophy. Underlying cystitis not strictly excluded. Electronically signed by: Francis Quam MD 02/03/2024 02:41 AM EST RP Workstation: [id number]      ECHO 07/2023: 1. Left  ventricular ejection fraction, by estimation, is 55 to 60%. The left ventricle has normal function. The left ventricle has no regional wall motion abnormalities. The left ventricular internal cavity size was mildly dilated. There is moderate concentric left  ventricular hypertrophy. Left ventricular diastolic parameters are consistent with Grade III diastolic dysfunction  (restrictive).   2. Right ventricular systolic function is normal. The right ventricular  size is normal.   3. Left atrial size was moderately dilated.   4. Right atrial size was mild to moderately dilated.   5. The mitral valve is grossly normal. Trivial mitral valve regurgitation.   6. Tricuspid valve regurgitation is mild to moderate.   7. The aortic valve is grossly normal. Aortic valve regurgitation is  mild. Mild aortic valve stenosis.    TELEMETRY (personally reviewed): Atrial flutter rate 100s  EKG (personally reviewed): Atrial flutter rate 111 bpm, PVCs  DATA reviewed by me 02/03/24: last 24h vitals tele labs imaging I/O, hospitalist progress note, PCCM notes  Principal Problem:   Chest pain Active Problems:   Chronic atrial fibrillation (HCC)   Type 2 diabetes mellitus with chronic kidney disease, without long-term current use of insulin  (HCC)   BPH (benign prostatic hyperplasia)   End-stage renal disease on hemodialysis (HCC)   Influenza   Pressure injury of skin   Bacteremia   Splenic abscess   Bacteremia due to Enterococcus    ASSESSMENT AND PLAN: Jeffery Joseph is a 84 y.o. 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 Cardiology was consulted for further evaluation.   # Chronic atrial flutter # Acute on chronic HFpEF # ESRD on HD # Anemia Patient presented with worsening shortness of breath and chest tightness.  EKG without acute ischemic changes but did demonstrate atrial flutter which he has a known history of.  Hemoglobin was 4.5 on admission and he received transfusion, up to 9.1 this a.m.  Started on IV heparin . - Continue Eliquis  2.5 mg twice daily for  stroke risk reduction (dose appropriate given age, renal function). Ok to switch to heparin  if patient needs IR procedure for spleen abscess. - MRI brain revealed acute stroke, CTA suggestive of splenic infarct. S/p IR drain 01/26/24.  Blood cultures positive for Enterococcus faecalis.  ID recommending TEE, however given his poor baseline status, continued vasopressor requirement and considering he would not be valve surgery candidate, will defer TEE. This was discussed with family over the weekend by Dr. Florencio. - Suspect troponin secondary to demand ischemia.  - Continue amiodarone  100 mg daily.  Dose was decreased at outpatient appointment due to borderline slow heart rate.  Could consider transitioning to IV infusions however rates have remained stable and he appears relatively asymptomatic. - Continue atorvastatin  80 mg daily. - HD as per nephrology, appreciate their recommendations.  This patient's case was discussed and created with Dr. Florencio and he is in agreement.  Signed:  Danita Bloch, PA-C  02/03/2024, 7:52 AM [facility] Cardiology

## 2024-02-04 MED ORDER — DIPHENHYDRAMINE HCL 50 MG/ML IJ SOLN: 25.0000 mg | INTRAMUSCULAR | Status: DC | PRN

## 2024-02-04 MED ORDER — ACETAMINOPHEN 650 MG RE SUPP: 650.0000 mg | Freq: Four times a day (QID) | RECTAL | Status: DC | PRN

## 2024-02-04 MED ORDER — ACETAMINOPHEN 325 MG PO TABS: 650.0000 mg | ORAL_TABLET | Freq: Four times a day (QID) | ORAL | Status: DC | PRN

## 2024-02-04 MED ORDER — LORAZEPAM 2 MG/ML IJ SOLN: 2.0000 mg | INTRAMUSCULAR | Status: DC | PRN

## 2024-02-04 MED ORDER — HYDROMORPHONE BOLUS VIA INFUSION: 1.0000 mg | INTRAVENOUS | Status: DC | PRN

## 2024-02-04 MED ORDER — HYDROMORPHONE HCL-NACL 50-0.9 MG/50ML-% IV SOLN
0.0000 mg/h | INTRAVENOUS | Status: DC
  Filled 2024-02-04: qty 50

## 2024-02-04 MED ORDER — HALOPERIDOL LACTATE 5 MG/ML IJ SOLN: 2.5000 mg | INTRAMUSCULAR | Status: DC | PRN

## 2024-02-13 NOTE — Progress Notes (Signed)
 SPIRITUAL CARE AND COUNSELING CONSULT NOTE   VISIT SUMMARY   SPIRITUAL ENCOUNTER                                                                                                                                                                      Type of Visit: Follow up Care provided to:: Family Referral source: Chaplain assessment Reason for visit: Patient death OnCall Visit: No   SPIRITUAL FRAMEWORK      GOALS       INTERVENTIONS   Spiritual Care Interventions Made: Compassionate presence, Reflective listening, Encouragement, Supported grief process    INTERVENTION OUTCOMES   Outcomes: Awareness of support  SPIRITUAL CARE PLAN   Spiritual Care Issues Still Outstanding: No further spiritual care needs at this time (see row info)    If immediate needs arise, please contact [facility] 24 hour on call [phone]   Sari Hugh, Chaplain  01/28/2024 3:04 PM

## 2024-02-13 NOTE — Death Summary Note (Signed)
 " DEATH SUMMARY   Patient Details  Name: Jeffery Joseph MRN: [id number] DOB: Nov 28, 1940  Admission/Discharge Information   Admit Date:  02/19/2024  Date of Death: Date of Death: 03-03-24  Time of Death: Time of Death: 1331-03-15  Length of Stay: 03-23-24  Referring Physician: Epifanio Alm SQUIBB, MD   Reason(s) for Hospitalization  Septic Shock Enterococcus Faecalis Bacteremia Splenic Abscess  ESRD on Hemodialysis Type II Diabetes Mellitus  Chronic Atrial Fibrillation  Dementia   Diagnoses  Preliminary cause of death: Enterococcus Faecalis Bacteremia  Secondary Diagnoses (including complications and co-morbidities):  Principal Problem:   Chest pain Active Problems:   Chronic atrial fibrillation (HCC)   Type 2 diabetes mellitus with chronic kidney disease, without long-term current use of insulin  (HCC)   BPH (benign prostatic hyperplasia)   End-stage renal disease on hemodialysis (HCC)   Influenza   Pressure injury of skin   Bacteremia   Splenic abscess   Bacteremia due to Enterococcus   Brief Hospital Course (including significant findings, care, treatment, and services provided and events leading to death)  Jeffery Joseph is a 84 year old male with history of ESRD on hemodialysis (MWF), T2DM, HTN, PAD, chronic atrial fibrillation on anticoagulation, chronic anemia, anxiety/depression, and BPH presented to the ED with acute onset dyspnea and chest tightness    Patient described symptoms as indigestion and sensation of food not passing, associated with diaphoresis, fever, and chills. Symptoms began several days prior to presentation. Patient was taking oseltamivir  for possible influenza. He reported dry cough and denied nausea, vomiting, abdominal pain, diarrhea, melena, or hematochezia. Last hemodialysis session was on Friday prior to admission.  ED Course: On arrival, vital signs: BP 106/49 mmHg, HR 111 bpm, RR 20, T 26F, SpO? 100% on room air. Initial labs showed WBC 11.8 K/L, Hgb  8.0 g/dL, platelets 790 K/L, sodium 134 mmol/L, potassium 3.5 mmol/L, BUN 46 mg/dL, creatinine 4.7 mg/dL, lactate 2.2 mmol/L, AST 73 U/L, ALT 70 U/L. Chest X-ray demonstrated cardiomegaly without acute consolidation. CT chest without contrast showed cardiomegaly and no acute airspace disease. Patient received 500 mL IV normal saline bolus, famotidine , and antacids and was admitted to TRH service for observation.  Significant Hospital Events: Including procedures, antibiotic start and stop dates in addition to other pertinent events   1/11: Admitted to trh service 1/12: Became hypotensive started on levophed , critical care consulted 1/13: Pt continues to require levophed  gtt complains of his stomach not feeling good CT   Abd Pelvis pending concerning for splenic fluid collection concerning for possible abscess, Surgery and IR consulted, recommends aspiration and drain placement.  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 1/14: No acute events overnight, afebrile, remains on Levophed  (weaned to 6 mcg).  More awake and alert, consult Neurology for acute CVA on MRI yesterday.  Tentative plan for IR to perform splenic aspiration/drain today.  Cardiology tentatively planning for TEE tomorrow. 01/27/24- patient with sepsis bacteremia, for TEE today.   Had splenic drain (cultures with GPC+) and dialysis yesterday. ID on case is on ampicillin .  Concern for AV graft fistula contamination.   01/28/24- less output from splenic drain this am, making some urine and gets dialysis.  He's on levophed  14mcg. Starting solucortef for suspected arenal insufficiency.   01/30/24- patient is on levophed  weaned from 12 to 9 this morning.  Mental status is with intermittent confusion.  Cbc with stable leukocytosis and chornic anemia, abscess culture with klebsiella , blood culture with enterococcus.  Treated with ampicillin /rocephin .  Family  at bedside today, declined IV fluids post discussion of medical plan.  Remains on midodrine  15tid and levophed . Poor prognosis DNR/DNI working with palliative.  01/31/24- patient reports constipation.  He is hypotensive on levophed  and is on day 4 abx with ampicillin  and rocephin  BID.  02/01/24- mental status is improved, levophed  is down to 4.  Continuing midodrine .  Splenic drain is removed. Cardiology, nephrology, ID is following. Amp/rocephin  day 5.  HD on MWF.  02/02/24- patient is better this morning, he requests left TLC to be removed. We will do that after HD. He's off pressors this am.  He has new splenic collection and IR has evaluated with no drain recommended at this time.  02/03/24- patient had surgery eval for splenectomy, not a surgical candidate.  Palliative care is following.  Transitioned to COMFORT CARE later in the evening. 02/26/24: On morphine  infusion, resting comfortably.  Expired in the afternoon.   Pertinent Labs and Studies  Significant Diagnostic Studies CT ABDOMEN PELVIS W CONTRAST Result Date: 02/03/2024 EXAM: CT ABDOMEN AND PELVIS WITH CONTRAST 02/03/2024 02:04:21 AM TECHNIQUE: CT of the abdomen and pelvis was performed with the administration of 100 mL of iohexol  (OMNIPAQUE ) 300 MG/ML solution. Multiplanar reformatted images are provided for review. Automated exposure control, iterative reconstruction, and/or weight-based adjustment of the mA/kV was utilized to reduce the radiation dose to as low as reasonably achievable. COMPARISON: CT-guided splenic collection pigtail catheter placement images from 01/26/2024, CTA abdomen and pelvis 01/25/2024, CT abdomen and pelvis without contrast 09/07/2023. CLINICAL HISTORY: Intra-abdominal abscess. FINDINGS: LOWER CHEST: Diffuse bronchial thickening in the lung bases. Infrahilar opacities in both lower lobes, which could be due to atelectasis or pneumonia. Mild features of interstitial edema in both lung bases. Calcification and thickening of the aortic valve leaflets, with mild cardiomegaly and 3-vessel  coronary calcifications. Bilateral layering minimal pleural effusions, both increased. No pericardial effusion. There is discoid subareolar gynecomastia in both breasts. LIVER: Increased intrahepatic periportal edema, which is usually due to fluid overload or congestion. No liver masses. GALLBLADDER AND BILE DUCTS: Multiple tiny layering stones in the gallbladder without wall thickening. No biliary ductal dilatation. SPLEEN: An intraparenchymal fluid collection is again noted within the spleen. There is trace air in the fluid. The pigtail catheter previously in the collection has been removed (since placement on 01/26/2024). The collection today measures 9.3 cm in length, and 2.6 x 2.4 cm transverse and AP on series 2, axial 23. The collection previously measured 11.5 x 4 x 4.1 cm, respectively. There is a fluid density wedge shaped area in the lateral aspect of the spleen measuring 2.1 cm (previously 2.7 cm), probably either an infarct or subcapsular bleed, decreased in size. PANCREAS: No acute abnormality. ADRENAL GLANDS: No adrenal mass. 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 A similar appearance was seen on prior studies including back to 2017. Consider MRI to exclude solid lesions. No urinary stone or obstruction noted. The bladder is thickened but also partially contracted, with impression by the enlarged prostate into the bladder base. GI AND BOWEL: Stomach demonstrates no acute abnormality. There is no bowel obstruction or inflammation. Moderate fecal stasis is seen as before. PERITONEUM AND RETROPERITONEUM: Small volume of pelvic ascites. Mild mesenteric congestive changes are noted. No free air. No free hemorrhage or localizing inflammatory process. VASCULATURE: Aorta is normal in caliber. Aortic and branch vessel atherosclerosis chronically noted. LYMPH NODES: No lymphadenopathy. REPRODUCTIVE ORGANS: The  prostate is 5.5 cm, causing  compression into the bladder base. BONES AND SOFT TISSUES: Degenerative change of the lumbar spine, grade 1 degenerative anterolisthesis L4-L5 and moderate L4-L5 acquired spinal stenosis. Ankylosis of both SI joints and bilateral hip arthrosis are again shown. No acute or other significant osseous findings. Increased body wall edema in the flanks and thighs. Small umbilical fat hernia. IMPRESSION: 1. Intraparenchymal fluid collection within the spleen with trace air, measuring 9.3 x 2.6 x 2.4 cm, larger previously, status post pigtail catheter removal. Persisting abscess not excluded. 2. Wedge-shaped fluid density focus in the lateral spleen, possibly infarct or subcapsular bleed, decreased in size to 2.1 cm. 3. Extensive polycystic kidney disease, stable appearance, and consider MRI to exclude solid lesions. 4. Mild mesenteric congestive changes with increased body wall edema in the flanks and thighs, with small volume of pelvic ascites. 5. Cardiomegaly with small pleural effusions, and opacities in the lower lobes which could be atelectasis or pneumonia. 6. Enlarged prostate with bladder mass effect, with thickening in the bladder consistent with underdistention or hypertrophy. Underlying cystitis not strictly excluded. Electronically signed by: Francis Quam MD 02/03/2024 02:41 AM EST RP Workstation: [id number]   DG Chest Port 1 View Result Date: 01/30/2024 EXAM: 1 VIEW(S) XRAY OF THE CHEST 01/30/2024 01:55:51 PM COMPARISON: 01/27/2024 CLINICAL HISTORY: Infiltrate of both lungs present on imaging study. FINDINGS: LINES, TUBES AND DEVICES: Left IJ central venous catheter in place with tip overlying proximal SVC. Left axillary vascular stent noted. LUNGS AND PLEURA: Low lung volume. Mild-to-moderate pulmonary edema. Hazy opacity at left lung base, decreased from prior study. Probable associated atelectasis at left lung base. Small left pleural effusion. No pneumothorax. HEART AND  MEDIASTINUM: Stable mild cardiomegaly. Aortic arch calcifications. BONES AND SOFT TISSUES: No acute osseous abnormality. IMPRESSION: 1. Mild-to-moderate pulmonary edema. 2. Decreased hazy opacity at the left lung base, likely atelectasis. 3. Small left pleural effusion. Electronically signed by: Lonni Necessary MD 01/30/2024 06:41 PM EST RP Workstation: [id number]   US  Carotid Bilateral Result Date: 01/28/2024 EXAM: US  CAROTID DUPLEX 01/27/2024 05:06:51 PM TECHNIQUE: Real-time grayscale, color flow and spectral Doppler sonographic images were obtained of the extracranial carotid system using a linear transducer. COMPARISON: None available CLINICAL HISTORY: [id number] Stroke (cerebrum) (HCC) [id number] Stroke (cerebrum) (HCC) Hypertension, diabetes. FINDINGS: RIGHT: Common carotid artery: 96 cm/s. Mild intimal thickening through the right common carotid artery with some eccentric calcified plaque. Internal carotid artery: 100 cm/s. Partial calcified plaque in the bulb and proximal ICA resulting in some areas of distal acoustic shadowing. Normal waveforms and color Doppler signal in visualized segments. External carotid artery: 72 cm/s Right ICA/CCA ratio: 1.0 Plaque: Moderate, Calcified. Vertebral artery: Antegrade LEFT: Common carotid artery: 158 cm/s. Mild tortuosity of the common carotid. Partially calcified plaque throughout the length of the common carotid without high-grade stenosis. Internal carotid artery: 55 cm/s. Mild partial calcified plaque in the bulb without high-grade stenosis. Focal area of distal acoustic shadowing in the proximal ICA. Normal color Doppler signal throughout. External carotid artery: 79 cm/s LEFT ICA/CCA ratio: 0.3 Plaque: Moderate, Partially calcified. Vertebral artery: Antegrade IMPRESSION: 1. No hemodynamically significant stenosis (greater than 50%) within the extracranial internal carotid arteries. 2. Mild intimal thickening and eccentric calcified plaque in the right common  carotid artery with partial calcified plaque in the right bulb and proximal ICA. 3. Partially calcified plaque throughout the left common carotid artery and mild partial calcified plaque in the left bulb, without high-grade stenosis. 4. Antegrade flow within the bilateral vertebral arteries. Electronically signed by: Dayne Hassell MD 01/28/2024  07:18 AM EST RP Workstation: [id number]   MR ANGIO HEAD WO CONTRAST Result Date: 01/28/2024 CLINICAL DATA:  Follow-up examination for acute stroke. EXAM: MRA HEAD WITHOUT CONTRAST TECHNIQUE: Angiographic images of the Circle of Willis were acquired using MRA technique without intravenous contrast. COMPARISON:  Comparison made with prior brain MRI from 01/25/2024. FINDINGS: Anterior circulation: Examination degraded by motion artifact. Both internal carotid arteries are patent through the siphons without convincing stenosis or other abnormality. Apparent irregularity and narrowing about the supraclinoid right ICA on MIP reconstructions appears to be most consistent with artifact on source time-of-flight images (series 1015, image 11). A1 segments patent bilaterally. Normal anterior communicating artery complex. Both ACAs are patent to their distal aspects without visible stenosis. No M1 stenosis or occlusion. No proximal MCA branch occlusion or high-grade stenosis. Distal MCA branches are perfused and symmetric. Posterior circulation: Right vertebral artery dominant and widely patent. Diminutive left vertebral artery patent as well without visible stenosis. Right PICA patent. Left PICA not seen. Basilar patent without stenosis. Superior cerebellar and posterior cerebral arteries patent bilaterally. Anatomic variants: Dominant right vertebral artery. Other: No intracranial aneurysm. IMPRESSION: Negative intracranial MRA. No large vessel occlusion. No hemodynamically significant or correctable stenosis. Electronically Signed   By: Morene Hoard M.D.   On: 01/28/2024  00:51   DG Chest Port 1 View Result Date: 01/27/2024 CLINICAL DATA:  Central line EXAM: PORTABLE CHEST 1 VIEW COMPARISON:  01/23/2024, 01/22/2024, 07/19/2023 FINDINGS: Left IJ central venous catheter tip overlies the brachiocephalic confluence. Cardiomegaly with central vascular congestion. Worsening airspace disease at the left lung base compared to prior. No evidence of a pneumothorax. Left axillary stent. IMPRESSION: 1. Left IJ central venous catheter tip overlies the brachiocephalic confluence. No pneumothorax. 2. Cardiomegaly with central vascular congestion and mild edema. Worsening airspace disease at the left lung base, atelectasis versus pneumonia. Electronically Signed   By: Luke Bun M.D.   On: 01/27/2024 15:29   CT GUIDED PERITONEAL/RETROPERITONEAL FLUID DRAIN BY PERC CATH Result Date: 01/26/2024 INDICATION: 84 year old male with sepsis and new low-attenuation fluid collection versus liquified infarct in the spleen. He presents for CT-guided aspiration with possible drain placement. EXAM: CT-guided drain placement TECHNIQUE: Multidetector CT imaging of the abdomen was performed following the standard protocol without IV contrast. RADIATION DOSE REDUCTION: This exam was performed according to the departmental dose-optimization program which includes automated exposure control, adjustment of the mA and/or kV according to patient size and/or use of iterative reconstruction technique. MEDICATIONS: The patient is currently admitted to the hospital and receiving intravenous antibiotics. The antibiotics were administered within an appropriate time frame prior to the initiation of the procedure. ANESTHESIA/SEDATION: Moderate (conscious) sedation was employed during this procedure. A total of Versed  0.5 mg and Fentanyl  25 mcg was administered intravenously by the radiology nurse. Total intra-service moderate Sedation Time: 10 minutes. The patient's level of consciousness and vital signs were monitored  continuously by radiology nursing throughout the procedure under my direct supervision. COMPLICATIONS: None immediate. PROCEDURE: Informed written consent was obtained from the patient after a thorough discussion of the procedural risks, benefits and alternatives. All questions were addressed. Maximal Sterile Barrier Technique was utilized including caps, mask, sterile gowns, sterile gloves, sterile drape, hand hygiene and skin antiseptic. A timeout was performed prior to the initiation of the procedure. A planning axial CT scan was performed. The low-attenuation region in the spleen was identified. A skin entry site was selected and marked. The skin was sterilely prepped and draped in the standard fashion using chlorhexidine   skin prep. Local anesthesia was attained by infiltration with 1% lidocaine . A small dermatotomy was made. Under intermittent CT guidance, an 18 gauge trocar needle was advanced into the fluid collection. Aspiration yields foul-smelling thin reddish brown fluid. It is unclear if this represents liquified hematoma or simple abscess. Regardless, the foul small suggests infection and therefore drain placement will be performed. A 0.035 wire was coiled in the collection. The trocar needle was removed. The percutaneous tract was dilated to 12 French. A 12 French all-purpose drainage catheter was advanced over the wire and formed. Aspiration yields 90 mL of the brownish red foul-smelling fluid. Samples were sent for Gram stain and culture. Follow-up CT imaging demonstrates a well-positioned drainage catheter with no evidence of hemorrhage. The pleura remains cephalad to the drain. No evidence of transgression. IMPRESSION: Successful placement of a 12 French drainage catheter into the splenic fluid collection yielding 90 mL of reddish brown foul-smelling fluid. Samples were sent for Gram stain and culture. PLAN: Maintain drain to JP bulb suction. Do not flush. Recommend removal of drainage catheter as  soon as possible (once output is scant for 48 hours). Electronically Signed   By: Wilkie Lent M.D.   On: 01/26/2024 12:55   CT Angio Abd/Pel w/ and/or w/o Result Date: 01/26/2024 CLINICAL DATA:  Splenic fluid collection. Possible infarct versus abscess. EXAM: CTA ABDOMEN AND PELVIS WITHOUT AND WITH CONTRAST TECHNIQUE: Multidetector CT imaging of the abdomen and pelvis was performed using the standard protocol during bolus administration of intravenous contrast. Multiplanar reconstructed images and MIPs were obtained and reviewed to evaluate the vascular anatomy. RADIATION DOSE REDUCTION: This exam was performed according to the departmental dose-optimization program which includes automated exposure control, adjustment of the mA and/or kV according to patient size and/or use of iterative reconstruction technique. CONTRAST:  OMNIPAQUE  IOHEXOL  350 MG/ML SOLN COMPARISON:  CT scan without contrast performed yesterday 01/25/2024 FINDINGS: VASCULAR Aorta: Normal caliber aorta without aneurysm, dissection, vasculitis or significant stenosis. Scattered atherosclerotic vascular calcifications. 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 The left hepatic artery is replaced to the left gastric artery. No aneurysm or dissection. SMA: Patent without evidence of aneurysm, dissection, vasculitis or significant stenosis. Renals: Solitary renal arteries bilaterally. Calcified plaque results in high-grade stenosis of the left renal artery. The right renal artery is patent but relatively small in caliber. No aneurysm or changes of FMD. IMA: Patent without evidence of aneurysm, dissection, vasculitis or significant stenosis. Inflow: Patent without evidence of aneurysm, dissection, vasculitis or significant stenosis. Proximal Outflow: Bilateral common femoral and visualized portions of the superficial and profunda femoral arteries are patent without  evidence of aneurysm, dissection, vasculitis or significant stenosis. Veins: Widely patent portal, splenic and visceral veins. Review of the MIP images confirms the above findings. NON-VASCULAR Lower chest: Dependent atelectasis bilaterally. Cardiomegaly. Calcified atherosclerotic plaque along the coronary arteries. Thickening calcified aortic valve. Hepatobiliary: Normal hepatic contour and morphology. No discrete hepatic lesions. Normal appearance of the gallbladder. No intra or extrahepatic biliary ductal dilatation. Pancreas: Unremarkable. No pancreatic ductal dilatation or surrounding inflammatory changes. Spleen: Oblong rounded hypoechoic collection in the subcapsular aspect of the spleen extends from cranial to caudal and measures 12.0 x 4.2 x 4.4 cm. The collection is circumscribed. No evidence of peripheral enhancement or hyperemia. There is a second smaller wedge-shaped focus of ill-defined low attenuation in the anterior lower pole of the spleen. Overall, these findings suggest a combination of acute and subacute splenic  infarcts. Adrenals/Urinary Tract: Normal adrenal glands. 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 Similar findings have been noted on multiple prior examinations. Stomach/Bowel: Stomach is within normal limits. Appendix appears normal. No evidence of bowel wall thickening, distention, or inflammatory changes. Lymphatic: No suspicious lymphadenopathy. Reproductive: Prostatomegaly. Other: No abdominal wall hernia or abnormality. No abdominopelvic ascites. Musculoskeletal: No acute fracture or aggressive appearing lytic or blastic osseous lesion. Lower lumbar degenerative disc disease and facet arthropathy. IMPRESSION: 1. Oblong low-attenuation fluid collection in the subcapsular periphery of the spleen extending from cranial to caudal. There is a second smaller wedge-shaped and less well-defined region of decreased enhancement in the inferior and anterior  aspect of the splenic tip. Overall findings are most suggestive of a combination of acute and subacute splenic infarct. No peripheral enhancement, hyperemia or internal gas to suggest the presence of infection. 2. 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 3. 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 4. Extensive cystic changes of both kidneys with cysts of variable density and complexity as seen on prior imaging. As before, outpatient MRI with gadolinium contrast could be considered to exclude neoplasm if clinically warranted. 5. Additional ancillary findings as above. Electronically Signed   By: Wilkie Lent M.D.   On: 01/26/2024 08:46   MR BRAIN WO CONTRAST Result Date: 01/25/2024 EXAM: MRI BRAIN WITHOUT CONTRAST 01/25/2024 02:21:13 PM TECHNIQUE: Multiplanar multisequence MRI of the head/brain was performed without the administration of intravenous contrast. COMPARISON: None available. CLINICAL HISTORY: Mental status change, unknown cause. The patient presents with a mental status change of unknown cause. 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 T2 hyperintensities in the cerebral white matter bilaterally are nonspecific but compatible with mild chronic small vessel ischemic disease. There is mild to moderate cerebral atrophy. No intracranial hemorrhage, mass, midline shift, hydrocephalus, or extra-axial fluid collection is identified. Major intracranial vascular flow voids are preserved. ORBITS: No significant abnormality. SINUSES AND MASTOIDS: No significant abnormality. BONES AND SOFT TISSUES: Normal marrow signal. No significant soft tissue abnormality. IMPRESSION: 1. 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 2. Mild chronic small vessel ischemic disease. Electronically signed by: Dasie Hamburg MD 01/25/2024 04:40 PM EST RP Workstation: [id number]    ECHOCARDIOGRAM COMPLETE Result Date: 01/25/2024    ECHOCARDIOGRAM REPORT   Patient Name:   Jeffery Joseph Date of Exam: 01/25/2024 Medical Rec #:  [id number]     Height:       77.0 in Accession #:    [id number]    Weight:       219.1 lb Date of Birth:  1940-10-13     BSA:          2.325 m Patient Age:    83 years      BP:           111/47 mmHg Patient Gender: M             HR:           113 bpm. Exam Location:  [facility] Procedure: 2D Echo, Cardiac Doppler, Color Doppler, 3D Echo and Strain Analysis            (Both Spectral and Color Flow Doppler were utilized during            procedure). Indications:     Shock R57.9  History:         Patient has  prior history of Echocardiogram examinations, most                  recent 07/18/2023. Risk Factors:Diabetes and Hypertension.  Sonographer:     Christopher Furnace Referring Phys:  [id number] LONELL KANDICE MOOSE Diagnosing Phys: Cara JONETTA Lovelace MD  Sonographer Comments: Global longitudinal strain was attempted. IMPRESSIONS  1. Left ventricular ejection fraction, by estimation, is 45 to 50%. The left ventricle has mildly decreased function. The left ventricle demonstrates global hypokinesis. The left ventricular internal cavity size was mildly dilated. There is mild asymmetric left ventricular hypertrophy. Left ventricular diastolic parameters are consistent with Grade II diastolic dysfunction (pseudonormalization). The average left ventricular global longitudinal strain is 6.7 %. The global longitudinal strain is abnormal.  2. Right ventricular systolic function is low normal. The right ventricular size is mildly enlarged.  3. Left atrial size was mildly dilated.  4. The mitral valve is grossly normal. Mild to moderate mitral valve regurgitation.  5. Tricuspid valve regurgitation is mild to moderate.  6. The aortic valve is calcified. Aortic valve regurgitation is mild. Mild aortic valve stenosis. FINDINGS  Left Ventricle: Left ventricular ejection fraction, by estimation, is 45 to 50%. The  left ventricle has mildly decreased function. The left ventricle demonstrates global hypokinesis. The average left ventricular global longitudinal strain is 6.7 %. Strain was performed and the global longitudinal strain is abnormal. The left ventricular internal cavity size was mildly dilated. There is mild asymmetric left ventricular hypertrophy. Left ventricular diastolic parameters are consistent with Grade II diastolic dysfunction (pseudonormalization). Right Ventricle: The right ventricular size is mildly enlarged. No increase in right ventricular wall thickness. Right ventricular systolic function is low normal. Left Atrium: Left atrial size was mildly dilated. Right Atrium: Right atrial size was normal in size. Pericardium: There is no evidence of pericardial effusion. Mitral Valve: The mitral valve is grossly normal. Mild to moderate mitral valve regurgitation. MV peak gradient, 12.4 mmHg. The mean mitral valve gradient is 6.0 mmHg. Tricuspid Valve: The tricuspid valve is normal in structure. Tricuspid valve regurgitation is mild to moderate. Aortic Valve: The aortic valve is calcified. Aortic valve regurgitation is mild. Aortic regurgitation PHT measures 206 msec. Mild aortic stenosis is present. Aortic valve mean gradient measures 16.2 mmHg. Aortic valve peak gradient measures 30.7 mmHg. Aortic valve area, by VTI measures 1.51 cm. Pulmonic Valve: The pulmonic valve was not well visualized. Pulmonic valve regurgitation is not visualized. Aorta: The aortic root was not well visualized. IAS/Shunts: No atrial level shunt detected by color flow Doppler. Additional Comments: 3D was performed not requiring image post processing on an independent workstation and was abnormal.  LEFT VENTRICLE PLAX 2D LVIDd:         5.20 cm      Diastology LVIDs:         3.90 cm      LV e' medial:    3.59 cm/s LV PW:         1.10 cm      LV E/e' medial:  39.0 LV IVS:        1.40 cm      LV e' lateral:   8.27 cm/s LVOT diam:      2.00 cm      LV E/e' lateral: 16.9 LV SV:         62 LV SV Index:   26           2D Longitudinal Strain LVOT Area:     3.14 cm  2D Strain GLS (A4C):   5.9 % LV IVRT:       48 msec      2D Strain GLS (A3C):   6.1 %                             2D Strain GLS (A2C):   8.2 %                             2D Strain GLS Avg:     6.7 % LV Volumes (MOD) LV vol d, MOD A2C: 147.0 ml LV vol d, MOD A4C: 112.0 ml LV vol s, MOD A2C: 83.5 ml LV vol s, MOD A4C: 72.4 ml LV SV MOD A2C:     63.5 ml LV SV MOD A4C:     112.0 ml LV SV MOD BP:      47.7 ml RIGHT VENTRICLE RV Basal diam:  4.00 cm RV Mid diam:    3.50 cm RV S prime:     11.90 cm/s TAPSE (M-mode): 1.8 cm LEFT ATRIUM             Index        RIGHT ATRIUM           Index LA diam:        4.00 cm 1.72 cm/m   RA Area:     15.20 cm LA Vol (A2C):   97.8 ml 42.06 ml/m  RA Volume:   31.10 ml  13.37 ml/m LA Vol (A4C):   91.0 ml 39.13 ml/m LA Biplane Vol: 96.0 ml 41.28 ml/m  AORTIC VALVE AV Area (Vmax):    1.27 cm AV Area (Vmean):   1.28 cm AV Area (VTI):     1.51 cm AV Vmax:           277.25 cm/s AV Vmean:          183.000 cm/s AV VTI:            0.409 m AV Peak Grad:      30.7 mmHg AV Mean Grad:      16.2 mmHg LVOT Vmax:         112.00 cm/s LVOT Vmean:        74.500 cm/s LVOT VTI:          0.196 m LVOT/AV VTI ratio: 0.48 AI PHT:            206 msec  AORTA Ao Root diam: 2.70 cm MITRAL VALVE                  TRICUSPID VALVE MV Area (PHT): 5.31 cm       TR Peak grad:   26.4 mmHg MV Area VTI:   2.15 cm       TR Vmax:        257.00 cm/s MV Peak grad:  12.4 mmHg MV Mean grad:  6.0 mmHg       SHUNTS MV Vmax:       1.76 m/s       Systemic VTI:  0.20 m MV Vmean:      119.0 cm/s     Systemic Diam: 2.00 cm MV Decel Time: 143 msec MR Peak grad:    108.6 mmHg MR Mean grad:    74.0 mmHg MR Vmax:         521.00 cm/s MR Vmean:  418.0 cm/s MR PISA:         4.02 cm MR PISA Eff ROA: 25 mm MR PISA Radius:  0.80 cm MV E velocity: 140.00 cm/s MV A velocity: 114.00 cm/s MV E/A ratio:   1.23 Dwayne D Callwood MD Electronically signed by Cara JONETTA Lovelace MD Signature Date/Time: 01/25/2024/2:49:15 PM    Final    CT ABDOMEN PELVIS WO CONTRAST Result Date: 01/25/2024 EXAM: CT ABDOMEN AND PELVIS WITHOUT CONTRAST 01/25/2024 11:06:03 AM TECHNIQUE: CT of the abdomen and pelvis was performed without the administration of intravenous contrast. Multiplanar reformatted images are provided for review. Automated exposure control, iterative reconstruction, and/or weight-based adjustment of the mA/kV was utilized to reduce the radiation dose to as low as reasonably achievable. COMPARISON: 09/07/2023 CLINICAL HISTORY: Sepsis. FINDINGS: LOWER CHEST: Coronary and aortic atherosclerosis with moderate cardiomegaly. Trace bilateral pleural effusions with mild atelectasis in the lung bases. Hazy airspace opacity medially in the left lower lobe on image 9 series 4 is nonspecific, early bronchopneumonia not excluded. LIVER: The liver is unremarkable. GALLBLADDER AND BILE DUCTS: Contracted gallbladder. No biliary ductal dilatation. SPLEEN: Abnormal hypodense lesions in the spleen measuring up to 10.5 x 4.5 x 4.0 cm (volume = 99 cm\S\3), not present on 09/07/2023. Splenic abscess is not excluded. PANCREAS: No acute abnormality. ADRENAL GLANDS: No acute abnormality. KIDNEYS, URETERS AND BLADDER: Scattered hypodense and hyperdense renal lesions similar to previous, suspicious for polycystic kidney disease. Enhancement not assessed on today's noncontrast examination. Overall morphology similar to the 09/07/2023 exam. No stones in the kidneys or ureters. No hydronephrosis. No perinephric or periureteral stranding. Urinary bladder is unremarkable. GI AND BOWEL: Stomach demonstrates no acute abnormality. Stable dilated appendix at 9 mm diameter on image 73 series 2 with internal high density suspicious for appendicolith, similar appearance on 09/07/2023. Given the chronic prominence, an underlying appendiceal mass cannot be  readily excluded. No current periappendiceal inflammatory findings. There is no bowel obstruction. PERITONEUM AND RETROPERITONEUM: No ascites. No free air. New nonspecific presacral edema. VASCULATURE: Aorta is normal in caliber. Atherosclerosis atheromatous plaque at the origin of the superior mesenteric artery. LYMPH NODES: No lymphadenopathy. REPRODUCTIVE ORGANS: Stable prostatomegaly. BONES AND SOFT TISSUES: Low level subcutaneous edema along the flanks. Bridging spurring of both sacroiliac joints. 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 Grade 1 degenerative anterolisthesis at L4-L5 with lower lumbar spondylosis and degenerative disc disease and suspected bilateral foraminal stenosis at L3-L4 and L4-L5 with borderline bilateral foraminal stenosis at L5-S1. A small umbilical hernia contains adipose tissue. IMPRESSION: 1. Abnormal hypodense lesions in the spleen measuring up to 10.5 x 4.5 x 4.0 cm (volume = 99 cm\S\3), not present on prior exam, raising concern for splenic abscesses or hematomas. 2. Stable dilated appendix at 9 mm diameter with internal high density suspicious for appendicolith. Given the chronic prominence, an underlying appendiceal mass cannot be readily excluded. 3. Hazy airspace opacity medially in the left lower lobe, nonspecific, with early bronchopneumonia not excluded. 4. Scattered hypodense and hyperdense renal lesions are suspicious for polycystic kidney disease. 5. New nonspecific presacral edema. 6. 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 7. Grade 1 degenerative anterolisthesis at L4-5 with lower lumbar spondylosis and degenerative disc disease and suspected bilateral foraminal stenosis at L3-4 and L4-5 with borderline bilateral foraminal stenosis at L5-S1. 8. Coronary and aortic atherosclerosis with moderate cardiomegaly. 9. Trace bilateral pleural effusions with mild atelectasis in the lung bases. 10. Stable prostatomegaly. Impression  #1 will be telephoned to the referring provider or the referring provider's representative  by professional radiology assistant (PRA) personnel, with communication documented in the clario dashboard. Electronically signed by: Ryan Salvage MD MD 01/25/2024 11:37 AM EST RP Workstation: [id number]   DG Chest 1 View Result Date: 01/23/2024 EXAM: 1 VIEW(S) XRAY OF THE CHEST 01/23/2024 10:37:00 PM COMPARISON: 01/22/2024 CLINICAL HISTORY: Lethargy FINDINGS: LINES, TUBES AND DEVICES: Left axillary stent noted. LUNGS AND PLEURA: Increased interstitial markings, lower lung/infrahilar predominant, favoring mild interstitial edema although multifocal infection/pneumonia is also possible. No pleural effusion. No pneumothorax. HEART AND MEDIASTINUM: Stable cardiomegaly. Aortic atherosclerosis. BONES AND SOFT TISSUES: No acute osseous abnormality. IMPRESSION: 1. Increased lower lung/infrahilar predominant interstitial markings, favoring mild interstitial edema over multifocal infection/pneumonia. Electronically signed by: Pinkie Pebbles MD MD 01/23/2024 10:41 PM EST RP Workstation: [id number]   CT Chest Wo Contrast Result Date: 01/22/2024 CLINICAL DATA:  Respiratory illness, nondiagnostic xray, chest pressure, short of breath EXAM: CT CHEST WITHOUT CONTRAST TECHNIQUE: Multidetector CT imaging of the chest was performed following the standard protocol without IV contrast. RADIATION DOSE REDUCTION: This exam was performed according to the departmental dose-optimization program which includes automated exposure control, adjustment of the mA and/or kV according to patient size and/or use of iterative reconstruction technique. COMPARISON:  01/22/2024, 09/07/2023 FINDINGS: Cardiovascular: Unenhanced imaging of the heart demonstrates cardiomegaly without pericardial effusion. Normal caliber of the thoracic aorta. Atherosclerosis of the aorta and coronary vasculature. Assessment of the vascular lumen cannot be performed  without intravenous contrast. Mediastinum/Nodes: No enlarged mediastinal or axillary lymph nodes. Thyroid gland, trachea, and esophagus demonstrate no significant findings. Lungs/Pleura: No acute airspace disease, effusion, or pneumothorax. Dependent hypoventilatory changes are noted. Central airways are patent. Upper Abdomen: Since the prior abdominal CT 09/07/2023, there has been development of borderline splenomegaly measuring 12.7 x 11.9 x 6.8 cm. Within the superolateral aspect of the spleen there is a new indeterminate 6.1 x 6.8 x 4.6 cm hypodensity. Musculoskeletal: No acute or destructive bony abnormalities. Reconstructed images demonstrate no additional findings. IMPRESSION: 1. Cardiomegaly.  No pericardial effusion. 2. No acute airspace disease. 3. Borderline splenomegaly and indeterminate 6.8 cm splenic hypodensity, which have developed in the interim since the 09/07/2023 CT. This is incompletely characterized on this exam, and if further evaluation is clinically indicated a contrasted CT or MRI of the abdomen could be considered. 4. Aortic Atherosclerosis (ICD10-I70.0). Coronary artery atherosclerosis. Electronically Signed   By: Ozell Daring M.D.   On: 01/22/2024 20:00   DG Chest 2 View Result Date: 01/22/2024 CLINICAL DATA:  Chest pain and shortness of breath. EXAM: CHEST - 2 VIEW COMPARISON:  07/19/2023. FINDINGS: The heart is enlarged and the mediastinal contour is within normal limits. Atherosclerotic calcification of the aorta is noted. Mild atelectasis or scarring is present at the left lung base. No consolidation, effusion, or pneumothorax is seen. No acute osseous abnormality. A vascular stent is noted in the axillary region on the left. IMPRESSION: No active cardiopulmonary disease. Electronically Signed   By: Leita Birmingham M.D.   On: 01/22/2024 18:17   VAS US  DUPLEX DIALYSIS ACCESS (AVF, AVG) Result Date: 01/19/2024 DIALYSIS ACCESS Patient Name:  Jeffery Joseph  Date of Exam:   01/18/2024  Medical Rec #: [id number]      Accession #:    [id number] Date of Birth: Apr 07, 1940      Patient Gender: M Patient Age:   12 years Exam Location:  [facility] Procedure:      VAS US  DUPLEX DIALYSIS ACCESS (AVF, AVG) Referring Phys: SELINDA GU --------------------------------------------------------------------------------  Reason for Exam: Routine  follow up. Access Site: Left Upper Extremity. Access Type: Brachial-cephalic AVF. History: 2023: Left brachial-cephalic AVF created;          06/08/23: Stent placed at cephalic / subclavian confluence;          09/2023: mid upper arm PTA and stent;. Performing Technologist: Elsie Churn RT, RDMS, RVT  Examination Guidelines: A complete evaluation includes B-mode imaging, spectral Doppler, color Doppler, and power Doppler as needed of all accessible portions of each vessel. Unilateral testing is considered an integral part of a complete examination. Limited examinations for reoccurring indications may be performed as noted.  Findings: +--------------------+----------+-----------------+--------+ AVF                 PSV (cm/s)Flow Vol (mL/min)Comments +--------------------+----------+-----------------+--------+ Native artery inflow   196          1496                +--------------------+----------+-----------------+--------+ AVF Anastomosis        418                              +--------------------+----------+-----------------+--------+  +---------------+----------+-------------+----------+-----------------------+ OUTFLOW VEIN   PSV (cm/s)Diameter (cm)Depth (cm)       Describe         +---------------+----------+-------------+----------+-----------------------+ Subclavian vein   194                                                   +---------------+----------+-------------+----------+-----------------------+ Confluence        129                                    stent           +---------------+----------+-------------+----------+-----------------------+ Clavicle          139                                    stent          +---------------+----------+-------------+----------+-----------------------+ Shoulder          152                                    stent          +---------------+----------+-------------+----------+-----------------------+ Prox UA           133                                    stent          +---------------+----------+-------------+----------+-----------------------+ Mid UA            150                                    stent          +---------------+----------+-------------+----------+-----------------------+ Dist UA           325  possible retained valve +---------------+----------+-------------+----------+-----------------------+ AC Fossa          119                                                   +---------------+----------+-------------+----------+-----------------------+  Summary: Patent left brachial-cephalic AVF with no focal hemodynamically significant velocity increases or internal vessel narrowing. Normal Flow Volume noted.  *See table(s) above for measurements and observations.  Diagnosing physician: Selinda Gu MD Electronically signed by Selinda Gu MD on 01/19/2024 at 7:24:41 AM.  --------------------------------------------------------------------------------   Final     Microbiology Recent Results (from the past 240 hours)  Aerobic/Anaerobic Culture w Gram Stain (surgical/deep wound)     Status: None   Collection Time: 01/26/24 12:27 PM   Specimen: Abscess  Result Value Ref Range Status   Specimen Description   Final    ABSCESS Performed at [facility], [address], [place], [place] [address]    Special Requests   Final    Normal WD Performed at [facility], [address] [place], [place] [address]    Gram Stain   Final    MODERATE WBC  PRESENT, PREDOMINANTLY PMN MODERATE GRAM POSITIVE COCCI    Culture   Final    ABUNDANT KLEBSIELLA PNEUMONIAE ABUNDANT ENTEROCOCCUS FAECALIS NO ANAEROBES ISOLATED Performed at [facility], [address], [place], [place] [address]    Report Status 01/31/2024 FINAL  Final   Organism ID, Bacteria KLEBSIELLA PNEUMONIAE  Final   Organism ID, Bacteria ENTEROCOCCUS FAECALIS  Final      Susceptibility   Enterococcus faecalis - MIC*    AMPICILLIN  <=2 SENSITIVE Sensitive     VANCOMYCIN  1 SENSITIVE Sensitive     GENTAMICIN SYNERGY SENSITIVE Sensitive     * ABUNDANT ENTEROCOCCUS FAECALIS   Klebsiella pneumoniae - MIC*    AMPICILLIN  >=32 RESISTANT Resistant     CEFAZOLIN  (NON-URINE) 4 INTERMEDIATE Intermediate     CEFEPIME  <=0.12 SENSITIVE Sensitive     ERTAPENEM <=0.12 SENSITIVE Sensitive     CEFTRIAXONE  <=0.25 SENSITIVE Sensitive     CIPROFLOXACIN 0.12 SENSITIVE Sensitive     GENTAMICIN <=1 SENSITIVE Sensitive     MEROPENEM <=0.25 SENSITIVE Sensitive     TRIMETH/SULFA <=20 SENSITIVE Sensitive     AMPICILLIN /SULBACTAM 8 SENSITIVE Sensitive     PIP/TAZO Value in next row Sensitive      8 SENSITIVEThis is a modified FDA-approved test that has been validated and its performance characteristics determined by the reporting laboratory.  This laboratory is certified under the Clinical Laboratory Improvement Amendments CLIA as qualified to perform high complexity clinical laboratory testing.    * ABUNDANT KLEBSIELLA PNEUMONIAE  Culture, blood (Routine X 2) w Reflex to ID Panel     Status: None   Collection Time: 01/27/24  5:05 AM   Specimen: BLOOD  Result Value Ref Range Status   Specimen Description BLOOD BLOOD RIGHT HAND  Final   Special Requests   Final    BOTTLES DRAWN AEROBIC AND ANAEROBIC Blood Culture adequate volume   Culture   Final    NO GROWTH 5 DAYS Performed at [facility], [address] [place], [place] [address]    Report Status 02/01/2024 FINAL  Final   Culture, blood (Routine X 2) w Reflex to ID Panel     Status: None   Collection  Time: 01/27/24  5:09 AM   Specimen: BLOOD  Result Value Ref Range Status   Specimen Description BLOOD BLOOD RIGHT HAND  Final   Special Requests   Final    BOTTLES DRAWN AEROBIC AND ANAEROBIC Blood Culture adequate volume   Culture   Final    NO GROWTH 5 DAYS Performed at [facility], [address] [place], [place] [address]    Report Status 02/01/2024 FINAL  Final    Lab Basic Metabolic Panel: Recent Labs  Lab 01/29/24 0441 01/30/24 0420 01/31/24 0823 02/01/24 0524 02/02/24 0510 02/03/24 0500  NA 132* 129* 128* 132* 132* 131*  K 4.2 4.1 4.4 4.0 3.9 4.1  CL 95* 93* 92* 95* 95* 93*  CO2 25 25 24 26 25 25   GLUCOSE 164* 205* 189* 174* 112* 116*  BUN 26* 39* 56* 46* 58* 39*  CREATININE 2.98* 3.59* 4.08* 3.32* 3.87* 3.00*  CALCIUM  8.7* 9.2 9.0 8.7* 8.6* 8.3*  MG 2.2  --   --  2.3 2.4 2.2  PHOS  --   --  4.7*  --  3.8 3.5   Liver Function Tests: Recent Labs  Lab 01/31/24 0823 02/03/24 1951  AST  --  15  ALT  --  21  ALKPHOS  --  66  BILITOT  --  0.3  PROT  --  6.6  ALBUMIN  3.1* 3.2*   No results for input(s): LIPASE, AMYLASE in the last 168 hours. No results for input(s): AMMONIA in the last 168 hours. CBC: Recent Labs  Lab 01/30/24 0900 01/31/24 0549 02/01/24 0524 02/02/24 0510 02/03/24 0500  WBC 12.9* 10.7* 9.9 9.9 7.6  HGB 8.3* 8.3* 8.0* 8.3* 8.2*  HCT 26.4* 26.6* 25.9* 27.0* 26.7*  MCV 79.3* 79.2* 80.4 80.6 82.2  PLT 248 255 242 225 216   Cardiac Enzymes: No results for input(s): CKTOTAL, CKMB, CKMBINDEX, TROPONINI in the last 168 hours. Sepsis Labs: Recent Labs  Lab 01/29/24 1148 01/30/24 0420 01/31/24 0549 02/01/24 0524 02/02/24 0510 02/02/24 1433 02/03/24 0500  WBC  --    < > 10.7* 9.9 9.9  --  7.6  LATICACIDVEN 1.0  --   --   --   --  1.7  --    < > = values in this interval not displayed.    Procedures/Operations  1/14:  Splenic drain placed by IR (removed on 1/19) 1/15: Central line placed     Inge Lecher, AGACNP-BC [facility] Prefer epic messenger for cross cover needs If after hours, please call E-link  Inge JONETTA Lecher February 13, 2024, 1:48 PM   "

## 2024-02-13 NOTE — Progress Notes (Signed)
 Brief cardiology note not for billing purposes:  Patient transitioned to comfort care. Cardiology will sign off but please let us  know if there are any questions.   SignedBETHA Danita Bloch, PA-C March 05, 2024 6:56 AM

## 2024-02-13 DEATH — deceased

## 2024-02-16 LAB — COOXEMETRY PANEL
Carboxyhemoglobin: 2.1 % — ABNORMAL HIGH (ref 0.5–1.5)
Methemoglobin: 1 % (ref 0.0–1.5)
O2 Saturation: 98.3 %
Total oxygen content: 95.3 %

## 2024-04-20 ENCOUNTER — Ambulatory Visit (INDEPENDENT_AMBULATORY_CARE_PROVIDER_SITE_OTHER): Admitting: Nurse Practitioner

## 2024-04-20 ENCOUNTER — Encounter (INDEPENDENT_AMBULATORY_CARE_PROVIDER_SITE_OTHER)

## 2024-05-02 ENCOUNTER — Ambulatory Visit (INDEPENDENT_AMBULATORY_CARE_PROVIDER_SITE_OTHER): Admitting: Vascular Surgery

## 2024-05-02 ENCOUNTER — Encounter (INDEPENDENT_AMBULATORY_CARE_PROVIDER_SITE_OTHER)

## 2024-10-04 ENCOUNTER — Ambulatory Visit: Admitting: Urology

## 2024-10-10 ENCOUNTER — Ambulatory Visit: Admitting: Urology
